# Patient Record
Sex: Female | Born: 1945 | Race: White | Hispanic: No | Smoking: Never smoker
Health system: Southern US, Community
[De-identification: ages and names within clinical notes are randomized; demographics above are authoritative.]

## PROBLEM LIST (undated history)

## (undated) DIAGNOSIS — J3089 Other allergic rhinitis: Secondary | ICD-10-CM

## (undated) DIAGNOSIS — Z923 Personal history of irradiation: Secondary | ICD-10-CM

## (undated) DIAGNOSIS — I10 Essential (primary) hypertension: Secondary | ICD-10-CM

## (undated) DIAGNOSIS — Z8041 Family history of malignant neoplasm of ovary: Secondary | ICD-10-CM

## (undated) DIAGNOSIS — E119 Type 2 diabetes mellitus without complications: Secondary | ICD-10-CM

## (undated) DIAGNOSIS — C541 Malignant neoplasm of endometrium: Secondary | ICD-10-CM

## (undated) DIAGNOSIS — Z973 Presence of spectacles and contact lenses: Secondary | ICD-10-CM

## (undated) DIAGNOSIS — E538 Deficiency of other specified B group vitamins: Secondary | ICD-10-CM

## (undated) DIAGNOSIS — H269 Unspecified cataract: Secondary | ICD-10-CM

## (undated) DIAGNOSIS — E039 Hypothyroidism, unspecified: Secondary | ICD-10-CM

## (undated) DIAGNOSIS — E785 Hyperlipidemia, unspecified: Secondary | ICD-10-CM

## (undated) DIAGNOSIS — Z1379 Encounter for other screening for genetic and chromosomal anomalies: Principal | ICD-10-CM

## (undated) DIAGNOSIS — Z86718 Personal history of other venous thrombosis and embolism: Secondary | ICD-10-CM

## (undated) DIAGNOSIS — Z800 Family history of malignant neoplasm of digestive organs: Secondary | ICD-10-CM

## (undated) DIAGNOSIS — E559 Vitamin D deficiency, unspecified: Secondary | ICD-10-CM

## (undated) DIAGNOSIS — K429 Umbilical hernia without obstruction or gangrene: Secondary | ICD-10-CM

## (undated) DIAGNOSIS — K219 Gastro-esophageal reflux disease without esophagitis: Secondary | ICD-10-CM

## (undated) HISTORY — DX: Family history of malignant neoplasm of digestive organs: Z80.0

## (undated) HISTORY — DX: Encounter for other screening for genetic and chromosomal anomalies: Z13.79

## (undated) HISTORY — DX: Unspecified cataract: H26.9

## (undated) HISTORY — DX: Personal history of irradiation: Z92.3

## (undated) HISTORY — DX: Essential (primary) hypertension: I10

## (undated) HISTORY — DX: Vitamin D deficiency, unspecified: E55.9

## (undated) HISTORY — DX: Family history of malignant neoplasm of ovary: Z80.41

## (undated) HISTORY — DX: Hyperlipidemia, unspecified: E78.5

---

## 1998-10-03 ENCOUNTER — Other Ambulatory Visit: Admission: RE | Admit: 1998-10-03 | Discharge: 1998-10-03 | Payer: Self-pay | Admitting: Family Medicine

## 2003-07-02 HISTORY — PX: LAPAROSCOPIC CHOLECYSTECTOMY: SUR755

## 2007-07-02 HISTORY — PX: BREAST BIOPSY: SHX20

## 2009-06-02 ENCOUNTER — Encounter (INDEPENDENT_AMBULATORY_CARE_PROVIDER_SITE_OTHER): Payer: Self-pay | Admitting: Orthopedic Surgery

## 2009-06-02 ENCOUNTER — Ambulatory Visit: Payer: Self-pay | Admitting: Surgery

## 2009-06-02 ENCOUNTER — Ambulatory Visit: Admission: RE | Admit: 2009-06-02 | Discharge: 2009-06-02 | Payer: Self-pay | Admitting: Orthopedic Surgery

## 2009-06-14 ENCOUNTER — Ambulatory Visit: Payer: Self-pay | Admitting: Vascular Surgery

## 2009-07-01 HISTORY — PX: VEIN SURGERY: SHX48

## 2009-09-15 ENCOUNTER — Ambulatory Visit: Payer: Self-pay | Admitting: Vascular Surgery

## 2009-09-27 ENCOUNTER — Ambulatory Visit: Payer: Self-pay | Admitting: Vascular Surgery

## 2009-10-06 ENCOUNTER — Ambulatory Visit: Payer: Self-pay | Admitting: Vascular Surgery

## 2010-11-13 NOTE — Assessment & Plan Note (Signed)
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  She has had no marked  progression in the hemosiderin deposit and changes of chronic venous  insufficiency in her right pretibial area.  She reports that she  continues to have discomfort despite the compression garment used and  has been very difficult for her to wear compression garments.  These  cause some discomfort and do not fit her well due to her obesity.  She  reports that housework with cooking and other chores is difficult due to  leg pain and swelling.  She also has difficulty caring to her husband,  who has had recent severe health issues due to leg pain and swelling  when being up and around and also makes automobile travel difficulty due  to pain and swelling with prolonged riding.   PHYSICAL EXAMINATION:  Unchanged.  Her health history is unchanged as  well.  Blood pressure today is 152/86, pulse 84, respirations 18.  She  is in no acute distress.  Musculoskeletal:  Without major deformities or  cyanosis.  Neurologic without focal deficits or paresthesias.  She does  have palpable dorsalis pedis pulses.   I re-imaged her vein with SonoSite Ultrasound, again showed a markedly  enlarged saphenous vein feeding into the varicosities in her calf.   I have recommend that we proceed with laser ablation of her right great  saphenous vein and stab phlebectomy of her multiple tributary  varicosities for her symptom relief, since she has clearly failed  conservative treatment.  She wishes to schedule this at her earliest  convenience.     Larina Earthly, M.D.  Electronically Signed   TFE/MEDQ  D:  09/15/2009  T:  09/18/2009  Job:  [id number]   cc:   Marlowe Kays, M.D.

## 2010-11-13 NOTE — Consult Note (Signed)
NEW PATIENT CONSULTATION   Alexandria Irwin, Alexandria Irwin  DOB:  11-07-1945                                       06/14/2009  CHART#:[id number]   The patient presents today for evaluation of pain in her right leg.  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  She does have a history of  superficial thrombophlebitis in her pretibial area approximately 10  years ago.  She has had progressive changes over the past several years  and reports that she does now have pain specifically over these areas.  She does not have any history of bleeding.  She does have swelling and  pain with prolonged standing.  She does have some arthritic difficulty  as well.   PAST MEDICAL HISTORY:  Significant for non-insulin-dependent diabetes,  hypothyroidism and hypertension.   PAST SURGICAL HISTORY:  Significant for cholecystectomy.   ALLERGIES:  Sulfa and Neosporin.   She does have discomfort with prolonged standing, specifically over  these varicosities and also an achy sensation from her knee distally.   FAMILY HISTORY:  Significant for extensive varicosities in her mother.   SOCIAL HISTORY:  She is married with 4 children.  She does not smoke or  drink alcohol.   REVIEW OF SYSTEMS:  Her weight is reported at 200 pounds.  She is 5 feet  3 inches tall.  She denies any weight loss or weight gain or change in  appetite.  CARDIAC:  Negative.  PULMONARY:  Negative.  GI:  Negative.  GU:  Negative.  VASCULAR:  Positive only for thrombophlebitis in the past.  NEUROLOGICAL:  Negative.  MUSCULOSKELETAL:  Negative.  PSYCHIATRIC:  Negative.  ENT:  Positive for change in her eyesight with dryness in her eyes.  No hematologic or skin issues.   PHYSICAL EXAMINATION:  A well-developed, moderately obese white female  appearing stated age of 65.  Blood pressure is 132/80, pulse 78,  respirations 14.  Her radial and dorsalis pedis pulses  are 2+  bilaterally.  She is in no acute distress.  HEENT:  Pupils equal, round  and reactive to light.  Extraocular movements are intact.  Neck:  Without JVD or cervical adenopathy.  Her musculoskeletal shows no major  deformities or cyanosis.  She does have marked saphenous vein tributary  varicosities throughout her calf with hemosiderin deposit in the  pretibial area.  Neurologically she is grossly intact.  Skin is without  ulcers or rashes other than the hemosiderin deposit.   She underwent noninvasive vascular laboratory study in our office and  this reveals gross reflux in her right great saphenous vein, also some  reflux in her right small saphenous vein.  She does not have any  evidence of deep venous thrombosis.  I discussed the significance of  this with the patient.  I reassured her this was not a limb threatening  issue.  I have recommended that we begin with graduated compression  garments for conservative treatment of her venous hypertension.  She was  fitted today with thigh high 20-30 mmHg compression and will begin the  use of these.  We will see her back in 3 months for continued  discussion.  I did explain the potential option for ablation of her  great saphenous vein should she have failure of  conservative treatment.  We will see her again in 3 months.     Larina Earthly, M.D.  Electronically Signed   TFE/MEDQ  D:  06/14/2009  T:  06/15/2009  Job:  [id number]   cc:   Marlowe Kays, M.D.  [facility]  Paulene Floor, FNP

## 2010-11-13 NOTE — Procedures (Signed)
LOWER EXTREMITY VENOUS REFLUX EXAM   INDICATION:  Right lower extremity varicose vein.   EXAM:  Using color-flow imaging and pulse Doppler spectral analysis, the  right common femoral, superficial femoral, popliteal, posterior tibial,  greater and lesser saphenous veins are evaluated.  There is evidence  suggesting deep venous insufficiency in the right lower extremity.   The right saphenofemoral junction is not competent with reflux of >500  milliseconds. The right GSV is not competent with reflux of >500  milliseconds with the caliber as described below.   The right proximal short saphenous vein demonstrates incompetency in the  mid distal calf and is tortuous.   GSV Diameter (used if found to be incompetent only)                                            Right    Left  Proximal Greater Saphenous Vein           0.9 cm   cm  Proximal-to-mid-thigh                     cm       cm  Mid thigh                                 0.89 cm  cm  Mid-distal thigh                          cm       cm  Distal thigh                              0.93 cm  cm  Knee                                      0.7 cm   cm   IMPRESSION:  1. Right greater saphenous vein reflux with >500 milliseconds is      identified with the caliber ranging from 0.7 cm to 0.99 cm knee to      groin.  2. In the distal thigh and proximal calf there appears to be frozen      valves in the right greater saphenous vein.  3. The right greater saphenous vein is not aneurysmal.  4. The right greater saphenous vein is not tortuous.  5. The deep venous system is not competent with reflux of >500      milliseconds.  6. The right lesser saphenous vein is not competent with reflux of      >500 milliseconds in the mid / distal calf.   ___________________________________________  Larina Earthly, M.D.   AS/MEDQ  D:  06/14/2009  T:  06/14/2009  Job:  [id number]

## 2010-11-13 NOTE — Assessment & Plan Note (Signed)
OFFICE VISIT   [name], Alexandria Irwin  DOB:  11-Jun-1946                                       09/27/2009  CHART#:[id number]   The patient underwent uneventful right laser ablation of great saphenous  vein from below the knee to the saphenofemoral junction and stab  phlebectomy of greater than 10 tributary varicosities in her medial and  posterior calf.  She had no immediate complications and was discharged  to home and will be seen again in 1 week for followup and duplex  evaluation.     Larina Earthly, M.D.  Electronically Signed   TFE/MEDQ  D:  09/27/2009  T:  09/28/2009  Job:  [id number]

## 2010-11-13 NOTE — Procedures (Signed)
DUPLEX DEEP VENOUS EXAM - LOWER EXTREMITY   INDICATION:  Right greater saphenous vein strip.   HISTORY:  Edema:  No.  Trauma/Surgery:  She did have the right greater saphenous vein stripped  a week ago.  Pain:  PE:  Previous DVT:  Yes.  Anticoagulants:  No.  Other:   DUPLEX EXAM:                CFV   SFV   PopV  PTV    GSV                R  L  R  L  R  L  R   L  R     L  Thrombosis    0  0  0     0     0      Closed  Spontaneous   +  +  +     +     +  Phasic        +  +  +     +     +  Augmentation  +  +  +     +     +  Compressible  +  +  +     +     +  Competent     +  +  +     +     +   Legend:  + - yes  o - no  p - partial  D - decreased   IMPRESSION:  1. No evidence of deep venous thrombosis in right leg.  2. The right greater saphenous vein was lasered from the groin to the      knee and appears to be closed.  3. The phlebectomy sites are stable.    _____________________________  Larina Earthly, M.D.   NT/MEDQ  D:  10/06/2009  T:  10/06/2009  Job:  [id number]

## 2010-11-13 NOTE — Assessment & Plan Note (Signed)
OFFICE VISIT   Alexandria, Irwin  DOB:  [birth year]                                       10/06/2009  CHART#:[id number]   Patient presents today in 1-week followup of her right great saphenous  vein laser ablation and stab phlebectomy on 03/30.  She has done quite  well with the usual amount of mild bruising and soreness in her medial  thigh.  Her phlebectomy sites are all healing quite nicely.   She has undergone repeat venous duplex in our office, and this reveals  no evidence of injury to her deep system and ablation of her right great  saphenous vein.  I am quite pleased with her result, as is patient.  I  plan to see her again on an as-needed basis.     Larina Earthly, M.D.  Electronically Signed   TFE/MEDQ  D:  10/06/2009  T:  10/10/2009  Job:  [id number]   cc:   Marlowe Kays, M.D.  [facility]

## 2011-07-02 HISTORY — PX: FOOT GANGLION EXCISION: SHX1660

## 2011-07-03 DIAGNOSIS — M999 Biomechanical lesion, unspecified: Secondary | ICD-10-CM | POA: Diagnosis not present

## 2011-07-03 DIAGNOSIS — M5137 Other intervertebral disc degeneration, lumbosacral region: Secondary | ICD-10-CM | POA: Diagnosis not present

## 2011-07-03 DIAGNOSIS — M9981 Other biomechanical lesions of cervical region: Secondary | ICD-10-CM | POA: Diagnosis not present

## 2011-07-04 DIAGNOSIS — M9981 Other biomechanical lesions of cervical region: Secondary | ICD-10-CM | POA: Diagnosis not present

## 2011-07-04 DIAGNOSIS — M5137 Other intervertebral disc degeneration, lumbosacral region: Secondary | ICD-10-CM | POA: Diagnosis not present

## 2011-07-04 DIAGNOSIS — M999 Biomechanical lesion, unspecified: Secondary | ICD-10-CM | POA: Diagnosis not present

## 2011-07-08 DIAGNOSIS — M5137 Other intervertebral disc degeneration, lumbosacral region: Secondary | ICD-10-CM | POA: Diagnosis not present

## 2011-07-08 DIAGNOSIS — M999 Biomechanical lesion, unspecified: Secondary | ICD-10-CM | POA: Diagnosis not present

## 2011-07-08 DIAGNOSIS — M9981 Other biomechanical lesions of cervical region: Secondary | ICD-10-CM | POA: Diagnosis not present

## 2011-07-10 DIAGNOSIS — M999 Biomechanical lesion, unspecified: Secondary | ICD-10-CM | POA: Diagnosis not present

## 2011-07-10 DIAGNOSIS — M9981 Other biomechanical lesions of cervical region: Secondary | ICD-10-CM | POA: Diagnosis not present

## 2011-07-10 DIAGNOSIS — M5137 Other intervertebral disc degeneration, lumbosacral region: Secondary | ICD-10-CM | POA: Diagnosis not present

## 2011-07-11 DIAGNOSIS — M5137 Other intervertebral disc degeneration, lumbosacral region: Secondary | ICD-10-CM | POA: Diagnosis not present

## 2011-07-11 DIAGNOSIS — M999 Biomechanical lesion, unspecified: Secondary | ICD-10-CM | POA: Diagnosis not present

## 2011-07-11 DIAGNOSIS — M9981 Other biomechanical lesions of cervical region: Secondary | ICD-10-CM | POA: Diagnosis not present

## 2011-07-15 DIAGNOSIS — M79609 Pain in unspecified limb: Secondary | ICD-10-CM | POA: Diagnosis not present

## 2011-07-15 DIAGNOSIS — N390 Urinary tract infection, site not specified: Secondary | ICD-10-CM | POA: Diagnosis not present

## 2011-07-16 DIAGNOSIS — M6740 Ganglion, unspecified site: Secondary | ICD-10-CM | POA: Diagnosis not present

## 2011-07-16 DIAGNOSIS — M79609 Pain in unspecified limb: Secondary | ICD-10-CM | POA: Diagnosis not present

## 2011-07-18 DIAGNOSIS — M5137 Other intervertebral disc degeneration, lumbosacral region: Secondary | ICD-10-CM | POA: Diagnosis not present

## 2011-07-18 DIAGNOSIS — M999 Biomechanical lesion, unspecified: Secondary | ICD-10-CM | POA: Diagnosis not present

## 2011-07-18 DIAGNOSIS — M9981 Other biomechanical lesions of cervical region: Secondary | ICD-10-CM | POA: Diagnosis not present

## 2011-08-06 DIAGNOSIS — M6740 Ganglion, unspecified site: Secondary | ICD-10-CM | POA: Diagnosis not present

## 2011-09-03 DIAGNOSIS — M79609 Pain in unspecified limb: Secondary | ICD-10-CM | POA: Diagnosis not present

## 2011-09-03 DIAGNOSIS — M6740 Ganglion, unspecified site: Secondary | ICD-10-CM | POA: Diagnosis not present

## 2011-09-24 DIAGNOSIS — N390 Urinary tract infection, site not specified: Secondary | ICD-10-CM | POA: Diagnosis not present

## 2011-09-24 DIAGNOSIS — E785 Hyperlipidemia, unspecified: Secondary | ICD-10-CM | POA: Diagnosis not present

## 2011-09-24 DIAGNOSIS — I10 Essential (primary) hypertension: Secondary | ICD-10-CM | POA: Diagnosis not present

## 2011-09-24 DIAGNOSIS — E559 Vitamin D deficiency, unspecified: Secondary | ICD-10-CM | POA: Diagnosis not present

## 2011-09-24 DIAGNOSIS — R7989 Other specified abnormal findings of blood chemistry: Secondary | ICD-10-CM | POA: Diagnosis not present

## 2011-09-24 DIAGNOSIS — E039 Hypothyroidism, unspecified: Secondary | ICD-10-CM | POA: Diagnosis not present

## 2011-09-24 DIAGNOSIS — E119 Type 2 diabetes mellitus without complications: Secondary | ICD-10-CM | POA: Diagnosis not present

## 2011-10-01 DIAGNOSIS — M6740 Ganglion, unspecified site: Secondary | ICD-10-CM | POA: Diagnosis not present

## 2011-10-01 DIAGNOSIS — M79609 Pain in unspecified limb: Secondary | ICD-10-CM | POA: Diagnosis not present

## 2011-10-10 DIAGNOSIS — Z6839 Body mass index (BMI) 39.0-39.9, adult: Secondary | ICD-10-CM | POA: Diagnosis not present

## 2011-10-10 DIAGNOSIS — E119 Type 2 diabetes mellitus without complications: Secondary | ICD-10-CM | POA: Diagnosis not present

## 2011-10-10 DIAGNOSIS — Z7982 Long term (current) use of aspirin: Secondary | ICD-10-CM | POA: Diagnosis not present

## 2011-10-10 DIAGNOSIS — I10 Essential (primary) hypertension: Secondary | ICD-10-CM | POA: Diagnosis not present

## 2011-10-10 DIAGNOSIS — E669 Obesity, unspecified: Secondary | ICD-10-CM | POA: Diagnosis not present

## 2011-10-10 DIAGNOSIS — M6740 Ganglion, unspecified site: Secondary | ICD-10-CM | POA: Diagnosis not present

## 2011-10-10 DIAGNOSIS — Z79899 Other long term (current) drug therapy: Secondary | ICD-10-CM | POA: Diagnosis not present

## 2011-10-10 DIAGNOSIS — E039 Hypothyroidism, unspecified: Secondary | ICD-10-CM | POA: Diagnosis not present

## 2011-11-08 DIAGNOSIS — N390 Urinary tract infection, site not specified: Secondary | ICD-10-CM | POA: Diagnosis not present

## 2011-12-26 DIAGNOSIS — E039 Hypothyroidism, unspecified: Secondary | ICD-10-CM | POA: Diagnosis not present

## 2011-12-26 DIAGNOSIS — E785 Hyperlipidemia, unspecified: Secondary | ICD-10-CM | POA: Diagnosis not present

## 2011-12-26 DIAGNOSIS — N390 Urinary tract infection, site not specified: Secondary | ICD-10-CM | POA: Diagnosis not present

## 2011-12-26 DIAGNOSIS — I10 Essential (primary) hypertension: Secondary | ICD-10-CM | POA: Diagnosis not present

## 2011-12-26 DIAGNOSIS — E119 Type 2 diabetes mellitus without complications: Secondary | ICD-10-CM | POA: Diagnosis not present

## 2012-02-28 DIAGNOSIS — N390 Urinary tract infection, site not specified: Secondary | ICD-10-CM | POA: Diagnosis not present

## 2012-03-30 DIAGNOSIS — E119 Type 2 diabetes mellitus without complications: Secondary | ICD-10-CM | POA: Diagnosis not present

## 2012-03-30 DIAGNOSIS — I10 Essential (primary) hypertension: Secondary | ICD-10-CM | POA: Diagnosis not present

## 2012-03-30 DIAGNOSIS — E785 Hyperlipidemia, unspecified: Secondary | ICD-10-CM | POA: Diagnosis not present

## 2012-03-30 DIAGNOSIS — N390 Urinary tract infection, site not specified: Secondary | ICD-10-CM | POA: Diagnosis not present

## 2012-03-30 DIAGNOSIS — E039 Hypothyroidism, unspecified: Secondary | ICD-10-CM | POA: Diagnosis not present

## 2012-04-20 DIAGNOSIS — Z23 Encounter for immunization: Secondary | ICD-10-CM | POA: Diagnosis not present

## 2012-06-04 DIAGNOSIS — H524 Presbyopia: Secondary | ICD-10-CM | POA: Diagnosis not present

## 2012-06-04 DIAGNOSIS — H2510 Age-related nuclear cataract, unspecified eye: Secondary | ICD-10-CM | POA: Diagnosis not present

## 2012-06-04 DIAGNOSIS — H00029 Hordeolum internum unspecified eye, unspecified eyelid: Secondary | ICD-10-CM | POA: Diagnosis not present

## 2012-06-04 DIAGNOSIS — E119 Type 2 diabetes mellitus without complications: Secondary | ICD-10-CM | POA: Diagnosis not present

## 2012-07-02 DIAGNOSIS — E559 Vitamin D deficiency, unspecified: Secondary | ICD-10-CM | POA: Diagnosis not present

## 2012-07-02 DIAGNOSIS — I10 Essential (primary) hypertension: Secondary | ICD-10-CM | POA: Diagnosis not present

## 2012-07-02 DIAGNOSIS — E039 Hypothyroidism, unspecified: Secondary | ICD-10-CM | POA: Diagnosis not present

## 2012-07-02 DIAGNOSIS — E785 Hyperlipidemia, unspecified: Secondary | ICD-10-CM | POA: Diagnosis not present

## 2012-07-02 DIAGNOSIS — E119 Type 2 diabetes mellitus without complications: Secondary | ICD-10-CM | POA: Diagnosis not present

## 2012-09-18 ENCOUNTER — Telehealth: Payer: Self-pay | Admitting: General Practice

## 2012-09-18 NOTE — Telephone Encounter (Signed)
appt made

## 2012-09-18 NOTE — Telephone Encounter (Signed)
uti wants to be seen today

## 2012-09-19 ENCOUNTER — Ambulatory Visit (INDEPENDENT_AMBULATORY_CARE_PROVIDER_SITE_OTHER): Payer: Medicare Other | Admitting: Family Medicine

## 2012-09-19 VITALS — BP 118/62 | HR 51 | Temp 97.0°F | Ht 63.0 in | Wt 210.0 lb

## 2012-09-19 DIAGNOSIS — R350 Frequency of micturition: Secondary | ICD-10-CM

## 2012-09-19 DIAGNOSIS — N390 Urinary tract infection, site not specified: Secondary | ICD-10-CM | POA: Diagnosis not present

## 2012-09-19 DIAGNOSIS — Z780 Asymptomatic menopausal state: Secondary | ICD-10-CM | POA: Diagnosis not present

## 2012-09-19 LAB — POCT URINALYSIS DIPSTICK
Bilirubin, UA: NEGATIVE
Glucose, UA: NEGATIVE
Nitrite, UA: NEGATIVE
Protein, UA: NEGATIVE
Spec Grav, UA: <=1.005
Urobilinogen, UA: NEGATIVE
pH, UA: 6.5

## 2012-09-19 MED ORDER — CIPROFLOXACIN HCL 500 MG PO TABS: 500.0000 mg | ORAL_TABLET | Freq: Two times a day (BID) | ORAL | Status: DC

## 2012-09-19 NOTE — Progress Notes (Signed)
Subjective:     Patient ID: Devonda Pequignot, female   DOB: [birth year], 67 y.o.   MRN: [id number]  HPI Patient comes in the Saturday morning urgent clinic here at [facility]. She's had the symptoms of UTI almost on a monthly basis as soon as she finishes antibiotics it starts again. On reviewing her chart they have been multiple visits for frequency or dysuria in the past year. No fever or chills. No vaginal discharge. She does give a history of early menopause at 40-35 years old. No flank pain. No history of kidney stones No history of GYN malignancy.  Past Medical History  Diagnosis Date  . Diabetes mellitus without complication   . Hypertension   . Thyroid disease   . Hyperlipidemia    Past Surgical History  Procedure Laterality Date  . Cholecystectomy    . Vein surgery     History   Social History  . Marital Status: Married    Spouse Name: N/A    Number of Children: N/A  . Years of Education: N/A   Occupational History  . Not on file.   Social History Main Topics  . Smoking status: Never Smoker   . Smokeless tobacco: Not on file  . Alcohol Use: No  . Drug Use: No  . Sexually Active: Not on file   Other Topics Concern  . Not on file   Social History Narrative  . No narrative on file   Family History  Problem Relation Age of Onset  . Cancer Mother     ovarian   No current outpatient prescriptions on file prior to visit.   No current facility-administered medications on file prior to visit.   Allergies  Allergen Reactions  . Ace Inhibitors   . Neosporin (Neomycin-Bacitracin Zn-Polymyx)   . Norvasc (Amlodipine)   . Sulfa Antibiotics    Immunization History  Administered Date(s) Administered  . Influenza Whole 08/02/2011   Prior to Admission medications   Medication Sig Start Date End Date Taking? Authorizing Provider  aspirin 325 MG tablet Take 325 mg by mouth daily.   Yes Historical Provider, MD  B Complex Vitamins (VITAMIN  B-COMPLEX PO) Take 1 tablet by mouth daily.   Yes Historical Provider, MD  Calcium Citrate-Vitamin D ([facility] CALCIUM CITRATE +D PO) Take 1 tablet by mouth daily.   Yes Historical Provider, MD  cholecalciferol (VITAMIN D) 1000 UNITS tablet Take 1,000 Units by mouth 2 (two) times daily.   Yes Historical Provider, MD  cloNIDine (CATAPRES) 0.1 MG tablet Take 0.1 mg by mouth 2 (two) times daily.   Yes Historical Provider, MD  furosemide (LASIX) 40 MG tablet Take 40 mg by mouth daily.   Yes Historical Provider, MD  levothyroxine (SYNTHROID, LEVOTHROID) 88 MCG tablet Take 88 mcg by mouth daily.   Yes Historical Provider, MD  losartan (COZAAR) 100 MG tablet Take 100 mg by mouth daily.   Yes Historical Provider, MD  Magnesium 250 MG TABS Take 1 tablet by mouth daily.   Yes Historical Provider, MD  metFORMIN (GLUCOPHAGE) 500 MG tablet Take 500 mg by mouth 2 (two) times daily with a meal.   Yes Historical Provider, MD  metoprolol (LOPRESSOR) 50 MG tablet Take 50 mg by mouth 2 (two) times daily.   Yes Historical Provider, MD  simvastatin (ZOCOR) 40 MG tablet Take 40 mg by mouth every evening.   Yes Historical Provider, MD  vitamin C (ASCORBIC ACID) 500 MG tablet Take 500 mg by mouth daily.  Yes Historical Provider, MD  ciprofloxacin (CIPRO) 500 MG tablet Take 1 tablet (500 mg total) by mouth 2 (two) times daily. 09/19/12   Ileana Ladd, MD    Review of Systems Review of all other systems was negative.     Objective:   Physical Exam On examination she appeared in good health and spirits. Well developed, well nourished. Obese Vital signs as documented. BP 118/62  Pulse 51  Temp(Src) 97 F (36.1 C) (Oral)  Wt 210 lb (95.255 kg)  Skin warm and dry and without overt rashes. Head & Neck without JVD. Lungs clear.  Heart exam notable for regular rhythm, normal sounds and absence of murmurs, rubs or gallops. Abdomen unremarkable and without evidence of organomegaly, masses, or abdominal aortic  enlargement.  Breast exam: not performed. Gyn Exam: Not performed. External Genitalia: Not performed Vagina:: Cervix: Uterus: Adnexae: R/V: Extremities nonedematous.    Assessment:     Frequency of urination - Plan: POCT urinalysis dipstick, Urine culture  UTI (urinary tract infection)     she does have a recurrent urinary tract infection. She may need suppressive therapy. Especially if the bacteria is growing in urine culture. However if the urine culture comes back negative as it has done on several occasions. Then 1 should consider that her problem may lie in atrophic vaginitis. And if indicated vaginal estrogen cream may improve her symptomatology. This may require a gynecologic exam in the near future. Discussed this with the patient. And she does see Claris Che here at [facility]. And was scheduled that if indicated. Plan:      Results for orders placed in visit on 09/19/12  POCT URINALYSIS DIPSTICK      Result Value Range   Color, UA yellow     Clarity, UA clear     Glucose, UA negative     Bilirubin, UA negative     Ketones, UA moderate     Spec Grav, UA <=1.005     Blood, UA moderate     pH, UA 6.5     Protein, UA negative     Urobilinogen, UA negative     Nitrite, UA negative     Leukocytes, UA moderate (2+)     Urine culture was sent out. Cipro 500 mg twice a day for 10 days was prescribed. Increase fluids await follow up of the urine culture.  Zarek Relph P. Modesto Charon, M.D.

## 2012-09-23 LAB — URINE CULTURE
Colony Count: NO GROWTH
Organism ID, Bacteria: NO GROWTH

## 2012-11-11 ENCOUNTER — Other Ambulatory Visit: Payer: Self-pay

## 2012-11-11 MED ORDER — CLONIDINE HCL 0.1 MG PO TABS: 0.1000 mg | ORAL_TABLET | Freq: Two times a day (BID) | ORAL | Status: DC

## 2012-11-11 MED ORDER — METFORMIN HCL 500 MG PO TABS: 500.0000 mg | ORAL_TABLET | Freq: Two times a day (BID) | ORAL | Status: DC

## 2012-11-11 MED ORDER — FUROSEMIDE 40 MG PO TABS: 40.0000 mg | ORAL_TABLET | Freq: Every day | ORAL | Status: DC

## 2012-11-11 MED ORDER — METOPROLOL TARTRATE 50 MG PO TABS: 50.0000 mg | ORAL_TABLET | Freq: Two times a day (BID) | ORAL | Status: DC

## 2012-11-20 ENCOUNTER — Telehealth: Payer: Self-pay | Admitting: Family Medicine

## 2012-11-20 ENCOUNTER — Encounter: Payer: Self-pay | Admitting: General Practice

## 2012-11-20 ENCOUNTER — Ambulatory Visit (INDEPENDENT_AMBULATORY_CARE_PROVIDER_SITE_OTHER): Payer: Medicare Other | Admitting: General Practice

## 2012-11-20 VITALS — BP 117/73 | HR 56 | Temp 97.6°F | Ht 61.0 in | Wt 208.0 lb

## 2012-11-20 DIAGNOSIS — N390 Urinary tract infection, site not specified: Secondary | ICD-10-CM

## 2012-11-20 DIAGNOSIS — R350 Frequency of micturition: Secondary | ICD-10-CM | POA: Diagnosis not present

## 2012-11-20 LAB — POCT URINALYSIS DIPSTICK
Bilirubin, UA: NEGATIVE
Ketones, UA: NEGATIVE
pH, UA: 7.5

## 2012-11-20 LAB — POCT UA - MICROSCOPIC ONLY
Bacteria, U Microscopic: NEGATIVE
Casts, Ur, LPF, POC: NEGATIVE

## 2012-11-20 MED ORDER — CIPROFLOXACIN HCL 500 MG PO TABS: 500.0000 mg | ORAL_TABLET | Freq: Two times a day (BID) | ORAL | Status: DC

## 2012-11-20 NOTE — Telephone Encounter (Signed)
Pt aware and appt made.

## 2012-11-20 NOTE — Progress Notes (Signed)
  Subjective:    Patient ID: Alexandria Irwin, female    DOB: December 22, 1945, 67 y.o.   MRN: [id number]  Urinary Tract Infection  This is a new problem. The current episode started 1 to 4 weeks ago. The problem occurs every urination. The problem has been gradually worsening. The quality of the pain is described as aching and burning. The pain is at a severity of 5/10. There has been no fever. She is not sexually active. There is no history of pyelonephritis. Associated symptoms include frequency and urgency. Pertinent negatives include no chills, discharge, flank pain or hematuria. Her past medical history is significant for recurrent UTIs. There is no history of kidney stones or urinary stasis.      Review of Systems  Constitutional: Negative for fever and chills.  Respiratory: Negative for chest tightness and shortness of breath.   Cardiovascular: Negative for chest pain and palpitations.  Gastrointestinal: Negative for abdominal pain.  Genitourinary: Positive for urgency and frequency. Negative for hematuria, flank pain and difficulty urinating.  Musculoskeletal: Negative for back pain.  Skin: Negative for rash.  Neurological: Negative for dizziness and headaches.  Psychiatric/Behavioral: Negative.        Objective:   Physical Exam  Constitutional: She is oriented to person, place, and time. She appears well-developed and well-nourished.  Cardiovascular: Normal rate, regular rhythm and normal heart sounds.   Pulmonary/Chest: Effort normal and breath sounds normal. No respiratory distress. She exhibits no tenderness.  Abdominal: Soft. Bowel sounds are normal. She exhibits no distension and no mass. There is no tenderness. There is no rebound and no guarding.  Neurological: She is alert and oriented to person, place, and time.  Skin: Skin is warm and dry.  Psychiatric: She has a normal mood and affect.          Assessment & Plan:  Urinary frequency - Plan: POCT urinalysis dipstick, POCT  UA - Microscopic Only  UTI (urinary tract infection) - Plan: ciprofloxacin (CIPRO) 500 MG tablet  Increase fluid intake AZO over the counter X2 days Frequent voiding Proper perineal hygiene RTO prn Culture pending Patient verbalized understanding Coralie Keens, FNP-C

## 2012-11-20 NOTE — Patient Instructions (Addendum)
Urinary Tract Infection  Urinary tract infections (UTIs) can develop anywhere along your urinary tract. Your urinary tract is your body's drainage system for removing wastes and extra water. Your urinary tract includes two kidneys, two ureters, a bladder, and a urethra. Your kidneys are a pair of bean-shaped organs. Each kidney is about the size of your fist. They are located below your ribs, one on each side of your spine.  CAUSES  Infections are caused by microbes, which are microscopic organisms, including fungi, viruses, and bacteria. These organisms are so small that they can only be seen through a microscope. Bacteria are the microbes that most commonly cause UTIs.  SYMPTOMS   Symptoms of UTIs may vary by age and gender of the patient and by the location of the infection. Symptoms in young women typically include a frequent and intense urge to urinate and a painful, burning feeling in the bladder or urethra during urination. Older women and men are more likely to be tired, shaky, and weak and have muscle aches and abdominal pain. A fever may mean the infection is in your kidneys. Other symptoms of a kidney infection include pain in your back or sides below the ribs, nausea, and vomiting.  DIAGNOSIS  To diagnose a UTI, your caregiver will ask you about your symptoms. Your caregiver also will ask to provide a urine sample. The urine sample will be tested for bacteria and white blood cells. White blood cells are made by your body to help fight infection.  TREATMENT   Typically, UTIs can be treated with medication. Because most UTIs are caused by a bacterial infection, they usually can be treated with the use of antibiotics. The choice of antibiotic and length of treatment depend on your symptoms and the type of bacteria causing your infection.  HOME CARE INSTRUCTIONS   If you were prescribed antibiotics, take them exactly as your caregiver instructs you. Finish the medication even if you feel better after you  have only taken some of the medication.   Drink enough water and fluids to keep your urine clear or pale yellow.   Avoid caffeine, tea, and carbonated beverages. They tend to irritate your bladder.   Empty your bladder often. Avoid holding urine for long periods of time.   Empty your bladder before and after sexual intercourse.   After a bowel movement, women should cleanse from front to back. Use each tissue only once.  SEEK MEDICAL CARE IF:    You have back pain.   You develop a fever.   Your symptoms do not begin to resolve within 3 days.  SEEK IMMEDIATE MEDICAL CARE IF:    You have severe back pain or lower abdominal pain.   You develop chills.   You have nausea or vomiting.   You have continued burning or discomfort with urination.  MAKE SURE YOU:    Understand these instructions.   Will watch your condition.   Will get help right away if you are not doing well or get worse.  Document Released: 03/27/2005 Document Revised: 12/17/2011 Document Reviewed: 07/26/2011  Exit[employer] Patient Information 2014 [employer].

## 2012-12-01 ENCOUNTER — Other Ambulatory Visit: Payer: Self-pay | Admitting: *Deleted

## 2012-12-01 MED ORDER — SIMVASTATIN 40 MG PO TABS: 40.0000 mg | ORAL_TABLET | Freq: Every evening | ORAL | Status: DC

## 2012-12-03 ENCOUNTER — Ambulatory Visit (INDEPENDENT_AMBULATORY_CARE_PROVIDER_SITE_OTHER): Payer: Medicare Other | Admitting: Physician Assistant

## 2012-12-03 ENCOUNTER — Encounter: Payer: Self-pay | Admitting: Physician Assistant

## 2012-12-03 ENCOUNTER — Encounter (INDEPENDENT_AMBULATORY_CARE_PROVIDER_SITE_OTHER): Payer: Medicare Other | Admitting: Vascular Surgery

## 2012-12-03 VITALS — BP 128/73 | HR 59 | Temp 98.2°F | Ht 61.0 in | Wt 210.0 lb

## 2012-12-03 DIAGNOSIS — R609 Edema, unspecified: Secondary | ICD-10-CM

## 2012-12-03 DIAGNOSIS — E785 Hyperlipidemia, unspecified: Secondary | ICD-10-CM

## 2012-12-03 DIAGNOSIS — E039 Hypothyroidism, unspecified: Secondary | ICD-10-CM

## 2012-12-03 DIAGNOSIS — M7989 Other specified soft tissue disorders: Secondary | ICD-10-CM | POA: Diagnosis not present

## 2012-12-03 DIAGNOSIS — E1159 Type 2 diabetes mellitus with other circulatory complications: Secondary | ICD-10-CM | POA: Insufficient documentation

## 2012-12-03 DIAGNOSIS — E119 Type 2 diabetes mellitus without complications: Secondary | ICD-10-CM | POA: Diagnosis not present

## 2012-12-03 DIAGNOSIS — E1169 Type 2 diabetes mellitus with other specified complication: Secondary | ICD-10-CM | POA: Insufficient documentation

## 2012-12-03 DIAGNOSIS — R600 Localized edema: Secondary | ICD-10-CM

## 2012-12-03 DIAGNOSIS — I10 Essential (primary) hypertension: Secondary | ICD-10-CM

## 2012-12-03 NOTE — Patient Instructions (Signed)

## 2012-12-03 NOTE — Progress Notes (Signed)
Subjective:     Patient ID: Alexandria Irwin, female   DOB: 1946-06-25, 67 y.o.   MRN: [id number]  HPI Pt with pain and swelling to the R lower leg Pt with prev hx of DVT and concerned about one again She has prev been given rx for TED hose but she never got it filled Denies numbness to the foot  Review of Systems  All other systems reviewed and are negative.       Objective:   Physical Exam  Nursing note and vitals reviewed.  Heart- RRR w/o M Lungs- CTA Lower ext- ++ pitting edema to the R lower leg to mid tib, early skin breakdown noted, + TTP post calf,good pulses distal    Assessment:     1. HTN (hypertension)   2. Diabetes   3. Unspecified hypothyroidism   4. Other and unspecified hyperlipidemia   5. Lower extremity edema        Plan:     [place] to r/o DVT Rx given for TED hose Keep up regular activities as tol F/U pending study

## 2012-12-10 ENCOUNTER — Other Ambulatory Visit: Payer: Self-pay | Admitting: Family Medicine

## 2012-12-10 ENCOUNTER — Telehealth: Payer: Self-pay | Admitting: Physician Assistant

## 2012-12-12 ENCOUNTER — Ambulatory Visit (INDEPENDENT_AMBULATORY_CARE_PROVIDER_SITE_OTHER): Payer: Medicare Other | Admitting: General Practice

## 2012-12-12 VITALS — BP 125/72 | HR 61 | Temp 98.0°F | Ht 61.0 in | Wt 210.0 lb

## 2012-12-12 DIAGNOSIS — L02419 Cutaneous abscess of limb, unspecified: Secondary | ICD-10-CM

## 2012-12-12 DIAGNOSIS — L03119 Cellulitis of unspecified part of limb: Secondary | ICD-10-CM | POA: Diagnosis not present

## 2012-12-12 DIAGNOSIS — L03115 Cellulitis of right lower limb: Secondary | ICD-10-CM

## 2012-12-12 MED ORDER — CEPHALEXIN 500 MG PO CAPS: 500.0000 mg | ORAL_CAPSULE | Freq: Two times a day (BID) | ORAL | Status: DC

## 2012-12-12 NOTE — Patient Instructions (Signed)
Cellulitis Cellulitis is an infection of the skin and the tissue beneath it. The infected area is usually red and tender. Cellulitis occurs most often in the arms and lower legs.  CAUSES  Cellulitis is caused by bacteria that enter the skin through cracks or cuts in the skin. The most common types of bacteria that cause cellulitis are Staphylococcus and Streptococcus. SYMPTOMS   Redness and warmth.  Swelling.  Tenderness or pain.  Fever. DIAGNOSIS  Your caregiver can usually determine what is wrong based on a physical exam. Blood tests may also be done. TREATMENT  Treatment usually involves taking an antibiotic medicine. HOME CARE INSTRUCTIONS   Take your antibiotics as directed. Finish them even if you start to feel better.  Keep the infected arm or leg elevated to reduce swelling.  Apply a warm cloth to the affected area up to 4 times per day to relieve pain.  Only take over-the-counter or prescription medicines for pain, discomfort, or fever as directed by your caregiver.  Keep all follow-up appointments as directed by your caregiver. SEEK MEDICAL CARE IF:   You notice red streaks coming from the infected area.  Your red area gets larger or turns dark in color.  Your bone or joint underneath the infected area becomes painful after the skin has healed.  Your infection returns in the same area or another area.  You notice a swollen bump in the infected area.  You develop new symptoms. SEEK IMMEDIATE MEDICAL CARE IF:   You have a fever.  You feel very sleepy.  You develop vomiting or diarrhea.  You have a general ill feeling (malaise) with muscle aches and pains. MAKE SURE YOU:   Understand these instructions.  Will watch your condition.  Will get help right away if you are not doing well or get worse. Document Released: 03/27/2005 Document Revised: 12/17/2011 Document Reviewed: 09/02/2011 Exit[employer] Patient Information 2014 [employer].  

## 2012-12-12 NOTE — Telephone Encounter (Signed)
Patient notified of results during appt on sat at after hours clinic

## 2012-12-12 NOTE — Progress Notes (Signed)
  Subjective:    Patient ID: Alexandria Irwin, female    DOB: August 10, 1945, 67 y.o.   MRN: [id number]  HPI Presents today with complaints of right lower leg swelling. Denies pain or drainage.     Review of Systems  Constitutional: Negative for fever and chills.  Respiratory: Negative for cough, chest tightness and shortness of breath.   Cardiovascular: Positive for leg swelling. Negative for chest pain and palpitations.       Right lower leg swelling  Genitourinary: Negative for dysuria, hematuria and difficulty urinating.  Neurological: Negative for dizziness, weakness and numbness.  Psychiatric/Behavioral: Negative.   All other systems reviewed and are negative.       Objective:   Physical Exam  Constitutional: She is oriented to person, place, and time. She appears well-developed and well-nourished.  Cardiovascular: Normal rate, regular rhythm and normal heart sounds.   Pulses:      Dorsalis pedis pulses are 1+ on the right side, and 1+ on the left side.  Pulmonary/Chest: Effort normal and breath sounds normal. No respiratory distress. She exhibits no tenderness.  Neurological: She is alert and oriented to person, place, and time.  Skin: Skin is warm and dry. There is erythema.  Erythema, edema and mild warmth noted to right shin area  Psychiatric: She has a normal mood and affect.          Assessment & Plan:  1. Cellulitis of leg, right - cephALEXin (KEFLEX) 500 MG capsule; Take 1 capsule (500 mg total) by mouth 2 (two) times daily.  Dispense: 14 capsule; Refill: 0 -elevate extremity  -keep area clean and dry -RTO if symptoms worsen and in one week for follow up -take medications even if feeling better -Patient verbalized understanding -Coralie Keens, FNP-C

## 2012-12-14 ENCOUNTER — Other Ambulatory Visit: Payer: Self-pay | Admitting: *Deleted

## 2012-12-14 MED ORDER — LOSARTAN POTASSIUM 100 MG PO TABS: 100.0000 mg | ORAL_TABLET | Freq: Every day | ORAL | Status: DC

## 2012-12-18 ENCOUNTER — Telehealth: Payer: Self-pay | Admitting: General Practice

## 2012-12-18 ENCOUNTER — Other Ambulatory Visit: Payer: Self-pay | Admitting: General Practice

## 2012-12-18 DIAGNOSIS — L0390 Cellulitis, unspecified: Secondary | ICD-10-CM

## 2012-12-18 MED ORDER — DOXYCYCLINE HYCLATE 100 MG PO TABS: 100.0000 mg | ORAL_TABLET | Freq: Two times a day (BID) | ORAL | Status: DC

## 2012-12-18 NOTE — Telephone Encounter (Signed)
Please inform patient that script called in and she will need to be seen if unresolved. thx

## 2012-12-18 NOTE — Telephone Encounter (Signed)
Mae to address 

## 2012-12-18 NOTE — Telephone Encounter (Signed)
Left details on patient's vm.

## 2013-01-05 ENCOUNTER — Other Ambulatory Visit: Payer: Self-pay | Admitting: *Deleted

## 2013-01-05 MED ORDER — LEVOTHYROXINE SODIUM 88 MCG PO TABS: 88.0000 ug | ORAL_TABLET | Freq: Every day | ORAL | Status: DC

## 2013-01-29 ENCOUNTER — Other Ambulatory Visit: Payer: Self-pay | Admitting: General Practice

## 2013-01-29 LAB — HM MAMMOGRAPHY

## 2013-02-03 ENCOUNTER — Other Ambulatory Visit: Payer: Self-pay

## 2013-02-12 ENCOUNTER — Other Ambulatory Visit: Payer: Self-pay | Admitting: Family Medicine

## 2013-02-18 ENCOUNTER — Encounter: Payer: Self-pay | Admitting: General Practice

## 2013-02-18 ENCOUNTER — Ambulatory Visit (INDEPENDENT_AMBULATORY_CARE_PROVIDER_SITE_OTHER): Payer: Medicare Other | Admitting: General Practice

## 2013-02-18 VITALS — BP 141/72 | HR 61 | Temp 97.7°F | Ht 61.0 in | Wt 209.0 lb

## 2013-02-18 DIAGNOSIS — E559 Vitamin D deficiency, unspecified: Secondary | ICD-10-CM

## 2013-02-18 DIAGNOSIS — I10 Essential (primary) hypertension: Secondary | ICD-10-CM

## 2013-02-18 DIAGNOSIS — E039 Hypothyroidism, unspecified: Secondary | ICD-10-CM | POA: Diagnosis not present

## 2013-02-18 DIAGNOSIS — E119 Type 2 diabetes mellitus without complications: Secondary | ICD-10-CM

## 2013-02-18 DIAGNOSIS — E785 Hyperlipidemia, unspecified: Secondary | ICD-10-CM

## 2013-02-18 DIAGNOSIS — Z09 Encounter for follow-up examination after completed treatment for conditions other than malignant neoplasm: Secondary | ICD-10-CM

## 2013-02-18 LAB — POCT CBC
HCT, POC: 40.8 % (ref 37.7–47.9)
Hemoglobin: 13.5 g/dL (ref 12.2–16.2)
MCH, POC: 30.9 pg (ref 27–31.2)
MCHC: 33.2 g/dL (ref 31.8–35.4)
MPV: 7.6 fL (ref 0–99.8)
POC Granulocyte: 3.3 (ref 2–6.9)
RBC: 4.4 M/uL (ref 4.04–5.48)

## 2013-02-18 LAB — POCT GLYCOSYLATED HEMOGLOBIN (HGB A1C): Hemoglobin A1C: 6.8

## 2013-02-18 MED ORDER — LEVOTHYROXINE SODIUM 88 MCG PO TABS: 88.0000 ug | ORAL_TABLET | Freq: Every day | ORAL | Status: DC

## 2013-02-18 MED ORDER — SIMVASTATIN 40 MG PO TABS: 40.0000 mg | ORAL_TABLET | Freq: Every day | ORAL | Status: DC

## 2013-02-18 MED ORDER — METFORMIN HCL 500 MG PO TABS: 500.0000 mg | ORAL_TABLET | Freq: Two times a day (BID) | ORAL | Status: DC

## 2013-02-18 NOTE — Progress Notes (Signed)
  Subjective:    Patient ID: Alexandria Irwin, female    DOB: Aug 31, 1945, 67 y.o.   MRN: [id number]  HPI Patient presents today for 3 month follow up of chronic conditions. She has a history of hypertension, hypothyroidism, diabetes, and hyperlipidemia. She reports taking medications as directed. Reports checking blood sugars 3-4 times weekly, ranging 130's-170's. Patient reports eating foods that aren't appropriate for diabetics, but this is something she is working hard to improve. Reports having a treadmill and is planning on starting a exercise routine.     Review of Systems  Constitutional: Negative for fever and chills.  HENT: Negative for ear pain, neck pain and neck stiffness.   Eyes: Negative for pain.  Respiratory: Negative for chest tightness and shortness of breath.   Cardiovascular: Negative for chest pain and palpitations.  Gastrointestinal: Negative for vomiting, abdominal pain, diarrhea and blood in stool.  Genitourinary: Negative for dysuria, hematuria and difficulty urinating.  Musculoskeletal: Negative for back pain.  Neurological: Negative for dizziness, weakness and headaches.       Objective:   Physical Exam  Constitutional: She is oriented to person, place, and time. She appears well-developed and well-nourished.  HENT:  Head: Normocephalic and atraumatic.  Right Ear: External ear normal.  Left Ear: External ear normal.  Nose: Nose normal.  Mouth/Throat: Oropharynx is clear and moist.  Eyes: EOM are normal. Pupils are equal, round, and reactive to light.  Neck: Normal range of motion. Neck supple. No thyromegaly present.  Cardiovascular: Normal rate, regular rhythm and normal heart sounds.   Pulmonary/Chest: Effort normal and breath sounds normal. No respiratory distress. She exhibits no tenderness.  Abdominal: Soft. Bowel sounds are normal. She exhibits no distension. There is no tenderness.  Musculoskeletal: She exhibits no edema and no tenderness.   Lymphadenopathy:    She has no cervical adenopathy.  Neurological: She is alert and oriented to person, place, and time.  Skin: Skin is warm and dry.  Psychiatric: She has a normal mood and affect.          Assessment & Plan:  1. Diabetes - POCT glycosylated hemoglobin (Hb A1C)  2. Hypertension - CMP14+EGFR  3. Other and unspecified hyperlipidemia - NMR, lipoprofile  4. Unspecified hypothyroidism - Thyroid Panel With TSH  5. Follow-up exam, 3-6 months since previous exam - POCT CBC  6. Unspecified vitamin D deficiency - Vit D  25 hydroxy (rtn osteoporosis monitoring) -Continue all current medications Labs pending F/u in 3 months and prn Discussed exercise and diet  Patient verbalized understanding Coralie Keens, FNP-C

## 2013-02-18 NOTE — Patient Instructions (Signed)

## 2013-02-19 LAB — CMP14+EGFR
ALT: 37 IU/L — ABNORMAL HIGH (ref 0–32)
Albumin: 4.3 g/dL (ref 3.6–4.8)
BUN: 13 mg/dL (ref 8–27)
CO2: 29 mmol/L (ref 18–29)
Calcium: 9.4 mg/dL (ref 8.6–10.2)
Chloride: 98 mmol/L (ref 97–108)
GFR calc Af Amer: 78 mL/min/{1.73_m2} (ref >59)
Glucose: 148 mg/dL — ABNORMAL HIGH (ref 65–99)
Potassium: 5 mmol/L (ref 3.5–5.2)
Total Bilirubin: 0.5 mg/dL (ref 0.0–1.2)
Total Protein: 6.7 g/dL (ref 6.0–8.5)

## 2013-02-19 LAB — VITAMIN D 25 HYDROXY (VIT D DEFICIENCY, FRACTURES): Vit D, 25-Hydroxy: 28.4 ng/mL — ABNORMAL LOW (ref 30.0–100.0)

## 2013-02-19 LAB — NMR, LIPOPROFILE
HDL Cholesterol by NMR: 41 mg/dL (ref >=40)
LDLC SERPL CALC-MCNC: 38 mg/dL (ref <100)
Small LDL Particle Number: 892 nmol/L — ABNORMAL HIGH (ref <=527)
Triglycerides by NMR: 182 mg/dL — ABNORMAL HIGH (ref <150)

## 2013-02-19 LAB — THYROID PANEL WITH TSH
Free Thyroxine Index: 2.1 (ref 1.2–4.9)
T4, Total: 7.2 ug/dL (ref 4.5–12.0)
TSH: 3.05 u[IU]/mL (ref 0.450–4.500)

## 2013-03-03 DIAGNOSIS — Z1231 Encounter for screening mammogram for malignant neoplasm of breast: Secondary | ICD-10-CM | POA: Diagnosis not present

## 2013-03-03 DIAGNOSIS — Z124 Encounter for screening for malignant neoplasm of cervix: Secondary | ICD-10-CM | POA: Diagnosis not present

## 2013-04-01 ENCOUNTER — Other Ambulatory Visit: Payer: Self-pay | Admitting: *Deleted

## 2013-04-01 DIAGNOSIS — E785 Hyperlipidemia, unspecified: Secondary | ICD-10-CM

## 2013-04-01 MED ORDER — SIMVASTATIN 40 MG PO TABS: 40.0000 mg | ORAL_TABLET | Freq: Every day | ORAL | Status: DC

## 2013-04-10 ENCOUNTER — Ambulatory Visit (INDEPENDENT_AMBULATORY_CARE_PROVIDER_SITE_OTHER): Payer: Medicare Other | Admitting: General Practice

## 2013-04-10 ENCOUNTER — Encounter: Payer: Self-pay | Admitting: General Practice

## 2013-04-10 VITALS — BP 109/73 | HR 56 | Temp 98.7°F | Ht 61.0 in | Wt 208.0 lb

## 2013-04-10 DIAGNOSIS — T148XXA Other injury of unspecified body region, initial encounter: Secondary | ICD-10-CM | POA: Diagnosis not present

## 2013-04-10 NOTE — Patient Instructions (Signed)
Muscle Strain  Muscle strain occurs when a muscle is stretched beyond its normal length. A small number of muscle fibers generally are torn. This is especially common in athletes. This happens when a sudden, violent force placed on a muscle stretches it too far. Usually, recovery from muscle strain takes 1 to 2 weeks. Complete healing will take 5 to 6 weeks.   HOME CARE INSTRUCTIONS    While awake, apply ice to the sore muscle for the first 2 days after the injury.   Put ice in a plastic bag.   Place a towel between your skin and the bag.   Leave the ice on for 15-20 minutes each hour.   Do not use the strained muscle for several days, until you no longer have pain.   You may wrap the injured area with an elastic bandage for comfort. Be careful not to wrap it too tightly. This may interfere with blood circulation or increase swelling.   Only take over-the-counter or prescription medicines for pain, discomfort, or fever as directed by your caregiver.  SEEK MEDICAL CARE IF:   You have increasing pain or swelling in the injured area.  MAKE SURE YOU:    Understand these instructions.   Will watch your condition.   Will get help right away if you are not doing well or get worse.  Document Released: 06/17/2005 Document Revised: 09/09/2011 Document Reviewed: 06/29/2011  [employer] Patient Information 2014 [employer].

## 2013-04-10 NOTE — Progress Notes (Signed)
  Subjective:    Patient ID: Alexandria Irwin, female    DOB: [birth year], 67 y.o.   MRN: [id number]  HPI Patient presents today with complaints of left upper leg pain. She reports feeling a pulling in her inner thigh muscle. She denies known injury or straining of muscle. Reports tylenol relieved her pain last night.     Review of Systems  Constitutional: Negative for fever and chills.  Respiratory: Negative for chest tightness and shortness of breath.   Cardiovascular: Negative for chest pain, palpitations and leg swelling.  Musculoskeletal: Positive for myalgias. Negative for back pain and joint swelling.       Objective:   Physical Exam  Constitutional: She is oriented to person, place, and time. She appears well-developed and well-nourished.  Cardiovascular: Regular rhythm and normal heart sounds.   Pulmonary/Chest: Effort normal and breath sounds normal.  Musculoskeletal: She exhibits tenderness. She exhibits no edema.  Tenderness noted to adductus magnus (left inner thigh) muscle, with palpation. Negative for swelling  Neurological: She is alert and oriented to person, place, and time.  Skin: Skin is warm and dry.  Psychiatric: She has a normal mood and affect.          Assessment & Plan:  1. Muscle strain -information sheet provided and discussed on muscle strain -ice as directed -rest -may continue tylenol as directed OTC or use motrin  -RTO if symptoms worsen or unresolved Patient verbalized understanding Coralie Keens, FNP-C

## 2013-05-04 ENCOUNTER — Ambulatory Visit (INDEPENDENT_AMBULATORY_CARE_PROVIDER_SITE_OTHER): Payer: Medicare Other

## 2013-05-04 DIAGNOSIS — Z23 Encounter for immunization: Secondary | ICD-10-CM

## 2013-06-09 ENCOUNTER — Other Ambulatory Visit: Payer: Self-pay | Admitting: Physician Assistant

## 2013-06-16 ENCOUNTER — Other Ambulatory Visit: Payer: Self-pay

## 2013-06-16 DIAGNOSIS — E119 Type 2 diabetes mellitus without complications: Secondary | ICD-10-CM

## 2013-06-16 MED ORDER — METFORMIN HCL 500 MG PO TABS: 500.0000 mg | ORAL_TABLET | Freq: Two times a day (BID) | ORAL | Status: DC

## 2013-06-16 MED ORDER — CLONIDINE HCL 0.1 MG PO TABS: 0.1000 mg | ORAL_TABLET | Freq: Two times a day (BID) | ORAL | Status: DC

## 2013-06-19 ENCOUNTER — Ambulatory Visit (INDEPENDENT_AMBULATORY_CARE_PROVIDER_SITE_OTHER): Payer: Medicare Other | Admitting: General Practice

## 2013-06-19 VITALS — BP 147/79 | HR 54 | Temp 97.1°F | Ht 61.0 in | Wt 202.0 lb

## 2013-06-19 DIAGNOSIS — J069 Acute upper respiratory infection, unspecified: Secondary | ICD-10-CM | POA: Diagnosis not present

## 2013-06-19 MED ORDER — ALBUTEROL SULFATE HFA 108 (90 BASE) MCG/ACT IN AERS: 2.0000 | INHALATION_SPRAY | Freq: Four times a day (QID) | RESPIRATORY_TRACT | Status: DC | PRN

## 2013-06-19 MED ORDER — AMOXICILLIN-POT CLAVULANATE 875-125 MG PO TABS: 1.0000 | ORAL_TABLET | Freq: Two times a day (BID) | ORAL | Status: DC

## 2013-06-19 NOTE — Patient Instructions (Signed)

## 2013-06-19 NOTE — Progress Notes (Signed)
   Subjective:    Patient ID: Alexandria Irwin, female    DOB: [birth year], 67 y.o.   MRN: [id number]  Cough This is a new problem. The current episode started in the past 7 days. The problem has been unchanged. The problem occurs every few minutes. The cough is productive of sputum. Associated symptoms include nasal congestion, postnasal drip and wheezing. Pertinent negatives include no chest pain, chills, fever, sore throat or shortness of breath. The symptoms are aggravated by lying down. She has tried nothing for the symptoms. There is no history of asthma, bronchitis, emphysema or pneumonia.      Review of Systems  Constitutional: Negative for fever and chills.  HENT: Positive for postnasal drip. Negative for sore throat.   Respiratory: Positive for cough and wheezing. Negative for chest tightness and shortness of breath.   Cardiovascular: Negative for chest pain and palpitations.  All other systems reviewed and are negative.       Objective:   Physical Exam  Constitutional: She is oriented to person, place, and time. She appears well-developed and well-nourished.  Cardiovascular: Normal rate, regular rhythm and normal heart sounds.   Pulmonary/Chest: Effort normal. She has wheezes in the right upper field and the left upper field. She exhibits no tenderness.  Neurological: She is alert and oriented to person, place, and time.  Skin: Skin is warm and dry.  Psychiatric: She has a normal mood and affect.          Assessment & Plan:  1. Upper respiratory infection - amoxicillin-clavulanate (AUGMENTIN) 875-125 MG per tablet; Take 1 tablet by mouth 2 (two) times daily.  Dispense: 20 tablet; Refill: 0 -continue to use albuterol inhaler as directed -increase fluids -avoid irritants -RTO if symptoms worsen or unresolved -Patient verbalized understanding Coralie Keens, FNP-C

## 2013-06-21 ENCOUNTER — Other Ambulatory Visit: Payer: Self-pay | Admitting: *Deleted

## 2013-06-21 MED ORDER — METOPROLOL TARTRATE 50 MG PO TABS: ORAL_TABLET | ORAL | Status: DC

## 2013-06-24 ENCOUNTER — Other Ambulatory Visit: Payer: Self-pay | Admitting: Nurse Practitioner

## 2013-07-01 ENCOUNTER — Other Ambulatory Visit: Payer: Self-pay | Admitting: Nurse Practitioner

## 2013-07-05 ENCOUNTER — Other Ambulatory Visit: Payer: Self-pay

## 2013-07-05 MED ORDER — FUROSEMIDE 40 MG PO TABS: 40.0000 mg | ORAL_TABLET | Freq: Every day | ORAL | Status: DC

## 2013-07-28 ENCOUNTER — Other Ambulatory Visit: Payer: Self-pay | Admitting: *Deleted

## 2013-07-28 DIAGNOSIS — E785 Hyperlipidemia, unspecified: Secondary | ICD-10-CM

## 2013-07-28 MED ORDER — SIMVASTATIN 40 MG PO TABS: 40.0000 mg | ORAL_TABLET | Freq: Every day | ORAL | Status: DC

## 2013-08-19 ENCOUNTER — Other Ambulatory Visit: Payer: Self-pay

## 2013-08-19 MED ORDER — CLONIDINE HCL 0.1 MG PO TABS: 0.1000 mg | ORAL_TABLET | Freq: Two times a day (BID) | ORAL | Status: DC

## 2013-09-17 ENCOUNTER — Other Ambulatory Visit: Payer: Self-pay | Admitting: *Deleted

## 2013-09-17 MED ORDER — METOPROLOL TARTRATE 50 MG PO TABS: ORAL_TABLET | ORAL | Status: DC

## 2013-09-18 ENCOUNTER — Emergency Department (HOSPITAL_COMMUNITY): Payer: Medicare Other

## 2013-09-18 ENCOUNTER — Ambulatory Visit (INDEPENDENT_AMBULATORY_CARE_PROVIDER_SITE_OTHER): Payer: Medicare Other | Admitting: Family Medicine

## 2013-09-18 ENCOUNTER — Encounter (HOSPITAL_COMMUNITY): Payer: Self-pay | Admitting: Emergency Medicine

## 2013-09-18 ENCOUNTER — Emergency Department (HOSPITAL_COMMUNITY)
Admission: EM | Admit: 2013-09-18 | Discharge: 2013-09-18 | Disposition: A | Discharge status: Home / Self Care | Payer: Medicare Other | Attending: Emergency Medicine | Admitting: Emergency Medicine

## 2013-09-18 VITALS — BP 131/71 | HR 72 | Temp 97.1°F | Ht 61.0 in | Wt 197.2 lb

## 2013-09-18 DIAGNOSIS — L02419 Cutaneous abscess of limb, unspecified: Secondary | ICD-10-CM | POA: Diagnosis not present

## 2013-09-18 DIAGNOSIS — R609 Edema, unspecified: Secondary | ICD-10-CM | POA: Diagnosis not present

## 2013-09-18 DIAGNOSIS — E119 Type 2 diabetes mellitus without complications: Secondary | ICD-10-CM | POA: Insufficient documentation

## 2013-09-18 DIAGNOSIS — I83893 Varicose veins of bilateral lower extremities with other complications: Secondary | ICD-10-CM | POA: Insufficient documentation

## 2013-09-18 DIAGNOSIS — E079 Disorder of thyroid, unspecified: Secondary | ICD-10-CM | POA: Insufficient documentation

## 2013-09-18 DIAGNOSIS — I878 Other specified disorders of veins: Secondary | ICD-10-CM

## 2013-09-18 DIAGNOSIS — I10 Essential (primary) hypertension: Secondary | ICD-10-CM | POA: Diagnosis not present

## 2013-09-18 DIAGNOSIS — R300 Dysuria: Secondary | ICD-10-CM | POA: Insufficient documentation

## 2013-09-18 DIAGNOSIS — I803 Phlebitis and thrombophlebitis of lower extremities, unspecified: Secondary | ICD-10-CM | POA: Diagnosis not present

## 2013-09-18 DIAGNOSIS — I872 Venous insufficiency (chronic) (peripheral): Secondary | ICD-10-CM | POA: Diagnosis not present

## 2013-09-18 DIAGNOSIS — Z7982 Long term (current) use of aspirin: Secondary | ICD-10-CM | POA: Insufficient documentation

## 2013-09-18 DIAGNOSIS — L03119 Cellulitis of unspecified part of limb: Secondary | ICD-10-CM

## 2013-09-18 DIAGNOSIS — M79609 Pain in unspecified limb: Secondary | ICD-10-CM | POA: Diagnosis not present

## 2013-09-18 DIAGNOSIS — Z9889 Other specified postprocedural states: Secondary | ICD-10-CM | POA: Diagnosis not present

## 2013-09-18 DIAGNOSIS — Z792 Long term (current) use of antibiotics: Secondary | ICD-10-CM | POA: Diagnosis not present

## 2013-09-18 DIAGNOSIS — Z79899 Other long term (current) drug therapy: Secondary | ICD-10-CM | POA: Diagnosis not present

## 2013-09-18 DIAGNOSIS — M7989 Other specified soft tissue disorders: Secondary | ICD-10-CM | POA: Diagnosis not present

## 2013-09-18 DIAGNOSIS — E785 Hyperlipidemia, unspecified: Secondary | ICD-10-CM | POA: Diagnosis not present

## 2013-09-18 LAB — URINALYSIS, ROUTINE W REFLEX MICROSCOPIC
Bilirubin Urine: NEGATIVE
Glucose, UA: NEGATIVE mg/dL
Hgb urine dipstick: NEGATIVE
KETONES UR: NEGATIVE mg/dL
LEUKOCYTES UA: NEGATIVE
NITRITE: NEGATIVE
Protein, ur: NEGATIVE mg/dL
SPECIFIC GRAVITY, URINE: 1.015 (ref 1.005–1.030)
Urobilinogen, UA: 0.2 mg/dL (ref 0.0–1.0)
pH: 6.5 (ref 5.0–8.0)

## 2013-09-18 LAB — CBC WITH DIFFERENTIAL/PLATELET
Basophils Absolute: 0 10*3/uL (ref 0.0–0.1)
Basophils Relative: 1 % (ref 0–1)
EOS PCT: 3 % (ref 0–5)
Eosinophils Absolute: 0.2 10*3/uL (ref 0.0–0.7)
HEMATOCRIT: 38.9 % (ref 36.0–46.0)
Hemoglobin: 13.4 g/dL (ref 12.0–15.0)
LYMPHS ABS: 2.4 10*3/uL (ref 0.7–4.0)
Lymphocytes Relative: 39 % (ref 12–46)
MCH: 32.1 pg (ref 26.0–34.0)
MCHC: 34.4 g/dL (ref 30.0–36.0)
MCV: 93.3 fL (ref 78.0–100.0)
Monocytes Absolute: 0.4 10*3/uL (ref 0.1–1.0)
Monocytes Relative: 6 % (ref 3–12)
NEUTROS ABS: 3.2 10*3/uL (ref 1.7–7.7)
Neutrophils Relative %: 52 % (ref 43–77)
Platelets: 208 10*3/uL (ref 150–400)
RBC: 4.17 MIL/uL (ref 3.87–5.11)
RDW: 13 % (ref 11.5–15.5)
WBC: 6.1 10*3/uL (ref 4.0–10.5)

## 2013-09-18 LAB — BASIC METABOLIC PANEL
BUN: 14 mg/dL (ref 6–23)
CALCIUM: 9.7 mg/dL (ref 8.4–10.5)
CHLORIDE: 98 meq/L (ref 96–112)
CO2: 33 meq/L — AB (ref 19–32)
Creatinine, Ser: 0.92 mg/dL (ref 0.50–1.10)
GFR calc Af Amer: 71 mL/min — ABNORMAL LOW (ref >90)
GFR calc non Af Amer: 62 mL/min — ABNORMAL LOW (ref >90)
GLUCOSE: 189 mg/dL — AB (ref 70–99)
Potassium: 4.3 mEq/L (ref 3.7–5.3)
Sodium: 142 mEq/L (ref 137–147)

## 2013-09-18 MED ORDER — DOXYCYCLINE HYCLATE 100 MG PO CAPS: 100.0000 mg | ORAL_CAPSULE | Freq: Two times a day (BID) | ORAL | Status: DC

## 2013-09-18 NOTE — Progress Notes (Signed)
Patient ID: Nioma Mccubbins, female   DOB: 1945/09/09, 68 y.o.   MRN: [id number] SUBJECTIVE: CC: Chief Complaint  Patient presents with  . Leg Swelling  . Urinary Tract Infection    HPI: 1) having dysuria. Thinks she has a UTI.  2) right leg swelled up a gain over the last couple of days and has gotten red and sore. Has had vein stripping and has had small superficial clots in the past. Thinks she needs antibiotics.  Past Medical History  Diagnosis Date  . Diabetes mellitus without complication   . Hypertension   . Thyroid disease   . Hyperlipidemia    Past Surgical History  Procedure Laterality Date  . Cholecystectomy    . Vein surgery     History   Social History  . Marital Status: Married    Spouse Name: N/A    Number of Children: N/A  . Years of Education: N/A   Occupational History  . Not on file.   Social History Main Topics  . Smoking status: Never Smoker   . Smokeless tobacco: Not on file  . Alcohol Use: No  . Drug Use: No  . Sexual Activity: Not on file   Other Topics Concern  . Not on file   Social History Narrative  . No narrative on file   Family History  Problem Relation Age of Onset  . Cancer Mother     ovarian   Current Outpatient Prescriptions on File Prior to Visit  Medication Sig Dispense Refill  . albuterol (PROVENTIL HFA;VENTOLIN HFA) 108 (90 BASE) MCG/ACT inhaler Inhale 2 puffs into the lungs every 6 (six) hours as needed for wheezing or shortness of breath.  1 Inhaler  1  . aspirin 325 MG tablet Take 325 mg by mouth daily.      . B Complex Vitamins (VITAMIN B-COMPLEX PO) Take 1 tablet by mouth daily.      . Calcium Citrate-Vitamin D (CVS CALCIUM CITRATE +D PO) Take 1 tablet by mouth daily.      . cholecalciferol (VITAMIN D) 1000 UNITS tablet Take 2,000 Units by mouth 2 (two) times daily.       . cloNIDine (CATAPRES) 0.1 MG tablet Take 1 tablet (0.1 mg total) by mouth 2 (two) times daily.  60 tablet  2  . cycloSPORINE (RESTASIS) 0.05 %  ophthalmic emulsion 1 drop 2 (two) times daily.      . furosemide (LASIX) 40 MG tablet Take 1 tablet (40 mg total) by mouth daily.  30 tablet  5  . levothyroxine (SYNTHROID, LEVOTHROID) 88 MCG tablet Take 1 tablet (88 mcg total) by mouth daily.  30 tablet  3  . levothyroxine (SYNTHROID, LEVOTHROID) 88 MCG tablet TAKE 1 TABLET (88 MCG TOTAL) BY MOUTH DAILY.  30 tablet  6  . losartan (COZAAR) 100 MG tablet TAKE 1 TABLET (100 MG TOTAL) BY MOUTH DAILY.  30 tablet  3  . Magnesium 250 MG TABS Take 1 tablet by mouth daily.      . metFORMIN (GLUCOPHAGE) 500 MG tablet Take 1 tablet (500 mg total) by mouth 2 (two) times daily with a meal.  60 tablet  3  . metoprolol (LOPRESSOR) 50 MG tablet TAKE 1 TABLET (50 MG TOTAL) BY MOUTH 2 (TWO) TIMES DAILY.  60 tablet  2  . simvastatin (ZOCOR) 40 MG tablet Take 1 tablet (40 mg total) by mouth at bedtime.  30 tablet  1  . vitamin C (ASCORBIC ACID) 500 MG tablet Take 500 mg  by mouth daily.       No current facility-administered medications on file prior to visit.   Allergies  Allergen Reactions  . Ace Inhibitors   . Neosporin [Neomycin-Bacitracin Zn-Polymyx]   . Norvasc [Amlodipine]   . Sulfa Antibiotics    Immunization History  Administered Date(s) Administered  . Influenza Whole 08/02/2011  . Influenza,inj,Quad PF,36+ Mos 05/04/2013   Prior to Admission medications   Medication Sig Start Date End Date Taking? Authorizing Provider  albuterol (PROVENTIL HFA;VENTOLIN HFA) 108 (90 BASE) MCG/ACT inhaler Inhale 2 puffs into the lungs every 6 (six) hours as needed for wheezing or shortness of breath. 06/19/13  Yes Mae Loree Fee, FNP  aspirin 325 MG tablet Take 325 mg by mouth daily.   Yes Historical Provider, MD  B Complex Vitamins (VITAMIN B-COMPLEX PO) Take 1 tablet by mouth daily.   Yes Historical Provider, MD  Calcium Citrate-Vitamin D (CVS CALCIUM CITRATE +D PO) Take 1 tablet by mouth daily.   Yes Historical Provider, MD  cholecalciferol (VITAMIN D) 1000  UNITS tablet Take 2,000 Units by mouth 2 (two) times daily.    Yes Historical Provider, MD  cloNIDine (CATAPRES) 0.1 MG tablet Take 1 tablet (0.1 mg total) by mouth 2 (two) times daily. 08/19/13  Yes Mae Loree Fee, FNP  cycloSPORINE (RESTASIS) 0.05 % ophthalmic emulsion 1 drop 2 (two) times daily.   Yes Historical Provider, MD  furosemide (LASIX) 40 MG tablet Take 1 tablet (40 mg total) by mouth daily. 07/05/13  Yes Mae Loree Fee, FNP  levothyroxine (SYNTHROID, LEVOTHROID) 88 MCG tablet Take 1 tablet (88 mcg total) by mouth daily. 02/18/13  Yes Mae Loree Fee, FNP  levothyroxine (SYNTHROID, LEVOTHROID) 88 MCG tablet TAKE 1 TABLET (88 MCG TOTAL) BY MOUTH DAILY. 07/01/13  Yes Mae Loree Fee, FNP  losartan (COZAAR) 100 MG tablet TAKE 1 TABLET (100 MG TOTAL) BY MOUTH DAILY. 06/09/13  Yes Mae Loree Fee, FNP  Magnesium 250 MG TABS Take 1 tablet by mouth daily.   Yes Historical Provider, MD  metFORMIN (GLUCOPHAGE) 500 MG tablet Take 1 tablet (500 mg total) by mouth 2 (two) times daily with a meal. 06/16/13  Yes Mae Loree Fee, FNP  metoprolol (LOPRESSOR) 50 MG tablet TAKE 1 TABLET (50 MG TOTAL) BY MOUTH 2 (TWO) TIMES DAILY. 09/17/13  Yes Mae Loree Fee, FNP  simvastatin (ZOCOR) 40 MG tablet Take 1 tablet (40 mg total) by mouth at bedtime. 07/28/13  Yes Mae Loree Fee, FNP  vitamin C (ASCORBIC ACID) 500 MG tablet Take 500 mg by mouth daily.   Yes Historical Provider, MD     ROS: As above in the HPI. All other systems are stable or negative.  OBJECTIVE: APPEARANCE:  Patient in no acute distress.The patient appeared well nourished and normally developed. Acyanotic. Waist: VITAL SIGNS:BP 131/71  Pulse 72  Temp(Src) 97.1 F (36.2 C) (Oral)  Ht 5\' 1"  (1.549 m)  Wt 197 lb 3.2 oz (89.449 kg)  BMI 37.28 kg/m2 Obese short statured WF  SKIN: warm and  Dry without overt rashes, tattoos and scars  HEAD and Neck: without JVD, Head and scalp: normal Eyes:No scleral icterus. Fundi normal, eye  movements normal. Ears: Auricle normal, canal normal, Tympanic membranes normal, insufflation normal. Nose: normal Throat: normal Neck & thyroid: normal  CHEST & LUNGS: Chest wall: normal Lungs: Clear  CVS: Reveals the PMI to be normally located. Regular rhythm, First and Second Heart sounds are normal,  absence of murmurs, rubs or gallops. Peripheral vasculature: Radial  pulses: normal Dorsal pedis pulses: normal Posterior pulses: normal  ABDOMEN:  Appearance: normal Benign, no organomegaly, no masses, no Abdominal Aortic enlargement. No Guarding , no rebound. No Bruits. Bowel sounds: normal  RECTAL: N/A GU: N/A  EXTREMETIES: 2+ edematous right leg with redness and venous stasis dermatitis. Several areas of superficial thrombophlebitis in the medial right calf going toward where the deep perforator should be. homa's positive.  NEUROLOGIC: oriented to time,place and person; nonfocal.  ASSESSMENT: Thrombophlebitis leg - need to r/o DVT  Dysuria  PLAN: Referred to [facility] ED. Charge nurse informed.   No orders of the defined types were placed in this encounter.   No orders of the defined types were placed in this encounter.   Medications Discontinued During This Encounter  Medication Reason  . amoxicillin-clavulanate (AUGMENTIN) 875-125 MG per tablet Completed Course   Return for referred to ED.  Kenslei Hearty P. Jacelyn Grip, M.D.

## 2013-09-18 NOTE — Discharge Instructions (Signed)
Cellulitis Cellulitis is an infection of the skin and the tissue under the skin. The infected area is usually red and tender. This happens most often in the arms and lower legs. HOME CARE   Take your antibiotic medicine as told. Finish the medicine even if you start to feel better.  Keep the infected arm or leg raised (elevated).  Put a warm cloth on the area up to 4 times per day.  Only take medicines as told by your doctor.  Keep all doctor visits as told. GET HELP RIGHT AWAY IF:   You have a fever.  You feel very sleepy.  You throw up (vomit) or have watery poop (diarrhea).  You feel sick and have muscle aches and pains.  You see red streaks on the skin coming from the infected area.  Your red area gets bigger or turns a dark color.  Your bone or joint under the infected area is painful after the skin heals.  Your infection comes back in the same area or different area.  You have a puffy (swollen) bump in the infected area.  You have new symptoms. MAKE SURE YOU:   Understand these instructions.  Will watch your condition.  Will get help right away if you are not doing well or get worse. Document Released: 12/04/2007 Document Revised: 12/17/2011 Document Reviewed: 09/02/2011 [facility] Patient Information 2014 [place], [place].  Edema Edema is a buildup of fluids. It is most common in the feet, ankles, and legs. This happens more as a person ages. It may affect one or both legs. HOME CARE   Raise (elevate) the legs or ankles above the level of the heart while lying down.  Avoid sitting or standing still for a long time.  Exercise the legs to help the puffiness (swelling) go down.  A low-salt diet may help lessen the puffiness.  Only take medicine as told by your doctor. GET HELP RIGHT AWAY IF:   You develop shortness of breath or chest pain.  You cannot breathe when you lie down.  You have more puffiness that does not go away with treatment.  You  develop pain or redness in the areas that are puffy.  You have a temperature by mouth above 102 F (38.9 C), not controlled by medicine.  You gain 03 lb/1.4 kg or more in 1 day or 05 lb/2.3 kg in a week. MAKE SURE YOU:   Understand these instructions.  Will watch your condition.  Will get help right away if you are not doing well or get worse. Document Released: 12/04/2007 Document Revised: 09/09/2011 Document Reviewed: 12/04/2007 [facility] Patient Information 2014 [place], [place].  Your ultrasound today is negative for a deep vein thrombosis (DVT).

## 2013-09-18 NOTE — ED Notes (Addendum)
Pt presents to er this am from [place] for further evaluation of swelling behind right knee area that started several weeks ago,  Distal pulse present, has hx of surgery on right leg. Pt is also concerned that she may have an uti, has been having burning with urination for "awhile"

## 2013-09-18 NOTE — ED Notes (Signed)
Pt presents with swelling, redness and pain to right lower leg. Pt reports "knots" in area and states pain is constant. Pain becomes worse with walking. Pt has history of DVT. Pt was sent by PCP to rule out clot. Pt also reports dysuria and states "I think I have a UTI".

## 2013-09-20 ENCOUNTER — Telehealth: Payer: Self-pay | Admitting: Family Medicine

## 2013-09-20 NOTE — Telephone Encounter (Signed)
Appt given for Friday per patients request 

## 2013-09-22 NOTE — ED Provider Notes (Signed)
CSN: [id number]     Arrival date & time 09/18/13  2951 History   First MD Initiated Contact with Patient 09/18/13 [phone]     Chief Complaint  Patient presents with  . Leg Pain     (Consider location/radiation/quality/duration/timing/severity/associated sxs/prior Treatment) HPI Comments: Alexandria Irwin is a 68 y.o. Female with a history of varicose veins with a prior vein stripping procedure and a history of prior superficial thrombophlebitis presents with pain and increased swelling with erythema of her right lower leg.  She was seen by her pcp this am and sent here to evaluate for possible dvt.  Her symptoms have been slowly progressive over the past several days.  She states these symptoms occur occasionally and has had studies in the past and has never had a dvt.   She denies fevers or chills, cough, shortness of breath or chest pain.  She does describe increased urinary frequency and is concerned about possible uti.  She has had no hematuria and denies flank pain, nausea or vomiting.  She has had no medicines prior to arrival for her symptoms.     The history is provided by the patient.    Past Medical History  Diagnosis Date  . Diabetes mellitus without complication   . Hypertension   . Thyroid disease   . Hyperlipidemia    Past Surgical History  Procedure Laterality Date  . Cholecystectomy    . Vein surgery     Family History  Problem Relation Age of Onset  . Cancer Mother     ovarian   History  Substance Use Topics  . Smoking status: Never Smoker   . Smokeless tobacco: Not on file  . Alcohol Use: No   OB History   Grav Para Term Preterm Abortions TAB SAB Ect Mult Living                 Review of Systems  Constitutional: Negative for fever.  HENT: Negative for congestion and sore throat.   Eyes: Negative.   Respiratory: Negative for chest tightness, shortness of breath and wheezing.   Cardiovascular: Negative for chest pain.  Gastrointestinal: Negative for nausea  and abdominal pain.  Genitourinary: Positive for dysuria.  Musculoskeletal: Negative for arthralgias, joint swelling and neck pain.  Skin: Positive for color change. Negative for rash and wound.  Neurological: Negative for dizziness, weakness, light-headedness, numbness and headaches.  Psychiatric/Behavioral: Negative.       Allergies  Ace inhibitors; Neosporin; Norvasc; and Sulfa antibiotics  Home Medications   Current Outpatient Rx  Name  Route  Sig  Dispense  Refill  . albuterol (PROVENTIL HFA;VENTOLIN HFA) 108 (90 BASE) MCG/ACT inhaler   Inhalation   Inhale 2 puffs into the lungs every 6 (six) hours as needed for wheezing or shortness of breath.   1 Inhaler   1   . aspirin 325 MG tablet   Oral   Take 325 mg by mouth daily.         . B Complex Vitamins (VITAMIN B-COMPLEX PO)   Oral   Take 1 tablet by mouth daily.         . Calcium Citrate-Vitamin D (CVS CALCIUM CITRATE +D PO)   Oral   Take 2 tablets by mouth every evening.          . cloNIDine (CATAPRES) 0.1 MG tablet   Oral   Take 1 tablet (0.1 mg total) by mouth 2 (two) times daily.   60 tablet   2   .  cycloSPORINE (RESTASIS) 0.05 % ophthalmic emulsion   Both Eyes   Place 1 drop into both eyes 2 (two) times daily.          . Ergocalciferol (VITAMIN D2) 2000 UNITS TABS   Oral   Take 2 tablets by mouth daily.         . furosemide (LASIX) 40 MG tablet   Oral   Take 1 tablet (40 mg total) by mouth daily.   30 tablet   5   . levothyroxine (SYNTHROID, LEVOTHROID) 88 MCG tablet   Oral   Take 1 tablet (88 mcg total) by mouth daily.   30 tablet   3   . losartan (COZAAR) 100 MG tablet      TAKE 1 TABLET (100 MG TOTAL) BY MOUTH DAILY.   30 tablet   3   . Magnesium 250 MG TABS   Oral   Take 1 tablet by mouth every evening.          . metFORMIN (GLUCOPHAGE) 500 MG tablet   Oral   Take 1 tablet (500 mg total) by mouth 2 (two) times daily with a meal.   60 tablet   3   . metoprolol  (LOPRESSOR) 50 MG tablet      TAKE 1 TABLET (50 MG TOTAL) BY MOUTH 2 (TWO) TIMES DAILY.   60 tablet   2   . simvastatin (ZOCOR) 40 MG tablet   Oral   Take 1 tablet (40 mg total) by mouth at bedtime.   30 tablet   1   . vitamin C (ASCORBIC ACID) 500 MG tablet   Oral   Take 500 mg by mouth daily.         Marland Kitchen doxycycline (VIBRAMYCIN) 100 MG capsule   Oral   Take 1 capsule (100 mg total) by mouth 2 (two) times daily.   20 capsule   0    BP 127/80  Pulse 56  Temp(Src) 97.9 F (36.6 C) (Oral)  Resp 18  SpO2 97% Physical Exam  Nursing note and vitals reviewed. Constitutional: She appears well-developed and well-nourished.  HENT:  Head: Normocephalic and atraumatic.  Eyes: Conjunctivae are normal.  Neck: Normal range of motion.  Cardiovascular: Normal rate, regular rhythm, normal heart sounds and intact distal pulses.   Pulmonary/Chest: Effort normal and breath sounds normal. She has no wheezes.  Abdominal: Soft. Bowel sounds are normal. There is no tenderness.  Musculoskeletal: Normal range of motion.  Neurological: She is alert.  Skin: Skin is warm and dry. There is erythema.  2+ edema right lower leg,  Erythema.  Dorsalis pedis pulse intact,  Less than 3 sec cap refill.  severeral indurated varicosities medial right calf.  Positive homans sign. Changes c/w venous stasis.  Psychiatric: She has a normal mood and affect.    ED Course  Procedures (including critical care time) Labs Review Labs Reviewed  BASIC METABOLIC PANEL - Abnormal; Notable for the following:    CO2 33 (*)    Glucose, Bld 189 (*)    GFR calc non Af Amer 62 (*)    GFR calc Af Amer 71 (*)    All other components within normal limits  URINALYSIS, ROUTINE W REFLEX MICROSCOPIC  CBC WITH DIFFERENTIAL   Imaging Review No results found.   EKG Interpretation None      MDM   Final diagnoses:  Peripheral edema  Venous stasis  Cellulitis and abscess of leg    Patients labs and/or radiological  studies  were viewed and considered during the medical decision making and disposition process. Doppler US of lower extremity negative for dvt.  She was encouraged elevation, warm compresses.  Prescribed doxycycline for suspected cellulitis.  Encouraged f/u with pcp this week for recheck,  Sooner for any worsened sx.    UA negative.   Evalee Jefferson, PA-C 09/22/13 1616

## 2013-09-23 ENCOUNTER — Other Ambulatory Visit: Payer: Self-pay | Admitting: *Deleted

## 2013-09-23 DIAGNOSIS — E785 Hyperlipidemia, unspecified: Secondary | ICD-10-CM

## 2013-09-23 NOTE — Telephone Encounter (Signed)
Patient NTBS for follow up and lab work befroe refilling zocor- need to check liver function

## 2013-09-24 ENCOUNTER — Encounter: Payer: Self-pay | Admitting: Family Medicine

## 2013-09-24 ENCOUNTER — Ambulatory Visit (INDEPENDENT_AMBULATORY_CARE_PROVIDER_SITE_OTHER): Payer: Medicare Other | Admitting: Family Medicine

## 2013-09-24 VITALS — BP 136/74 | HR 59 | Temp 97.7°F | Ht 72.0 in | Wt 199.0 lb

## 2013-09-24 DIAGNOSIS — I803 Phlebitis and thrombophlebitis of lower extremities, unspecified: Secondary | ICD-10-CM

## 2013-09-24 DIAGNOSIS — E119 Type 2 diabetes mellitus without complications: Secondary | ICD-10-CM

## 2013-09-24 DIAGNOSIS — I10 Essential (primary) hypertension: Secondary | ICD-10-CM | POA: Diagnosis not present

## 2013-09-24 DIAGNOSIS — E785 Hyperlipidemia, unspecified: Secondary | ICD-10-CM

## 2013-09-24 DIAGNOSIS — E039 Hypothyroidism, unspecified: Secondary | ICD-10-CM

## 2013-09-24 MED ORDER — DOXYCYCLINE HYCLATE 100 MG PO CAPS: 100.0000 mg | ORAL_CAPSULE | Freq: Two times a day (BID) | ORAL | Status: DC

## 2013-09-24 NOTE — Patient Instructions (Signed)
Phlebitis  Phlebitis is soreness and puffiness (swelling) in a vein.   HOME CARE  · Only take medicine as told by your doctor.  · Raise (elevate) the affected limb on a pillow as told by your doctor.  · Keep a warm packs on the affected vein as told by your doctor. Do not sleep with a heating pad.  · Use special stockings or bandages around the area of the affected vein as told by your doctor. These will speed healing and keep the condition from coming back.  · Talk to your doctor about all the medicines you take.  · Get follow-up blood tests as told by your doctor.  · If the phlebitis is in your legs:  · Avoid standing or resting for long periods.  · Keep your legs moving. Raise your legs when you sit or lie.  · Do not smoke.  · Follow-up with your doctor as told.  GET HELP IF:  · You have strange bruises or bleeding.  · Your puffiness or pain in the affected area is not getting better.  · You are taking special medicine to lessen puffiness (anti-inflammatory medicine) , and you get belly pain.  GET HELP RIGHT AWAY IF:   · The phlebitis gets worse and you have more pain, puffiness (swelling), or redness.  · You have trouble breathing or have chest pain.  · You have a fever.  MAKE SURE YOU:   · Understand these instructions.  · Will watch your condition.  · Will get help right away if you are not doing well or get worse.  Document Released: 06/05/2009 Document Revised: 04/07/2013 Document Reviewed: 02/22/2013  [employer]® Patient Information ©2014 [employer].

## 2013-09-24 NOTE — Progress Notes (Signed)
Patient ID: Alexandria Irwin, female   DOB: 1946/03/25, 68 y.o.   MRN: [id number] SUBJECTIVE: CC: Chief Complaint  Patient presents with  . Follow-up    1 week  rt leg per pt negative for DVT. leg remains red and swollen pt states is better    HPI:  Follow up on phlebitis. Leg still sore and red. The swelling is a little better. Was evaluated in th eED and treated with doxycycline. Came for a recheck. No chest pain. No shortness of breath. DM: stable.  Past Medical History  Diagnosis Date  . Diabetes mellitus without complication   . Hypertension   . Thyroid disease   . Hyperlipidemia    Past Surgical History  Procedure Laterality Date  . Cholecystectomy    . Vein surgery     History   Social History  . Marital Status: Married    Spouse Name: N/A    Number of Children: N/A  . Years of Education: N/A   Occupational History  . Not on file.   Social History Main Topics  . Smoking status: Never Smoker   . Smokeless tobacco: Not on file  . Alcohol Use: No  . Drug Use: No  . Sexual Activity: Not on file   Other Topics Concern  . Not on file   Social History Narrative  . No narrative on file   Family History  Problem Relation Age of Onset  . Cancer Mother     ovarian   Current Outpatient Prescriptions on File Prior to Visit  Medication Sig Dispense Refill  . albuterol (PROVENTIL HFA;VENTOLIN HFA) 108 (90 BASE) MCG/ACT inhaler Inhale 2 puffs into the lungs every 6 (six) hours as needed for wheezing or shortness of breath.  1 Inhaler  1  . aspirin 325 MG tablet Take 325 mg by mouth daily.      . B Complex Vitamins (VITAMIN B-COMPLEX PO) Take 1 tablet by mouth daily.      . Calcium Citrate-Vitamin D (CVS CALCIUM CITRATE +D PO) Take 2 tablets by mouth every evening.       . cloNIDine (CATAPRES) 0.1 MG tablet Take 1 tablet (0.1 mg total) by mouth 2 (two) times daily.  60 tablet  2  . cycloSPORINE (RESTASIS) 0.05 % ophthalmic emulsion Place 1 drop into both eyes 2 (two) times  daily.       . Ergocalciferol (VITAMIN D2) 2000 UNITS TABS Take 2 tablets by mouth daily.      . furosemide (LASIX) 40 MG tablet Take 1 tablet (40 mg total) by mouth daily.  30 tablet  5  . levothyroxine (SYNTHROID, LEVOTHROID) 88 MCG tablet Take 1 tablet (88 mcg total) by mouth daily.  30 tablet  3  . losartan (COZAAR) 100 MG tablet TAKE 1 TABLET (100 MG TOTAL) BY MOUTH DAILY.  30 tablet  3  . Magnesium 250 MG TABS Take 1 tablet by mouth every evening.       . metFORMIN (GLUCOPHAGE) 500 MG tablet Take 1 tablet (500 mg total) by mouth 2 (two) times daily with a meal.  60 tablet  3  . metoprolol (LOPRESSOR) 50 MG tablet TAKE 1 TABLET (50 MG TOTAL) BY MOUTH 2 (TWO) TIMES DAILY.  60 tablet  2  . simvastatin (ZOCOR) 40 MG tablet Take 1 tablet (40 mg total) by mouth at bedtime.  30 tablet  1  . vitamin C (ASCORBIC ACID) 500 MG tablet Take 500 mg by mouth daily.  No current facility-administered medications on file prior to visit.   Allergies  Allergen Reactions  . Ace Inhibitors   . Neosporin [Neomycin-Bacitracin Zn-Polymyx]   . Norvasc [Amlodipine]   . Sulfa Antibiotics    Immunization History  Administered Date(s) Administered  . Influenza Whole 08/02/2011  . Influenza,inj,Quad PF,36+ Mos 05/04/2013   Prior to Admission medications   Medication Sig Start Date End Date Taking? Authorizing Provider  albuterol (PROVENTIL HFA;VENTOLIN HFA) 108 (90 BASE) MCG/ACT inhaler Inhale 2 puffs into the lungs every 6 (six) hours as needed for wheezing or shortness of breath. 06/19/13   Erby Pian, FNP  aspirin 325 MG tablet Take 325 mg by mouth daily.    Historical Provider, MD  B Complex Vitamins (VITAMIN B-COMPLEX PO) Take 1 tablet by mouth daily.    Historical Provider, MD  Calcium Citrate-Vitamin D (CVS CALCIUM CITRATE +D PO) Take 2 tablets by mouth every evening.     Historical Provider, MD  cloNIDine (CATAPRES) 0.1 MG tablet Take 1 tablet (0.1 mg total) by mouth 2 (two) times daily.  08/19/13   Erby Pian, FNP  cycloSPORINE (RESTASIS) 0.05 % ophthalmic emulsion Place 1 drop into both eyes 2 (two) times daily.     Historical Provider, MD  doxycycline (VIBRAMYCIN) 100 MG capsule Take 1 capsule (100 mg total) by mouth 2 (two) times daily. 09/18/13   Evalee Jefferson, PA-C  Ergocalciferol (VITAMIN D2) 2000 UNITS TABS Take 2 tablets by mouth daily.    Historical Provider, MD  furosemide (LASIX) 40 MG tablet Take 1 tablet (40 mg total) by mouth daily. 07/05/13   Erby Pian, FNP  levothyroxine (SYNTHROID, LEVOTHROID) 88 MCG tablet Take 1 tablet (88 mcg total) by mouth daily. 02/18/13   Erby Pian, FNP  losartan (COZAAR) 100 MG tablet TAKE 1 TABLET (100 MG TOTAL) BY MOUTH DAILY. 06/09/13   Erby Pian, FNP  Magnesium 250 MG TABS Take 1 tablet by mouth every evening.     Historical Provider, MD  metFORMIN (GLUCOPHAGE) 500 MG tablet Take 1 tablet (500 mg total) by mouth 2 (two) times daily with a meal. 06/16/13   Erby Pian, FNP  metoprolol (LOPRESSOR) 50 MG tablet TAKE 1 TABLET (50 MG TOTAL) BY MOUTH 2 (TWO) TIMES DAILY. 09/17/13   Erby Pian, FNP  simvastatin (ZOCOR) 40 MG tablet Take 1 tablet (40 mg total) by mouth at bedtime. 07/28/13   Erby Pian, FNP  vitamin C (ASCORBIC ACID) 500 MG tablet Take 500 mg by mouth daily.    Historical Provider, MD     ROS: As above in the HPI. All other systems are stable or negative.  OBJECTIVE: APPEARANCE:  Patient in no acute distress.The patient appeared well nourished and normally developed. Acyanotic. Waist: VITAL SIGNS:BP 136/74  Pulse 59  Temp(Src) 97.7 F (36.5 C) (Oral)  Ht 6' (1.829 m)  Wt 199 lb (90.266 kg)  BMI 26.98 kg/m2 WF  SKIN: warm and  Dry without overt rashes, tattoos and scars  HEAD and Neck: without JVD, Head and scalp: normal Eyes:No scleral icterus. Fundi normal, eye movements normal. Ears: Auricle normal, canal normal, Tympanic membranes normal, insufflation normal. Nose:  normal Throat: normal Neck & thyroid: normal  CHEST & LUNGS: Chest wall: normal Lungs: Clear  CVS: Reveals the PMI to be normally located. Regular rhythm, First and Second Heart sounds are normal,  absence of murmurs, rubs or gallops. Peripheral vasculature: Radial pulses: normal Dorsal pedis pulses: normal Posterior pulses: normal  ABDOMEN:  Appearance: normal Benign, no organomegaly, no masses, no Abdominal Aortic enlargement. No Guarding , no rebound. No Bruits. Bowel sounds: normal  RECTAL: N/A GU: N/A  EXTREMETIES: right calf swollen. Induration of the medial calf and there are thrombosed tender superficial veins.  Reviewed records from the ED in [facility]. Doppler scan r/o DVT  ASSESSMENT:  Thrombophlebitis leg - Plan: doxycycline (VIBRAMYCIN) 100 MG capsule  Other and unspecified hyperlipidemia  HTN (hypertension)  Diabetes  Unspecified hypothyroidism  PLAN:  Compression JOnes wrap with a ace bandage used. With releif to patient.  No orders of the defined types were placed in this encounter.   Meds ordered this encounter  Medications  . doxycycline (VIBRAMYCIN) 100 MG capsule    Sig: Take 1 capsule (100 mg total) by mouth 2 (two) times daily.    Dispense:  20 capsule    Refill:  0    Order Specific Question:  Supervising Provider    Answer:  Nat Christen [[id number]]   Medications Discontinued During This Encounter  Medication Reason  . doxycycline (VIBRAMYCIN) 100 MG capsule Reorder   Return in about 1 week (around 10/01/2013) for recheck right leg.  Amori Colomb P. Jacelyn Grip, M.D.

## 2013-09-27 ENCOUNTER — Other Ambulatory Visit: Payer: Self-pay | Admitting: *Deleted

## 2013-09-27 DIAGNOSIS — E785 Hyperlipidemia, unspecified: Secondary | ICD-10-CM

## 2013-09-27 MED ORDER — SIMVASTATIN 40 MG PO TABS: 40.0000 mg | ORAL_TABLET | Freq: Every day | ORAL | Status: DC

## 2013-09-28 NOTE — ED Provider Notes (Signed)
Medical screening examination/treatment/procedure(s) were performed by non-physician practitioner and as supervising physician I was immediately available for consultation/collaboration.   EKG Interpretation None       Amberlyn Martinezgarcia, MD 09/28/13 1931 

## 2013-09-30 ENCOUNTER — Encounter: Payer: Self-pay | Admitting: Family Medicine

## 2013-09-30 ENCOUNTER — Ambulatory Visit (INDEPENDENT_AMBULATORY_CARE_PROVIDER_SITE_OTHER): Payer: Medicare Other | Admitting: Family Medicine

## 2013-09-30 ENCOUNTER — Other Ambulatory Visit: Payer: Self-pay | Admitting: *Deleted

## 2013-09-30 VITALS — BP 119/76 | HR 52 | Temp 96.2°F | Ht 61.0 in | Wt 199.4 lb

## 2013-09-30 DIAGNOSIS — I803 Phlebitis and thrombophlebitis of lower extremities, unspecified: Secondary | ICD-10-CM | POA: Diagnosis not present

## 2013-09-30 DIAGNOSIS — I10 Essential (primary) hypertension: Secondary | ICD-10-CM

## 2013-09-30 DIAGNOSIS — E785 Hyperlipidemia, unspecified: Secondary | ICD-10-CM | POA: Diagnosis not present

## 2013-09-30 DIAGNOSIS — E559 Vitamin D deficiency, unspecified: Secondary | ICD-10-CM

## 2013-09-30 DIAGNOSIS — E119 Type 2 diabetes mellitus without complications: Secondary | ICD-10-CM | POA: Diagnosis not present

## 2013-09-30 DIAGNOSIS — E039 Hypothyroidism, unspecified: Secondary | ICD-10-CM

## 2013-09-30 LAB — POCT GLYCOSYLATED HEMOGLOBIN (HGB A1C): Hemoglobin A1C: 7.3

## 2013-09-30 LAB — POCT UA - MICROALBUMIN: Microalbumin Ur, POC: NEGATIVE mg/L

## 2013-09-30 MED ORDER — LOSARTAN POTASSIUM 100 MG PO TABS: ORAL_TABLET | ORAL | Status: DC

## 2013-09-30 NOTE — Progress Notes (Signed)
Patient ID: Alexandria Irwin, female   DOB: 04-30-1946, 68 y.o.   MRN: [id number] SUBJECTIVE: CC: Chief Complaint  Patient presents with  . recheck right leg    HPI:  Here to recheck her leg which has had thrombophlebitis and cellulitis. Still a little sore but definitely better.no fever. Swelling controlled and leg less sore with ACE wrap. She has ordered stockings through [employer], however they are not Rx type.  Has not followed up on her DM and other medical problems in a while. Need test strips and lancets.  Patient is here for follow up of Diabetes Mellitus: Symptoms evaluated: Denies Nocturia ,Denies Urinary Frequency , denies Blurred vision ,deniesDizziness,denies.Dysuria,denies paresthesias, denies extremity pain or ulcers.Marland Kitchendenies chest pain. has had an annual eye exam. do check the feet. Does check CBGs. Average CBG:not checked because she needed a new glucometer and test strips. Denies episodes of hypoglycemia. Does have an emergency hypoglycemic plan. admits toCompliance with medications. Denies Problems with medications.  Past Medical History  Diagnosis Date  . Diabetes mellitus without complication   . Hypertension   . Thyroid disease   . Hyperlipidemia   . Vitamin D deficiency    Past Surgical History  Procedure Laterality Date  . Cholecystectomy    . Vein surgery     History   Social History  . Marital Status: Married    Spouse Name: N/A    Number of Children: N/A  . Years of Education: N/A   Occupational History  . Not on file.   Social History Main Topics  . Smoking status: Never Smoker   . Smokeless tobacco: Not on file  . Alcohol Use: No  . Drug Use: No  . Sexual Activity: Not on file   Other Topics Concern  . Not on file   Social History Narrative  . No narrative on file   Family History  Problem Relation Age of Onset  . Cancer Mother     ovarian   Current Outpatient Prescriptions on File Prior to Visit  Medication Sig Dispense Refill   . albuterol (PROVENTIL HFA;VENTOLIN HFA) 108 (90 BASE) MCG/ACT inhaler Inhale 2 puffs into the lungs every 6 (six) hours as needed for wheezing or shortness of breath.  1 Inhaler  1  . aspirin 325 MG tablet Take 325 mg by mouth daily.      . B Complex Vitamins (VITAMIN B-COMPLEX PO) Take 1 tablet by mouth daily.      . Calcium Citrate-Vitamin D (CVS CALCIUM CITRATE +D PO) Take 2 tablets by mouth every evening.       . cloNIDine (CATAPRES) 0.1 MG tablet Take 1 tablet (0.1 mg total) by mouth 2 (two) times daily.  60 tablet  2  . cycloSPORINE (RESTASIS) 0.05 % ophthalmic emulsion Place 1 drop into both eyes 2 (two) times daily.       Marland Kitchen doxycycline (VIBRAMYCIN) 100 MG capsule Take 1 capsule (100 mg total) by mouth 2 (two) times daily.  20 capsule  0  . Ergocalciferol (VITAMIN D2) 2000 UNITS TABS Take 2 tablets by mouth daily.      . furosemide (LASIX) 40 MG tablet Take 1 tablet (40 mg total) by mouth daily.  30 tablet  5  . levothyroxine (SYNTHROID, LEVOTHROID) 88 MCG tablet Take 1 tablet (88 mcg total) by mouth daily.  30 tablet  3  . losartan (COZAAR) 100 MG tablet TAKE 1 TABLET (100 MG TOTAL) BY MOUTH DAILY.  30 tablet  3  . Magnesium 250 MG  TABS Take 1 tablet by mouth every evening.       . metFORMIN (GLUCOPHAGE) 500 MG tablet Take 1 tablet (500 mg total) by mouth 2 (two) times daily with a meal.  60 tablet  3  . metoprolol (LOPRESSOR) 50 MG tablet TAKE 1 TABLET (50 MG TOTAL) BY MOUTH 2 (TWO) TIMES DAILY.  60 tablet  2  . simvastatin (ZOCOR) 40 MG tablet Take 1 tablet (40 mg total) by mouth at bedtime.  30 tablet  1  . vitamin C (ASCORBIC ACID) 500 MG tablet Take 500 mg by mouth daily.       No current facility-administered medications on file prior to visit.   Allergies  Allergen Reactions  . Ace Inhibitors   . Neosporin [Neomycin-Bacitracin Zn-Polymyx]   . Norvasc [Amlodipine]   . Sulfa Antibiotics    Immunization History  Administered Date(s) Administered  . Influenza Whole  08/02/2011  . Influenza,inj,Quad PF,36+ Mos 05/04/2013   Prior to Admission medications   Medication Sig Start Date End Date Taking? Authorizing Provider  albuterol (PROVENTIL HFA;VENTOLIN HFA) 108 (90 BASE) MCG/ACT inhaler Inhale 2 puffs into the lungs every 6 (six) hours as needed for wheezing or shortness of breath. 06/19/13  Yes Mae Loree Fee, FNP  aspirin 325 MG tablet Take 325 mg by mouth daily.   Yes Historical Provider, MD  B Complex Vitamins (VITAMIN B-COMPLEX PO) Take 1 tablet by mouth daily.   Yes Historical Provider, MD  Calcium Citrate-Vitamin D (CVS CALCIUM CITRATE +D PO) Take 2 tablets by mouth every evening.    Yes Historical Provider, MD  cloNIDine (CATAPRES) 0.1 MG tablet Take 1 tablet (0.1 mg total) by mouth 2 (two) times daily. 08/19/13  Yes Mae Loree Fee, FNP  cycloSPORINE (RESTASIS) 0.05 % ophthalmic emulsion Place 1 drop into both eyes 2 (two) times daily.    Yes Historical Provider, MD  doxycycline (VIBRAMYCIN) 100 MG capsule Take 1 capsule (100 mg total) by mouth 2 (two) times daily. 09/24/13  Yes Vernie Shanks, MD  Ergocalciferol (VITAMIN D2) 2000 UNITS TABS Take 2 tablets by mouth daily.   Yes Historical Provider, MD  furosemide (LASIX) 40 MG tablet Take 1 tablet (40 mg total) by mouth daily. 07/05/13  Yes Mae Loree Fee, FNP  levothyroxine (SYNTHROID, LEVOTHROID) 88 MCG tablet Take 1 tablet (88 mcg total) by mouth daily. 02/18/13  Yes Mae Loree Fee, FNP  losartan (COZAAR) 100 MG tablet TAKE 1 TABLET (100 MG TOTAL) BY MOUTH DAILY. 06/09/13  Yes Mae Loree Fee, FNP  Magnesium 250 MG TABS Take 1 tablet by mouth every evening.    Yes Historical Provider, MD  metFORMIN (GLUCOPHAGE) 500 MG tablet Take 1 tablet (500 mg total) by mouth 2 (two) times daily with a meal. 06/16/13  Yes Mae Loree Fee, FNP  metoprolol (LOPRESSOR) 50 MG tablet TAKE 1 TABLET (50 MG TOTAL) BY MOUTH 2 (TWO) TIMES DAILY. 09/17/13  Yes Mae Loree Fee, FNP  simvastatin (ZOCOR) 40 MG tablet Take  1 tablet (40 mg total) by mouth at bedtime. 09/27/13  Yes Vernie Shanks, MD  vitamin C (ASCORBIC ACID) 500 MG tablet Take 500 mg by mouth daily.   Yes Historical Provider, MD     ROS: As above in the HPI. All other systems are stable or negative.  OBJECTIVE: APPEARANCE:  Patient in no acute distress.The patient appeared well nourished and normally developed. Acyanotic. Waist: VITAL SIGNS:BP 119/76  Pulse 52  Temp(Src) 96.2 F (35.7 C) (Oral)  Ht  '5\' 1"'  (1.549 m)  Wt 199 lb 6.4 oz (90.447 kg)  BMI 37.70 kg/m2  Short stature Obese WF  SKIN: warm and  Dry without overt rashes, tattoos and scars. The medial right leg has significant reduction in the redness. She has a pink color to the areas where she had the phlebitis and cellulitis.  HEAD and Neck: without JVD, Head and scalp: normal Eyes:No scleral icterus. Fundi normal, eye movements normal. Ears: Auricle normal, canal normal, Tympanic membranes normal, insufflation normal. Nose: normal Throat: normal Neck & thyroid: normal  CHEST & LUNGS: Chest wall: normal Lungs: Clear  CVS: Reveals the PMI to be normally located. Regular rhythm, First and Second Heart sounds are normal,  absence of murmurs, rubs or gallops. Peripheral vasculature: Radial pulses: normal Dorsal pedis pulses: normal Posterior pulses: normal  ABDOMEN:  Appearance: Obese Benign, no organomegaly, no masses, no Abdominal Aortic enlargement. No Guarding , no rebound. No Bruits. Bowel sounds: normal  RECTAL: N/A GU: N/A  EXTREMETIES: 1 + edematous bilaterally, right greater than left The phlebitis veins are softer.  NEUROLOGIC: oriented to time,place and person; nonfocal. Cranial Nerves are normal.  Results for orders placed during the hospital encounter of 09/18/13  URINALYSIS, ROUTINE W REFLEX MICROSCOPIC      Result Value Ref Range   Color, Urine YELLOW  YELLOW   APPearance CLEAR  CLEAR   Specific Gravity, Urine 1.015  1.005 - 1.030   pH  6.5  5.0 - 8.0   Glucose, UA NEGATIVE  NEGATIVE mg/dL   Hgb urine dipstick NEGATIVE  NEGATIVE   Bilirubin Urine NEGATIVE  NEGATIVE   Ketones, ur NEGATIVE  NEGATIVE mg/dL   Protein, ur NEGATIVE  NEGATIVE mg/dL   Urobilinogen, UA 0.2  0.0 - 1.0 mg/dL   Nitrite NEGATIVE  NEGATIVE   Leukocytes, UA NEGATIVE  NEGATIVE  CBC WITH DIFFERENTIAL      Result Value Ref Range   WBC 6.1  4.0 - 10.5 K/uL   RBC 4.17  3.87 - 5.11 MIL/uL   Hemoglobin 13.4  12.0 - 15.0 g/dL   HCT 38.9  36.0 - 46.0 %   MCV 93.3  78.0 - 100.0 fL   MCH 32.1  26.0 - 34.0 pg   MCHC 34.4  30.0 - 36.0 g/dL   RDW 13.0  11.5 - 15.5 %   Platelets 208  150 - 400 K/uL   Neutrophils Relative % 52  43 - 77 %   Neutro Abs 3.2  1.7 - 7.7 K/uL   Lymphocytes Relative 39  12 - 46 %   Lymphs Abs 2.4  0.7 - 4.0 K/uL   Monocytes Relative 6  3 - 12 %   Monocytes Absolute 0.4  0.1 - 1.0 K/uL   Eosinophils Relative 3  0 - 5 %   Eosinophils Absolute 0.2  0.0 - 0.7 K/uL   Basophils Relative 1  0 - 1 %   Basophils Absolute 0.0  0.0 - 0.1 K/uL  BASIC METABOLIC PANEL      Result Value Ref Range   Sodium 142  137 - 147 mEq/L   Potassium 4.3  3.7 - 5.3 mEq/L   Chloride 98  96 - 112 mEq/L   CO2 33 (*) 19 - 32 mEq/L   Glucose, Bld 189 (*) 70 - 99 mg/dL   BUN 14  6 - 23 mg/dL   Creatinine, Ser 0.92  0.50 - 1.10 mg/dL   Calcium 9.7  8.4 - 10.5 mg/dL  GFR calc non Af Amer 62 (*) >90 mL/min   GFR calc Af Amer 71 (*) >90 mL/min    ASSESSMENT:  Thrombophlebitis leg  Other and unspecified hyperlipidemia - Plan: Lipid panel  HTN (hypertension) - Plan: CMP14+EGFR  Diabetes - Plan: POCT glycosylated hemoglobin (Hb A1C), POCT UA - Microalbumin  Unspecified hypothyroidism - Plan: TSH  Vitamin D deficiency - Plan: Vit D  25 hydroxy (rtn osteoporosis monitoring)  Leg is getting better   PLAN: Rx for medium pressure compression thigh high stockings #1 pair refill x 3 prn Rx for [employer] test strips # 100 refill x3 prn. Rx for new  glucometer  DM footcare in the AVS and a reminder to check feet daily.       Dr Paula Libra Recommendations  For nutrition information, I recommend books:  1).Eat to Live by Dr Excell Seltzer. 2).Prevent and Reverse Heart Disease by Dr Karl Luke. 3) Dr Janene Harvey Book:  Program to Reverse Diabetes  Exercise recommendations are:  If unable to walk, then the patient can exercise in a chair 3 times a day. By flapping arms like a bird gently and raising legs outwards to the front.  If ambulatory, the patient can go for walks for 30 minutes 3 times a week. Then increase the intensity and duration as tolerated.  Goal is to try to attain exercise frequency to 5 times a week.  If applicable: Best to perform resistance exercises (machines or weights) 2 days a week and cardio type exercises 3 days per week.   Orders Placed This Encounter  Procedures  . CMP14+EGFR  . Lipid panel  . TSH  . Vit D  25 hydroxy (rtn osteoporosis monitoring)  . POCT glycosylated hemoglobin (Hb A1C)  . POCT UA - Microalbumin   Same medications. No orders of the defined types were placed in this encounter.   There are no discontinued medications. Return in about 4 weeks (around 10/28/2013) for Recheck medical problems. With Mae if possible.  Theodor Mustin P. Jacelyn Grip, M.D.

## 2013-09-30 NOTE — Patient Instructions (Signed)
Diabetes and Foot Care Diabetes may cause you to have problems because of poor blood supply (circulation) to your feet and legs. This may cause the skin on your feet to become thinner, break easier, and heal more slowly. Your skin may become dry, and the skin may peel and crack. You may also have nerve damage in your legs and feet causing decreased feeling in them. You may not notice minor injuries to your feet that could lead to infections or more serious problems. Taking care of your feet is one of the most important things you can do for yourself.  HOME CARE INSTRUCTIONS  Wear shoes at all times, even in the house. Do not go barefoot. Bare feet are easily injured.  Check your feet daily for blisters, cuts, and redness. If you cannot see the bottom of your feet, use a mirror or ask someone for help.  Wash your feet with warm water (do not use hot water) and mild soap. Then pat your feet and the areas between your toes until they are completely dry. Do not soak your feet as this can dry your skin.  Apply a moisturizing lotion or petroleum jelly (that does not contain alcohol and is unscented) to the skin on your feet and to dry, brittle toenails. Do not apply lotion between your toes.  Trim your toenails straight across. Do not dig under them or around the cuticle. File the edges of your nails with an emery board or nail file.  Do not cut corns or calluses or try to remove them with medicine.  Wear clean socks or stockings every day. Make sure they are not too tight. Do not wear knee-high stockings since they may decrease blood flow to your legs.  Wear shoes that fit properly and have enough cushioning. To break in new shoes, wear them for just a few hours a day. This prevents you from injuring your feet. Always look in your shoes before you put them on to be sure there are no objects inside.  Do not cross your legs. This may decrease the blood flow to your feet.  If you find a minor scrape,  cut, or break in the skin on your feet, keep it and the skin around it clean and dry. These areas may be cleansed with mild soap and water. Do not cleanse the area with peroxide, alcohol, or iodine.  When you remove an adhesive bandage, be sure not to damage the skin around it.  If you have a wound, look at it several times a day to make sure it is healing.  Do not use heating pads or hot water bottles. They may burn your skin. If you have lost feeling in your feet or legs, you may not know it is happening until it is too late.  Make sure your health care provider performs a complete foot exam at least annually or more often if you have foot problems. Report any cuts, sores, or bruises to your health care provider immediately. SEEK MEDICAL CARE IF:   You have an injury that is not healing.  You have cuts or breaks in the skin.  You have an ingrown nail.  You notice redness on your legs or feet.  You feel burning or tingling in your legs or feet.  You have pain or cramps in your legs and feet.  Your legs or feet are numb.  Your feet always feel cold. SEEK IMMEDIATE MEDICAL CARE IF:   There is increasing redness,   swelling, or pain in or around a wound.  There is a red line that goes up your leg.  Pus is coming from a wound.  You develop a fever or as directed by your health care provider.  You notice a bad smell coming from an ulcer or wound. Document Released: 06/14/2000 Document Revised: 02/17/2013 Document Reviewed: 11/24/2012 [facility] Patient Information 2014 [place].        Dr Paula Libra Recommendations  For nutrition information, I recommend books:  1).Eat to Live by Dr Excell Seltzer. 2).Prevent and Reverse Heart Disease by Dr Karl Luke. 3) Dr Janene Harvey Book:  Program to Reverse Diabetes  Exercise recommendations are:  If unable to walk, then the patient can exercise in a chair 3 times a day. By flapping arms like a bird gently and  raising legs outwards to the front.  If ambulatory, the patient can go for walks for 30 minutes 3 times a week. Then increase the intensity and duration as tolerated.  Goal is to try to attain exercise frequency to 5 times a week.  If applicable: Best to perform resistance exercises (machines or weights) 2 days a week and cardio type exercises 3 days per week.

## 2013-10-01 ENCOUNTER — Other Ambulatory Visit: Payer: Self-pay | Admitting: Family Medicine

## 2013-10-01 DIAGNOSIS — E039 Hypothyroidism, unspecified: Secondary | ICD-10-CM

## 2013-10-01 LAB — TSH: TSH: 5.3 u[IU]/mL — ABNORMAL HIGH (ref 0.450–4.500)

## 2013-10-01 LAB — CMP14+EGFR
ALT: 31 IU/L (ref 0–32)
AST: 32 IU/L (ref 0–40)
Albumin/Globulin Ratio: 1.6 (ref 1.1–2.5)
Albumin: 4.2 g/dL (ref 3.5–4.8)
Alkaline Phosphatase: 39 IU/L (ref 39–117)
BUN/Creatinine Ratio: 18 (ref 11–26)
BUN: 16 mg/dL (ref 8–27)
CO2: 26 mmol/L (ref 18–29)
Calcium: 10.2 mg/dL (ref 8.7–10.3)
Chloride: 96 mmol/L — ABNORMAL LOW (ref 97–108)
Creatinine, Ser: 0.89 mg/dL (ref 0.57–1.00)
GFR calc Af Amer: 76 mL/min/{1.73_m2} (ref >59)
GFR calc non Af Amer: 66 mL/min/{1.73_m2} (ref >59)
Globulin, Total: 2.6 g/dL (ref 1.5–4.5)
Glucose: 144 mg/dL — ABNORMAL HIGH (ref 65–99)
Potassium: 4.3 mmol/L (ref 3.5–5.2)
Sodium: 140 mmol/L (ref 134–144)
Total Bilirubin: 0.7 mg/dL (ref 0.0–1.2)
Total Protein: 6.8 g/dL (ref 6.0–8.5)

## 2013-10-01 LAB — LIPID PANEL
Chol/HDL Ratio: 3.1 ratio units (ref 0.0–4.4)
Cholesterol, Total: 113 mg/dL (ref 100–199)
HDL: 37 mg/dL — ABNORMAL LOW (ref >39)
LDL Calculated: 28 mg/dL (ref 0–99)
Triglycerides: 240 mg/dL — ABNORMAL HIGH (ref 0–149)
VLDL Cholesterol Cal: 48 mg/dL — ABNORMAL HIGH (ref 5–40)

## 2013-10-01 LAB — VITAMIN D 25 HYDROXY (VIT D DEFICIENCY, FRACTURES): Vit D, 25-Hydroxy: 39.6 ng/mL (ref 30.0–100.0)

## 2013-10-01 MED ORDER — LEVOTHYROXINE SODIUM 100 MCG PO TABS: 100.0000 ug | ORAL_TABLET | Freq: Every day | ORAL | Status: DC

## 2013-10-01 NOTE — Progress Notes (Signed)
Quick Note:  Call Patient Labs that are abnormal: The thyroid level reflects low thyroid hormone and we need to increase the dose. This would affect the cholesterol. This is abnormal but we need to correct the thyroid first. The HGBA1C is also not at goal. It is a little high  The rest are at goal  Recommendations: Increase the thyroid dose. Ordered in EPIC. Diet weight loss and exercise 30 minutes a day for 5 days a week will help improve her medical problems. Keep follow up in 4 weeks.  ______

## 2013-10-05 ENCOUNTER — Telehealth: Payer: Self-pay | Admitting: Family Medicine

## 2013-10-05 DIAGNOSIS — E119 Type 2 diabetes mellitus without complications: Secondary | ICD-10-CM

## 2013-10-05 MED ORDER — GLUCOSE BLOOD VI STRP: 1.0000 | ORAL_STRIP | Freq: Every day | Status: DC

## 2013-10-05 MED ORDER — ONETOUCH ULTRASOFT LANCETS MISC: 1.0000 | Freq: Every day | Status: DC

## 2013-10-05 NOTE — Telephone Encounter (Signed)
Patient tests once a day. She was given a One Touch Verio meter at our office. She needs script for test strips and lancets with directions and diagnosis code. Sent over electronically.

## 2013-10-28 ENCOUNTER — Ambulatory Visit (INDEPENDENT_AMBULATORY_CARE_PROVIDER_SITE_OTHER): Payer: Medicare Other | Admitting: Family Medicine

## 2013-10-28 ENCOUNTER — Encounter: Payer: Self-pay | Admitting: Family Medicine

## 2013-10-28 VITALS — BP 109/70 | HR 65 | Temp 98.9°F | Ht 61.0 in | Wt 194.0 lb

## 2013-10-28 DIAGNOSIS — E119 Type 2 diabetes mellitus without complications: Secondary | ICD-10-CM

## 2013-10-28 DIAGNOSIS — J309 Allergic rhinitis, unspecified: Secondary | ICD-10-CM

## 2013-10-28 DIAGNOSIS — E785 Hyperlipidemia, unspecified: Secondary | ICD-10-CM

## 2013-10-28 DIAGNOSIS — I10 Essential (primary) hypertension: Secondary | ICD-10-CM | POA: Diagnosis not present

## 2013-10-28 DIAGNOSIS — J302 Other seasonal allergic rhinitis: Secondary | ICD-10-CM

## 2013-10-28 DIAGNOSIS — I803 Phlebitis and thrombophlebitis of lower extremities, unspecified: Secondary | ICD-10-CM | POA: Diagnosis not present

## 2013-10-28 DIAGNOSIS — E559 Vitamin D deficiency, unspecified: Secondary | ICD-10-CM

## 2013-10-28 MED ORDER — LORATADINE 10 MG PO TABS: 10.0000 mg | ORAL_TABLET | Freq: Every day | ORAL | Status: DC

## 2013-10-28 NOTE — Patient Instructions (Signed)
Allergic Rhinitis Allergic rhinitis is when the mucous membranes in the nose respond to allergens. Allergens are particles in the air that cause your body to have an allergic reaction. This causes you to release allergic antibodies. Through a chain of events, these eventually cause you to release histamine into the blood stream. Although meant to protect the body, it is this release of histamine that causes your discomfort, such as frequent sneezing, congestion, and an itchy, runny nose.  CAUSES  Seasonal allergic rhinitis (hay fever) is caused by pollen allergens that may come from grasses, trees, and weeds. Year-round allergic rhinitis (perennial allergic rhinitis) is caused by allergens such as house dust mites, pet dander, and mold spores.  SYMPTOMS   Nasal stuffiness (congestion).  Itchy, runny nose with sneezing and tearing of the eyes. DIAGNOSIS  Your health care provider can help you determine the allergen or allergens that trigger your symptoms. If you and your health care provider are unable to determine the allergen, skin or blood testing may be used. TREATMENT  Allergic Rhinitis does not have a cure, but it can be controlled by:  Medicines and allergy shots (immunotherapy).  Avoiding the allergen. Hay fever may often be treated with antihistamines in pill or nasal spray forms. Antihistamines block the effects of histamine. There are over-the-counter medicines that may help with nasal congestion and swelling around the eyes. Check with your health care provider before taking or giving this medicine.  If avoiding the allergen or the medicine prescribed do not work, there are many new medicines your health care provider can prescribe. Stronger medicine may be used if initial measures are ineffective. Desensitizing injections can be used if medicine and avoidance does not work. Desensitization is when a patient is given ongoing shots until the body becomes less sensitive to the allergen.  Make sure you follow up with your health care provider if problems continue. HOME CARE INSTRUCTIONS It is not possible to completely avoid allergens, but you can reduce your symptoms by taking steps to limit your exposure to them. It helps to know exactly what you are allergic to so that you can avoid your specific triggers. SEEK MEDICAL CARE IF:   You have a fever.  You develop a cough that does not stop easily (persistent).  You have shortness of breath.  You start wheezing.  Symptoms interfere with normal daily activities. Document Released: 03/12/2001 Document Revised: 04/07/2013 Document Reviewed: 02/22/2013 [facility] Patient Information 2014 [place].   Phlebitis Phlebitis is soreness and swelling (inflammation) of a vein. This can occur in your arms, legs, or torso (trunk), as well as deeper inside your body. Phlebitis is usually not serious when it occurs close to the surface of the body. However, it can cause serious problems when it occurs in a vein deeper inside the body. CAUSES  Phlebitis can be triggered by various things, including:   Reduced blood flow through your veins. This can happen with:  Bed rest over a long period.  Long-distance travel.  Injury.  Surgery.  Being overweight (obese) or pregnant.  Having an IV tube put in the vein and getting certain medicines through the vein.  Cancer and cancer treatment.  Use of illegal drugs taken through the vein.  Inflammatory diseases.  Inherited (genetic) diseases that increase the risk of blood clots.  Hormone therapy, such as birth control pills. SIGNS AND SYMPTOMS   Red, tender, swollen, and painful area on your skin. Usually, the area will be long and narrow.  Firmness  along the center of the affected area. This can indicate that a blood clot has formed.  Low-grade fever. DIAGNOSIS  A health care provider can usually diagnose phlebitis by examining the affected area and asking about your  symptoms. To check for infection or blood clots, your health care provider may order blood tests or an ultrasound exam of the area. Blood tests and your family history may also indicate if you have an underlying genetic disease that causes blood clots. Occasionally, a piece of tissue is taken from the body (biopsy sample) if an unusual cause of phlebitis is suspected. TREATMENT  Treatment will vary depending on the severity of the condition and the area of the body affected. Treatment may include:  Use of a warm compress or heating pad.  Use of compression stockings or bandages.  Anti-inflammatory medicines.  Removal of any IV tube that may be causing the problem.  Medicines that kill germs (antibiotics) if an infection is present.  Blood-thinning medicines if a blood clot is suspected or present.  In rare cases, surgery may be needed to remove damaged sections of vein. HOME CARE INSTRUCTIONS   Only take over-the-counter or prescription medicines as directed by your health care provider. Take all medicines exactly as prescribed.  Raise (elevate) the affected area above the level of your heart as directed by your health care provider.  Apply a warm compress or heating pad to the affected area as directed by your health care provider. Do not sleep with the heating pad.  Use compression stockings or bandages as directed. These will speed healing and prevent the condition from coming back.  If you are on blood thinners:  Get follow-up blood tests as directed by your health care provider.  Check with your health care provider before using any new medicines.  Carry a medical alert card or wear your medical alert jewelry to show that you are on blood thinners.  For phlebitis in the legs:  Avoid prolonged standing or bed rest.  Keep your legs moving. Raise your legs when sitting or lying.  Do not smoke.  Women, particularly those over the age of 81, should consider the risks and  benefits of taking the contraceptive pill. This kind of hormone treatment can increase your risk for blood clots.  Follow up with your health care provider as directed. SEEK MEDICAL CARE IF:   You have unusual bruising or any bleeding problems.  Your swelling or pain in the affected area is not improving.  You are on anti-inflammatory medicine, and you develop belly (abdominal) pain. SEEK IMMEDIATE MEDICAL CARE IF:   You have a sudden onset of chest pain or difficulty breathing.  You have a fever or persistent symptoms for more than 2 3 days.  You have a fever and your symptoms suddenly get worse. MAKE SURE YOU:  Understand these instructions.  Will watch your condition.  Will get help right away if you are not doing well or get worse. Document Released: 06/11/2001 Document Revised: 04/07/2013 Document Reviewed: 02/22/2013 [facility] Patient Information 2014 [place].

## 2013-10-28 NOTE — Progress Notes (Signed)
Patient ID: Alexandria Irwin, female   DOB: 1946/02/14, 68 y.o.   MRN: [id number] SUBJECTIVE: CC: Chief Complaint  Patient presents with  . Follow-up    4 week rck on thrombophlebitis    HPI: Here for follow up of phlebitis of the right legs. Wears it daily but took it off to let the provider see the legs. The legs are doing so much better.  Also, having a flare up of seasonal allergies but cannot stand nose sprays. Wanted to know what to take.  Other problems  Stable.   Past Medical History  Diagnosis Date  . Diabetes mellitus without complication   . Hypertension   . Thyroid disease   . Hyperlipidemia   . Vitamin D deficiency    Past Surgical History  Procedure Laterality Date  . Cholecystectomy    . Vein surgery     History   Social History  . Marital Status: Married    Spouse Name: N/A    Number of Children: N/A  . Years of Education: N/A   Occupational History  . Not on file.   Social History Main Topics  . Smoking status: Never Smoker   . Smokeless tobacco: Not on file  . Alcohol Use: No  . Drug Use: No  . Sexual Activity: Not on file   Other Topics Concern  . Not on file   Social History Narrative  . No narrative on file   Family History  Problem Relation Age of Onset  . Cancer Mother     ovarian   Current Outpatient Prescriptions on File Prior to Visit  Medication Sig Dispense Refill  . albuterol (PROVENTIL HFA;VENTOLIN HFA) 108 (90 BASE) MCG/ACT inhaler Inhale 2 puffs into the lungs every 6 (six) hours as needed for wheezing or shortness of breath.  1 Inhaler  1  . aspirin 325 MG tablet Take 325 mg by mouth daily.      . B Complex Vitamins (VITAMIN B-COMPLEX PO) Take 1 tablet by mouth daily.      . Calcium Citrate-Vitamin D (CVS CALCIUM CITRATE +D PO) Take 2 tablets by mouth every evening.       . cloNIDine (CATAPRES) 0.1 MG tablet Take 1 tablet (0.1 mg total) by mouth 2 (two) times daily.  60 tablet  2  . cycloSPORINE (RESTASIS) 0.05 % ophthalmic  emulsion Place 1 drop into both eyes 2 (two) times daily.       . Ergocalciferol (VITAMIN D2) 2000 UNITS TABS Take 2 tablets by mouth daily.      . furosemide (LASIX) 40 MG tablet Take 1 tablet (40 mg total) by mouth daily.  30 tablet  5  . Lancets (ONETOUCH ULTRASOFT) lancets 1 each by Other route daily.  100 each  6  . levothyroxine (SYNTHROID, LEVOTHROID) 100 MCG tablet Take 1 tablet (100 mcg total) by mouth daily.  30 tablet  3  . losartan (COZAAR) 100 MG tablet TAKE 1 TABLET (100 MG TOTAL) BY MOUTH DAILY.  30 tablet  5  . Magnesium 250 MG TABS Take 1 tablet by mouth every evening.       . metFORMIN (GLUCOPHAGE) 500 MG tablet Take 1 tablet (500 mg total) by mouth 2 (two) times daily with a meal.  60 tablet  3  . metoprolol (LOPRESSOR) 50 MG tablet TAKE 1 TABLET (50 MG TOTAL) BY MOUTH 2 (TWO) TIMES DAILY.  60 tablet  2  . simvastatin (ZOCOR) 40 MG tablet Take 1 tablet (40 mg total) by  mouth at bedtime.  30 tablet  1  . vitamin C (ASCORBIC ACID) 500 MG tablet Take 500 mg by mouth daily.      Marland Kitchen glucose blood test strip 1 each by Other route daily.  50 each  12   No current facility-administered medications on file prior to visit.   Allergies  Allergen Reactions  . Ace Inhibitors   . Neosporin [Neomycin-Bacitracin Zn-Polymyx]   . Norvasc [Amlodipine]   . Sulfa Antibiotics    Immunization History  Administered Date(s) Administered  . Influenza Whole 08/02/2011  . Influenza,inj,Quad PF,36+ Mos 05/04/2013   Prior to Admission medications   Medication Sig Start Date End Date Taking? Authorizing Provider  albuterol (PROVENTIL HFA;VENTOLIN HFA) 108 (90 BASE) MCG/ACT inhaler Inhale 2 puffs into the lungs every 6 (six) hours as needed for wheezing or shortness of breath. 06/19/13  Yes Mae Loree Fee, FNP  aspirin 325 MG tablet Take 325 mg by mouth daily.   Yes Historical Provider, MD  B Complex Vitamins (VITAMIN B-COMPLEX PO) Take 1 tablet by mouth daily.   Yes Historical Provider, MD   Calcium Citrate-Vitamin D (CVS CALCIUM CITRATE +D PO) Take 2 tablets by mouth every evening.    Yes Historical Provider, MD  cloNIDine (CATAPRES) 0.1 MG tablet Take 1 tablet (0.1 mg total) by mouth 2 (two) times daily. 08/19/13  Yes Mae Loree Fee, FNP  cycloSPORINE (RESTASIS) 0.05 % ophthalmic emulsion Place 1 drop into both eyes 2 (two) times daily.    Yes Historical Provider, MD  Ergocalciferol (VITAMIN D2) 2000 UNITS TABS Take 2 tablets by mouth daily.   Yes Historical Provider, MD  furosemide (LASIX) 40 MG tablet Take 1 tablet (40 mg total) by mouth daily. 07/05/13  Yes Mae Loree Fee, FNP  Lancets [facility] ULTRASOFT) lancets 1 each by Other route daily. 10/05/13  Yes Vernie Shanks, MD  levothyroxine (SYNTHROID, LEVOTHROID) 100 MCG tablet Take 1 tablet (100 mcg total) by mouth daily. 10/01/13  Yes Vernie Shanks, MD  losartan (COZAAR) 100 MG tablet TAKE 1 TABLET (100 MG TOTAL) BY MOUTH DAILY. 09/30/13  Yes Vernie Shanks, MD  Magnesium 250 MG TABS Take 1 tablet by mouth every evening.    Yes Historical Provider, MD  metFORMIN (GLUCOPHAGE) 500 MG tablet Take 1 tablet (500 mg total) by mouth 2 (two) times daily with a meal. 06/16/13  Yes Mae Loree Fee, FNP  metoprolol (LOPRESSOR) 50 MG tablet TAKE 1 TABLET (50 MG TOTAL) BY MOUTH 2 (TWO) TIMES DAILY. 09/17/13  Yes Mae Loree Fee, FNP  simvastatin (ZOCOR) 40 MG tablet Take 1 tablet (40 mg total) by mouth at bedtime. 09/27/13  Yes Vernie Shanks, MD  vitamin C (ASCORBIC ACID) 500 MG tablet Take 500 mg by mouth daily.   Yes Historical Provider, MD  glucose blood test strip 1 each by Other route daily. 10/05/13   Vernie Shanks, MD     ROS: As above in the HPI. All other systems are stable or negative.  OBJECTIVE: APPEARANCE:  Patient in no acute distress.The patient appeared well nourished and normally developed. Acyanotic. Waist: VITAL SIGNS:BP 109/70  Pulse 65  Temp(Src) 98.9 F (37.2 C) (Oral)  Ht 5\' 1"  (1.549 m)  Wt 194 lb (87.998  kg)  BMI 36.67 kg/m2 WF obese Short stature. SKIN: warm and  Dry without overt rashes, tattoos and scars. Venous stasis discoloration of the leg.  HEAD and Neck: without JVD, Head and scalp: normal Eyes:No scleral icterus. Fundi normal,  eye movements normal. Ears: Auricle normal, canal normal, Tympanic membranes normal, insufflation normal. Nose: normal Throat: normal Neck & thyroid: normal  CHEST & LUNGS: Chest wall: normal Lungs: Clear  CVS: Reveals the PMI to be normally located. Regular rhythm, First and Second Heart sounds are normal,  absence of murmurs, rubs or gallops. Peripheral vasculature: Radial pulses: normal Dorsal pedis pulses: normal Posterior pulses: normal  ABDOMEN:  Appearance: Obese Benign, no organomegaly, no masses, no Abdominal Aortic enlargement. No Guarding , no rebound. No Bruits. Bowel sounds: normal  RECTAL: N/A GU: N/A  EXTREMETIES: trace edema of the legs.  MUSCULOSKELETAL:  Spine: normal Joints: intact  NEUROLOGIC: oriented to time,place and person; nonfocal. Strength is normal Sensory is normal Reflexes are normal Cranial Nerves are normal.  ASSESSMENT: Thrombophlebitis leg  HTN (hypertension)  Other and unspecified hyperlipidemia  Diabetes  Vitamin D deficiency Improved. She has adjusted the thyroid medication dosages.  PLAN:  Handout on allergic rhinitis and phlebitis in the AVS discussed Wear her compression stockings every day and change in 6 months. Continue with her medications. She will need labs recheck in  6 to 8 weeks.  Plant based  Diet discussed.  No orders of the defined types were placed in this encounter.   No orders of the defined types were placed in this encounter.   Medications Discontinued During This Encounter  Medication Reason  . doxycycline (VIBRAMYCIN) 100 MG capsule Completed Course   Return in about 6 weeks (around 12/09/2013) for Recheck medical problems.  Oral Remache P. Jacelyn Grip,  M.D.

## 2013-11-17 ENCOUNTER — Other Ambulatory Visit: Payer: Self-pay | Admitting: *Deleted

## 2013-11-17 DIAGNOSIS — E119 Type 2 diabetes mellitus without complications: Secondary | ICD-10-CM

## 2013-11-17 MED ORDER — METFORMIN HCL 500 MG PO TABS: 500.0000 mg | ORAL_TABLET | Freq: Two times a day (BID) | ORAL | Status: DC

## 2013-11-17 MED ORDER — CLONIDINE HCL 0.1 MG PO TABS: 0.1000 mg | ORAL_TABLET | Freq: Two times a day (BID) | ORAL | Status: DC

## 2013-11-26 ENCOUNTER — Other Ambulatory Visit: Payer: Self-pay | Admitting: *Deleted

## 2013-11-26 DIAGNOSIS — E785 Hyperlipidemia, unspecified: Secondary | ICD-10-CM

## 2013-11-26 MED ORDER — SIMVASTATIN 40 MG PO TABS: 40.0000 mg | ORAL_TABLET | Freq: Every day | ORAL | Status: DC

## 2013-12-01 ENCOUNTER — Other Ambulatory Visit (INDEPENDENT_AMBULATORY_CARE_PROVIDER_SITE_OTHER): Payer: Medicare Other

## 2013-12-01 DIAGNOSIS — E039 Hypothyroidism, unspecified: Secondary | ICD-10-CM

## 2013-12-02 LAB — THYROID PANEL WITH TSH
Free Thyroxine Index: 1.7 (ref 1.2–4.9)
T3 Uptake Ratio: 28 % (ref 24–39)
T4, Total: 6.1 ug/dL (ref 4.5–12.0)
TSH: 3.64 u[IU]/mL (ref 0.450–4.500)

## 2013-12-13 ENCOUNTER — Other Ambulatory Visit: Payer: Self-pay | Admitting: General Practice

## 2013-12-25 ENCOUNTER — Other Ambulatory Visit: Payer: Self-pay | Admitting: Nurse Practitioner

## 2014-01-17 DIAGNOSIS — H2589 Other age-related cataract: Secondary | ICD-10-CM | POA: Diagnosis not present

## 2014-01-17 DIAGNOSIS — H52229 Regular astigmatism, unspecified eye: Secondary | ICD-10-CM | POA: Diagnosis not present

## 2014-01-17 DIAGNOSIS — H5200 Hypermetropia, unspecified eye: Secondary | ICD-10-CM | POA: Diagnosis not present

## 2014-01-17 DIAGNOSIS — E119 Type 2 diabetes mellitus without complications: Secondary | ICD-10-CM | POA: Diagnosis not present

## 2014-01-19 ENCOUNTER — Other Ambulatory Visit: Payer: Self-pay | Admitting: Nurse Practitioner

## 2014-01-20 ENCOUNTER — Other Ambulatory Visit: Payer: Self-pay | Admitting: *Deleted

## 2014-01-20 DIAGNOSIS — E039 Hypothyroidism, unspecified: Secondary | ICD-10-CM

## 2014-01-20 MED ORDER — LEVOTHYROXINE SODIUM 100 MCG PO TABS: 100.0000 ug | ORAL_TABLET | Freq: Every day | ORAL | Status: DC

## 2014-02-19 ENCOUNTER — Other Ambulatory Visit: Payer: Self-pay | Admitting: Nurse Practitioner

## 2014-02-28 ENCOUNTER — Encounter (HOSPITAL_COMMUNITY): Payer: Self-pay | Admitting: Pharmacy Technician

## 2014-02-28 DIAGNOSIS — E119 Type 2 diabetes mellitus without complications: Secondary | ICD-10-CM | POA: Diagnosis not present

## 2014-02-28 DIAGNOSIS — H04129 Dry eye syndrome of unspecified lacrimal gland: Secondary | ICD-10-CM | POA: Diagnosis not present

## 2014-02-28 DIAGNOSIS — H2589 Other age-related cataract: Secondary | ICD-10-CM | POA: Diagnosis not present

## 2014-03-04 NOTE — Patient Instructions (Addendum)
Alexandria Irwin  03/04/2014   Your procedure is scheduled on:  03/14/14  Report to [facility] at 10:00 AM.  Call this number if you have problems the morning of surgery: [phone]   Remember:   Do not eat food or drink liquids after midnight.   Take these medicines the morning of surgery with A SIP OF WATER: Catapress, Restasis, Synthroid, Claritin  Cozaar, Metoprolol  DO NOT TAKE METFORMIN   Do not wear jewelry, make-up or nail polish.  Do not wear lotions, powders, or perfumes. You may wear deodorant.  Do not shave 48 hours prior to surgery. Men may shave face and neck.  Do not bring valuables to the hospital.  [facility] is not responsible for any belongings or valuables.               Contacts, dentures or bridgework may not be worn into surgery.  Leave suitcase in the car. After surgery it may be brought to your room.  For patients admitted to the hospital, discharge time is determined by your treatment team.               Patients discharged the day of surgery will not be allowed to drive home.  Name and phone number of your driver:   Special Instructions: N/A   Please read over the following fact sheets that you were given: Anesthesia Post-op Instructions   PATIENT INSTRUCTIONS POST-ANESTHESIA  IMMEDIATELY FOLLOWING SURGERY:  Do not drive or operate machinery for the first twenty four hours after surgery.  Do not make any important decisions for twenty four hours after surgery or while taking narcotic pain medications or sedatives.  If you develop intractable nausea and vomiting or a severe headache please notify your doctor immediately.  FOLLOW-UP:  Please make an appointment with your surgeon as instructed. You do not need to follow up with anesthesia unless specifically instructed to do so.  WOUND CARE INSTRUCTIONS (if applicable):  Keep a dry clean dressing on the anesthesia/puncture wound site if there is drainage.  Once the wound has quit draining you may leave it open  to air.  Generally you should leave the bandage intact for twenty four hours unless there is drainage.  If the epidural site drains for more than 36-48 hours please call the anesthesia department.  QUESTIONS?:  Please feel free to call your physician or the hospital operator if you have any questions, and they will be happy to assist you.      Cataract A cataract is a clouding of the lens of the eye. When a lens becomes cloudy, vision is reduced based on the degree and nature of the clouding. Many cataracts reduce vision to some degree. Some cataracts make people more near-sighted as they develop. Other cataracts increase glare. Cataracts that are ignored and become worse can sometimes look white. The white color can be seen through the pupil. CAUSES   Aging. However, cataracts may occur at any age, even in newborns.  Certain drugs.  Trauma to the eye.  Certain diseases such as diabetes.  Specific eye diseases such as chronic inflammation inside the eye or a sudden attack of a rare form of glaucoma.  Inherited or acquired medical problems. SYMPTOMS   Gradual, progressive drop in vision in the affected eye.  Severe, rapid visual loss. This most often happens when trauma is the cause. DIAGNOSIS  To detect a cataract, an eye doctor examines the lens. Cataracts are best diagnosed with an exam of the  eyes with the pupils enlarged (dilated) by drops.  TREATMENT  For an early cataract, vision may improve by using different eyeglasses or stronger lighting. If that does not help your vision, surgery is the only effective treatment. A cataract needs to be surgically removed when vision loss interferes with your everyday activities, such as driving, reading, or watching TV. A cataract may also have to be removed if it prevents examination or treatment of another eye problem. Surgery removes the cloudy lens and usually replaces it with a substitute lens (intraocular lens, IOL).  At a time when both  you and your doctor agree, the cataract will be surgically removed. If you have cataracts in both eyes, only one is usually removed at a time. This allows the operated eye to heal and be out of danger from any possible problems after surgery (such as infection or poor wound healing). In rare cases, a cataract may be doing damage to your eye. In these cases, your caregiver may advise surgical removal right away. The vast majority of people who have cataract surgery have better vision afterward. HOME CARE INSTRUCTIONS  If you are not planning surgery, you may be asked to do the following:  Use different eyeglasses.  Use stronger or brighter lighting.  Ask your eye doctor about reducing your medicine dose or changing medicines if it is thought that a medicine caused your cataract. Changing medicines does not make the cataract go away on its own.  Become familiar with your surroundings. Poor vision can lead to injury. Avoid bumping into things on the affected side. You are at a higher risk for tripping or falling.  Exercise extreme care when driving or operating machinery.  Wear sunglasses if you are sensitive to bright light or experiencing problems with glare. SEEK IMMEDIATE MEDICAL CARE IF:   You have a worsening or sudden vision loss.  You notice redness, swelling, or increasing pain in the eye.  You have a fever. Document Released: 06/17/2005 Document Revised: 09/09/2011 Document Reviewed: 02/08/2011 [facility] Patient Information 2015 [place], [place]. This information is not intended to replace advice given to you by your health care provider. Make sure you discuss any questions you have with your health care provider.

## 2014-03-08 ENCOUNTER — Encounter (HOSPITAL_COMMUNITY)
Admission: RE | Admit: 2014-03-08 | Discharge: 2014-03-08 | Disposition: A | Discharge status: Home / Self Care | Payer: Medicare Other | Source: Ambulatory Visit | Attending: Ophthalmology | Admitting: Ophthalmology

## 2014-03-08 ENCOUNTER — Encounter (HOSPITAL_COMMUNITY): Payer: Self-pay

## 2014-03-08 DIAGNOSIS — H2589 Other age-related cataract: Secondary | ICD-10-CM | POA: Insufficient documentation

## 2014-03-08 DIAGNOSIS — Z01818 Encounter for other preprocedural examination: Secondary | ICD-10-CM | POA: Diagnosis not present

## 2014-03-08 HISTORY — DX: Hypothyroidism, unspecified: E03.9

## 2014-03-08 LAB — BASIC METABOLIC PANEL
ANION GAP: 10 (ref 5–15)
BUN: 14 mg/dL (ref 6–23)
CALCIUM: 10.1 mg/dL (ref 8.4–10.5)
CHLORIDE: 101 meq/L (ref 96–112)
CO2: 33 meq/L — AB (ref 19–32)
CREATININE: 0.74 mg/dL (ref 0.50–1.10)
GFR calc non Af Amer: 84 mL/min — ABNORMAL LOW (ref >90)
Glucose, Bld: 258 mg/dL — ABNORMAL HIGH (ref 70–99)
Potassium: 4.6 mEq/L (ref 3.7–5.3)
SODIUM: 144 meq/L (ref 137–147)

## 2014-03-08 LAB — HEMOGLOBIN AND HEMATOCRIT, BLOOD
HCT: 39.5 % (ref 36.0–46.0)
HEMOGLOBIN: 14 g/dL (ref 12.0–15.0)

## 2014-03-11 MED ORDER — CYCLOPENTOLATE-PHENYLEPHRINE OP SOLN OPTIME - NO CHARGE
OPHTHALMIC | Status: AC
  Filled 2014-03-11: qty 2

## 2014-03-11 MED ORDER — LIDOCAINE HCL 3.5 % OP GEL
OPHTHALMIC | Status: AC
  Filled 2014-03-11: qty 1

## 2014-03-11 MED ORDER — NEOMYCIN-POLYMYXIN-DEXAMETH 3.5-10000-0.1 OP SUSP
OPHTHALMIC | Status: AC
  Filled 2014-03-11: qty 5

## 2014-03-11 MED ORDER — TETRACAINE HCL 0.5 % OP SOLN
OPHTHALMIC | Status: AC
  Filled 2014-03-11: qty 2

## 2014-03-11 MED ORDER — PHENYLEPHRINE HCL 2.5 % OP SOLN
OPHTHALMIC | Status: AC
  Filled 2014-03-11: qty 15

## 2014-03-11 MED ORDER — LIDOCAINE HCL (PF) 1 % IJ SOLN
INTRAMUSCULAR | Status: AC
  Filled 2014-03-11: qty 2

## 2014-03-14 ENCOUNTER — Encounter (HOSPITAL_COMMUNITY): Payer: Self-pay | Admitting: *Deleted

## 2014-03-14 ENCOUNTER — Encounter (HOSPITAL_COMMUNITY)
Admission: RE | Disposition: A | Discharge status: Home / Self Care | Payer: Self-pay | Source: Ambulatory Visit | Attending: Ophthalmology

## 2014-03-14 ENCOUNTER — Ambulatory Visit (HOSPITAL_COMMUNITY): Payer: Medicare Other | Admitting: Anesthesiology

## 2014-03-14 ENCOUNTER — Ambulatory Visit (HOSPITAL_COMMUNITY)
Admission: RE | Admit: 2014-03-14 | Discharge: 2014-03-14 | Disposition: A | Discharge status: Home / Self Care | Payer: Medicare Other | Source: Ambulatory Visit | Attending: Ophthalmology | Admitting: Ophthalmology

## 2014-03-14 ENCOUNTER — Encounter (HOSPITAL_COMMUNITY): Payer: Medicare Other | Admitting: Anesthesiology

## 2014-03-14 DIAGNOSIS — Z7982 Long term (current) use of aspirin: Secondary | ICD-10-CM | POA: Insufficient documentation

## 2014-03-14 DIAGNOSIS — I10 Essential (primary) hypertension: Secondary | ICD-10-CM | POA: Diagnosis not present

## 2014-03-14 DIAGNOSIS — Z79899 Other long term (current) drug therapy: Secondary | ICD-10-CM | POA: Insufficient documentation

## 2014-03-14 DIAGNOSIS — E119 Type 2 diabetes mellitus without complications: Secondary | ICD-10-CM | POA: Diagnosis not present

## 2014-03-14 DIAGNOSIS — H2589 Other age-related cataract: Secondary | ICD-10-CM | POA: Diagnosis not present

## 2014-03-14 DIAGNOSIS — E079 Disorder of thyroid, unspecified: Secondary | ICD-10-CM | POA: Diagnosis not present

## 2014-03-14 DIAGNOSIS — H259 Unspecified age-related cataract: Secondary | ICD-10-CM | POA: Diagnosis not present

## 2014-03-14 HISTORY — PX: CATARACT EXTRACTION W/PHACO: SHX586

## 2014-03-14 LAB — GLUCOSE, CAPILLARY: GLUCOSE-CAPILLARY: 172 mg/dL — AB (ref 70–99)

## 2014-03-14 SURGERY — PHACOEMULSIFICATION, CATARACT, WITH IOL INSERTION
Anesthesia: Monitor Anesthesia Care | Site: Eye | Laterality: Right

## 2014-03-14 MED ORDER — CYCLOPENTOLATE-PHENYLEPHRINE 0.2-1 % OP SOLN
1.0000 [drp] | OPHTHALMIC | Status: AC
  Administered 2014-03-14 (×3): 1 [drp] via OPHTHALMIC

## 2014-03-14 MED ORDER — BSS IO SOLN
INTRAOCULAR | Status: DC | PRN
  Administered 2014-03-14: 15 mL via INTRAOCULAR

## 2014-03-14 MED ORDER — MIDAZOLAM HCL 2 MG/2ML IJ SOLN
1.0000 mg | INTRAMUSCULAR | Status: DC | PRN
  Administered 2014-03-14: 2 mg via INTRAVENOUS
  Filled 2014-03-14: qty 2

## 2014-03-14 MED ORDER — LIDOCAINE 3.5 % OP GEL OPTIME - NO CHARGE
OPHTHALMIC | Status: DC | PRN
  Administered 2014-03-14: 2 [drp] via OPHTHALMIC

## 2014-03-14 MED ORDER — TETRACAINE HCL 0.5 % OP SOLN
1.0000 [drp] | OPHTHALMIC | Status: AC
  Administered 2014-03-14 (×3): 1 [drp] via OPHTHALMIC

## 2014-03-14 MED ORDER — LIDOCAINE HCL (PF) 1 % IJ SOLN
INTRAMUSCULAR | Status: DC | PRN
  Administered 2014-03-14: .4 mL

## 2014-03-14 MED ORDER — POVIDONE-IODINE 5 % OP SOLN
OPHTHALMIC | Status: DC | PRN
  Administered 2014-03-14: 1 via OPHTHALMIC

## 2014-03-14 MED ORDER — PROVISC 10 MG/ML IO SOLN
INTRAOCULAR | Status: DC | PRN
  Administered 2014-03-14: 0.85 mL via INTRAOCULAR

## 2014-03-14 MED ORDER — FENTANYL CITRATE 0.05 MG/ML IJ SOLN
25.0000 ug | INTRAMUSCULAR | Status: AC
  Administered 2014-03-14 (×2): 25 ug via INTRAVENOUS
  Filled 2014-03-14: qty 2

## 2014-03-14 MED ORDER — PHENYLEPHRINE HCL 2.5 % OP SOLN
1.0000 [drp] | OPHTHALMIC | Status: AC
  Administered 2014-03-14 (×3): 1 [drp] via OPHTHALMIC

## 2014-03-14 MED ORDER — LIDOCAINE HCL 3.5 % OP GEL
1.0000 "application " | Freq: Once | OPHTHALMIC | Status: AC
  Administered 2014-03-14: 1 via OPHTHALMIC

## 2014-03-14 MED ORDER — LACTATED RINGERS IV SOLN
INTRAVENOUS | Status: DC
  Administered 2014-03-14: 11:00:00 via INTRAVENOUS

## 2014-03-14 MED ORDER — EPINEPHRINE HCL 1 MG/ML IJ SOLN
INTRAOCULAR | Status: DC | PRN
  Administered 2014-03-14: 11:00:00

## 2014-03-14 MED ORDER — EPINEPHRINE HCL 1 MG/ML IJ SOLN
INTRAMUSCULAR | Status: AC
  Filled 2014-03-14: qty 1

## 2014-03-14 SURGICAL SUPPLY — 35 items
CAPSULAR TENSION RING-AMO (OPHTHALMIC RELATED) IMPLANT
CLOTH BEACON ORANGE TIMEOUT ST (SAFETY) ×3 IMPLANT
EYE SHIELD UNIVERSAL CLEAR (GAUZE/BANDAGES/DRESSINGS) ×3 IMPLANT
GLOVE BIO SURGEON STRL SZ 6.5 (GLOVE) IMPLANT
GLOVE BIO SURGEONS STRL SZ 6.5 (GLOVE)
GLOVE BIOGEL PI IND STRL 6.5 (GLOVE) ×1 IMPLANT
GLOVE BIOGEL PI IND STRL 7.0 (GLOVE) IMPLANT
GLOVE BIOGEL PI IND STRL 7.5 (GLOVE) IMPLANT
GLOVE BIOGEL PI INDICATOR 6.5 (GLOVE) ×2
GLOVE BIOGEL PI INDICATOR 7.0 (GLOVE)
GLOVE BIOGEL PI INDICATOR 7.5 (GLOVE)
GLOVE ECLIPSE 6.5 STRL STRAW (GLOVE) IMPLANT
GLOVE ECLIPSE 7.0 STRL STRAW (GLOVE) IMPLANT
GLOVE ECLIPSE 7.5 STRL STRAW (GLOVE) IMPLANT
GLOVE EXAM NITRILE LRG STRL (GLOVE) IMPLANT
GLOVE EXAM NITRILE MD LF STRL (GLOVE) ×3 IMPLANT
GLOVE SKINSENSE NS SZ6.5 (GLOVE)
GLOVE SKINSENSE NS SZ7.0 (GLOVE)
GLOVE SKINSENSE STRL SZ6.5 (GLOVE) IMPLANT
GLOVE SKINSENSE STRL SZ7.0 (GLOVE) IMPLANT
GLOVE SURG SS PI 7.5 STRL IVOR (GLOVE) ×3 IMPLANT
KIT VITRECTOMY (OPHTHALMIC RELATED) IMPLANT
PAD ARMBOARD 7.5X6 YLW CONV (MISCELLANEOUS) ×3 IMPLANT
PROC W NO LENS (INTRAOCULAR LENS)
PROC W SPEC LENS (INTRAOCULAR LENS)
PROCESS W NO LENS (INTRAOCULAR LENS) IMPLANT
PROCESS W SPEC LENS (INTRAOCULAR LENS) IMPLANT
RETRACTOR IRIS SIGHTPATH (OPHTHALMIC RELATED) IMPLANT
RING MALYGIN (MISCELLANEOUS) IMPLANT
SIGHTPATH CAT PROC W REG LENS (Ophthalmic Related) ×3 IMPLANT
SYRINGE LUER LOK 1CC (MISCELLANEOUS) ×3 IMPLANT
TAPE SURG TRANSPORE 1 IN (GAUZE/BANDAGES/DRESSINGS) ×1 IMPLANT
TAPE SURGICAL TRANSPORE 1 IN (GAUZE/BANDAGES/DRESSINGS) ×2
VISCOELASTIC ADDITIONAL (OPHTHALMIC RELATED) IMPLANT
WATER STERILE IRR 250ML POUR (IV SOLUTION) ×3 IMPLANT

## 2014-03-14 NOTE — H&P (Signed)
I have reviewed the H&P, the patient was re-examined, and I have identified no interval changes in medical condition and plan of care since the history and physical of record  

## 2014-03-14 NOTE — Transfer of Care (Signed)
Immediate Anesthesia Transfer of Care Note  Patient: Alexandria Irwin  Procedure(s) Performed: Procedure(s) with comments: CATARACT EXTRACTION PHACO AND INTRAOCULAR LENS PLACEMENT (IOC) (Right) - CDE: 8.34  Patient Location: Short Stay  Anesthesia Type:MAC  Level of Consciousness: awake, alert , oriented and patient cooperative  Airway & Oxygen Therapy: Patient Spontanous Breathing  Post-op Assessment: Report given to PACU RN, Post -op Vital signs reviewed and stable and Patient moving all extremities  Post vital signs: Reviewed and stable  Complications: No apparent anesthesia complications

## 2014-03-14 NOTE — Anesthesia Preprocedure Evaluation (Signed)
Anesthesia Evaluation  Patient identified by MRN, date of birth, ID band Patient awake    Reviewed: Allergy & Precautions, H&P , NPO status , Patient's Chart, lab work & pertinent test results, reviewed documented beta blocker date and time   Airway Mallampati: II TM Distance: >3 FB     Dental  (+) Teeth Intact   Pulmonary neg pulmonary ROS,  breath sounds clear to auscultation        Cardiovascular hypertension, Pt. on home beta blockers and Pt. on medications + Peripheral Vascular Disease Rhythm:Regular Rate:Normal     Neuro/Psych    GI/Hepatic negative GI ROS,   Endo/Other  diabetes, Well Controlled, Type 2, Oral Hypoglycemic AgentsHypothyroidism   Renal/GU      Musculoskeletal   Abdominal   Peds  Hematology   Anesthesia Other Findings   Reproductive/Obstetrics                           Anesthesia Physical Anesthesia Plan  ASA: III  Anesthesia Plan: MAC   Post-op Pain Management:    Induction: Intravenous  Airway Management Planned: Nasal Cannula  Additional Equipment:   Intra-op Plan:   Post-operative Plan:   Informed Consent: I have reviewed the patients History and Physical, chart, labs and discussed the procedure including the risks, benefits and alternatives for the proposed anesthesia with the patient or authorized representative who has indicated his/her understanding and acceptance.     Plan Discussed with:   Anesthesia Plan Comments:         Anesthesia Quick Evaluation

## 2014-03-14 NOTE — Discharge Instructions (Signed)

## 2014-03-14 NOTE — Op Note (Signed)
Date of Admission: 03/14/2014  Date of Surgery: 03/14/2014   Pre-Op Dx: Cataract Right Eye  Post-Op Dx: Senile Combined  Cataract Right  Eye,  Dx Code 366.19  Surgeon: Tonny Branch, M.D.  Assistants: None  Anesthesia: Topical with MAC  Indications: Painless, progressive loss of vision with compromise of daily activities.  Surgery: Cataract Extraction with Intraocular lens Implant Right Eye  Discription: The patient had dilating drops and viscous lidocaine placed into the Right eye in the pre-op holding area. After transfer to the operating room, a time out was performed. The patient was then prepped and draped. Beginning with a 71 degree blade a paracentesis port was made at the surgeon's 2 o'clock position. The anterior chamber was then filled with 1% non-preserved lidocaine. This was followed by filling the anterior chamber with Provisc.  A 2.67mm keratome blade was used to make a clear corneal incision at the temporal limbus.  A bent cystatome needle was used to create a continuous tear capsulotomy. Hydrodissection was performed with balanced salt solution on a Fine canula. The lens nucleus was then removed using the phacoemulsification handpiece. Residual cortex was removed with the I&A handpiece. The anterior chamber and capsular bag were refilled with Provisc. A posterior chamber intraocular lens was placed into the capsular bag with it's injector. The implant was positioned with the Kuglan hook. The Provisc was then removed from the anterior chamber and capsular bag with the I&A handpiece. Stromal hydration of the main incision and paracentesis port was performed with BSS on a Fine canula. The wounds were tested for leak which was negative. The patient tolerated the procedure well. There were no operative complications. The patient was then transferred to the recovery room in stable condition.  Complications: None  Specimen: None  EBL: None  Prosthetic device: Hoya iSert 250, power 19.0  D, SN [id number].

## 2014-03-14 NOTE — Anesthesia Postprocedure Evaluation (Signed)
  Anesthesia Post-op Note  Patient: Alexandria Irwin  Procedure(s) Performed: Procedure(s) with comments: CATARACT EXTRACTION PHACO AND INTRAOCULAR LENS PLACEMENT (IOC) (Right) - CDE: 8.34  Patient Location: Short Stay  Anesthesia Type:MAC  Level of Consciousness: awake, alert , oriented and patient cooperative  Airway and Oxygen Therapy: Patient Spontanous Breathing  Post-op Pain: none  Post-op Assessment: Post-op Vital signs reviewed, Patient's Cardiovascular Status Stable, Respiratory Function Stable, Patent Airway and Pain level controlled  Post-op Vital Signs: Reviewed and stable  Last Vitals:  Filed Vitals:   03/14/14 1100  BP: 123/79  Pulse:   Temp:   Resp: 20    Complications: No apparent anesthesia complications

## 2014-03-15 ENCOUNTER — Encounter (HOSPITAL_COMMUNITY): Payer: Self-pay | Admitting: Ophthalmology

## 2014-04-01 ENCOUNTER — Other Ambulatory Visit: Payer: Self-pay | Admitting: *Deleted

## 2014-04-01 MED ORDER — LOSARTAN POTASSIUM 100 MG PO TABS: 100.0000 mg | ORAL_TABLET | Freq: Every day | ORAL | Status: DC

## 2014-04-18 ENCOUNTER — Other Ambulatory Visit: Payer: Self-pay | Admitting: Family Medicine

## 2014-04-18 ENCOUNTER — Other Ambulatory Visit: Payer: Self-pay | Admitting: Nurse Practitioner

## 2014-04-19 NOTE — Telephone Encounter (Signed)
Last seen 10/28/13 FPW

## 2014-04-19 NOTE — Telephone Encounter (Signed)
Last seen 10/28/13   FPW Last glucose 09/30/13

## 2014-04-19 NOTE — Telephone Encounter (Signed)
This is okay to refill x1 for both of these prescriptions. She should be given an appointment to be seen by a provider said that lab work and physical condition can be reestablished

## 2014-04-19 NOTE — Telephone Encounter (Signed)
This patient needs to be seen by a provider, please give her an appointment. The medication can be refilled until she is seen by a provider here

## 2014-04-23 ENCOUNTER — Ambulatory Visit (INDEPENDENT_AMBULATORY_CARE_PROVIDER_SITE_OTHER): Payer: Medicare Other

## 2014-04-23 DIAGNOSIS — Z23 Encounter for immunization: Secondary | ICD-10-CM

## 2014-04-29 ENCOUNTER — Other Ambulatory Visit: Payer: Self-pay | Admitting: Nurse Practitioner

## 2014-05-09 ENCOUNTER — Other Ambulatory Visit: Payer: Self-pay | Admitting: Nurse Practitioner

## 2014-05-10 ENCOUNTER — Telehealth: Payer: Self-pay | Admitting: Nurse Practitioner

## 2014-05-10 ENCOUNTER — Telehealth: Payer: Self-pay | Admitting: *Deleted

## 2014-05-10 NOTE — Telephone Encounter (Signed)
Pt notified Rx was sent into pharmacy Appt scheduled

## 2014-05-10 NOTE — Telephone Encounter (Signed)
Aware,ntbs for chronic health recheck and labwork.

## 2014-05-17 ENCOUNTER — Other Ambulatory Visit: Payer: Self-pay | Admitting: Family Medicine

## 2014-05-29 ENCOUNTER — Other Ambulatory Visit: Payer: Self-pay | Admitting: Nurse Practitioner

## 2014-06-06 ENCOUNTER — Other Ambulatory Visit: Payer: Self-pay | Admitting: Family Medicine

## 2014-06-20 ENCOUNTER — Telehealth: Payer: Self-pay | Admitting: Family Medicine

## 2014-06-20 MED ORDER — METOPROLOL SUCCINATE ER 50 MG PO TB24: 50.0000 mg | ORAL_TABLET | Freq: Two times a day (BID) | ORAL | Status: DC

## 2014-06-20 NOTE — Telephone Encounter (Signed)
Aware, script for bp refilled.  Appt in January.

## 2014-06-23 ENCOUNTER — Other Ambulatory Visit: Payer: Self-pay | Admitting: Nurse Practitioner

## 2014-07-14 ENCOUNTER — Other Ambulatory Visit: Payer: Self-pay | Admitting: Family Medicine

## 2014-07-20 ENCOUNTER — Other Ambulatory Visit: Payer: Self-pay | Admitting: Nurse Practitioner

## 2014-07-21 ENCOUNTER — Ambulatory Visit (INDEPENDENT_AMBULATORY_CARE_PROVIDER_SITE_OTHER): Payer: Medicare Other | Admitting: Family Medicine

## 2014-07-21 ENCOUNTER — Encounter: Payer: Self-pay | Admitting: Family Medicine

## 2014-07-21 VITALS — BP 126/81 | HR 64 | Temp 96.7°F | Ht 62.0 in | Wt 191.0 lb

## 2014-07-21 DIAGNOSIS — E119 Type 2 diabetes mellitus without complications: Secondary | ICD-10-CM

## 2014-07-21 DIAGNOSIS — I10 Essential (primary) hypertension: Secondary | ICD-10-CM | POA: Diagnosis not present

## 2014-07-21 DIAGNOSIS — E039 Hypothyroidism, unspecified: Secondary | ICD-10-CM

## 2014-07-21 LAB — POCT CBC
GRANULOCYTE PERCENT: 59.7 % (ref 37–80)
HCT, POC: 42.9 % (ref 37.7–47.9)
HEMOGLOBIN: 13.7 g/dL (ref 12.2–16.2)
Lymph, poc: 2.3 (ref 0.6–3.4)
MCH: 29.8 pg (ref 27–31.2)
MCHC: 32 g/dL (ref 31.8–35.4)
MCV: 92.9 fL (ref 80–97)
MPV: 7.7 fL (ref 0–99.8)
POC Granulocyte: 3.8 (ref 2–6.9)
POC LYMPH %: 36.4 % (ref 10–50)
Platelet Count, POC: 256 10*3/uL (ref 142–424)
RBC: 4.6 M/uL (ref 4.04–5.48)
RDW, POC: 12.6 %
WBC: 6.3 10*3/uL (ref 4.6–10.2)

## 2014-07-21 LAB — POCT GLYCOSYLATED HEMOGLOBIN (HGB A1C): Hemoglobin A1C: 7.8

## 2014-07-21 MED ORDER — CLONIDINE HCL 0.1 MG PO TABS: 0.1000 mg | ORAL_TABLET | Freq: Two times a day (BID) | ORAL | Status: DC

## 2014-07-21 MED ORDER — METFORMIN HCL 500 MG PO TABS: 500.0000 mg | ORAL_TABLET | Freq: Two times a day (BID) | ORAL | Status: DC

## 2014-07-21 MED ORDER — LEVOTHYROXINE SODIUM 100 MCG PO TABS: 100.0000 ug | ORAL_TABLET | Freq: Every day | ORAL | Status: DC

## 2014-07-21 MED ORDER — FUROSEMIDE 40 MG PO TABS: 40.0000 mg | ORAL_TABLET | Freq: Every day | ORAL | Status: DC

## 2014-07-21 MED ORDER — LOSARTAN POTASSIUM 100 MG PO TABS: 100.0000 mg | ORAL_TABLET | Freq: Every day | ORAL | Status: DC

## 2014-07-21 MED ORDER — SIMVASTATIN 40 MG PO TABS: 40.0000 mg | ORAL_TABLET | Freq: Every day | ORAL | Status: DC

## 2014-07-21 NOTE — Progress Notes (Signed)
Subjective:    Patient ID: Alexandria Irwin, female    DOB: Jun 23, 1946, 69 y.o.   MRN: [id number]  HPI 69 year old female here to follow-up diabetes hypertension hyperlipidemia. She admits to some dietary indiscretion over the holidays. She has gained some weight. When she checks her sugars at home generally been between 150 and 200 and she has been on metformin 500 mg twice a day for quite some time by history. She denies symptoms except for tiredness. She does not sleep well lots of nights. She has responsibility for great-granddaughter most every day and that keeps her busy.  Patient Active Problem List   Diagnosis Date Noted  . Vitamin D deficiency   . Thrombophlebitis leg 09/18/2013  . Dysuria 09/18/2013  . HTN (hypertension) 12/03/2012  . Diabetes 12/03/2012  . Unspecified hypothyroidism 12/03/2012  . Other and unspecified hyperlipidemia 12/03/2012   Outpatient Encounter Prescriptions as of 07/21/2014  Medication Sig  . aspirin 325 MG EC tablet Take 325 mg by mouth daily.  . B Complex Vitamins (VITAMIN B-COMPLEX PO) Take 1 tablet by mouth daily.  . Calcium Citrate-Vitamin D (CVS CALCIUM CITRATE +D PO) Take 2 tablets by mouth every evening.   . cloNIDine (CATAPRES) 0.1 MG tablet TAKE 1 TABLET (0.1 MG TOTAL) BY MOUTH 2 (TWO) TIMES DAILY.  . cycloSPORINE (RESTASIS) 0.05 % ophthalmic emulsion Place 1 drop into both eyes 2 (two) times daily.   . Ergocalciferol (VITAMIN D2) 2000 UNITS TABS Take 5,000 tablets by mouth daily.   . furosemide (LASIX) 40 MG tablet TAKE 1 TABLET (40 MG TOTAL) BY MOUTH DAILY.  Marland Kitchen levothyroxine (SYNTHROID, LEVOTHROID) 100 MCG tablet Take 1 tablet (100 mcg total) by mouth daily.  Marland Kitchen losartan (COZAAR) 100 MG tablet TAKE 1 TABLET (100 MG TOTAL) BY MOUTH DAILY.  . Magnesium 250 MG TABS Take 1 tablet by mouth every evening.   . metFORMIN (GLUCOPHAGE) 500 MG tablet TAKE 1 TABLET (500 MG TOTAL) BY MOUTH 2 (TWO) TIMES DAILY WITH A MEAL.  . metoprolol succinate (TOPROL-XL) 50  MG 24 hr tablet Take 1 tablet (50 mg total) by mouth 2 (two) times daily. Take with or immediately following a meal.  . Omega-3 Fatty Acids (FISH OIL) 1200 MG CAPS Take 1,200 mg by mouth daily.  . simvastatin (ZOCOR) 40 MG tablet TAKE 1 TABLET (40 MG TOTAL) BY MOUTH AT BEDTIME.  . vitamin C (ASCORBIC ACID) 500 MG tablet Take 500 mg by mouth daily.  Marland Kitchen albuterol (PROVENTIL HFA;VENTOLIN HFA) 108 (90 BASE) MCG/ACT inhaler Inhale 2 puffs into the lungs every 6 (six) hours as needed for wheezing or shortness of breath. (Patient not taking: Reported on 07/21/2014)  . loratadine (CLARITIN) 10 MG tablet Take 1 tablet (10 mg total) by mouth daily. (Patient not taking: Reported on 07/21/2014)  . [DISCONTINUED] aspirin 325 MG tablet Take 325 mg by mouth daily.  . [DISCONTINUED] furosemide (LASIX) 40 MG tablet Take 40 mg by mouth daily.     Review of Systems  Eyes: Negative.   Respiratory: Negative.   Cardiovascular: Negative.   Endocrine: Negative.   Genitourinary: Negative.   Musculoskeletal: Negative.        Objective:   Physical Exam  Constitutional: She is oriented to person, place, and time.  Eyes: Pupils are equal, round, and reactive to light.  Neck: Normal range of motion.  Cardiovascular: Normal rate and regular rhythm.   Pulmonary/Chest: Effort normal and breath sounds normal.  Neurological: She is alert and oriented to person, place, and time.  BP 126/81 mmHg  Pulse 64  Temp(Src) 96.7 F (35.9 C) (Oral)  Ht '5\' 2"'  (1.575 m)  Wt 191 lb (86.637 kg)  BMI 34.93 kg/m2        Assessment & Plan:  1. Essential hypertension  - CMP14+EGFR  2. Type 2 diabetes mellitus without complication  - Lipid panel - POCT glycosylated hemoglobin (Hb A1C) - POCT CBC  3. Hypothyroidism, unspecified hypothyroidism type  - levothyroxine (SYNTHROID, LEVOTHROID) 100 MCG tablet; Take 1 tablet (100 mcg total) by mouth daily.  Dispense: 30 tablet; Refill: 3  Wardell Honour MD

## 2014-07-22 LAB — CMP14+EGFR
A/G RATIO: 1.7 (ref 1.1–2.5)
ALT: 31 IU/L (ref 0–32)
AST: 26 IU/L (ref 0–40)
Albumin: 4.3 g/dL (ref 3.5–4.8)
Alkaline Phosphatase: 39 IU/L (ref 39–117)
BUN/Creatinine Ratio: 22 (ref 11–26)
BUN: 19 mg/dL (ref 8–27)
CO2: 27 mmol/L (ref 18–29)
CREATININE: 0.86 mg/dL (ref 0.57–1.00)
Calcium: 9.9 mg/dL (ref 8.7–10.3)
Chloride: 99 mmol/L (ref 97–108)
GFR calc Af Amer: 79 mL/min/{1.73_m2} (ref >59)
GFR calc non Af Amer: 68 mL/min/{1.73_m2} (ref >59)
GLUCOSE: 180 mg/dL — AB (ref 65–99)
Globulin, Total: 2.6 g/dL (ref 1.5–4.5)
Potassium: 5.1 mmol/L (ref 3.5–5.2)
Sodium: 140 mmol/L (ref 134–144)
Total Bilirubin: 0.9 mg/dL (ref 0.0–1.2)
Total Protein: 6.9 g/dL (ref 6.0–8.5)

## 2014-07-22 LAB — LIPID PANEL
CHOL/HDL RATIO: 2.9 ratio (ref 0.0–4.4)
Cholesterol, Total: 115 mg/dL (ref 100–199)
HDL: 39 mg/dL — ABNORMAL LOW (ref >39)
LDL CALC: 31 mg/dL (ref 0–99)
Triglycerides: 223 mg/dL — ABNORMAL HIGH (ref 0–149)
VLDL Cholesterol Cal: 45 mg/dL — ABNORMAL HIGH (ref 5–40)

## 2014-07-24 ENCOUNTER — Other Ambulatory Visit: Payer: Self-pay | Admitting: Family Medicine

## 2014-08-26 ENCOUNTER — Other Ambulatory Visit: Payer: Self-pay | Admitting: Family Medicine

## 2014-08-27 ENCOUNTER — Other Ambulatory Visit: Payer: Self-pay | Admitting: Family Medicine

## 2014-08-29 ENCOUNTER — Ambulatory Visit (INDEPENDENT_AMBULATORY_CARE_PROVIDER_SITE_OTHER): Payer: Medicare Other

## 2014-08-29 ENCOUNTER — Encounter: Payer: Self-pay | Admitting: Family Medicine

## 2014-08-29 ENCOUNTER — Ambulatory Visit (INDEPENDENT_AMBULATORY_CARE_PROVIDER_SITE_OTHER): Payer: Medicare Other | Admitting: Family Medicine

## 2014-08-29 VITALS — BP 130/78 | HR 77 | Temp 98.6°F | Ht 62.0 in | Wt 189.4 lb

## 2014-08-29 DIAGNOSIS — J209 Acute bronchitis, unspecified: Secondary | ICD-10-CM

## 2014-08-29 DIAGNOSIS — J40 Bronchitis, not specified as acute or chronic: Secondary | ICD-10-CM | POA: Diagnosis not present

## 2014-08-29 DIAGNOSIS — J069 Acute upper respiratory infection, unspecified: Secondary | ICD-10-CM

## 2014-08-29 LAB — POCT CBC
Granulocyte percent: 57 %G (ref 37–80)
HCT, POC: 42.9 % (ref 37.7–47.9)
Hemoglobin: 13.6 g/dL (ref 12.2–16.2)
LYMPH, POC: 2.1 (ref 0.6–3.4)
MCH, POC: 29.1 pg (ref 27–31.2)
MCHC: 31.7 g/dL — AB (ref 31.8–35.4)
MCV: 91.8 fL (ref 80–97)
MPV: 7.7 fL (ref 0–99.8)
POC Granulocyte: 3.4 (ref 2–6.9)
POC LYMPH PERCENT: 35.8 %L (ref 10–50)
Platelet Count, POC: 206 10*3/uL (ref 142–424)
RBC: 4.67 M/uL (ref 4.04–5.48)
RDW, POC: 13.2 %
WBC: 5.9 10*3/uL (ref 4.6–10.2)

## 2014-08-29 MED ORDER — BUDESONIDE-FORMOTEROL FUMARATE 80-4.5 MCG/ACT IN AERO: 2.0000 | INHALATION_SPRAY | Freq: Two times a day (BID) | RESPIRATORY_TRACT | Status: DC

## 2014-08-29 MED ORDER — AZITHROMYCIN 250 MG PO TABS: ORAL_TABLET | ORAL | Status: DC

## 2014-08-29 MED ORDER — LEVOFLOXACIN 500 MG PO TABS: 500.0000 mg | ORAL_TABLET | Freq: Every day | ORAL | Status: DC

## 2014-08-29 MED ORDER — PREDNISONE 10 MG PO TABS: ORAL_TABLET | ORAL | Status: DC

## 2014-08-29 NOTE — Patient Instructions (Addendum)
Drink plenty of fluids Take Tylenol for aches pains and fever Use Mucinex maximum strength, blue and white in color, 1 twice daily with a large glass of water Use a cool mist humidifier in your bedroom at night and keep the house as cool as possible Take antibiotic as directed Take prednisone as directed Continue Proventil inhaler as a rescue inhaler for wheezing and shortness of breath. 1-2 puffs every 4-6 hours as needed Use Symbicort 160/4.5   2 puffs every 12 hours and rinse mouth after using++++++ Use saline nose spray frequently during the day

## 2014-08-29 NOTE — Progress Notes (Signed)
Subjective:    Patient ID: Alexandria Irwin, female    DOB: [birth year], 69 y.o.   MRN: [id number]  HPI Patient is here today complaining with cough, wheezing and fever. Patient has not had a fever today.    Review of Systems  Constitutional: Positive for fever.  HENT: Positive for congestion.   Eyes: Negative.   Respiratory: Positive for cough and wheezing.   Cardiovascular: Negative.   Gastrointestinal: Negative.   Endocrine: Negative.   Genitourinary: Negative.   Musculoskeletal: Negative.   Skin: Negative.   Allergic/Immunologic: Negative.   Neurological: Negative.   Hematological: Negative.   Psychiatric/Behavioral: Negative.      Patient Active Problem List   Diagnosis Date Noted  . Vitamin D deficiency   . Thrombophlebitis leg 09/18/2013  . Dysuria 09/18/2013  . HTN (hypertension) 12/03/2012  . Diabetes 12/03/2012  . Unspecified hypothyroidism 12/03/2012  . Other and unspecified hyperlipidemia 12/03/2012   Outpatient Encounter Prescriptions as of 08/29/2014  Medication Sig  . aspirin 325 MG EC tablet Take 325 mg by mouth daily.  . B Complex Vitamins (VITAMIN B-COMPLEX PO) Take 1 tablet by mouth daily.  . Calcium Citrate-Vitamin D ([employer]S CALCIUM CITRATE +D PO) Take 2 tablets by mouth every evening.   . cloNIDine (CATAPRES) 0.1 MG tablet Take 1 tablet (0.1 mg total) by mouth 2 (two) times daily.  . cycloSPORINE (RESTASIS) 0.05 % ophthalmic emulsion Place 1 drop into both eyes 2 (two) times daily.   . Ergocalciferol (VITAMIN D2) 2000 UNITS TABS Take 5,000 tablets by mouth daily.   . furosemide (LASIX) 40 MG tablet Take 1 tablet (40 mg total) by mouth daily.  Marland Kitchen levothyroxine (SYNTHROID, LEVOTHROID) 100 MCG tablet Take 1 tablet (100 mcg total) by mouth daily.  Marland Kitchen losartan (COZAAR) 100 MG tablet Take 1 tablet (100 mg total) by mouth daily.  . Magnesium 250 MG TABS Take 1 tablet by mouth every evening.   . metFORMIN (GLUCOPHAGE) 500 MG tablet Take 1 tablet (500 mg total) by  mouth 2 (two) times daily with a meal.  . metoprolol succinate (TOPROL-XL) 50 MG 24 hr tablet TAKE 1 TABLET (50 MG TOTAL) BY MOUTH 2 (TWO) TIMES DAILY. TAKE WITH OR IMMEDIATELY FOLLOWING A MEAL.  Marland Kitchen Omega-3 Fatty Acids (FISH OIL) 1200 MG CAPS Take 1,200 mg by mouth daily.  . simvastatin (ZOCOR) 40 MG tablet Take 1 tablet (40 mg total) by mouth daily at 6 PM.  . vitamin C (ASCORBIC ACID) 500 MG tablet Take 500 mg by mouth daily.  Marland Kitchen albuterol (PROVENTIL HFA;VENTOLIN HFA) 108 (90 BASE) MCG/ACT inhaler Inhale 2 puffs into the lungs every 6 (six) hours as needed for wheezing or shortness of breath. (Patient not taking: Reported on 07/21/2014)  . [DISCONTINUED] loratadine (CLARITIN) 10 MG tablet Take 1 tablet (10 mg total) by mouth daily. (Patient not taking: Reported on 07/21/2014)  . [DISCONTINUED] losartan (COZAAR) 100 MG tablet TAKE 1 TABLET (100 MG TOTAL) BY MOUTH DAILY.  . [DISCONTINUED] metFORMIN (GLUCOPHAGE) 500 MG tablet TAKE 1 TABLET (500 MG TOTAL) BY MOUTH 2 (TWO) TIMES DAILY WITH A MEAL.      Objective:   Physical Exam  Constitutional: She is oriented to person, place, and time. She appears well-developed and well-nourished. No distress.  HENT:  Head: Normocephalic and atraumatic.  Right Ear: External ear normal.  Left Ear: External ear normal.  Mouth/Throat: Oropharynx is clear and moist.  Nasal congestion left greater than right. Maxillary and ethmoid sinus tenderness  Eyes: Conjunctivae and EOM  are normal. Pupils are equal, round, and reactive to light. Right eye exhibits no discharge. Left eye exhibits no discharge. No scleral icterus.  Neck: Normal range of motion. Neck supple. No thyromegaly present.  Left anterior cervical tenderness without adenopathy  Cardiovascular: Normal rate, regular rhythm and normal heart sounds.   No murmur heard. Pulmonary/Chest: Effort normal and breath sounds normal. No respiratory distress. She has no wheezes. She has no rales. She exhibits no  tenderness.  The patient has a dry irritated cough and cannot take a deep breath without coughing. There are no rales or wheezes today.  Abdominal: Soft. Bowel sounds are normal. She exhibits no mass. There is no tenderness. There is no rebound and no guarding.  Musculoskeletal: Normal range of motion. She exhibits no edema.  Lymphadenopathy:    She has no cervical adenopathy.  Neurological: She is alert and oriented to person, place, and time.  Skin: Skin is warm and dry. No rash noted.  Psychiatric: She has a normal mood and affect. Her behavior is normal. Judgment and thought content normal.  Nursing note and vitals reviewed.   BP 130/78 mmHg  Pulse 77  Temp(Src) 98.6 F (37 C) (Oral)  Ht 5\' 2"  (1.575 m)  Wt 189 lb 6.4 oz (85.911 kg)  BMI 34.63 kg/m2  [facility] reading (PRIMARY) by  Dr. Brunilda Payor x-ray-- left middle lobe pneumonia  Results for orders placed or performed in visit on 08/29/14  POCT CBC  Result Value Ref Range   WBC 5.9 4.6 - 10.2 K/uL   Lymph, poc 2.1 0.6 - 3.4   POC LYMPH PERCENT 35.8 10 - 50 %L   POC Granulocyte 3.4 2 - 6.9   Granulocyte percent 57.0 37 - 80 %G   RBC 4.67 4.04 - 5.48 M/uL   Hemoglobin 13.6 12.2 - 16.2 g/dL   HCT, POC 42.9 37.7 - 47.9 %   MCV 91.8 80 - 97 fL   MCH, POC 29.1 27 - 31.2 pg   MCHC 31.7 (A) 31.8 - 35.4 g/dL   RDW, POC 13.2 %   Platelet Count, POC 206.0 142 - 424 K/uL   MPV 7.7 0 - 99.8 fL                                           Assessment & Plan:   1. Bronchitis with bronchospasm -Drink plenty of fluids, take Tylenol for aches pains and fever, use cool mist humidification and keep house as cool as possible -Take Mucinex maximum strength 1 twice daily as discussed - POCT CBC - DG Chest 2 View; Future - azithromycin (ZITHROMAX) 250 MG tablet; 2 pills the first day then one daily for infection until completed  Dispense: 6 tablet; Refill: 0 - predniSONE (DELTASONE) 10 MG tablet; 1 tablet 4 times a day for 2 days,  1  tablet 3 times a day for 2 days,  1 tablet 2 times a day for 2 days, 1 tablet daily for 2 days  Dispense: 20 tablet; Refill: 0  2. URI (upper respiratory infection) -Use saline nose spray - POCT CBC - azithromycin (ZITHROMAX) 250 MG tablet; 2 pills the first day then one daily for infection until completed  Dispense: 6 tablet; Refill: 0  3. Community-acquired pneumonia -Follow directions with cough medicine take antibiotic prednisone and drink plenty of fluids and get rest  Meds ordered this encounter  Medications  .  azithromycin (ZITHROMAX) 250 MG tablet    Sig: 2 pills the first day then one daily for infection until completed    Dispense:  6 tablet    Refill:  0  . predniSONE (DELTASONE) 10 MG tablet    Sig: 1 tablet 4 times a day for 2 days,  1 tablet 3 times a day for 2 days,  1 tablet 2 times a day for 2 days, 1 tablet daily for 2 days    Dispense:  20 tablet    Refill:  0   Patient Instructions  Drink plenty of fluids Take Tylenol for aches pains and fever Use Mucinex maximum strength, blue and white in color, 1 twice daily with a large glass of water Use a cool mist humidifier in your bedroom at night and keep the house as cool as possible Take antibiotic as directed Take prednisone as directed Continue Proventil inhaler as a rescue inhaler for wheezing and shortness of breath. 1-2 puffs every 4-6 hours as needed Use Symbicort 160/4.5   2 puffs every 12 hours and rinse mouth after using++++++ Use saline nose spray frequently during the day   Arrie Senate MD

## 2014-09-05 ENCOUNTER — Encounter: Payer: Self-pay | Admitting: Family Medicine

## 2014-09-05 ENCOUNTER — Ambulatory Visit (INDEPENDENT_AMBULATORY_CARE_PROVIDER_SITE_OTHER): Payer: Medicare Other | Admitting: Family Medicine

## 2014-09-05 VITALS — BP 126/70 | HR 50 | Temp 97.1°F | Ht 62.0 in | Wt 185.0 lb

## 2014-09-05 DIAGNOSIS — E119 Type 2 diabetes mellitus without complications: Secondary | ICD-10-CM

## 2014-09-05 DIAGNOSIS — R5381 Other malaise: Secondary | ICD-10-CM | POA: Diagnosis not present

## 2014-09-05 DIAGNOSIS — R5383 Other fatigue: Secondary | ICD-10-CM

## 2014-09-05 DIAGNOSIS — J189 Pneumonia, unspecified organism: Secondary | ICD-10-CM

## 2014-09-05 NOTE — Progress Notes (Signed)
Subjective:    Patient ID: Azaylea Maves, female    DOB: [birth year], 69 y.o.   MRN: [id number]  HPI Patient here today for 1 week follow up from pneumonia. She states she does feel better, but still has some congestion. She finished her antibiotic yesterday. She still has 1 more prednisone to complete and she is using Symbicort regularly and has not had to use the rescue inhaler. The chest x-ray results were reviewed with the patient.         Patient Active Problem List   Diagnosis Date Noted  . Vitamin D deficiency   . Thrombophlebitis leg 09/18/2013  . Dysuria 09/18/2013  . HTN (hypertension) 12/03/2012  . Diabetes 12/03/2012  . Unspecified hypothyroidism 12/03/2012  . Other and unspecified hyperlipidemia 12/03/2012   Outpatient Encounter Prescriptions as of 09/05/2014  Medication Sig  . albuterol (PROVENTIL HFA;VENTOLIN HFA) 108 (90 BASE) MCG/ACT inhaler Inhale 2 puffs into the lungs every 6 (six) hours as needed for wheezing or shortness of breath.  Marland Kitchen aspirin 325 MG EC tablet Take 325 mg by mouth daily.  . B Complex Vitamins (VITAMIN B-COMPLEX PO) Take 1 tablet by mouth daily.  . budesonide-formoterol (SYMBICORT) 80-4.5 MCG/ACT inhaler Inhale 2 puffs into the lungs 2 (two) times daily.  . Calcium Citrate-Vitamin D ([employer]S CALCIUM CITRATE +D PO) Take 2 tablets by mouth every evening.   . cloNIDine (CATAPRES) 0.1 MG tablet Take 1 tablet (0.1 mg total) by mouth 2 (two) times daily.  . cycloSPORINE (RESTASIS) 0.05 % ophthalmic emulsion Place 1 drop into both eyes 2 (two) times daily.   . Ergocalciferol (VITAMIN D2) 2000 UNITS TABS Take 5,000 tablets by mouth daily.   . furosemide (LASIX) 40 MG tablet Take 1 tablet (40 mg total) by mouth daily.  Marland Kitchen levothyroxine (SYNTHROID, LEVOTHROID) 100 MCG tablet Take 1 tablet (100 mcg total) by mouth daily.  Marland Kitchen losartan (COZAAR) 100 MG tablet Take 1 tablet (100 mg total) by mouth daily.  . Magnesium 250 MG TABS Take 1 tablet by mouth every evening.     . metFORMIN (GLUCOPHAGE) 500 MG tablet Take 1 tablet (500 mg total) by mouth 2 (two) times daily with a meal.  . metoprolol succinate (TOPROL-XL) 50 MG 24 hr tablet TAKE 1 TABLET (50 MG TOTAL) BY MOUTH 2 (TWO) TIMES DAILY. TAKE WITH OR IMMEDIATELY FOLLOWING A MEAL.  Marland Kitchen Omega-3 Fatty Acids (FISH OIL) 1200 MG CAPS Take 1,200 mg by mouth daily.  . predniSONE (DELTASONE) 10 MG tablet 1 tablet 4 times a day for 2 days,  1 tablet 3 times a day for 2 days,  1 tablet 2 times a day for 2 days, 1 tablet daily for 2 days  . simvastatin (ZOCOR) 40 MG tablet Take 1 tablet (40 mg total) by mouth daily at 6 PM.  . vitamin C (ASCORBIC ACID) 500 MG tablet Take 500 mg by mouth daily.  . [DISCONTINUED] levofloxacin (LEVAQUIN) 500 MG tablet Take 1 tablet (500 mg total) by mouth daily.     Review of Systems  Constitutional: Negative.  Negative for fever.  HENT: Positive for congestion.   Eyes: Negative.   Respiratory: Positive for cough.   Cardiovascular: Negative.   Gastrointestinal: Negative.   Endocrine: Negative.   Genitourinary: Negative.   Musculoskeletal: Negative.   Skin: Negative.   Allergic/Immunologic: Negative.   Neurological: Negative.   Hematological: Negative.   Psychiatric/Behavioral: Negative.        Objective:   Physical Exam  Constitutional: She is oriented  to person, place, and time. She appears well-developed and well-nourished. No distress.  The patient is alert and appears to be in good spirits.  HENT:  Head: Normocephalic and atraumatic.  Right Ear: External ear normal.  Left Ear: External ear normal.  Mouth/Throat: Oropharynx is clear and moist.  Minimal nasal congestion  Eyes: Conjunctivae and EOM are normal. Pupils are equal, round, and reactive to light. Right eye exhibits no discharge. Left eye exhibits no discharge. No scleral icterus.  Neck: Normal range of motion. Neck supple. No thyromegaly present.  Cardiovascular: Normal rate, regular rhythm and normal heart  sounds.   No murmur heard. Pulmonary/Chest: Effort normal and breath sounds normal. No respiratory distress. She has no wheezes. She has no rales. She exhibits no tenderness.   normal breath sounds bilaterally and with coughing there was minimal congestion  Abdominal: Bowel sounds are normal. She exhibits no mass.  Musculoskeletal: Normal range of motion. She exhibits no edema.  Lymphadenopathy:    She has no cervical adenopathy.  Neurological: She is alert and oriented to person, place, and time.  Skin: Skin is warm and dry. No rash noted.  Psychiatric: She has a normal mood and affect. Her behavior is normal. Judgment and thought content normal.  Nursing note and vitals reviewed.   BP 126/70 mmHg  Pulse 50  Temp(Src) 97.1 F (36.2 C) (Oral)  Ht 5\' 2"  (1.575 m)  Wt 185 lb (83.915 kg)  BMI 33.83 kg/m2       Assessment & Plan:  1. Lingular pneumonia -The patient is improved as a result of the treatment. -She should continue to take her Mucinex and use her long acting bronchodilator until she comes back for another visit in about 3 weeks at which time we will get another chest x-ray.  2. Malaise and fatigue -Monitor blood sugars closely and keep these under good control  3. Type 2 diabetes mellitus without complication -Continue to check and monitor closely  Patient Instructions  Continue taking Mucinex Continue taking Tylenol as needed for aches pains and fever Finish prednisone Continue current Symbicort and then reduce to lower strength long-acting bronchodilator until next visit in 3 weeks   Arrie Senate MD

## 2014-09-05 NOTE — Patient Instructions (Addendum)
Continue taking Mucinex Continue taking Tylenol as needed for aches pains and fever Finish prednisone Continue current Symbicort and then reduce to lower strength long-acting bronchodilator until next visit in 3 weeks

## 2014-09-21 ENCOUNTER — Encounter: Payer: Self-pay | Admitting: *Deleted

## 2014-09-27 ENCOUNTER — Encounter: Payer: Self-pay | Admitting: Family Medicine

## 2014-09-27 ENCOUNTER — Ambulatory Visit (INDEPENDENT_AMBULATORY_CARE_PROVIDER_SITE_OTHER): Payer: Medicare Other

## 2014-09-27 ENCOUNTER — Ambulatory Visit (INDEPENDENT_AMBULATORY_CARE_PROVIDER_SITE_OTHER): Payer: Medicare Other | Admitting: Family Medicine

## 2014-09-27 VITALS — BP 128/71 | HR 62 | Temp 97.4°F | Ht 62.0 in | Wt 190.0 lb

## 2014-09-27 DIAGNOSIS — J189 Pneumonia, unspecified organism: Secondary | ICD-10-CM

## 2014-09-27 DIAGNOSIS — R5381 Other malaise: Secondary | ICD-10-CM

## 2014-09-27 DIAGNOSIS — R5383 Other fatigue: Secondary | ICD-10-CM

## 2014-09-27 NOTE — Patient Instructions (Signed)
Continue to drink plenty of fluids Continue using your inhaler once daily and rinsing her mouth after use---Breo Ellipta After 2 weeks, keep the Symbicort on hand and use it if you have a flare, 2 puffs twice daily and rinse her mouth after using Avoid irritating environments Keep using the Mucinex at least for one more month

## 2014-09-27 NOTE — Progress Notes (Signed)
Subjective:    Patient ID: Gizell Danser, female    DOB: 09/21/1945, 69 y.o.   MRN: [id number]  HPI  Patient here today for 4 week follow up on pneumonia. She states she is feeling much better, but still tires quickly. The patient is doing better and she is still using her inhaler and has just run out. She will get a chest x-ray today.      Patient Active Problem List   Diagnosis Date Noted  . Vitamin D deficiency   . Thrombophlebitis leg 09/18/2013  . Dysuria 09/18/2013  . HTN (hypertension) 12/03/2012  . Diabetes 12/03/2012  . Unspecified hypothyroidism 12/03/2012  . Other and unspecified hyperlipidemia 12/03/2012   Outpatient Encounter Prescriptions as of 09/27/2014  Medication Sig  . albuterol (PROVENTIL HFA;VENTOLIN HFA) 108 (90 BASE) MCG/ACT inhaler Inhale 2 puffs into the lungs every 6 (six) hours as needed for wheezing or shortness of breath.  Marland Kitchen aspirin 325 MG EC tablet Take 325 mg by mouth daily.  . B Complex Vitamins (VITAMIN B-COMPLEX PO) Take 1 tablet by mouth daily.  . Calcium Citrate-Vitamin D (CVS CALCIUM CITRATE +D PO) Take 2 tablets by mouth every evening.   . cloNIDine (CATAPRES) 0.1 MG tablet Take 1 tablet (0.1 mg total) by mouth 2 (two) times daily.  . cycloSPORINE (RESTASIS) 0.05 % ophthalmic emulsion Place 1 drop into both eyes 2 (two) times daily.   . Ergocalciferol (VITAMIN D2) 2000 UNITS TABS Take 5,000 tablets by mouth daily.   . furosemide (LASIX) 40 MG tablet Take 1 tablet (40 mg total) by mouth daily.  Marland Kitchen levothyroxine (SYNTHROID, LEVOTHROID) 100 MCG tablet Take 1 tablet (100 mcg total) by mouth daily.  Marland Kitchen losartan (COZAAR) 100 MG tablet Take 1 tablet (100 mg total) by mouth daily.  . Magnesium 250 MG TABS Take 1 tablet by mouth every evening.   . metFORMIN (GLUCOPHAGE) 500 MG tablet Take 1 tablet (500 mg total) by mouth 2 (two) times daily with a meal.  . metoprolol succinate (TOPROL-XL) 50 MG 24 hr tablet TAKE 1 TABLET (50 MG TOTAL) BY MOUTH 2 (TWO) TIMES  DAILY. TAKE WITH OR IMMEDIATELY FOLLOWING A MEAL.  Marland Kitchen Omega-3 Fatty Acids (FISH OIL) 1200 MG CAPS Take 1,200 mg by mouth daily.  . simvastatin (ZOCOR) 40 MG tablet Take 1 tablet (40 mg total) by mouth daily at 6 PM.  . vitamin C (ASCORBIC ACID) 500 MG tablet Take 500 mg by mouth daily.  . budesonide-formoterol (SYMBICORT) 80-4.5 MCG/ACT inhaler Inhale 2 puffs into the lungs 2 (two) times daily. (Patient not taking: Reported on 09/27/2014)  . [DISCONTINUED] predniSONE (DELTASONE) 10 MG tablet 1 tablet 4 times a day for 2 days,  1 tablet 3 times a day for 2 days,  1 tablet 2 times a day for 2 days, 1 tablet daily for 2 days    Review of Systems  Constitutional: Positive for fatigue.  HENT: Negative.   Eyes: Negative.   Respiratory: Negative.   Cardiovascular: Negative.   Gastrointestinal: Negative.   Endocrine: Negative.   Genitourinary: Negative.   Musculoskeletal: Negative.   Skin: Negative.   Allergic/Immunologic: Negative.   Neurological: Negative.   Hematological: Negative.   Psychiatric/Behavioral: Negative.        Objective:   Physical Exam  Constitutional: She is oriented to person, place, and time. She appears well-developed and well-nourished. No distress.  HENT:  Head: Normocephalic and atraumatic.  Right Ear: External ear normal.  Nose: Nose normal.  Mouth/Throat: Oropharynx is  clear and moist. No oropharyngeal exudate.  Eyes: Conjunctivae and EOM are normal. Pupils are equal, round, and reactive to light. Right eye exhibits no discharge. Left eye exhibits no discharge. No scleral icterus.  Neck: Normal range of motion. Neck supple. No thyromegaly present.  Cardiovascular: Normal rate, regular rhythm and normal heart sounds.   No murmur heard. Pulmonary/Chest: Effort normal and breath sounds normal. No respiratory distress. She has no wheezes. She has no rales.  Musculoskeletal: Normal range of motion.  Lymphadenopathy:    She has no cervical adenopathy.    Neurological: She is alert and oriented to person, place, and time.  Skin: Skin is warm and dry. No rash noted.  Psychiatric: She has a normal mood and affect. Her behavior is normal. Judgment and thought content normal.  Nursing note and vitals reviewed.  BP 128/71 mmHg  Pulse 62  Temp(Src) 97.4 F (36.3 C) (Oral)  Ht 5\' 2"  (1.575 m)  Wt 190 lb (86.183 kg)  BMI 34.74 kg/m2  [facility] reading (PRIMARY) by  Dr. Brunilda Payor x-ray-   lingula pneumonia resolved                                    Assessment & Plan:  1. Pneumonia, organism unspecified -The patient is feeling better and has completed all of her antibiotics and she is just on an inhaler at this time. - DG Chest 2 View; Future  2. Malaise and fatigue -She is encouraged to increase her activity and to try to get her strength back following this illness  Patient Instructions  Continue to drink plenty of fluids Continue using your inhaler once daily and rinsing her mouth after use---Breo Ellipta After 2 weeks, keep the Symbicort on hand and use it if you have a flare, 2 puffs twice daily and rinse her mouth after using Avoid irritating environments Keep using the Mucinex at least for one more month   Arrie Senate MD

## 2014-10-17 ENCOUNTER — Other Ambulatory Visit: Payer: Self-pay | Admitting: *Deleted

## 2014-10-17 MED ORDER — GLUCOSE BLOOD VI STRP: ORAL_STRIP | Status: DC

## 2014-10-25 ENCOUNTER — Encounter: Payer: Self-pay | Admitting: Family Medicine

## 2014-10-25 ENCOUNTER — Ambulatory Visit (INDEPENDENT_AMBULATORY_CARE_PROVIDER_SITE_OTHER): Payer: Medicare Other | Admitting: Family Medicine

## 2014-10-25 VITALS — BP 115/64 | HR 62 | Temp 97.6°F | Ht 62.0 in | Wt 191.0 lb

## 2014-10-25 DIAGNOSIS — J9801 Acute bronchospasm: Secondary | ICD-10-CM | POA: Diagnosis not present

## 2014-10-25 DIAGNOSIS — J301 Allergic rhinitis due to pollen: Secondary | ICD-10-CM | POA: Diagnosis not present

## 2014-10-25 NOTE — Progress Notes (Signed)
Subjective:    Patient ID: Alexandria Irwin, female    DOB: [birth year], 69 y.o.   MRN: [id number]  HPI Patient here today for 4 week follow up on pneumonia. This is 8 weeks out from the diagnosis. The last chest x-ray about 4 weeks ago indicated that the pneumonia had completely cleared. The patient still  Brio. She has very dry mucous membranes in her eyes and nasal passages. She is not able to tolerate Claritin for long periods of time because it affects her eyes. She cannot use Flonase because it aggravates sores in her nose. She is currently using saline nose spray frequently and saline gel. She is feeling much better overall and has not used any inhalers for a couple weeks. She indicates she's not been wheezing any either.       Patient Active Problem List   Diagnosis Date Noted  . Vitamin D deficiency   . Thrombophlebitis leg 09/18/2013  . Dysuria 09/18/2013  . HTN (hypertension) 12/03/2012  . Diabetes 12/03/2012  . Unspecified hypothyroidism 12/03/2012  . Other and unspecified hyperlipidemia 12/03/2012   Outpatient Encounter Prescriptions as of 10/25/2014  Medication Sig  . albuterol (PROVENTIL HFA;VENTOLIN HFA) 108 (90 BASE) MCG/ACT inhaler Inhale 2 puffs into the lungs every 6 (six) hours as needed for wheezing or shortness of breath.  Marland Kitchen aspirin 325 MG EC tablet Take 325 mg by mouth daily.  . B Complex Vitamins (VITAMIN B-COMPLEX PO) Take 1 tablet by mouth daily.  . Calcium Citrate-Vitamin D (CVS CALCIUM CITRATE +D PO) Take 2 tablets by mouth every evening.   . cloNIDine (CATAPRES) 0.1 MG tablet Take 1 tablet (0.1 mg total) by mouth 2 (two) times daily.  . cycloSPORINE (RESTASIS) 0.05 % ophthalmic emulsion Place 1 drop into both eyes 2 (two) times daily.   . Ergocalciferol (VITAMIN D2) 2000 UNITS TABS Take 1 tablet by mouth daily. 1, 5,000 tab daily.  . furosemide (LASIX) 40 MG tablet Take 1 tablet (40 mg total) by mouth daily.  Marland Kitchen glucose blood test strip One Touch Verio test  strips. Test qd. DX E11.9  . levothyroxine (SYNTHROID, LEVOTHROID) 100 MCG tablet Take 1 tablet (100 mcg total) by mouth daily.  Marland Kitchen losartan (COZAAR) 100 MG tablet Take 1 tablet (100 mg total) by mouth daily.  . Magnesium 250 MG TABS Take 1 tablet by mouth every evening.   . metFORMIN (GLUCOPHAGE) 500 MG tablet Take 1 tablet (500 mg total) by mouth 2 (two) times daily with a meal.  . metoprolol succinate (TOPROL-XL) 50 MG 24 hr tablet TAKE 1 TABLET (50 MG TOTAL) BY MOUTH 2 (TWO) TIMES DAILY. TAKE WITH OR IMMEDIATELY FOLLOWING A MEAL.  Marland Kitchen Omega-3 Fatty Acids (FISH OIL) 1200 MG CAPS Take 1,200 mg by mouth daily.  . simvastatin (ZOCOR) 40 MG tablet Take 1 tablet (40 mg total) by mouth daily at 6 PM.  . vitamin C (ASCORBIC ACID) 500 MG tablet Take 500 mg by mouth daily.  . [DISCONTINUED] budesonide-formoterol (SYMBICORT) 80-4.5 MCG/ACT inhaler Inhale 2 puffs into the lungs 2 (two) times daily. (Patient not taking: Reported on 09/27/2014)    Review of Systems  Constitutional: Negative.   HENT: Positive for congestion and postnasal drip.   Eyes: Negative.   Respiratory: Negative.   Cardiovascular: Negative.   Gastrointestinal: Negative.   Endocrine: Negative.   Genitourinary: Negative.   Musculoskeletal: Negative.   Skin: Negative.   Allergic/Immunologic: Negative.   Neurological: Negative.   Hematological: Negative.   Psychiatric/Behavioral: Negative.  Objective:   Physical Exam  Constitutional: She is oriented to person, place, and time. She appears well-developed and well-nourished. No distress.  HENT:  Head: Normocephalic and atraumatic.  Right Ear: External ear normal.  Left Ear: External ear normal.  Mouth/Throat: Oropharynx is clear and moist. No oropharyngeal exudate.  Nasal septal irritation right side and minimal nasal congestion bilaterally with erythema.  Eyes: Conjunctivae and EOM are normal. Pupils are equal, round, and reactive to light. Right eye exhibits no  discharge. Left eye exhibits no discharge. No scleral icterus.  Neck: Normal range of motion. Neck supple.  Cardiovascular: Normal rate, regular rhythm and normal heart sounds.   No murmur heard. Pulmonary/Chest: Effort normal and breath sounds normal. She has no wheezes. She has no rales.  Slightly tight cough  Musculoskeletal: Normal range of motion. She exhibits no edema.  Lymphadenopathy:    She has no cervical adenopathy.  Neurological: She is oriented to person, place, and time.  Skin: Skin is warm and dry. No rash noted.  Psychiatric: She has a normal mood and affect. Her behavior is normal. Thought content normal.  Nursing note and vitals reviewed.  BP 115/64 mmHg  Pulse 62  Temp(Src) 97.6 F (36.4 C) (Oral)  Ht 5\' 2"  (1.575 m)  Wt 191 lb (86.637 kg)  BMI 34.93 kg/m2        Assessment & Plan:  1. Allergic rhinitis due to pollen -Use nasal saline and nasal gel -Take a half of a Claritin if needed for more allergy coverage   2. Bronchospasm -Including pneumonia and the bronchospasm have resolved and the patient is doing well off the long-acting bronchodilator at this time.  Patient Instructions  Continue to drink plenty of fluid Continue to use nasal saline solution and gel Use the Symbicort inhaler if you have more wheezing Restart the Mucinex plain, maximum strength, blue and white in color and take one twice daily with a large glass of water Return to the office if any more wheezing develops   Arrie Senate MD

## 2014-10-25 NOTE — Patient Instructions (Signed)
Continue to drink plenty of fluid Continue to use nasal saline solution and gel Use the Symbicort inhaler if you have more wheezing Restart the Mucinex plain, maximum strength, blue and white in color and take one twice daily with a large glass of water Return to the office if any more wheezing develops

## 2014-11-08 ENCOUNTER — Ambulatory Visit (INDEPENDENT_AMBULATORY_CARE_PROVIDER_SITE_OTHER): Payer: Medicare Other | Admitting: Family Medicine

## 2014-11-08 ENCOUNTER — Encounter: Payer: Self-pay | Admitting: Family Medicine

## 2014-11-08 VITALS — BP 147/82 | HR 54 | Temp 97.2°F | Ht 62.0 in | Wt 194.4 lb

## 2014-11-08 DIAGNOSIS — E039 Hypothyroidism, unspecified: Secondary | ICD-10-CM

## 2014-11-08 DIAGNOSIS — I10 Essential (primary) hypertension: Secondary | ICD-10-CM | POA: Diagnosis not present

## 2014-11-08 DIAGNOSIS — E119 Type 2 diabetes mellitus without complications: Secondary | ICD-10-CM | POA: Diagnosis not present

## 2014-11-08 DIAGNOSIS — E785 Hyperlipidemia, unspecified: Secondary | ICD-10-CM | POA: Diagnosis not present

## 2014-11-08 LAB — POCT GLYCOSYLATED HEMOGLOBIN (HGB A1C): HEMOGLOBIN A1C: 8.2

## 2014-11-08 MED ORDER — METFORMIN HCL 500 MG PO TABS: 500.0000 mg | ORAL_TABLET | Freq: Two times a day (BID) | ORAL | Status: DC

## 2014-11-08 MED ORDER — MECLIZINE HCL 50 MG PO TABS: 50.0000 mg | ORAL_TABLET | Freq: Two times a day (BID) | ORAL | Status: DC | PRN

## 2014-11-08 NOTE — Addendum Note (Signed)
Addended by: Jamelle Haring on: 11/08/2014 11:07 AM   Modules accepted: Orders

## 2014-11-08 NOTE — Progress Notes (Signed)
Subjective:    Patient ID: Alexandria Irwin, female    DOB: 01-Sep-1945, 69 y.o.   MRN: [id number]  HPI 69 year old female here to follow-up diabetes, hypothyroidism, and hypertension. Her last A1c was elevated. She has our time sticking to her diabetic diet. Today she is also complaining of some dizziness. In the past she's had a history of dizziness and what has been called in a rear infection. She denies chest pain numbness tingling or burning in her feet.  Patient Active Problem List   Diagnosis Date Noted  . Vitamin D deficiency   . Thrombophlebitis leg 09/18/2013  . Dysuria 09/18/2013  . HTN (hypertension) 12/03/2012  . Diabetes 12/03/2012  . Hypothyroidism 12/03/2012  . Hyperlipidemia 12/03/2012   Outpatient Encounter Prescriptions as of 11/08/2014  Medication Sig  . albuterol (PROVENTIL HFA;VENTOLIN HFA) 108 (90 BASE) MCG/ACT inhaler Inhale 2 puffs into the lungs every 6 (six) hours as needed for wheezing or shortness of breath.  Marland Kitchen aspirin 325 MG EC tablet Take 325 mg by mouth daily.  . B Complex Vitamins (VITAMIN B-COMPLEX PO) Take 1 tablet by mouth daily.  . Calcium Citrate-Vitamin D (CVS CALCIUM CITRATE +D PO) Take 2 tablets by mouth every evening.   . cloNIDine (CATAPRES) 0.1 MG tablet Take 1 tablet (0.1 mg total) by mouth 2 (two) times daily.  . cycloSPORINE (RESTASIS) 0.05 % ophthalmic emulsion Place 1 drop into both eyes 2 (two) times daily.   . Ergocalciferol (VITAMIN D2) 2000 UNITS TABS Take 1 tablet by mouth daily. 1, 5,000 tab daily.  . furosemide (LASIX) 40 MG tablet Take 1 tablet (40 mg total) by mouth daily.  Marland Kitchen glucose blood test strip One Touch Verio test strips. Test qd. DX E11.9  . levothyroxine (SYNTHROID, LEVOTHROID) 100 MCG tablet Take 1 tablet (100 mcg total) by mouth daily.  Marland Kitchen losartan (COZAAR) 100 MG tablet Take 1 tablet (100 mg total) by mouth daily.  . Magnesium 250 MG TABS Take 1 tablet by mouth every evening.   . metFORMIN (GLUCOPHAGE) 500 MG tablet Take 1  tablet (500 mg total) by mouth 2 (two) times daily with a meal.  . metoprolol succinate (TOPROL-XL) 50 MG 24 hr tablet TAKE 1 TABLET (50 MG TOTAL) BY MOUTH 2 (TWO) TIMES DAILY. TAKE WITH OR IMMEDIATELY FOLLOWING A MEAL.  Marland Kitchen Omega-3 Fatty Acids (FISH OIL) 1200 MG CAPS Take 1,200 mg by mouth daily.  . simvastatin (ZOCOR) 40 MG tablet Take 1 tablet (40 mg total) by mouth daily at 6 PM.  . vitamin C (ASCORBIC ACID) 500 MG tablet Take 500 mg by mouth daily.   No facility-administered encounter medications on file as of 11/08/2014.      Review of Systems  Constitutional: Negative.   HENT: Negative.   Eyes: Negative.   Respiratory: Negative.   Cardiovascular: Negative.   Gastrointestinal: Negative.   Endocrine: Negative.   Genitourinary: Negative.   Neurological: Positive for dizziness.  Hematological: Negative.   Psychiatric/Behavioral: Negative.        Objective:   Physical Exam  Constitutional: She is oriented to person, place, and time. She appears well-developed and well-nourished.  Eyes: Conjunctivae and EOM are normal.  Neck: Normal range of motion. Neck supple.  Cardiovascular: Normal rate, regular rhythm and normal heart sounds.   Pulmonary/Chest: Effort normal and breath sounds normal.  Abdominal: Soft. Bowel sounds are normal.  Musculoskeletal: Normal range of motion.  Neurological: She is alert and oriented to person, place, and time. She has normal reflexes.  Skin: Skin is warm and dry.  Psychiatric: She has a normal mood and affect. Her behavior is normal. Thought content normal.          Assessment & Plan:  1. Essential hypertension Blood pressure is up a little bit today compared to last visit but generally is well controlled on losartan and clonidine  2. Type 2 diabetes mellitus without complication Currently takes metformin 500 mg twice a day. As noted above last A1c was not at target. Check A1c today - POCT glycosylated hemoglobin (Hb A1C)  3.  Hypothyroidism, unspecified hypothyroidism type Thyroid supplement was increased last year and last TSH was in the therapeutic range but it is been 1 year - TSH  4. Hyperlipidemia Lipids are at goal continue same medicine

## 2014-11-09 LAB — TSH: TSH: 1.67 u[IU]/mL (ref 0.450–4.500)

## 2014-11-10 ENCOUNTER — Ambulatory Visit: Payer: Medicare Other | Admitting: Family Medicine

## 2014-12-01 ENCOUNTER — Other Ambulatory Visit: Payer: Self-pay

## 2014-12-01 MED ORDER — ONETOUCH DELICA LANCETS 33G MISC: 1.0000 | Freq: Every day | Status: DC

## 2014-12-08 ENCOUNTER — Other Ambulatory Visit: Payer: Self-pay | Admitting: *Deleted

## 2014-12-08 MED ORDER — FUROSEMIDE 40 MG PO TABS: 40.0000 mg | ORAL_TABLET | Freq: Every day | ORAL | Status: DC

## 2014-12-08 MED ORDER — SIMVASTATIN 40 MG PO TABS: 40.0000 mg | ORAL_TABLET | Freq: Every day | ORAL | Status: DC

## 2014-12-08 MED ORDER — CLONIDINE HCL 0.1 MG PO TABS: 0.1000 mg | ORAL_TABLET | Freq: Two times a day (BID) | ORAL | Status: DC

## 2015-01-16 ENCOUNTER — Other Ambulatory Visit: Payer: Self-pay | Admitting: *Deleted

## 2015-01-16 MED ORDER — METOPROLOL SUCCINATE ER 50 MG PO TB24: ORAL_TABLET | ORAL | Status: DC

## 2015-01-26 ENCOUNTER — Encounter: Payer: Self-pay | Admitting: *Deleted

## 2015-01-28 ENCOUNTER — Other Ambulatory Visit: Payer: Self-pay | Admitting: Nurse Practitioner

## 2015-02-02 ENCOUNTER — Other Ambulatory Visit: Payer: Self-pay | Admitting: Nurse Practitioner

## 2015-03-02 ENCOUNTER — Other Ambulatory Visit: Payer: Self-pay | Admitting: Family Medicine

## 2015-03-02 ENCOUNTER — Ambulatory Visit (INDEPENDENT_AMBULATORY_CARE_PROVIDER_SITE_OTHER): Payer: Medicare Other | Admitting: Family Medicine

## 2015-03-02 ENCOUNTER — Encounter: Payer: Self-pay | Admitting: Family Medicine

## 2015-03-02 ENCOUNTER — Ambulatory Visit (INDEPENDENT_AMBULATORY_CARE_PROVIDER_SITE_OTHER): Payer: Medicare Other

## 2015-03-02 VITALS — BP 129/75 | HR 51 | Temp 97.8°F | Ht 62.0 in | Wt 184.0 lb

## 2015-03-02 DIAGNOSIS — E559 Vitamin D deficiency, unspecified: Secondary | ICD-10-CM

## 2015-03-02 DIAGNOSIS — Z780 Asymptomatic menopausal state: Secondary | ICD-10-CM | POA: Diagnosis not present

## 2015-03-02 DIAGNOSIS — E119 Type 2 diabetes mellitus without complications: Secondary | ICD-10-CM | POA: Diagnosis not present

## 2015-03-02 LAB — POCT UA - MICROALBUMIN: Microalbumin Ur, POC: 20 mg/L

## 2015-03-02 LAB — POCT GLYCOSYLATED HEMOGLOBIN (HGB A1C): Hemoglobin A1C: 7.8

## 2015-03-02 NOTE — Progress Notes (Signed)
Subjective:    Patient ID: Alexandria Irwin, female    DOB: [birth year], 69 y.o.   MRN: [id number]  HPI  69 year old female here to follow-up diabetes, hypertension, hyperlipidemia, and vitamin D deficiency. She has no symptoms or problems today. She thinks her sugars of been in better control. Her last A1c was 8.2. Currently she is taking maintenance doses of vitamin D but has been supplemented with high-dose vitamin D previously. Thyroid function was normal and May 2016. She takes losartan and metoprolol for blood pressure   Review of Systems  Constitutional: Negative.   Respiratory: Negative.   Cardiovascular: Negative.   Gastrointestinal: Negative.   Genitourinary: Negative.   Neurological: Negative.   Psychiatric/Behavioral: Negative.        Objective:   Physical Exam  Constitutional: She is oriented to person, place, and time. She appears well-developed and well-nourished.  Cardiovascular: Normal rate, regular rhythm and intact distal pulses.   Pulmonary/Chest: Effort normal and breath sounds normal.  Neurological: She is alert and oriented to person, place, and time.  Psychiatric: She has a normal mood and affect.          Assessment & Plan:  1. Type 2 diabetes mellitus without complication If B5Z is still elevated would increase metformin - POCT glycosylated hemoglobin (Hb A1C) - POCT UA - Microalbumin  2. Post-menopausal Patient at risk for osteopenia  Wardell Honour MD - DG Bone Density; Future  Patient Active Problem List   Diagnosis Date Noted  . Vitamin D deficiency   . Thrombophlebitis leg 09/18/2013  . Dysuria 09/18/2013  . HTN (hypertension) 12/03/2012  . Diabetes 12/03/2012  . Hypothyroidism 12/03/2012  . Hyperlipidemia 12/03/2012   Outpatient Encounter Prescriptions as of 03/02/2015  Medication Sig  . albuterol (PROVENTIL HFA;VENTOLIN HFA) 108 (90 BASE) MCG/ACT inhaler Inhale 2 puffs into the lungs every 6 (six) hours as needed for wheezing or  shortness of breath.  Marland Kitchen aspirin 325 MG EC tablet Take 325 mg by mouth daily.  . B Complex Vitamins (VITAMIN B-COMPLEX PO) Take 1 tablet by mouth daily.  . Calcium Citrate-Vitamin D (CVS CALCIUM CITRATE +D PO) Take 2 tablets by mouth every evening.   . cloNIDine (CATAPRES) 0.1 MG tablet Take 1 tablet (0.1 mg total) by mouth 2 (two) times daily.  . cycloSPORINE (RESTASIS) 0.05 % ophthalmic emulsion Place 1 drop into both eyes 2 (two) times daily.   . Ergocalciferol (VITAMIN D2) 2000 UNITS TABS Take 1 tablet by mouth daily. 1, 5,000 tab daily.  . furosemide (LASIX) 40 MG tablet Take 1 tablet (40 mg total) by mouth daily.  Marland Kitchen glucose blood test strip One Touch Verio test strips. Test qd. DX E11.9  . levothyroxine (SYNTHROID, LEVOTHROID) 100 MCG tablet Take 1 tablet (100 mcg total) by mouth daily.  Marland Kitchen losartan (COZAAR) 100 MG tablet Take 1 tablet (100 mg total) by mouth daily.  Marland Kitchen losartan (COZAAR) 100 MG tablet TAKE 1 TABLET (100 MG TOTAL) BY MOUTH DAILY.  . Magnesium 250 MG TABS Take 1 tablet by mouth every evening.   . meclizine (ANTIVERT) 50 MG tablet Take 1 tablet (50 mg total) by mouth 2 (two) times daily as needed.  . metFORMIN (GLUCOPHAGE) 500 MG tablet TAKE 1 TABLET (500 MG TOTAL) BY MOUTH 2 (TWO) TIMES DAILY WITH A MEAL.  . metoprolol succinate (TOPROL-XL) 50 MG 24 hr tablet TAKE 1 TABLET (50 MG TOTAL) BY MOUTH 2 (TWO) TIMES DAILY. TAKE WITH OR IMMEDIATELY FOLLOWING A MEAL.  Marland Kitchen Omega-3 Fatty  Acids (FISH OIL) 1200 MG CAPS Take 1,200 mg by mouth daily.  Glory Rosebush DELICA LANCETS 49F MISC 1 Stick by Does not apply route daily.  . simvastatin (ZOCOR) 40 MG tablet Take 1 tablet (40 mg total) by mouth daily at 6 PM.  . vitamin C (ASCORBIC ACID) 500 MG tablet Take 500 mg by mouth daily.  . [DISCONTINUED] losartan (COZAAR) 100 MG tablet TAKE 1 TABLET (100 MG TOTAL) BY MOUTH DAILY.  . [DISCONTINUED] metFORMIN (GLUCOPHAGE) 500 MG tablet Take 1 tablet (500 mg total) by mouth 2 (two) times daily with a  meal.   No facility-administered encounter medications on file as of 03/02/2015.

## 2015-03-02 NOTE — Progress Notes (Signed)
   Subjective:    Patient ID: Alexandria Irwin, female    DOB: 1946-06-17, 69 y.o.   MRN: [id number]  HPI  Patient Active Problem List   Diagnosis Date Noted  . Vitamin D deficiency   . Thrombophlebitis leg 09/18/2013  . Dysuria 09/18/2013  . HTN (hypertension) 12/03/2012  . Diabetes 12/03/2012  . Hypothyroidism 12/03/2012  . Hyperlipidemia 12/03/2012   Outpatient Encounter Prescriptions as of 03/02/2015  Medication Sig  . albuterol (PROVENTIL HFA;VENTOLIN HFA) 108 (90 BASE) MCG/ACT inhaler Inhale 2 puffs into the lungs every 6 (six) hours as needed for wheezing or shortness of breath.  Marland Kitchen aspirin 325 MG EC tablet Take 325 mg by mouth daily.  . B Complex Vitamins (VITAMIN B-COMPLEX PO) Take 1 tablet by mouth daily.  . Calcium Citrate-Vitamin D (CVS CALCIUM CITRATE +D PO) Take 2 tablets by mouth every evening.   . cloNIDine (CATAPRES) 0.1 MG tablet Take 1 tablet (0.1 mg total) by mouth 2 (two) times daily.  . cycloSPORINE (RESTASIS) 0.05 % ophthalmic emulsion Place 1 drop into both eyes 2 (two) times daily.   . Ergocalciferol (VITAMIN D2) 2000 UNITS TABS Take 1 tablet by mouth daily. 1, 5,000 tab daily.  . furosemide (LASIX) 40 MG tablet Take 1 tablet (40 mg total) by mouth daily.  Marland Kitchen glucose blood test strip One Touch Verio test strips. Test qd. DX E11.9  . levothyroxine (SYNTHROID, LEVOTHROID) 100 MCG tablet Take 1 tablet (100 mcg total) by mouth daily.  Marland Kitchen losartan (COZAAR) 100 MG tablet Take 1 tablet (100 mg total) by mouth daily.  Marland Kitchen losartan (COZAAR) 100 MG tablet TAKE 1 TABLET (100 MG TOTAL) BY MOUTH DAILY.  . Magnesium 250 MG TABS Take 1 tablet by mouth every evening.   . meclizine (ANTIVERT) 50 MG tablet Take 1 tablet (50 mg total) by mouth 2 (two) times daily as needed.  . metFORMIN (GLUCOPHAGE) 500 MG tablet TAKE 1 TABLET (500 MG TOTAL) BY MOUTH 2 (TWO) TIMES DAILY WITH A MEAL.  . metoprolol succinate (TOPROL-XL) 50 MG 24 hr tablet TAKE 1 TABLET (50 MG TOTAL) BY MOUTH 2 (TWO) TIMES  DAILY. TAKE WITH OR IMMEDIATELY FOLLOWING A MEAL.  Marland Kitchen Omega-3 Fatty Acids (FISH OIL) 1200 MG CAPS Take 1,200 mg by mouth daily.  Glory Rosebush DELICA LANCETS 29U MISC 1 Stick by Does not apply route daily.  . simvastatin (ZOCOR) 40 MG tablet Take 1 tablet (40 mg total) by mouth daily at 6 PM.  . vitamin C (ASCORBIC ACID) 500 MG tablet Take 500 mg by mouth daily.  . [DISCONTINUED] losartan (COZAAR) 100 MG tablet TAKE 1 TABLET (100 MG TOTAL) BY MOUTH DAILY.  . [DISCONTINUED] metFORMIN (GLUCOPHAGE) 500 MG tablet Take 1 tablet (500 mg total) by mouth 2 (two) times daily with a meal.   No facility-administered encounter medications on file as of 03/02/2015.      Review of Systems     Objective:   Physical Exam        Assessment & Plan:

## 2015-03-02 NOTE — Addendum Note (Signed)
Addended by: Earlene Plater on: 03/02/2015 10:19 AM   Modules accepted: Orders

## 2015-03-02 NOTE — Progress Notes (Signed)
Patient aware.

## 2015-03-03 ENCOUNTER — Telehealth: Payer: Self-pay | Admitting: Family Medicine

## 2015-03-03 ENCOUNTER — Other Ambulatory Visit: Payer: Self-pay | Admitting: *Deleted

## 2015-03-03 LAB — MICROALBUMIN, URINE: Microalbumin, Urine: 14.6 ug/mL

## 2015-03-03 MED ORDER — METFORMIN HCL 500 MG PO TABS: ORAL_TABLET | ORAL | Status: DC

## 2015-03-03 NOTE — Telephone Encounter (Signed)
Patient aware that rx has been corrected.

## 2015-04-11 ENCOUNTER — Other Ambulatory Visit: Payer: Self-pay | Admitting: Family Medicine

## 2015-04-17 ENCOUNTER — Ambulatory Visit (INDEPENDENT_AMBULATORY_CARE_PROVIDER_SITE_OTHER): Payer: Medicare Other

## 2015-04-17 DIAGNOSIS — Z23 Encounter for immunization: Secondary | ICD-10-CM

## 2015-05-09 ENCOUNTER — Other Ambulatory Visit: Payer: Self-pay | Admitting: Family Medicine

## 2015-05-10 ENCOUNTER — Other Ambulatory Visit: Payer: Self-pay | Admitting: *Deleted

## 2015-05-10 DIAGNOSIS — E039 Hypothyroidism, unspecified: Secondary | ICD-10-CM

## 2015-05-10 MED ORDER — LEVOTHYROXINE SODIUM 100 MCG PO TABS: 100.0000 ug | ORAL_TABLET | Freq: Every day | ORAL | Status: DC

## 2015-05-10 MED ORDER — METOPROLOL SUCCINATE ER 50 MG PO TB24: ORAL_TABLET | ORAL | Status: DC

## 2015-05-20 ENCOUNTER — Other Ambulatory Visit: Payer: Self-pay | Admitting: Family Medicine

## 2015-05-21 ENCOUNTER — Other Ambulatory Visit: Payer: Self-pay | Admitting: Family Medicine

## 2015-05-23 ENCOUNTER — Other Ambulatory Visit: Payer: Self-pay

## 2015-05-23 MED ORDER — LOSARTAN POTASSIUM 100 MG PO TABS: 100.0000 mg | ORAL_TABLET | Freq: Every day | ORAL | Status: DC

## 2015-06-19 ENCOUNTER — Other Ambulatory Visit: Payer: Self-pay | Admitting: *Deleted

## 2015-06-19 ENCOUNTER — Encounter (INDEPENDENT_AMBULATORY_CARE_PROVIDER_SITE_OTHER): Payer: Medicare Other

## 2015-06-19 DIAGNOSIS — I868 Varicose veins of other specified sites: Secondary | ICD-10-CM

## 2015-06-19 MED ORDER — METOPROLOL SUCCINATE ER 50 MG PO TB24: ORAL_TABLET | ORAL | Status: DC

## 2015-07-12 ENCOUNTER — Encounter: Payer: Self-pay | Admitting: Family Medicine

## 2015-07-12 ENCOUNTER — Other Ambulatory Visit: Payer: Self-pay | Admitting: *Deleted

## 2015-07-12 ENCOUNTER — Ambulatory Visit (INDEPENDENT_AMBULATORY_CARE_PROVIDER_SITE_OTHER): Payer: Medicare Other | Admitting: Family Medicine

## 2015-07-12 DIAGNOSIS — E559 Vitamin D deficiency, unspecified: Secondary | ICD-10-CM

## 2015-07-12 DIAGNOSIS — E785 Hyperlipidemia, unspecified: Secondary | ICD-10-CM

## 2015-07-12 DIAGNOSIS — I10 Essential (primary) hypertension: Secondary | ICD-10-CM

## 2015-07-12 DIAGNOSIS — E119 Type 2 diabetes mellitus without complications: Secondary | ICD-10-CM | POA: Diagnosis not present

## 2015-07-12 LAB — POCT GLYCOSYLATED HEMOGLOBIN (HGB A1C): HEMOGLOBIN A1C: 7.3

## 2015-07-12 NOTE — Progress Notes (Signed)
Subjective:    Patient ID: Alexandria Irwin, female    DOB: 03/25/1946, 70 y.o.   MRN: [id number]  HPI 70 year old female who is here to follow-up diabetes, lipids, and blood pressure. She has no new complaints or symptoms today. A1c 4 months ago was 7.8 and metformin was increased from 500 twice a day to 1000 mg in a.m. And 500 night. He is compliant in taking her medicines. Blood pressures have been at target and her LDL cholesterol was at a low 31, one year ago. She has had low vitamin D levels in the past and currently takes 5000 units of vitamin D daily and we will check a level today.  Patient Active Problem List   Diagnosis Date Noted  . Vitamin D deficiency   . Thrombophlebitis leg 09/18/2013  . Dysuria 09/18/2013  . HTN (hypertension) 12/03/2012  . Diabetes ([place]) 12/03/2012  . Hypothyroidism 12/03/2012  . Hyperlipidemia 12/03/2012   Outpatient Encounter Prescriptions as of 07/12/2015  Medication Sig  . albuterol (PROVENTIL HFA;VENTOLIN HFA) 108 (90 BASE) MCG/ACT inhaler Inhale 2 puffs into the lungs every 6 (six) hours as needed for wheezing or shortness of breath.  Marland Kitchen aspirin 325 MG EC tablet Take 325 mg by mouth daily.  . B Complex Vitamins (VITAMIN B-COMPLEX PO) Take 1 tablet by mouth daily.  . Calcium Citrate-Vitamin D (CVS CALCIUM CITRATE +D PO) Take 2 tablets by mouth every evening.   . cloNIDine (CATAPRES) 0.1 MG tablet TAKE 1 TABLET (0.1 MG TOTAL) BY MOUTH 2 (TWO) TIMES DAILY.  . cycloSPORINE (RESTASIS) 0.05 % ophthalmic emulsion Place 1 drop into both eyes 2 (two) times daily.   . Ergocalciferol (VITAMIN D2) 2000 UNITS TABS Take 1 tablet by mouth daily. 1, 5,000 tab daily.  . furosemide (LASIX) 40 MG tablet TAKE 1 TABLET (40 MG TOTAL) BY MOUTH DAILY.  Marland Kitchen glucose blood test strip One Touch Verio test strips. Test qd. DX E11.9  . levothyroxine (SYNTHROID, LEVOTHROID) 100 MCG tablet Take 1 tablet (100 mcg total) by mouth daily.  Marland Kitchen losartan (COZAAR) 100 MG tablet Take 1 tablet (100  mg total) by mouth daily.  . Magnesium 250 MG TABS Take 1 tablet by mouth every evening.   . meclizine (ANTIVERT) 50 MG tablet Take 1 tablet (50 mg total) by mouth 2 (two) times daily as needed.  . metFORMIN (GLUCOPHAGE) 500 MG tablet TAKE 2 TABLETS (1000 MG) IN THE AM AND 1 TABLET (500MG) IN THE PM.  . metoprolol succinate (TOPROL-XL) 50 MG 24 hr tablet TAKE 1 TABLET (50 MG TOTAL) BY MOUTH 2 (TWO) TIMES DAILY. TAKE WITH OR IMMEDIATELY FOLLOWING A MEAL.  Marland Kitchen Omega-3 Fatty Acids (FISH OIL) 1200 MG CAPS Take 1,200 mg by mouth daily.  Glory Rosebush DELICA LANCETS 09X MISC 1 Stick by Does not apply route daily.  . simvastatin (ZOCOR) 40 MG tablet TAKE 1 TABLET (40 MG TOTAL) BY MOUTH DAILY AT 6 PM.  . vitamin C (ASCORBIC ACID) 500 MG tablet Take 500 mg by mouth daily.  . [DISCONTINUED] losartan (COZAAR) 100 MG tablet TAKE 1 TABLET (100 MG TOTAL) BY MOUTH DAILY.   No facility-administered encounter medications on file as of 07/12/2015.      Review of Systems  Constitutional: Negative.   Eyes: Negative.   Respiratory: Negative.   Cardiovascular: Negative.   Gastrointestinal: Negative.   Neurological: Negative.        Objective:   Physical Exam  Constitutional: She is oriented to person, place, and time. She appears  well-developed and well-nourished.  Cardiovascular: Normal rate, regular rhythm, normal heart sounds and intact distal pulses.   Pulmonary/Chest: Effort normal and breath sounds normal.  Musculoskeletal: Normal range of motion.  Neurological: She is alert and oriented to person, place, and time.  Psychiatric: She has a normal mood and affect. Her behavior is normal.          Assessment & Plan:  1. Type 2 diabetes mellitus without complication, without long-term current use of insulin (HCC) *Diabetes needs better control hopefully that was achieved with increase metformin dose. A1c is pending** - POCT glycosylated hemoglobin (Hb A1C) - CMP14+EGFR - Lipid panel - VITAMIN D 25  Hydroxy (Vit-D Deficiency, Fractures)  2. Essential hypertension A pressures controlled with combination losartan, clonidine, and metoprolol. - POCT glycosylated hemoglobin (Hb A1C) - CMP14+EGFR - Lipid panel - VITAMIN D 25 Hydroxy (Vit-D Deficiency, Fractures)  3. Hyperlipidemia LDL cholesterol was 31 may be able to reduce dose of statin today if still that level - POCT glycosylated hemoglobin (Hb A1C) - CMP14+EGFR - Lipid panel - VITAMIN D 25 Hydroxy (Vit-D Deficiency, Fractures)  4. Vitamin D deficiency Need vitamin D level to recommend appropriate supplementation - POCT glycosylated hemoglobin (Hb A1C) - CMP14+EGFR  Wardell Honour MD - Lipid panel - VITAMIN D 25 Hydroxy (Vit-D Deficiency, Fractures)

## 2015-07-13 LAB — CMP14+EGFR
ALK PHOS: 41 IU/L (ref 39–117)
ALT: 28 IU/L (ref 0–32)
AST: 23 IU/L (ref 0–40)
Albumin/Globulin Ratio: 1.6 (ref 1.1–2.5)
Albumin: 4.3 g/dL (ref 3.5–4.8)
BILIRUBIN TOTAL: 0.8 mg/dL (ref 0.0–1.2)
BUN/Creatinine Ratio: 16 (ref 11–26)
BUN: 14 mg/dL (ref 8–27)
CHLORIDE: 97 mmol/L (ref 96–106)
CO2: 27 mmol/L (ref 18–29)
Calcium: 10.2 mg/dL (ref 8.7–10.3)
Creatinine, Ser: 0.86 mg/dL (ref 0.57–1.00)
GFR calc Af Amer: 78 mL/min/{1.73_m2} (ref >59)
GFR calc non Af Amer: 68 mL/min/{1.73_m2} (ref >59)
GLUCOSE: 177 mg/dL — AB (ref 65–99)
Globulin, Total: 2.7 g/dL (ref 1.5–4.5)
Potassium: 4.4 mmol/L (ref 3.5–5.2)
Sodium: 141 mmol/L (ref 134–144)
Total Protein: 7 g/dL (ref 6.0–8.5)

## 2015-07-13 LAB — LIPID PANEL
CHOLESTEROL TOTAL: 121 mg/dL (ref 100–199)
Chol/HDL Ratio: 2.8 ratio units (ref 0.0–4.4)
HDL: 43 mg/dL (ref >39)
LDL Calculated: 36 mg/dL (ref 0–99)
Triglycerides: 212 mg/dL — ABNORMAL HIGH (ref 0–149)
VLDL CHOLESTEROL CAL: 42 mg/dL — AB (ref 5–40)

## 2015-07-13 LAB — VITAMIN D 25 HYDROXY (VIT D DEFICIENCY, FRACTURES): Vit D, 25-Hydroxy: 51.5 ng/mL (ref 30.0–100.0)

## 2015-07-25 ENCOUNTER — Other Ambulatory Visit: Payer: Self-pay

## 2015-07-25 MED ORDER — METOPROLOL SUCCINATE ER 50 MG PO TB24: ORAL_TABLET | ORAL | Status: DC

## 2015-08-10 DIAGNOSIS — Z961 Presence of intraocular lens: Secondary | ICD-10-CM | POA: Diagnosis not present

## 2015-08-10 DIAGNOSIS — E119 Type 2 diabetes mellitus without complications: Secondary | ICD-10-CM | POA: Diagnosis not present

## 2015-08-10 DIAGNOSIS — Z7984 Long term (current) use of oral hypoglycemic drugs: Secondary | ICD-10-CM | POA: Diagnosis not present

## 2015-08-10 DIAGNOSIS — H2512 Age-related nuclear cataract, left eye: Secondary | ICD-10-CM | POA: Diagnosis not present

## 2015-08-10 LAB — HM DIABETES EYE EXAM

## 2015-08-11 ENCOUNTER — Telehealth: Payer: Self-pay | Admitting: Family Medicine

## 2015-08-11 MED ORDER — GLUCOSE BLOOD VI STRP: ORAL_STRIP | Status: DC

## 2015-08-11 NOTE — Telephone Encounter (Signed)
done

## 2015-08-17 ENCOUNTER — Encounter: Payer: Self-pay | Admitting: *Deleted

## 2015-08-28 ENCOUNTER — Other Ambulatory Visit: Payer: Self-pay | Admitting: Family Medicine

## 2015-09-13 ENCOUNTER — Other Ambulatory Visit: Payer: Self-pay | Admitting: Family Medicine

## 2015-09-21 ENCOUNTER — Other Ambulatory Visit: Payer: Self-pay | Admitting: Family Medicine

## 2015-09-28 ENCOUNTER — Other Ambulatory Visit: Payer: Self-pay | Admitting: Family Medicine

## 2015-09-28 MED ORDER — ONETOUCH DELICA LANCETS 33G MISC: Status: DC

## 2015-09-28 NOTE — Telephone Encounter (Signed)
done

## 2015-10-11 ENCOUNTER — Other Ambulatory Visit: Payer: Self-pay | Admitting: Family Medicine

## 2015-10-23 ENCOUNTER — Other Ambulatory Visit: Payer: Self-pay | Admitting: Family Medicine

## 2015-11-11 ENCOUNTER — Other Ambulatory Visit: Payer: Self-pay | Admitting: Family Medicine

## 2015-11-20 ENCOUNTER — Other Ambulatory Visit: Payer: Self-pay | Admitting: Family Medicine

## 2015-11-20 DIAGNOSIS — E039 Hypothyroidism, unspecified: Secondary | ICD-10-CM

## 2015-11-20 MED ORDER — LEVOTHYROXINE SODIUM 100 MCG PO TABS: 100.0000 ug | ORAL_TABLET | Freq: Every day | ORAL | Status: DC

## 2015-11-20 NOTE — Telephone Encounter (Signed)
done

## 2015-11-27 ENCOUNTER — Telehealth: Payer: Medicare Other | Admitting: Family

## 2015-11-27 DIAGNOSIS — J209 Acute bronchitis, unspecified: Secondary | ICD-10-CM

## 2015-11-27 MED ORDER — BENZONATATE 100 MG PO CAPS: 100.0000 mg | ORAL_CAPSULE | Freq: Three times a day (TID) | ORAL | Status: DC | PRN

## 2015-11-27 MED ORDER — AZITHROMYCIN 250 MG PO TABS: ORAL_TABLET | ORAL | Status: DC

## 2015-11-27 NOTE — Progress Notes (Signed)

## 2015-12-11 ENCOUNTER — Other Ambulatory Visit: Payer: Self-pay | Admitting: Family Medicine

## 2015-12-15 ENCOUNTER — Other Ambulatory Visit: Payer: Self-pay

## 2015-12-15 MED ORDER — LOSARTAN POTASSIUM 100 MG PO TABS: 100.0000 mg | ORAL_TABLET | Freq: Every day | ORAL | Status: DC

## 2015-12-18 ENCOUNTER — Other Ambulatory Visit: Payer: Self-pay | Admitting: Family Medicine

## 2015-12-18 NOTE — Telephone Encounter (Signed)
LAST SEEN 07/12/15 Dr Sabra Heck  Last thyroid 11/08/14

## 2015-12-25 ENCOUNTER — Telehealth: Payer: Self-pay | Admitting: Family Medicine

## 2015-12-25 NOTE — Telephone Encounter (Signed)
Spoke with pt and advised it's ok to come in for labs that morning.

## 2016-01-09 ENCOUNTER — Encounter: Payer: Self-pay | Admitting: Family Medicine

## 2016-01-09 ENCOUNTER — Ambulatory Visit (INDEPENDENT_AMBULATORY_CARE_PROVIDER_SITE_OTHER): Payer: Medicare Other | Admitting: Family Medicine

## 2016-01-09 VITALS — BP 122/76 | HR 54 | Temp 97.0°F | Ht 62.0 in | Wt 185.6 lb

## 2016-01-09 DIAGNOSIS — I10 Essential (primary) hypertension: Secondary | ICD-10-CM | POA: Diagnosis not present

## 2016-01-09 DIAGNOSIS — E559 Vitamin D deficiency, unspecified: Secondary | ICD-10-CM

## 2016-01-09 DIAGNOSIS — E785 Hyperlipidemia, unspecified: Secondary | ICD-10-CM | POA: Diagnosis not present

## 2016-01-09 DIAGNOSIS — E039 Hypothyroidism, unspecified: Secondary | ICD-10-CM | POA: Diagnosis not present

## 2016-01-09 DIAGNOSIS — E119 Type 2 diabetes mellitus without complications: Secondary | ICD-10-CM

## 2016-01-09 LAB — BAYER DCA HB A1C WAIVED: HB A1C (BAYER DCA - WAIVED): 7.7 % — ABNORMAL HIGH (ref <7.0)

## 2016-01-09 MED ORDER — LORAZEPAM 0.5 MG PO TABS: ORAL_TABLET | ORAL | Status: DC

## 2016-01-09 NOTE — Patient Instructions (Signed)
Continue current medications. Continue good therapeutic lifestyle changes which include good diet and exercise. Fall precautions discussed with patient. If an FOBT was given today- please return it to our front desk. If you are over 72 years old - you may need Prevnar 51 or the adult Pneumonia vaccine.  Flu Shots will be available at our office starting mid- September. Please call and schedule a FLU CLINIC APPOINTMENT.

## 2016-01-09 NOTE — Progress Notes (Signed)
Subjective:    Patient ID: Alexandria Irwin, female    DOB: 1945-12-04, 70 y.o.   MRN: [id number]  HPI Patient is here today for a follow up on hypertension, hyperlipidemia, diabetes, and hypothyroidism. Patient is also complaining with fatigue and dizziness.  Patient admits to poor sleep hygiene. When she gets up in the morning she feels tired mostly because she hasn't slept the night before. She is reluctant to take any sleeping pill worrying that we'll make her drowsy the next day. Sometimes the dizziness seems to be positional but she has had no history of falls.   Review of Systems  Constitutional: Positive for fatigue.  HENT: Negative.   Eyes: Negative.   Respiratory: Negative.   Cardiovascular: Negative.   Gastrointestinal: Negative.   Endocrine: Negative.   Genitourinary: Negative.   Musculoskeletal: Negative.   Skin: Negative.   Allergic/Immunologic: Negative.   Neurological: Positive for dizziness.  Hematological: Negative.   Psychiatric/Behavioral: Negative.           Patient Active Problem List   Diagnosis Date Noted  . Vitamin D deficiency   . Thrombophlebitis leg 09/18/2013  . Dysuria 09/18/2013  . HTN (hypertension) 12/03/2012  . Diabetes ([place]) 12/03/2012  . Hypothyroidism 12/03/2012  . Hyperlipidemia 12/03/2012   Outpatient Encounter Prescriptions as of 01/09/2016  Medication Sig  . albuterol (PROVENTIL HFA;VENTOLIN HFA) 108 (90 BASE) MCG/ACT inhaler Inhale 2 puffs into the lungs every 6 (six) hours as needed for wheezing or shortness of breath.  Marland Kitchen aspirin 325 MG EC tablet Take 325 mg by mouth daily.  . B Complex Vitamins (VITAMIN B-COMPLEX PO) Take 1 tablet by mouth daily.  . Calcium Citrate-Vitamin D (CVS CALCIUM CITRATE +D PO) Take 2 tablets by mouth every evening.   . cloNIDine (CATAPRES) 0.1 MG tablet TAKE ONE TABLET BY MOUTH TWICE DAILY  . cycloSPORINE (RESTASIS) 0.05 % ophthalmic emulsion Place 1 drop into both eyes 2 (two) times daily.   .  Ergocalciferol (VITAMIN D2) 2000 UNITS TABS Take 1 tablet by mouth daily. 1, 5,000 tab daily.  . furosemide (LASIX) 40 MG tablet TAKE ONE TABLET BY MOUTH ONCE DAILY  . glucose blood test strip One Touch Verio test strips. Test qd. DX E11.9  . levothyroxine (SYNTHROID, LEVOTHROID) 100 MCG tablet TAKE ONE TABLET BY MOUTH ONCE DAILY  . losartan (COZAAR) 100 MG tablet Take 1 tablet (100 mg total) by mouth daily.  . Magnesium 250 MG TABS Take 1 tablet by mouth every evening.   . meclizine (ANTIVERT) 50 MG tablet Take 1 tablet (50 mg total) by mouth 2 (two) times daily as needed.  . metFORMIN (GLUCOPHAGE) 500 MG tablet TAKE TWO TABLETS BY MOUTH ONCE DAILY IN THE MORNING AND ONE ONCE DAILY IN THE EVENING  . metoprolol succinate (TOPROL-XL) 50 MG 24 hr tablet TAKE 1 TABLET (50 MG TOTAL) BY MOUTH 2 (TWO) TIMES DAILY. TAKE WITH OR IMMEDIATELY FOLLOWING A MEAL.  Marland Kitchen Omega-3 Fatty Acids (FISH OIL) 1200 MG CAPS Take 1,200 mg by mouth daily.  Glory Rosebush DELICA LANCETS 25O MISC Test once daily. E11.9  . simvastatin (ZOCOR) 40 MG tablet TAKE ONE TABLET BY MOUTH ONCE DAILY AT 6 PM  . vitamin C (ASCORBIC ACID) 500 MG tablet Take 500 mg by mouth daily.  . [DISCONTINUED] azithromycin (ZITHROMAX Z-PAK) 250 MG tablet As directed  . [DISCONTINUED] benzonatate (TESSALON) 100 MG capsule Take 1 capsule (100 mg total) by mouth 3 (three) times daily as needed for cough.   No facility-administered encounter  medications on file as of 01/09/2016.      Objective:   Physical Exam  Constitutional: She is oriented to person, place, and time. She appears well-developed and well-nourished.  Cardiovascular: Normal rate, regular rhythm and normal heart sounds.   Pulmonary/Chest: Effort normal.  Neurological: She is alert and oriented to person, place, and time. She exhibits normal muscle tone. Coordination normal.  Psychiatric: She has a normal mood and affect. Her behavior is normal.          Assessment & Plan:  1.  Essential hypertension Blood pressure is good when checked in the right arm. Pulses are not really palpable in the left arm - CMP14+EGFR  2. Hyperlipidemia She does take a statin. Lipids were at goal when last checked - Lipid panel  3. Hypothyroidism, unspecified hypothyroidism type Have not checked thyroid function and 1 year. Due to - Thyroid Panel With TSH  4. Type 2 diabetes mellitus without complication, without long-term current use of insulin (HCC) Last A1c was 7.3. Only medicine is metformin - [facility] DCA Hb A1c Waived  5. Vitamin D deficiency Vitamin D was last assessed about a year a r - VITAMIN D 25 Hydroxy (Vit-D Deficiency, Fractures) Wardell Honour MD

## 2016-01-10 ENCOUNTER — Other Ambulatory Visit: Payer: Self-pay | Admitting: Family Medicine

## 2016-01-10 LAB — CMP14+EGFR
A/G RATIO: 1.7 (ref 1.2–2.2)
ALT: 24 IU/L (ref 0–32)
AST: 25 IU/L (ref 0–40)
Albumin: 4.3 g/dL (ref 3.5–4.8)
Alkaline Phosphatase: 37 IU/L — ABNORMAL LOW (ref 39–117)
BUN/Creatinine Ratio: 16 (ref 12–28)
BUN: 14 mg/dL (ref 8–27)
Bilirubin Total: 0.6 mg/dL (ref 0.0–1.2)
CO2: 25 mmol/L (ref 18–29)
CREATININE: 0.86 mg/dL (ref 0.57–1.00)
Calcium: 9.8 mg/dL (ref 8.7–10.3)
Chloride: 98 mmol/L (ref 96–106)
GFR, EST AFRICAN AMERICAN: 78 mL/min/{1.73_m2} (ref >59)
GFR, EST NON AFRICAN AMERICAN: 68 mL/min/{1.73_m2} (ref >59)
GLOBULIN, TOTAL: 2.5 g/dL (ref 1.5–4.5)
Glucose: 145 mg/dL — ABNORMAL HIGH (ref 65–99)
Potassium: 5.6 mmol/L — ABNORMAL HIGH (ref 3.5–5.2)
SODIUM: 139 mmol/L (ref 134–144)
Total Protein: 6.8 g/dL (ref 6.0–8.5)

## 2016-01-10 LAB — LIPID PANEL
CHOL/HDL RATIO: 3 ratio (ref 0.0–4.4)
Cholesterol, Total: 118 mg/dL (ref 100–199)
HDL: 39 mg/dL — ABNORMAL LOW (ref >39)
LDL CALC: 42 mg/dL (ref 0–99)
TRIGLYCERIDES: 183 mg/dL — AB (ref 0–149)
VLDL Cholesterol Cal: 37 mg/dL (ref 5–40)

## 2016-01-10 LAB — THYROID PANEL WITH TSH
Free Thyroxine Index: 1.8 (ref 1.2–4.9)
T3 Uptake Ratio: 25 % (ref 24–39)
T4, Total: 7.1 ug/dL (ref 4.5–12.0)
TSH: 1.63 u[IU]/mL (ref 0.450–4.500)

## 2016-01-10 LAB — VITAMIN D 25 HYDROXY (VIT D DEFICIENCY, FRACTURES): VIT D 25 HYDROXY: 51 ng/mL (ref 30.0–100.0)

## 2016-01-15 ENCOUNTER — Other Ambulatory Visit: Payer: Self-pay | Admitting: Family Medicine

## 2016-01-29 ENCOUNTER — Telehealth: Payer: Self-pay | Admitting: Family Medicine

## 2016-01-29 MED ORDER — METOPROLOL SUCCINATE ER 50 MG PO TB24: ORAL_TABLET | ORAL | 5 refills | Status: DC

## 2016-01-29 NOTE — Telephone Encounter (Signed)
done

## 2016-03-15 ENCOUNTER — Other Ambulatory Visit: Payer: Self-pay | Admitting: Family Medicine

## 2016-03-15 ENCOUNTER — Other Ambulatory Visit: Payer: Self-pay | Admitting: *Deleted

## 2016-03-15 MED ORDER — LOSARTAN POTASSIUM 100 MG PO TABS: 100.0000 mg | ORAL_TABLET | Freq: Every day | ORAL | 0 refills | Status: DC

## 2016-04-04 DIAGNOSIS — Z23 Encounter for immunization: Secondary | ICD-10-CM | POA: Diagnosis not present

## 2016-05-17 ENCOUNTER — Encounter: Payer: Self-pay | Admitting: Family

## 2016-05-17 ENCOUNTER — Ambulatory Visit (INDEPENDENT_AMBULATORY_CARE_PROVIDER_SITE_OTHER): Payer: Medicare Other | Admitting: Family

## 2016-05-17 VITALS — BP 120/71 | HR 68 | Temp 97.2°F | Ht 62.0 in | Wt 183.0 lb

## 2016-05-17 DIAGNOSIS — N3001 Acute cystitis with hematuria: Secondary | ICD-10-CM

## 2016-05-17 DIAGNOSIS — R319 Hematuria, unspecified: Secondary | ICD-10-CM | POA: Diagnosis not present

## 2016-05-17 LAB — MICROSCOPIC EXAMINATION
RBC, UA: >30 /hpf — AB (ref 0–?)
WBC, UA: >30 /hpf — AB (ref 0–?)

## 2016-05-17 LAB — URINALYSIS, COMPLETE
BILIRUBIN UA: NEGATIVE
GLUCOSE, UA: NEGATIVE
KETONES UA: NEGATIVE
Nitrite, UA: NEGATIVE
SPEC GRAV UA: 1.025 (ref 1.005–1.030)
Urobilinogen, Ur: 0.2 mg/dL (ref 0.2–1.0)
pH, UA: 5 (ref 5.0–7.5)

## 2016-05-17 MED ORDER — NITROFURANTOIN MONOHYD MACRO 100 MG PO CAPS: 100.0000 mg | ORAL_CAPSULE | Freq: Two times a day (BID) | ORAL | 0 refills | Status: DC

## 2016-05-17 NOTE — Patient Instructions (Signed)
Asymptomatic Bacteriuria, Female  Asymptomatic bacteriuria is the presence of a large number of bacteria in your urine without the usual symptoms of burning or frequent urination. The following conditions increase the risk of asymptomatic bacteriuria:   Diabetes mellitus.   Advanced age.   Pregnancy in the first trimester.   Kidney stones.   Kidney transplants.   Leaky kidney tube valve in young children (reflux).  Treatment for this condition is not needed in most people and can lead to other problems such as too much yeast and growth of resistant bacteria. However, some people, such as pregnant women, do need treatment to prevent kidney infection. Asymptomatic bacteriuria in pregnancy is also associated with fetal growth restriction, premature labor, and newborn death.  HOME CARE INSTRUCTIONS  Monitor your condition for any changes. The following actions may help to relieve any discomfort you are feeling:   Drink enough water and fluids to keep your urine clear or pale yellow. Go to the bathroom more often to keep your bladder empty.   Keep the area around your vagina and rectum clean. Wipe yourself from front to back after urinating.  SEEK IMMEDIATE MEDICAL CARE IF:   You develop signs of an infection such as:    Burning with urination.    Frequency of voiding.    Back pain.    Fever.   You have blood in the urine.   You develop a fever.  MAKE SURE YOU:   Understand these instructions.   Will watch your condition.   Will get help right away if you are not doing well or get worse.     This information is not intended to replace advice given to you by your health care provider. Make sure you discuss any questions you have with your health care provider.     Document Released: 06/17/2005 Document Revised: 07/08/2014 Document Reviewed: 12/07/2012  [employer] Interactive Patient Education 2017 [employer]

## 2016-05-17 NOTE — Progress Notes (Signed)
   Subjective:    Patient ID: Alexandria Irwin, female    DOB: 1946/01/27, 70 y.o.   MRN: [id number]  Hematuria  This is a new problem. The current episode started today. The problem has been waxing and waning since onset. She describes the hematuria as gross hematuria. The pain is mild. Irritative symptoms include frequency and nocturia. Associated symptoms include abdominal pain, dysuria and genital pain. Pertinent negatives include no flank pain, nausea or vomiting.  Dizziness  Associated symptoms include abdominal pain. Pertinent negatives include no nausea or vomiting.      Review of Systems  Gastrointestinal: Positive for abdominal pain. Negative for nausea and vomiting.  Genitourinary: Positive for dysuria, frequency, hematuria and nocturia. Negative for flank pain.  Neurological: Positive for dizziness.  All other systems reviewed and are negative.      Objective:   Physical Exam  Constitutional: She is oriented to person, place, and time. She appears well-developed and well-nourished.  Cardiovascular: Normal rate, regular rhythm, normal heart sounds and intact distal pulses.   Pulmonary/Chest: Effort normal and breath sounds normal. No respiratory distress.  Abdominal: Soft. Bowel sounds are normal. She exhibits no distension. There is no tenderness. There is no rebound and no guarding.  Musculoskeletal: Normal range of motion. She exhibits no edema.  Neurological: She is alert and oriented to person, place, and time.      BP 120/71   Pulse 68   Temp 97.2 F (36.2 C) (Oral)   Ht 5\' 2"  (1.575 m)   Wt 183 lb (83 kg)   BMI 33.47 kg/m      Assessment & Plan:  1. Hematuria, unspecified type - Urinalysis, Complete  2. Acute cystitis with hematuria -Force fluids AZO over the counter X2 days RTO prn Culture pending - nitrofurantoin, macrocrystal-monohydrate, (MACROBID) 100 MG capsule; Take 1 capsule (100 mg total) by mouth 2 (two) times daily.  Dispense: 10 capsule;  Refill: 0 - Urine culture  Evelina Dun, FNP

## 2016-05-18 LAB — URINE CULTURE

## 2016-06-15 ENCOUNTER — Telehealth: Payer: Self-pay | Admitting: Family

## 2016-06-15 DIAGNOSIS — N3001 Acute cystitis with hematuria: Secondary | ICD-10-CM

## 2016-06-15 MED ORDER — NITROFURANTOIN MONOHYD MACRO 100 MG PO CAPS: 100.0000 mg | ORAL_CAPSULE | Freq: Two times a day (BID) | ORAL | 0 refills | Status: AC

## 2016-06-15 NOTE — Telephone Encounter (Signed)
Given a 5 day course of macrobid for 4 weeks ago. Symptoms somewhat resolved but not completely. She has been busy and hasn't followed up.  She now complains of gross hematuria after urination and she has some on her panty liner as well. She doesn't think it's vaginal bleeding but she is uncertain. She denies incontinence or leaking urine. She did have gross hematuria last month as well. She also complains of lower back pain and some dysuria. She denies any fever or chills. She is pot menopausal and has not had a hysterectomy. No history of kidney stones.   Urine culture at last visit was negative. But U/A showed a large amount of WBC and RBCs with moderate bacteria.   Patient requesting another course of antibiotics. Advised that we should schedule her an appointment for Monday and that I would forward this to the on call provider to see if another antibiotic is appropriate.

## 2016-06-15 NOTE — Telephone Encounter (Signed)
Patient notified that med sent and f/u appt scheduled for Monday.

## 2016-06-15 NOTE — Telephone Encounter (Signed)
Med sent.

## 2016-06-17 ENCOUNTER — Ambulatory Visit (INDEPENDENT_AMBULATORY_CARE_PROVIDER_SITE_OTHER): Payer: Medicare Other | Admitting: Family

## 2016-06-17 VITALS — BP 138/75 | HR 58 | Temp 98.2°F | Ht 62.0 in | Wt 187.8 lb

## 2016-06-17 DIAGNOSIS — N3001 Acute cystitis with hematuria: Secondary | ICD-10-CM | POA: Diagnosis not present

## 2016-06-17 DIAGNOSIS — R319 Hematuria, unspecified: Secondary | ICD-10-CM | POA: Diagnosis not present

## 2016-06-17 LAB — URINALYSIS, COMPLETE
Bilirubin, UA: NEGATIVE
NITRITE UA: NEGATIVE
Protein, UA: NEGATIVE
Specific Gravity, UA: 1.025 (ref 1.005–1.030)
UUROB: 0.2 mg/dL (ref 0.2–1.0)
pH, UA: 5 (ref 5.0–7.5)

## 2016-06-17 LAB — MICROSCOPIC EXAMINATION

## 2016-06-17 NOTE — Progress Notes (Signed)
   Subjective:    Patient ID: Alexandria Irwin, female    DOB: 08-31-1945, 70 y.o.   MRN: [id number]  Pt presents to the office for hematuria. Pt was given an antibiotic on Saturday and states the blood and dysuria is greatly improved.  Hematuria  This is a recurrent problem. The current episode started more than 1 month ago. The problem has been waxing and waning since onset. She describes the hematuria as gross hematuria. She is experiencing no pain. Irritative symptoms include nocturia. Associated symptoms include dysuria, flank pain and nausea. Pertinent negatives include no fever or vomiting.      Review of Systems  Constitutional: Negative for fever.  Gastrointestinal: Positive for nausea. Negative for vomiting.  Genitourinary: Positive for dysuria, flank pain, hematuria and nocturia.  All other systems reviewed and are negative.      Objective:   Physical Exam  Constitutional: She is oriented to person, place, and time. She appears well-developed and well-nourished.  Cardiovascular: Normal rate, regular rhythm, normal heart sounds and intact distal pulses.   Pulmonary/Chest: Effort normal and breath sounds normal.  Musculoskeletal: Normal range of motion.  Neurological: She is alert and oriented to person, place, and time.      BP 138/75   Pulse (!) 58   Temp 98.2 F (36.8 C) (Oral)   Ht 5\' 2"  (1.575 m)   Wt 187 lb 12.8 oz (85.2 kg)   BMI 34.35 kg/m      Assessment & Plan:  1. Hematuria, unspecified type - Urinalysis, Complete  2. Acute cystitis with hematuria -Continue nitrofurantoin  Force fluids AZO over the counter X2 days RTO prn Culture pending   Evelina Dun, FNP

## 2016-06-17 NOTE — Patient Instructions (Signed)
Acute Urinary Retention, Female Urinary retention means you are unable to pee completely or at all (empty your bladder). Follow these instructions at home:  Drink enough fluids to keep your pee (urine) clear or pale yellow.  If you are sent home with a tube that drains the bladder (catheter), there will be a drainage bag attached to it. There are two types of bags. One is big that you can wear at night without having to empty it. One is smaller and needs to be emptied more often.  Keep the drainage bag emptied.  Keep the drainage bag lower than the tube.  Only take medicine as told by your doctor. Contact a doctor if:  You have a low-grade fever.  You have spasms or you are leaking pee when you have spasms. Get help right away if:  You have chills or a fever.  Your catheter stops draining pee.  Your catheter falls out.  You have increased bleeding that does not stop after you have rested and increased the amount of fluids you had been drinking. This information is not intended to replace advice given to you by your health care provider. Make sure you discuss any questions you have with your health care provider. Document Released: 12/04/2007 Document Revised: 11/23/2015 Document Reviewed: 11/26/2012 Elsevier Interactive Patient Education  2017 [employer]vier Inc.  

## 2016-07-01 HISTORY — PX: CATARACT EXTRACTION W/ INTRAOCULAR LENS IMPLANT: SHX1309

## 2016-07-03 ENCOUNTER — Other Ambulatory Visit: Payer: Self-pay | Admitting: Family Medicine

## 2016-07-09 ENCOUNTER — Other Ambulatory Visit: Payer: Self-pay | Admitting: Family Medicine

## 2016-07-31 ENCOUNTER — Other Ambulatory Visit: Payer: Self-pay | Admitting: Family Medicine

## 2016-08-12 ENCOUNTER — Other Ambulatory Visit: Payer: Self-pay | Admitting: Family Medicine

## 2016-08-14 ENCOUNTER — Other Ambulatory Visit: Payer: Self-pay | Admitting: *Deleted

## 2016-08-14 ENCOUNTER — Other Ambulatory Visit: Payer: Self-pay | Admitting: Family Medicine

## 2016-08-14 MED ORDER — GLUCOSE BLOOD VI STRP: ORAL_STRIP | 11 refills | Status: DC

## 2016-08-20 ENCOUNTER — Telehealth: Payer: Self-pay | Admitting: Family Medicine

## 2016-08-20 MED ORDER — GLUCOSE BLOOD VI STRP: ORAL_STRIP | 11 refills | Status: DC

## 2016-08-20 NOTE — Telephone Encounter (Signed)
NA at [facility] x 3 - resent rx with Dx code

## 2016-09-30 ENCOUNTER — Encounter: Payer: Self-pay | Admitting: Family

## 2016-09-30 ENCOUNTER — Ambulatory Visit (INDEPENDENT_AMBULATORY_CARE_PROVIDER_SITE_OTHER): Payer: Medicare Other | Admitting: Family

## 2016-09-30 VITALS — BP 111/68 | HR 69 | Temp 97.0°F | Ht 62.0 in | Wt 184.2 lb

## 2016-09-30 DIAGNOSIS — R319 Hematuria, unspecified: Secondary | ICD-10-CM | POA: Diagnosis not present

## 2016-09-30 DIAGNOSIS — N3001 Acute cystitis with hematuria: Secondary | ICD-10-CM | POA: Diagnosis not present

## 2016-09-30 LAB — MICROSCOPIC EXAMINATION
RBC, UA: >30 /HPF — AB
Renal Epithel, UA: NONE SEEN /HPF

## 2016-09-30 LAB — URINALYSIS, COMPLETE
BILIRUBIN UA: NEGATIVE
Glucose, UA: NEGATIVE
Nitrite, UA: NEGATIVE
PH UA: 5 (ref 5.0–7.5)
Protein, UA: NEGATIVE
Specific Gravity, UA: 1.025 (ref 1.005–1.030)
Urobilinogen, Ur: 0.2 mg/dL (ref 0.2–1.0)

## 2016-09-30 MED ORDER — NITROFURANTOIN MONOHYD MACRO 100 MG PO CAPS
100.0000 mg | ORAL_CAPSULE | Freq: Two times a day (BID) | ORAL | 0 refills | Status: DC
Start: 2016-09-30 — End: 2016-11-15

## 2016-09-30 NOTE — Patient Instructions (Signed)

## 2016-09-30 NOTE — Progress Notes (Signed)
   Subjective:    Patient ID: Alexandria Irwin, female    DOB: Jul 18, 1945, 71 y.o.   MRN: [id number]  Hematuria  This is a recurrent problem. The current episode started 1 to 4 weeks ago. The problem has been rapidly improving since onset. Her pain is at a severity of 5/10. The pain is moderate. Irritative symptoms include frequency and urgency. Associated symptoms include abdominal pain, dysuria, flank pain and nausea. Pertinent negatives include no vomiting.      Review of Systems  Gastrointestinal: Positive for abdominal pain and nausea. Negative for vomiting.  Genitourinary: Positive for dysuria, flank pain, frequency, hematuria and urgency.  All other systems reviewed and are negative.      Objective:   Physical Exam  Constitutional: She is oriented to person, place, and time. She appears well-developed and well-nourished. No distress.  HENT:  Head: Normocephalic.  Eyes: Pupils are equal, round, and reactive to light.  Neck: Normal range of motion. Neck supple. No thyromegaly present.  Cardiovascular: Normal rate, regular rhythm, normal heart sounds and intact distal pulses.   No murmur heard. Pulmonary/Chest: Effort normal and breath sounds normal. No respiratory distress. She has no wheezes.  Abdominal: Soft. Bowel sounds are normal. She exhibits no distension. There is tenderness (lower abd tenderness).  Musculoskeletal: Normal range of motion. She exhibits no edema or tenderness.  Neurological: She is alert and oriented to person, place, and time.  Skin: Skin is warm and dry.  Psychiatric: She has a normal mood and affect. Her behavior is normal. Judgment and thought content normal.  Vitals reviewed.     BP 111/68   Pulse 69   Temp 97 F (36.1 C) (Oral)   Ht 5\' 2"  (1.575 m)   Wt 184 lb 3.2 oz (83.6 kg)   BMI 33.69 kg/m      Assessment & Plan:  1. Hematuria, unspecified type - Urinalysis, Complete - nitrofurantoin, macrocrystal-monohydrate, (MACROBID) 100 MG  capsule; Take 1 capsule (100 mg total) by mouth 2 (two) times daily.  Dispense: 10 capsule; Refill: 0 - Urine culture  2. Acute cystitis with hematuria Force fluids AZO over the counter X2 days RTO prn Culture pending - nitrofurantoin, macrocrystal-monohydrate, (MACROBID) 100 MG capsule; Take 1 capsule (100 mg total) by mouth 2 (two) times daily.  Dispense: 10 capsule; Refill: 0 - Urine culture   Evelina Dun, FNP

## 2016-10-04 ENCOUNTER — Telehealth: Payer: Self-pay | Admitting: Family

## 2016-10-04 LAB — URINE CULTURE

## 2016-10-04 MED ORDER — CIPROFLOXACIN HCL 500 MG PO TABS: 500.0000 mg | ORAL_TABLET | Freq: Two times a day (BID) | ORAL | 0 refills | Status: DC

## 2016-10-04 NOTE — Telephone Encounter (Signed)
Pt notified of RX Verbalizes understanding 

## 2016-10-04 NOTE — Telephone Encounter (Signed)
Pt can stop nitrofurantoin and start cipro.

## 2016-10-17 DIAGNOSIS — N950 Postmenopausal bleeding: Secondary | ICD-10-CM | POA: Diagnosis not present

## 2016-10-17 DIAGNOSIS — R300 Dysuria: Secondary | ICD-10-CM | POA: Diagnosis not present

## 2016-10-17 DIAGNOSIS — Z124 Encounter for screening for malignant neoplasm of cervix: Secondary | ICD-10-CM | POA: Diagnosis not present

## 2016-10-24 ENCOUNTER — Ambulatory Visit: Payer: Medicare Other

## 2016-11-14 ENCOUNTER — Telehealth: Payer: Self-pay | Admitting: Family

## 2016-11-14 NOTE — Telephone Encounter (Signed)
What symptoms do you have? Spotting  How long have you been sick? One week  Have you been seen for this problem? Yes  If your provider decides to give you a prescription, which pharmacy would you like for it to be sent to? [facility] in [place]   Patient informed that this information will be sent to the clinical staff for review and that they should receive a follow up call.

## 2016-11-14 NOTE — Telephone Encounter (Signed)
Patient had a pap smear and pelvic check per Dr. Deatra Ina at [facility] and he said things were normal.  She wonders if she should be sent to Urology? Not much pain with voiding. Spotting is usually on pad when she wakes in mornings this week.

## 2016-11-14 NOTE — Telephone Encounter (Signed)
Pt needs to be seen. Pt may still have UTI. If urine is negative will do referral.

## 2016-11-14 NOTE — Telephone Encounter (Signed)
Appt scheduled. Pt notified.

## 2016-11-15 ENCOUNTER — Encounter: Payer: Self-pay | Admitting: Family

## 2016-11-15 ENCOUNTER — Ambulatory Visit (INDEPENDENT_AMBULATORY_CARE_PROVIDER_SITE_OTHER): Payer: Medicare Other | Admitting: Family

## 2016-11-15 ENCOUNTER — Encounter (INDEPENDENT_AMBULATORY_CARE_PROVIDER_SITE_OTHER): Payer: Self-pay

## 2016-11-15 VITALS — BP 111/64 | HR 63 | Temp 97.3°F | Ht 62.0 in | Wt 187.0 lb

## 2016-11-15 DIAGNOSIS — N3001 Acute cystitis with hematuria: Secondary | ICD-10-CM

## 2016-11-15 DIAGNOSIS — R319 Hematuria, unspecified: Secondary | ICD-10-CM | POA: Diagnosis not present

## 2016-11-15 LAB — URINALYSIS, COMPLETE
BILIRUBIN UA: NEGATIVE
KETONES UA: NEGATIVE
LEUKOCYTES UA: NEGATIVE
Nitrite, UA: NEGATIVE
PROTEIN UA: NEGATIVE
SPEC GRAV UA: 1.01 (ref 1.005–1.030)
Urobilinogen, Ur: 0.2 mg/dL (ref 0.2–1.0)
pH, UA: 6.5 (ref 5.0–7.5)

## 2016-11-15 LAB — MICROSCOPIC EXAMINATION: Renal Epithel, UA: NONE SEEN /hpf

## 2016-11-15 MED ORDER — CIPROFLOXACIN HCL 500 MG PO TABS: 500.0000 mg | ORAL_TABLET | Freq: Two times a day (BID) | ORAL | 0 refills | Status: DC

## 2016-11-15 NOTE — Patient Instructions (Signed)
Asymptomatic Bacteriuria Asymptomatic bacteriuria is the presence of a large number of bacteria in the urine without the usual symptoms of burning or frequent urination. What are the causes? This condition is caused by an increase in bacteria in the urine. This increase can be caused by:  Bacteria entering the urinary tract, such as during sex.  A blockage in the urinary tract, such as from kidney stones or a tumor.  Bladder problems that prevent the bladder from emptying.  What increases the risk? You are more likely to develop this condition if:  You have diabetes mellitus.  You are an elderly adult, especially if you are also in a long-term care facility.  You are pregnant and in the first trimester.  You have kidney stones.  You are female.  You have had a kidney transplant.  You have a leaky kidney tube valve (reflux).  You had a urinary catheter for a long period of time.  What are the signs or symptoms? There are no symptoms of this condition. How is this diagnosed? This condition is diagnosed with a urine test. Because this condition does not cause symptoms, it is usually diagnosed when a urine sample is taken to treat or diagnose another condition, such as pregnancy or kidney problems. Most women who are in their first trimester of pregnancy are screened for asymptomatic bacteriuria. How is this treated? Usually, treatment is not needed for this condition. Treating the condition can lead to other problems, such as a yeast infection or the growth of bacteria that do not respond to treatment (antibiotic-resistant bacteria). Some people, such as pregnant women and people with kidney transplants, do need treatment with antibiotic medicines to prevent kidney infection (pyelonephritis). In pregnant women, kidney infection can lead to premature labor, fetal growth restriction, or newborn death. Follow these instructions at home: Medicines  Take over-the-counter and  prescription medicines only as told by your health care provider.  If you were prescribed an antibiotic medicine, take it as told by your health care provider. Do not stop taking the antibiotic even if you start to feel better. General instructions  Monitor your condition for any changes.  Drink enough fluid to keep your urine clear or pale yellow.  Go to the bathroom more often to keep your bladder empty.  If you are female, keep the area around your vagina and rectum clean. Wipe yourself from front to back after urinating.  Keep all follow-up visits as told by your health care provider. This is important. Contact a health care provider if:  You notice any new symptoms, such as back pain or burning while urinating. Get help right away if:  You develop signs of an infection such as: ? A burning sensation when you urinate. ? Have pain when you urinate. ? Develop an intense need to urinate. ? Urinating more frequently. ? Back pain or pelvic pain. ? Fever or chills.  You have blood in your urine.  Your urine becomes discolored or cloudy.  Your urine smells bad.  You have severe pain that cannot be controlled with medicine. Summary  Asymptomatic bacteriuria is the presence of a large number of bacteria in the urine without the usual symptoms of burning or frequent urination.  Usually, treatment is not needed for this condition. Treating the condition can lead to other problems, such as too much yeast and the growth of antibiotic-resistant bacteria.  Some people, such as pregnant women and people with kidney transplants, do need treatment with antibiotic medicines to prevent   kidney infection (pyelonephritis).  If you were prescribed an antibiotic medicine, take it as told by your health care provider. Do not stop taking the antibiotic even if you start to feel better. This information is not intended to replace advice given to you by your health care provider. Make sure you  discuss any questions you have with your health care provider. Document Released: 06/17/2005 Document Revised: 06/11/2016 Document Reviewed: 06/11/2016 Elsevier Interactive Patient Education  2017 Elsevier Inc.  

## 2016-11-15 NOTE — Progress Notes (Signed)
   Subjective:    Patient ID: Alexandria Irwin, female    DOB: Apr 29, 1946, 71 y.o.   MRN: [id number]  PT presents to the office today for hematuria. Pt had a negative pap and vaginal ultrasound to rule out cause.  Hematuria  This is a recurrent problem. The current episode started 1 to 4 weeks ago. The problem has been waxing and waning since onset. She describes the hematuria as microscopic hematuria. Her pain is at a severity of 2/10. The pain is mild. She describes her urine color as clear. Irritative symptoms include frequency and urgency. Irritative symptoms do not include nocturia. Associated symptoms include abdominal pain ("light'). Pertinent negatives include no flank pain, nausea or vomiting.      Review of Systems  Gastrointestinal: Positive for abdominal pain ("light'). Negative for nausea and vomiting.  Genitourinary: Positive for frequency, hematuria and urgency. Negative for flank pain and nocturia.  All other systems reviewed and are negative.      Objective:   Physical Exam  Constitutional: She is oriented to person, place, and time. She appears well-developed and well-nourished. No distress.  HENT:  Head: Normocephalic.  Cardiovascular: Normal rate, regular rhythm, normal heart sounds and intact distal pulses.   No murmur heard. Pulmonary/Chest: Effort normal and breath sounds normal. No respiratory distress. She has no wheezes.  Abdominal: Soft. Bowel sounds are normal. She exhibits no distension. There is no tenderness.  Musculoskeletal: Normal range of motion. She exhibits no edema or tenderness.  Neurological: She is alert and oriented to person, place, and time.  Skin: Skin is warm and dry.  Psychiatric: She has a normal mood and affect. Her behavior is normal. Judgment and thought content normal.  Vitals reviewed.     BP 111/64   Pulse 63   Temp 97.3 F (36.3 C) (Oral)   Ht 5\' 2"  (1.575 m)   Wt 187 lb (84.8 kg)   BMI 34.20 kg/m      Assessment & Plan:    1. Hematuria, unspecified type - Urinalysis, Complete - Urine culture  2. Acute cystitis with hematuria Force fluids AZO over the counter X2 days RTO prn Culture pending - Urine culture - ciprofloxacin (CIPRO) 500 MG tablet; Take 1 tablet (500 mg total) by mouth 2 (two) times daily.  Dispense: 14 tablet; Refill: 0   Evelina Dun, FNP

## 2016-11-17 LAB — URINE CULTURE

## 2016-11-26 ENCOUNTER — Ambulatory Visit (INDEPENDENT_AMBULATORY_CARE_PROVIDER_SITE_OTHER): Payer: Medicare Other | Admitting: Family

## 2016-11-26 ENCOUNTER — Encounter: Payer: Self-pay | Admitting: Family

## 2016-11-26 VITALS — BP 109/61 | HR 59 | Temp 98.8°F | Ht 62.0 in | Wt 188.0 lb

## 2016-11-26 DIAGNOSIS — Z87440 Personal history of urinary (tract) infections: Secondary | ICD-10-CM | POA: Diagnosis not present

## 2016-11-26 DIAGNOSIS — R399 Unspecified symptoms and signs involving the genitourinary system: Secondary | ICD-10-CM

## 2016-11-26 LAB — URINALYSIS, COMPLETE
Bilirubin, UA: NEGATIVE
Ketones, UA: NEGATIVE
NITRITE UA: NEGATIVE
Protein, UA: NEGATIVE
RBC, UA: NEGATIVE
Specific Gravity, UA: 1.015 (ref 1.005–1.030)
UUROB: 0.2 mg/dL (ref 0.2–1.0)
pH, UA: 6 (ref 5.0–7.5)

## 2016-11-26 LAB — MICROSCOPIC EXAMINATION: BACTERIA UA: NONE SEEN

## 2016-11-26 NOTE — Progress Notes (Signed)
   Subjective:    Patient ID: Alexandria Irwin, female    DOB: 1945/10/14, 71 y.o.   MRN: [id number]  Pt presents to the office today for recurrent UTI. Pt was seen in the office on 09/30/16 and given macrobid and that was changed to cipro on 10/04/16 for worsening symptoms. Pt returned to the office on 11/15/16 for recurrent symptoms and was given cipro again. Her culture grew two different organisms, none predominant. PT reports the hematuria is improved and has mild dysuria.  Urinary Tract Infection   This is a recurrent problem. The current episode started 1 to 4 weeks ago. The problem occurs intermittently. The problem has been gradually improving. The quality of the pain is described as burning. The pain is at a severity of 2/10. The pain is mild. Associated symptoms include frequency and urgency. Pertinent negatives include no discharge, flank pain, hematuria, hesitancy, nausea or vomiting. She has tried antibiotics for the symptoms. The treatment provided mild relief.      Review of Systems  Gastrointestinal: Negative for nausea and vomiting.  Genitourinary: Positive for frequency and urgency. Negative for flank pain, hematuria and hesitancy.  All other systems reviewed and are negative.      Objective:   Physical Exam  Constitutional: She is oriented to person, place, and time. She appears well-developed and well-nourished. No distress.  HENT:  Head: Normocephalic.  Cardiovascular: Normal rate, regular rhythm, normal heart sounds and intact distal pulses.   No murmur heard. Pulmonary/Chest: Effort normal and breath sounds normal. No respiratory distress. She has no wheezes.  Abdominal: Soft. Bowel sounds are normal. She exhibits no distension. There is no tenderness.  Musculoskeletal: Normal range of motion. She exhibits no edema or tenderness.  Neurological: She is alert and oriented to person, place, and time.  Skin: Skin is warm and dry.  Psychiatric: She has a normal mood and  affect. Her behavior is normal. Judgment and thought content normal.  Vitals reviewed.   BP 109/61   Pulse (!) 59   Temp 98.8 F (37.1 C) (Oral)   Ht 5\' 2"  (1.575 m)   Wt 188 lb (85.3 kg)   BMI 34.39 kg/m        Assessment & Plan:  1. Hx: UTI (urinary tract infection) - Urinalysis, Complete - Urine culture  2. UTI symptoms - Urine culture   Urine negative, will do urine culture  Force fluids AZO  As needed RTO prn  Evelina Dun, FNP

## 2016-11-26 NOTE — Patient Instructions (Signed)
Asymptomatic Bacteriuria Asymptomatic bacteriuria is the presence of a large number of bacteria in the urine without the usual symptoms of burning or frequent urination. What are the causes? This condition is caused by an increase in bacteria in the urine. This increase can be caused by:  Bacteria entering the urinary tract, such as during sex.  A blockage in the urinary tract, such as from kidney stones or a tumor.  Bladder problems that prevent the bladder from emptying.  What increases the risk? You are more likely to develop this condition if:  You have diabetes mellitus.  You are an elderly adult, especially if you are also in a long-term care facility.  You are pregnant and in the first trimester.  You have kidney stones.  You are female.  You have had a kidney transplant.  You have a leaky kidney tube valve (reflux).  You had a urinary catheter for a long period of time.  What are the signs or symptoms? There are no symptoms of this condition. How is this diagnosed? This condition is diagnosed with a urine test. Because this condition does not cause symptoms, it is usually diagnosed when a urine sample is taken to treat or diagnose another condition, such as pregnancy or kidney problems. Most women who are in their first trimester of pregnancy are screened for asymptomatic bacteriuria. How is this treated? Usually, treatment is not needed for this condition. Treating the condition can lead to other problems, such as a yeast infection or the growth of bacteria that do not respond to treatment (antibiotic-resistant bacteria). Some people, such as pregnant women and people with kidney transplants, do need treatment with antibiotic medicines to prevent kidney infection (pyelonephritis). In pregnant women, kidney infection can lead to premature labor, fetal growth restriction, or newborn death. Follow these instructions at home: Medicines  Take over-the-counter and  prescription medicines only as told by your health care provider.  If you were prescribed an antibiotic medicine, take it as told by your health care provider. Do not stop taking the antibiotic even if you start to feel better. General instructions  Monitor your condition for any changes.  Drink enough fluid to keep your urine clear or pale yellow.  Go to the bathroom more often to keep your bladder empty.  If you are female, keep the area around your vagina and rectum clean. Wipe yourself from front to back after urinating.  Keep all follow-up visits as told by your health care provider. This is important. Contact a health care provider if:  You notice any new symptoms, such as back pain or burning while urinating. Get help right away if:  You develop signs of an infection such as: ? A burning sensation when you urinate. ? Have pain when you urinate. ? Develop an intense need to urinate. ? Urinating more frequently. ? Back pain or pelvic pain. ? Fever or chills.  You have blood in your urine.  Your urine becomes discolored or cloudy.  Your urine smells bad.  You have severe pain that cannot be controlled with medicine. Summary  Asymptomatic bacteriuria is the presence of a large number of bacteria in the urine without the usual symptoms of burning or frequent urination.  Usually, treatment is not needed for this condition. Treating the condition can lead to other problems, such as too much yeast and the growth of antibiotic-resistant bacteria.  Some people, such as pregnant women and people with kidney transplants, do need treatment with antibiotic medicines to prevent   kidney infection (pyelonephritis).  If you were prescribed an antibiotic medicine, take it as told by your health care provider. Do not stop taking the antibiotic even if you start to feel better. This information is not intended to replace advice given to you by your health care provider. Make sure you  discuss any questions you have with your health care provider. Document Released: 06/17/2005 Document Revised: 06/11/2016 Document Reviewed: 06/11/2016 Elsevier Interactive Patient Education  2017 Elsevier Inc.  

## 2016-11-28 ENCOUNTER — Telehealth: Payer: Self-pay | Admitting: Family

## 2016-11-29 NOTE — Telephone Encounter (Signed)
Denied.

## 2016-11-30 LAB — URINE CULTURE

## 2016-12-02 ENCOUNTER — Other Ambulatory Visit: Payer: Self-pay | Admitting: Family

## 2016-12-02 MED ORDER — NITROFURANTOIN MONOHYD MACRO 100 MG PO CAPS: 100.0000 mg | ORAL_CAPSULE | Freq: Two times a day (BID) | ORAL | 0 refills | Status: DC

## 2016-12-19 ENCOUNTER — Telehealth: Payer: Self-pay | Admitting: Family

## 2016-12-19 ENCOUNTER — Other Ambulatory Visit: Payer: Self-pay

## 2016-12-19 DIAGNOSIS — N390 Urinary tract infection, site not specified: Secondary | ICD-10-CM

## 2016-12-19 MED ORDER — NITROFURANTOIN MONOHYD MACRO 100 MG PO CAPS: 100.0000 mg | ORAL_CAPSULE | Freq: Two times a day (BID) | ORAL | 0 refills | Status: DC

## 2016-12-19 NOTE — Telephone Encounter (Signed)
Macrobid Prescription sent to pharmacy, Referral to Urologists for recurrent UTI's

## 2016-12-19 NOTE — Telephone Encounter (Signed)
Spoke with patient and she states that she was being treated for a UTI and finished her ABT about 10 days ago. Yesterday when she wiped she has a smear on blood on her toilet paper and when she woke up this morning she was wearing a panty liner and it was full of blood. Patient has had some abdominal pain over the last few days. She states that she has been having UTIs since this past November and has been treated for them every few weeks. She states that she recently went to her gynecologist for the vaginal bleeding and he did a pap which was normal and also a vaginal ultrasound which was normal. He said he was unsure of where the blood was coming from and there were other tests that he could do but he didn't think they were necessary. Patient wants to know what you suggest? Should she see a urologist or would you like her to come in and be seen to discuss? Please advise and route to pool A

## 2016-12-19 NOTE — Telephone Encounter (Signed)
Patient notified that rx sent to the pharmacy. Patient states that she would like rx sent to [facility]. Cancelled rx at [facility] and resent to [facility]. Advised patient we would be in touch with her about the referral

## 2016-12-23 ENCOUNTER — Other Ambulatory Visit: Payer: Self-pay

## 2016-12-23 NOTE — Telephone Encounter (Signed)
Last lipid 01/09/16

## 2016-12-24 MED ORDER — SIMVASTATIN 40 MG PO TABS: ORAL_TABLET | ORAL | 0 refills | Status: DC

## 2016-12-30 DIAGNOSIS — H04123 Dry eye syndrome of bilateral lacrimal glands: Secondary | ICD-10-CM | POA: Diagnosis not present

## 2016-12-30 DIAGNOSIS — H40033 Anatomical narrow angle, bilateral: Secondary | ICD-10-CM | POA: Diagnosis not present

## 2017-01-06 ENCOUNTER — Other Ambulatory Visit: Payer: Self-pay | Admitting: Family

## 2017-01-06 ENCOUNTER — Other Ambulatory Visit: Payer: Self-pay | Admitting: Family Medicine

## 2017-01-06 NOTE — Telephone Encounter (Signed)
Last thyroid 01/09/16

## 2017-01-12 ENCOUNTER — Other Ambulatory Visit: Payer: Self-pay | Admitting: Family

## 2017-01-14 DIAGNOSIS — Z961 Presence of intraocular lens: Secondary | ICD-10-CM | POA: Diagnosis not present

## 2017-01-14 DIAGNOSIS — H26491 Other secondary cataract, right eye: Secondary | ICD-10-CM | POA: Diagnosis not present

## 2017-01-14 DIAGNOSIS — H25812 Combined forms of age-related cataract, left eye: Secondary | ICD-10-CM | POA: Diagnosis not present

## 2017-01-14 DIAGNOSIS — E119 Type 2 diabetes mellitus without complications: Secondary | ICD-10-CM | POA: Diagnosis not present

## 2017-01-20 ENCOUNTER — Telehealth: Payer: Self-pay | Admitting: Family

## 2017-01-20 ENCOUNTER — Encounter: Payer: Self-pay | Admitting: Family

## 2017-01-20 ENCOUNTER — Ambulatory Visit (INDEPENDENT_AMBULATORY_CARE_PROVIDER_SITE_OTHER): Payer: Medicare Other | Admitting: Family

## 2017-01-20 VITALS — BP 125/67 | HR 54 | Temp 97.6°F | Ht 62.0 in | Wt 183.6 lb

## 2017-01-20 DIAGNOSIS — Z1211 Encounter for screening for malignant neoplasm of colon: Secondary | ICD-10-CM | POA: Diagnosis not present

## 2017-01-20 DIAGNOSIS — E039 Hypothyroidism, unspecified: Secondary | ICD-10-CM | POA: Diagnosis not present

## 2017-01-20 DIAGNOSIS — Z1159 Encounter for screening for other viral diseases: Secondary | ICD-10-CM

## 2017-01-20 DIAGNOSIS — E119 Type 2 diabetes mellitus without complications: Secondary | ICD-10-CM | POA: Diagnosis not present

## 2017-01-20 DIAGNOSIS — E785 Hyperlipidemia, unspecified: Secondary | ICD-10-CM

## 2017-01-20 DIAGNOSIS — E559 Vitamin D deficiency, unspecified: Secondary | ICD-10-CM

## 2017-01-20 DIAGNOSIS — I10 Essential (primary) hypertension: Secondary | ICD-10-CM

## 2017-01-20 DIAGNOSIS — N3001 Acute cystitis with hematuria: Secondary | ICD-10-CM | POA: Diagnosis not present

## 2017-01-20 DIAGNOSIS — R319 Hematuria, unspecified: Secondary | ICD-10-CM | POA: Diagnosis not present

## 2017-01-20 LAB — MICROSCOPIC EXAMINATION: Renal Epithel, UA: >10 /hpf — AB

## 2017-01-20 LAB — URINALYSIS, COMPLETE
BILIRUBIN UA: NEGATIVE
Glucose, UA: NEGATIVE
Ketones, UA: NEGATIVE
Nitrite, UA: NEGATIVE
PH UA: 5.5 (ref 5.0–7.5)
Specific Gravity, UA: 1.01 (ref 1.005–1.030)
UUROB: 0.2 mg/dL (ref 0.2–1.0)

## 2017-01-20 LAB — BAYER DCA HB A1C WAIVED: HB A1C: 9.2 % — AB (ref <7.0)

## 2017-01-20 MED ORDER — CIPROFLOXACIN HCL 500 MG PO TABS: 500.0000 mg | ORAL_TABLET | Freq: Two times a day (BID) | ORAL | 0 refills | Status: DC

## 2017-01-20 NOTE — Progress Notes (Signed)
Subjective:    Patient ID: Ardith Test, female    DOB: 1946/01/03, 71 y.o.   MRN: [id number]  Pt presents to the office today for chronic follow up. Pt also complaining of hematuria and has a new appt with Urologists 08/29. She is scheduled for cataract surgery 08/016.  Diabetes  She presents for her follow-up diabetic visit. She has type 2 diabetes mellitus. Her disease course has been stable. There are no hypoglycemic associated symptoms. Associated symptoms include blurred vision. Pertinent negatives for diabetes include no foot paresthesias and no visual change. There are no hypoglycemic complications. Symptoms are stable. Pertinent negatives for diabetic complications include no CVA, heart disease, nephropathy or peripheral neuropathy. She is following a diabetic diet. Her breakfast blood glucose range is generally 180-200 mg/dl. Eye exam is current.  Hematuria  This is a recurrent problem. The current episode started more than 1 month ago. The problem has been waxing and waning since onset. Irritative symptoms include frequency and urgency. Associated symptoms include abdominal pain, dysuria and flank pain. Her past medical history is significant for hypertension.  Thyroid Problem  Presents for follow-up visit. Symptoms include dry skin. Patient reports no constipation, diarrhea or visual change. The symptoms have been stable. Her past medical history is significant for hyperlipidemia. There is no history of heart failure.  Hypertension  This is a chronic problem. The current episode started more than 1 year ago. The problem has been resolved since onset. The problem is controlled. Associated symptoms include blurred vision, malaise/fatigue and peripheral edema ("at times"). Pertinent negatives include no shortness of breath. There is no history of kidney disease, CAD/MI, CVA or heart failure. Identifiable causes of hypertension include a thyroid problem.  Hyperlipidemia  This is a chronic  problem. The current episode started more than 1 year ago. Recent lipid tests were reviewed and are normal. Exacerbating diseases include obesity. Pertinent negatives include no shortness of breath. Current antihyperlipidemic treatment includes statins. The current treatment provides moderate improvement of lipids.      Review of Systems  Constitutional: Positive for malaise/fatigue.  Eyes: Positive for blurred vision.  Respiratory: Negative for shortness of breath.   Gastrointestinal: Positive for abdominal pain. Negative for constipation and diarrhea.  Genitourinary: Positive for dysuria, flank pain, frequency, hematuria and urgency.  All other systems reviewed and are negative.      Objective:   Physical Exam  Constitutional: She is oriented to person, place, and time. She appears well-developed and well-nourished. No distress.  HENT:  Head: Normocephalic and atraumatic.  Right Ear: External ear normal.  Left Ear: External ear normal.  Nose: Nose normal.  Mouth/Throat: Oropharynx is clear and moist.  Eyes: Pupils are equal, round, and reactive to light.  Neck: Normal range of motion. Neck supple. No thyromegaly present.  Cardiovascular: Normal rate, regular rhythm, normal heart sounds and intact distal pulses.   No murmur heard. Pulmonary/Chest: Effort normal and breath sounds normal. No respiratory distress. She has no wheezes.  Abdominal: Soft. Bowel sounds are normal. She exhibits no distension. There is tenderness (mild lower abd ).  Musculoskeletal: Normal range of motion. She exhibits no edema or tenderness.  Neurological: She is alert and oriented to person, place, and time.  Skin: Skin is warm and dry.  Psychiatric: She has a normal mood and affect. Her behavior is normal. Judgment and thought content normal.  Vitals reviewed.   Diabetic Foot Exam - Simple   Simple Foot Form Diabetic Foot exam was performed with the following  findings:  Yes 01/20/2017  9:02 AM    Visual Inspection No deformities, no ulcerations, no other skin breakdown bilaterally:  Yes Sensation Testing Intact to touch and monofilament testing bilaterally:  Yes Pulse Check Posterior Tibialis and Dorsalis pulse intact bilaterally:  Yes Comments      Blood pressure 125/67, pulse (!) 54, temperature 97.6 F (36.4 C), temperature source Oral, height '5\' 2"'  (1.575 m), weight 183 lb 9.6 oz (83.3 kg).     Assessment & Plan:  1. Hematuria, unspecified type - Urine Culture - Urinalysis, Complete - CMP14+EGFR - CBC with Differential/Platelet  2. Essential hypertension - CMP14+EGFR - CBC with Differential/Platelet  3. Hypothyroidism, unspecified type - CMP14+EGFR - CBC with Differential/Platelet - TSH  4. Type 2 diabetes mellitus without complication, without long-term current use of insulin (HCC) - Bayer DCA Hb A1c Waived - CMP14+EGFR - CBC with Differential/Platelet - Microalbumin / creatinine urine ratio  5. Hyperlipidemia, unspecified hyperlipidemia type - CMP14+EGFR - Lipid panel - CBC with Differential/Platelet  6. Vitamin D deficiency - CMP14+EGFR - CBC with Differential/Platelet  7. Need for hepatitis C screening test - Hepatitis C antibody  8. Colon cancer screening - Fecal occult blood, imunochemical; Future  9. Acute cystitis with hematuria Force fluids AZO over the counter X2 days RTO prn Culture pending - ciprofloxacin (CIPRO) 500 MG tablet; Take 1 tablet (500 mg total) by mouth 2 (two) times daily.  Dispense: 14 tablet; Refill: 0   Continue all meds Labs pending Health Maintenance reviewed- Pt to schedule mammogram  Diet and exercise encouraged RTO 3 months and keep Urologists appt  Evelina Dun, FNP

## 2017-01-20 NOTE — Telephone Encounter (Signed)
Pharm changed and pt aware

## 2017-01-20 NOTE — Patient Instructions (Signed)

## 2017-01-21 ENCOUNTER — Other Ambulatory Visit: Payer: Self-pay | Admitting: Family

## 2017-01-21 LAB — CBC WITH DIFFERENTIAL/PLATELET
BASOS ABS: 0 10*3/uL (ref 0.0–0.2)
BASOS: 0 %
EOS (ABSOLUTE): 0.3 10*3/uL (ref 0.0–0.4)
EOS: 5 %
HEMATOCRIT: 39.2 % (ref 34.0–46.6)
HEMOGLOBIN: 12.7 g/dL (ref 11.1–15.9)
IMMATURE GRANS (ABS): 0 10*3/uL (ref 0.0–0.1)
Immature Granulocytes: 0 %
LYMPHS: 34 %
Lymphocytes Absolute: 2 10*3/uL (ref 0.7–3.1)
MCH: 29.8 pg (ref 26.6–33.0)
MCHC: 32.4 g/dL (ref 31.5–35.7)
MCV: 92 fL (ref 79–97)
MONOCYTES: 7 %
Monocytes Absolute: 0.4 10*3/uL (ref 0.1–0.9)
NEUTROS ABS: 3.3 10*3/uL (ref 1.4–7.0)
Neutrophils: 54 %
Platelets: 211 10*3/uL (ref 150–379)
RBC: 4.26 x10E6/uL (ref 3.77–5.28)
RDW: 13.9 % (ref 12.3–15.4)
WBC: 6 10*3/uL (ref 3.4–10.8)

## 2017-01-21 LAB — CMP14+EGFR
A/G RATIO: 1.7 (ref 1.2–2.2)
ALK PHOS: 38 IU/L — AB (ref 39–117)
ALT: 26 IU/L (ref 0–32)
AST: 30 IU/L (ref 0–40)
Albumin: 4.1 g/dL (ref 3.5–4.8)
BUN/Creatinine Ratio: 15 (ref 12–28)
BUN: 13 mg/dL (ref 8–27)
Bilirubin Total: 0.6 mg/dL (ref 0.0–1.2)
CALCIUM: 9.6 mg/dL (ref 8.7–10.3)
CHLORIDE: 98 mmol/L (ref 96–106)
CO2: 25 mmol/L (ref 20–29)
Creatinine, Ser: 0.86 mg/dL (ref 0.57–1.00)
GFR calc Af Amer: 78 mL/min/{1.73_m2} (ref >59)
GFR calc non Af Amer: 67 mL/min/{1.73_m2} (ref >59)
GLOBULIN, TOTAL: 2.4 g/dL (ref 1.5–4.5)
Glucose: 196 mg/dL — ABNORMAL HIGH (ref 65–99)
POTASSIUM: 5 mmol/L (ref 3.5–5.2)
SODIUM: 140 mmol/L (ref 134–144)
Total Protein: 6.5 g/dL (ref 6.0–8.5)

## 2017-01-21 LAB — LIPID PANEL
CHOLESTEROL TOTAL: 117 mg/dL (ref 100–199)
Chol/HDL Ratio: 3.1 ratio (ref 0.0–4.4)
HDL: 38 mg/dL — AB (ref >39)
LDL Calculated: 49 mg/dL (ref 0–99)
TRIGLYCERIDES: 150 mg/dL — AB (ref 0–149)
VLDL Cholesterol Cal: 30 mg/dL (ref 5–40)

## 2017-01-21 LAB — TSH: TSH: 2.5 u[IU]/mL (ref 0.450–4.500)

## 2017-01-21 LAB — HEPATITIS C ANTIBODY: Hep C Virus Ab: <0.1 s/co ratio (ref 0.0–0.9)

## 2017-01-22 ENCOUNTER — Other Ambulatory Visit: Payer: Medicare Other

## 2017-01-22 DIAGNOSIS — Z1211 Encounter for screening for malignant neoplasm of colon: Secondary | ICD-10-CM

## 2017-01-22 LAB — MICROALBUMIN / CREATININE URINE RATIO
Creatinine, Urine: 128.2 mg/dL
MICROALBUM., U, RANDOM: 228.9 ug/mL
Microalb/Creat Ratio: 178.5 mg/g creat — ABNORMAL HIGH (ref 0.0–30.0)

## 2017-01-22 LAB — SPECIMEN STATUS REPORT

## 2017-01-23 ENCOUNTER — Telehealth: Payer: Self-pay | Admitting: Family

## 2017-01-23 ENCOUNTER — Other Ambulatory Visit: Payer: Self-pay | Admitting: Family

## 2017-01-23 MED ORDER — SITAGLIPTIN PHOSPHATE 50 MG PO TABS: 50.0000 mg | ORAL_TABLET | Freq: Every day | ORAL | 1 refills | Status: DC

## 2017-01-23 NOTE — Telephone Encounter (Signed)
Sent [facility]ia to [facility] of [facility] from list of pharmacy's

## 2017-01-24 LAB — URINE CULTURE

## 2017-01-25 LAB — FECAL OCCULT BLOOD, IMMUNOCHEMICAL: FECAL OCCULT BLD: NEGATIVE

## 2017-01-26 ENCOUNTER — Other Ambulatory Visit: Payer: Self-pay | Admitting: Family Medicine

## 2017-02-10 ENCOUNTER — Telehealth: Payer: Self-pay | Admitting: Family

## 2017-02-10 DIAGNOSIS — N3001 Acute cystitis with hematuria: Secondary | ICD-10-CM

## 2017-02-10 MED ORDER — CIPROFLOXACIN HCL 500 MG PO TABS: 500.0000 mg | ORAL_TABLET | Freq: Two times a day (BID) | ORAL | 0 refills | Status: DC

## 2017-02-10 NOTE — Telephone Encounter (Signed)
Pt aware of [facility] note

## 2017-02-10 NOTE — Telephone Encounter (Signed)
Pt called and aware

## 2017-02-10 NOTE — Telephone Encounter (Signed)
Cipro Prescription sent to pharmacy. Pt needs to keep Urologists appt!!

## 2017-02-10 NOTE — Telephone Encounter (Signed)
Left message for patient to call.

## 2017-02-13 DIAGNOSIS — H25812 Combined forms of age-related cataract, left eye: Secondary | ICD-10-CM | POA: Diagnosis not present

## 2017-02-13 DIAGNOSIS — Z961 Presence of intraocular lens: Secondary | ICD-10-CM | POA: Diagnosis not present

## 2017-02-13 DIAGNOSIS — H2512 Age-related nuclear cataract, left eye: Secondary | ICD-10-CM | POA: Diagnosis not present

## 2017-02-26 ENCOUNTER — Ambulatory Visit (INDEPENDENT_AMBULATORY_CARE_PROVIDER_SITE_OTHER): Payer: Medicare Other | Admitting: Urology

## 2017-02-26 DIAGNOSIS — N3021 Other chronic cystitis with hematuria: Secondary | ICD-10-CM

## 2017-02-28 ENCOUNTER — Other Ambulatory Visit: Payer: Self-pay | Admitting: Urology

## 2017-02-28 DIAGNOSIS — N3021 Other chronic cystitis with hematuria: Secondary | ICD-10-CM

## 2017-03-05 ENCOUNTER — Other Ambulatory Visit: Payer: Self-pay | Admitting: Family

## 2017-03-06 ENCOUNTER — Other Ambulatory Visit: Payer: Self-pay | Admitting: *Deleted

## 2017-03-06 LAB — HM DIABETES EYE EXAM

## 2017-03-06 MED ORDER — SIMVASTATIN 40 MG PO TABS: ORAL_TABLET | ORAL | 1 refills | Status: DC

## 2017-03-06 MED ORDER — METFORMIN HCL 500 MG PO TABS: ORAL_TABLET | ORAL | 1 refills | Status: DC

## 2017-03-13 ENCOUNTER — Ambulatory Visit (HOSPITAL_COMMUNITY)
Admission: RE | Admit: 2017-03-13 | Discharge: 2017-03-13 | Disposition: A | Discharge status: Home / Self Care | Payer: Medicare Other | Source: Ambulatory Visit | Attending: Urology | Admitting: Urology

## 2017-03-13 ENCOUNTER — Encounter (HOSPITAL_COMMUNITY): Payer: Self-pay

## 2017-03-13 DIAGNOSIS — Z9049 Acquired absence of other specified parts of digestive tract: Secondary | ICD-10-CM | POA: Diagnosis not present

## 2017-03-13 DIAGNOSIS — N3021 Other chronic cystitis with hematuria: Secondary | ICD-10-CM | POA: Insufficient documentation

## 2017-03-13 DIAGNOSIS — K439 Ventral hernia without obstruction or gangrene: Secondary | ICD-10-CM | POA: Diagnosis not present

## 2017-03-13 DIAGNOSIS — K429 Umbilical hernia without obstruction or gangrene: Secondary | ICD-10-CM | POA: Diagnosis not present

## 2017-03-13 DIAGNOSIS — I700 Atherosclerosis of aorta: Secondary | ICD-10-CM | POA: Diagnosis not present

## 2017-03-13 DIAGNOSIS — K5730 Diverticulosis of large intestine without perforation or abscess without bleeding: Secondary | ICD-10-CM | POA: Diagnosis not present

## 2017-03-13 MED ORDER — IOPAMIDOL (ISOVUE-300) INJECTION 61%
125.0000 mL | Freq: Once | INTRAVENOUS | Status: AC | PRN
  Administered 2017-03-13: 125 mL via INTRAVENOUS

## 2017-04-01 ENCOUNTER — Other Ambulatory Visit: Payer: Self-pay | Admitting: *Deleted

## 2017-04-01 MED ORDER — SIMVASTATIN 40 MG PO TABS: ORAL_TABLET | ORAL | 1 refills | Status: DC

## 2017-04-07 ENCOUNTER — Other Ambulatory Visit: Payer: Self-pay | Admitting: Family

## 2017-04-16 ENCOUNTER — Ambulatory Visit (INDEPENDENT_AMBULATORY_CARE_PROVIDER_SITE_OTHER): Payer: Medicare Other | Admitting: Urology

## 2017-04-16 DIAGNOSIS — N3021 Other chronic cystitis with hematuria: Secondary | ICD-10-CM

## 2017-04-22 DIAGNOSIS — Z23 Encounter for immunization: Secondary | ICD-10-CM | POA: Diagnosis not present

## 2017-04-30 DIAGNOSIS — N950 Postmenopausal bleeding: Secondary | ICD-10-CM | POA: Diagnosis not present

## 2017-05-26 ENCOUNTER — Ambulatory Visit (INDEPENDENT_AMBULATORY_CARE_PROVIDER_SITE_OTHER): Payer: Medicare Other | Admitting: Family

## 2017-05-26 ENCOUNTER — Encounter: Payer: Self-pay | Admitting: Family

## 2017-05-26 VITALS — BP 131/77 | HR 69 | Temp 97.8°F | Ht 62.0 in | Wt 178.4 lb

## 2017-05-26 DIAGNOSIS — N939 Abnormal uterine and vaginal bleeding, unspecified: Secondary | ICD-10-CM

## 2017-05-26 DIAGNOSIS — E785 Hyperlipidemia, unspecified: Secondary | ICD-10-CM

## 2017-05-26 DIAGNOSIS — E119 Type 2 diabetes mellitus without complications: Secondary | ICD-10-CM

## 2017-05-26 DIAGNOSIS — E039 Hypothyroidism, unspecified: Secondary | ICD-10-CM | POA: Diagnosis not present

## 2017-05-26 DIAGNOSIS — E559 Vitamin D deficiency, unspecified: Secondary | ICD-10-CM

## 2017-05-26 DIAGNOSIS — E1159 Type 2 diabetes mellitus with other circulatory complications: Secondary | ICD-10-CM | POA: Diagnosis not present

## 2017-05-26 DIAGNOSIS — I152 Hypertension secondary to endocrine disorders: Secondary | ICD-10-CM

## 2017-05-26 DIAGNOSIS — E538 Deficiency of other specified B group vitamins: Secondary | ICD-10-CM | POA: Diagnosis not present

## 2017-05-26 DIAGNOSIS — I10 Essential (primary) hypertension: Secondary | ICD-10-CM | POA: Diagnosis not present

## 2017-05-26 DIAGNOSIS — E1169 Type 2 diabetes mellitus with other specified complication: Secondary | ICD-10-CM | POA: Diagnosis not present

## 2017-05-26 LAB — BAYER DCA HB A1C WAIVED: HB A1C: 7.9 % — AB (ref <7.0)

## 2017-05-26 NOTE — Patient Instructions (Signed)
Diabetes Mellitus and Food It is important for you to manage your blood sugar (glucose) level. Your blood glucose level can be greatly affected by what you eat. Eating healthier foods in the appropriate amounts throughout the day at about the same time each day will help you control your blood glucose level. It can also help slow or prevent worsening of your diabetes mellitus. Healthy eating may even help you improve the level of your blood pressure and reach or maintain a healthy weight. General recommendations for healthful eating and cooking habits include:  Eating meals and snacks regularly. Avoid going long periods of time without eating to lose weight.  Eating a diet that consists mainly of plant-based foods, such as fruits, vegetables, nuts, legumes, and whole grains.  Using low-heat cooking methods, such as baking, instead of high-heat cooking methods, such as deep frying.  Work with your dietitian to make sure you understand how to use the Nutrition Facts information on food labels. How can food affect me? Carbohydrates Carbohydrates affect your blood glucose level more than any other type of food. Your dietitian will help you determine how many carbohydrates to eat at each meal and teach you how to count carbohydrates. Counting carbohydrates is important to keep your blood glucose at a healthy level, especially if you are using insulin or taking certain medicines for diabetes mellitus. Alcohol Alcohol can cause sudden decreases in blood glucose (hypoglycemia), especially if you use insulin or take certain medicines for diabetes mellitus. Hypoglycemia can be a life-threatening condition. Symptoms of hypoglycemia (sleepiness, dizziness, and disorientation) are similar to symptoms of having too much alcohol. If your health care provider has given you approval to drink alcohol, do so in moderation and use the following guidelines:  Women should not have more than one drink per day, and men  should not have more than two drinks per day. One drink is equal to: ? 12 oz of beer. ? 5 oz of wine. ? 1 oz of hard liquor.  Do not drink on an empty stomach.  Keep yourself hydrated. Have water, diet soda, or unsweetened iced tea.  Regular soda, juice, and other mixers might contain a lot of carbohydrates and should be counted.  What foods are not recommended? As you make food choices, it is important to remember that all foods are not the same. Some foods have fewer nutrients per serving than other foods, even though they might have the same number of calories or carbohydrates. It is difficult to get your body what it needs when you eat foods with fewer nutrients. Examples of foods that you should avoid that are high in calories and carbohydrates but low in nutrients include:  Trans fats (most processed foods list trans fats on the Nutrition Facts label).  Regular soda.  Juice.  Candy.  Sweets, such as cake, pie, doughnuts, and cookies.  Fried foods.  What foods can I eat? Eat nutrient-rich foods, which will nourish your body and keep you healthy. The food you should eat also will depend on several factors, including:  The calories you need.  The medicines you take.  Your weight.  Your blood glucose level.  Your blood pressure level.  Your cholesterol level.  You should eat a variety of foods, including:  Protein. ? Lean cuts of meat. ? Proteins low in saturated fats, such as fish, egg whites, and beans. Avoid processed meats.  Fruits and vegetables. ? Fruits and vegetables that may help control blood glucose levels, such as apples,   mangoes, and yams.  Dairy products. ? Choose fat-free or low-fat dairy products, such as milk, yogurt, and cheese.  Grains, bread, pasta, and rice. ? Choose whole grain products, such as multigrain bread, whole oats, and brown rice. These foods may help control blood pressure.  Fats. ? Foods containing healthful fats, such as  nuts, avocado, olive oil, canola oil, and fish.  Does everyone with diabetes mellitus have the same meal plan? Because every person with diabetes mellitus is different, there is not one meal plan that works for everyone. It is very important that you meet with a dietitian who will help you create a meal plan that is just right for you. This information is not intended to replace advice given to you by your health care provider. Make sure you discuss any questions you have with your health care provider. Document Released: 03/14/2005 Document Revised: 11/23/2015 Document Reviewed: 05/14/2013 Elsevier Interactive Patient Education  2017 Elsevier Inc.  

## 2017-05-26 NOTE — Progress Notes (Signed)
Subjective:    Patient ID: Alexandria Irwin, female    DOB: July 04, 1945, 71 y.o.   MRN: [id number]  Pt presents to the office today for chronic follow up. Pt is scheduled for hysteroscopy on 06/13/17 for vaginal bleeding.  Diabetes  She presents for her follow-up diabetic visit. She has type 2 diabetes mellitus. Her disease course has been stable. There are no hypoglycemic associated symptoms. Associated symptoms include fatigue. Pertinent negatives for diabetes include no blurred vision, no foot paresthesias and no visual change. There are no hypoglycemic complications. Symptoms are stable. There are no diabetic complications. Pertinent negatives for diabetic complications include no CVA, heart disease or nephropathy. Risk factors for coronary artery disease include diabetes mellitus, dyslipidemia, hypertension and sedentary lifestyle. She is following a generally healthy diet. An ACE inhibitor/angiotensin II receptor blocker is being taken.  Hypertension  This is a chronic problem. The current episode started more than 1 year ago. The problem has been resolved since onset. The problem is controlled. Associated symptoms include malaise/fatigue and peripheral edema ("some"). Pertinent negatives include no blurred vision or shortness of breath. Risk factors for coronary artery disease include diabetes mellitus, dyslipidemia, obesity, post-menopausal state and sedentary lifestyle. Past treatments include angiotensin blockers. The current treatment provides moderate improvement. There is no history of CAD/MI or CVA. Identifiable causes of hypertension include a thyroid problem.  Hyperlipidemia  This is a chronic problem. The current episode started more than 1 year ago. The problem is controlled. Recent lipid tests were reviewed and are normal. Pertinent negatives include no shortness of breath. Current antihyperlipidemic treatment includes statins. The current treatment provides moderate improvement of lipids.    Thyroid Problem  Presents for follow-up visit. Symptoms include fatigue, leg swelling ("some") and menstrual problem. Patient reports no constipation, diarrhea or visual change. The symptoms have been stable. Her past medical history is significant for hyperlipidemia.      Review of Systems  Constitutional: Positive for fatigue and malaise/fatigue.  Eyes: Negative for blurred vision.  Respiratory: Negative for shortness of breath.   Gastrointestinal: Negative for constipation and diarrhea.  Genitourinary: Positive for menstrual problem.  All other systems reviewed and are negative.      Objective:   Physical Exam  Constitutional: She is oriented to person, place, and time. She appears well-developed and well-nourished. No distress.  HENT:  Head: Normocephalic and atraumatic.  Right Ear: External ear normal.  Left Ear: External ear normal.  Nose: Nose normal.  Mouth/Throat: Oropharynx is clear and moist.  Eyes: Pupils are equal, round, and reactive to light.  Neck: Normal range of motion. Neck supple. No thyromegaly present.  Cardiovascular: Normal rate, regular rhythm, normal heart sounds and intact distal pulses.  No murmur heard. Pulmonary/Chest: Effort normal and breath sounds normal. No respiratory distress. She has no wheezes.  Abdominal: Soft. Bowel sounds are normal. She exhibits no distension. There is no tenderness.  Musculoskeletal: Normal range of motion. She exhibits no edema or tenderness.  Neurological: She is alert and oriented to person, place, and time.  Skin: Skin is warm and dry.  Psychiatric: She has a normal mood and affect. Her behavior is normal. Judgment and thought content normal.  Vitals reviewed.     BP 131/77   Pulse 69   Temp 97.8 F (36.6 C) (Oral)   Ht _0  (1.575 m)   Wt 178 lb 6.4 oz (80.9 kg)   BMI 32.63 kg/m      Assessment & Plan:  1. Hypertension associated  with diabetes ([facility]) - CMP14+EGFR  2. Type 2 diabetes mellitus  without complication, without long-term current use of insulin (HCC) - CMP14+EGFR - Bayer DCA Hb A1c Waived  3. Hypothyroidism, unspecified type - CMP14+EGFR - TSH  4. Hyperlipidemia associated with type 2 diabetes mellitus (HCC) - CMP14+EGFR - Lipid panel  5. Vitamin D deficiency - CMP14+EGFR  6. Vagina bleeding - Anemia Profile B - CMP14+EGFR   Continue all meds Labs pending Health Maintenance reviewed Diet and exercise encouraged RTO 3 months   Evelina Dun, FNP

## 2017-05-27 ENCOUNTER — Other Ambulatory Visit: Payer: Self-pay | Admitting: Family

## 2017-05-27 LAB — ANEMIA PROFILE B
Basophils Absolute: 0 x10E3/uL (ref 0.0–0.2)
Basos: 0 %
EOS (ABSOLUTE): 0.2 x10E3/uL (ref 0.0–0.4)
Eos: 2 %
Ferritin: 10 ng/mL — ABNORMAL LOW (ref 15–150)
Folate: >20 ng/mL (ref >3.0)
Hematocrit: 36.8 % (ref 34.0–46.6)
Hemoglobin: 12.4 g/dL (ref 11.1–15.9)
Immature Grans (Abs): 0 x10E3/uL (ref 0.0–0.1)
Immature Granulocytes: 0 %
Iron Saturation: 17 % (ref 15–55)
Iron: 59 ug/dL (ref 27–139)
Lymphocytes Absolute: 1.9 x10E3/uL (ref 0.7–3.1)
Lymphs: 26 %
MCH: 31 pg (ref 26.6–33.0)
MCHC: 33.7 g/dL (ref 31.5–35.7)
MCV: 92 fL (ref 79–97)
Monocytes Absolute: 0.4 x10E3/uL (ref 0.1–0.9)
Monocytes: 6 %
Neutrophils Absolute: 4.9 x10E3/uL (ref 1.4–7.0)
Neutrophils: 66 %
Platelets: 209 x10E3/uL (ref 150–379)
RBC: 4 x10E6/uL (ref 3.77–5.28)
RDW: 13.9 % (ref 12.3–15.4)
Retic Ct Pct: 1.5 % (ref 0.6–2.6)
Total Iron Binding Capacity: 346 ug/dL (ref 250–450)
UIBC: 287 ug/dL (ref 118–369)
Vitamin B-12: 187 pg/mL — ABNORMAL LOW (ref 232–1245)
WBC: 7.4 x10E3/uL (ref 3.4–10.8)

## 2017-05-27 LAB — CMP14+EGFR
ALT: 15 IU/L (ref 0–32)
AST: 17 IU/L (ref 0–40)
Albumin/Globulin Ratio: 1.8 (ref 1.2–2.2)
Albumin: 4.2 g/dL (ref 3.5–4.8)
Alkaline Phosphatase: 36 IU/L — ABNORMAL LOW (ref 39–117)
BUN/Creatinine Ratio: 18 (ref 12–28)
BUN: 13 mg/dL (ref 8–27)
Bilirubin Total: 0.5 mg/dL (ref 0.0–1.2)
CO2: 27 mmol/L (ref 20–29)
Calcium: 9.1 mg/dL (ref 8.7–10.3)
Chloride: 102 mmol/L (ref 96–106)
Creatinine, Ser: 0.72 mg/dL (ref 0.57–1.00)
GFR calc Af Amer: 96 mL/min/1.73 (ref >59)
GFR calc non Af Amer: 83 mL/min/1.73 (ref >59)
Globulin, Total: 2.3 g/dL (ref 1.5–4.5)
Glucose: 161 mg/dL — ABNORMAL HIGH (ref 65–99)
Potassium: 4.6 mmol/L (ref 3.5–5.2)
Sodium: 142 mmol/L (ref 134–144)
Total Protein: 6.5 g/dL (ref 6.0–8.5)

## 2017-05-27 LAB — LIPID PANEL
CHOL/HDL RATIO: 2.8 ratio (ref 0.0–4.4)
CHOLESTEROL TOTAL: 107 mg/dL (ref 100–199)
HDL: 38 mg/dL — ABNORMAL LOW (ref >39)
LDL CALC: 42 mg/dL (ref 0–99)
Triglycerides: 137 mg/dL (ref 0–149)
VLDL CHOLESTEROL CAL: 27 mg/dL (ref 5–40)

## 2017-05-27 LAB — TSH: TSH: 1.82 u[IU]/mL (ref 0.450–4.500)

## 2017-06-03 ENCOUNTER — Other Ambulatory Visit: Payer: Self-pay | Admitting: Family

## 2017-06-03 NOTE — Patient Instructions (Signed)
Your procedure is scheduled on:  Friday, Dec. 14, 2018  Enter through the [employer] [facility] at:  8:15 AM  Pick up the phone at the desk and dial [phone].  Call this number if you have problems the morning of surgery: [phone].  Remember: Do NOT eat food or drink after:  Midnight Thursday  Take these medicines the morning of surgery with a SIP OF WATER:  Clonidine, Levothyroxine, Metoprolol  Use inhalers per normal routine and bring inhaler day of surgery  Do NOT take the evening dose of Metformin the night before surgery  DO NOT TAKE ANY DIABETIC MEDICATIONS THE MORNING OF SURGERY  Stop ALL herbal medications at this time  Do NOT smoke the day of surgery.  Do NOT wear jewelry (body piercing), metal hair clips/bobby pins, make-up, artifical eyelashes or nail polish. Do NOT wear lotions, powders, or perfumes.  You may wear deodorant. Do NOT shave for 48 hours prior to surgery. Do NOT bring valuables to the hospital. Contacts, dentures, or bridgework may not be worn into surgery.  Leave suitcase in car.  After surgery it may be brought to your room.  For patients admitted to the hospital, checkout time is 11:00 AM the day of discharge.  Bring a copy of your healthcare power of attorney and living will documents.  [facility] - Preparing for Surgery Before surgery, you can play an important role.  Because skin is not sterile, your skin needs to be as free of germs as possible.  You can reduce the number of germs on your skin by washing with CHG (chlorahexidine gluconate) soap before surgery.  CHG is an antiseptic cleaner which kills germs and bonds with the skin to continue killing germs even after washing. Please DO NOT use if you have an allergy to CHG or antibacterial soaps.  If your skin becomes reddened/irritated stop using the CHG and inform your nurse when you arrive at Short Stay. Do not shave (including legs and underarms) for at least 48 hours prior to the  first CHG shower.  You may shave your face/neck.  Please follow these instructions carefully:  1.  Shower with CHG Soap the night before surgery and the  morning of surgery.  2.  If you choose to wash your hair, wash your hair first as usual with your normal  shampoo.  3.  After you shampoo, rinse your hair and body thoroughly to remove the shampoo.                             4.  Use CHG as you would any other liquid soap.  You can apply chg directly to the skin and wash.  Gently with a scrungie or clean washcloth.  5.  Apply the CHG Soap to your body ONLY FROM THE NECK DOWN.   Do   not use on face/ open                           Wound or open sores. Avoid contact with eyes, ears mouth and   genitals (private parts).                       Wash face,  Genitals (private parts) with your normal soap.             6.  Wash thoroughly, paying special attention to the area where  your    surgery  will be performed.  7.  Thoroughly rinse your body with warm water from the neck down.  8.  DO NOT shower/wash with your normal soap after using and rinsing off the CHG Soap.                9.  Pat yourself dry with a clean towel.            10.  Wear clean pajamas.            11.  Place clean sheets on your bed the night of your first shower and do not  sleep with pets. Day of Surgery : Do not apply any lotions/deodorants the morning of surgery.  Please wear clean clothes to the hospital/surgery center.  FAILURE TO FOLLOW THESE INSTRUCTIONS MAY RESULT IN THE CANCELLATION OF YOUR SURGERY  PATIENT SIGNATURE_________________________________  NURSE SIGNATURE__________________________________  ________________________________________________________________________

## 2017-06-05 ENCOUNTER — Other Ambulatory Visit: Payer: Self-pay

## 2017-06-05 ENCOUNTER — Encounter (HOSPITAL_COMMUNITY): Payer: Self-pay

## 2017-06-05 ENCOUNTER — Encounter (HOSPITAL_COMMUNITY)
Admission: RE | Admit: 2017-06-05 | Discharge: 2017-06-05 | Disposition: A | Discharge status: Home / Self Care | Payer: Medicare Other | Source: Ambulatory Visit | Attending: Obstetrics & Gynecology | Admitting: Obstetrics & Gynecology

## 2017-06-05 DIAGNOSIS — Z01812 Encounter for preprocedural laboratory examination: Secondary | ICD-10-CM | POA: Insufficient documentation

## 2017-06-05 DIAGNOSIS — N950 Postmenopausal bleeding: Secondary | ICD-10-CM | POA: Diagnosis not present

## 2017-06-05 HISTORY — DX: Deficiency of other specified B group vitamins: E53.8

## 2017-06-05 HISTORY — DX: Gastro-esophageal reflux disease without esophagitis: K21.9

## 2017-06-05 LAB — BASIC METABOLIC PANEL
ANION GAP: 10 (ref 5–15)
BUN: 11 mg/dL (ref 6–20)
CO2: 28 mmol/L (ref 22–32)
Calcium: 9.9 mg/dL (ref 8.9–10.3)
Chloride: 100 mmol/L — ABNORMAL LOW (ref 101–111)
Creatinine, Ser: 0.71 mg/dL (ref 0.44–1.00)
GFR calc Af Amer: >60 mL/min (ref >60)
Glucose, Bld: 127 mg/dL — ABNORMAL HIGH (ref 65–99)
Potassium: 4.7 mmol/L (ref 3.5–5.1)
SODIUM: 138 mmol/L (ref 135–145)

## 2017-06-05 LAB — CBC
HCT: 38.6 % (ref 36.0–46.0)
Hemoglobin: 12.8 g/dL (ref 12.0–15.0)
MCH: 30.8 pg (ref 26.0–34.0)
MCHC: 33.2 g/dL (ref 30.0–36.0)
MCV: 93 fL (ref 78.0–100.0)
PLATELETS: 218 10*3/uL (ref 150–400)
RBC: 4.15 MIL/uL (ref 3.87–5.11)
RDW: 13.4 % (ref 11.5–15.5)
WBC: 7.5 10*3/uL (ref 4.0–10.5)

## 2017-06-05 NOTE — H&P (Signed)
Alexandria Irwin is an 71 y.o. female. She is admitted with post menopausal bleeding and a thickened endometrium on office ultrasound  Pertinent Gynecological History: OB History: G4, P4   Menstrual History: No LMP recorded. Patient is postmenopausal.    Past Medical History:  Diagnosis Date  . Cataract   . Diabetes mellitus without complication ([facility])   . DVT (deep venous thrombosis) (HCC)    right leg  . GERD (gastroesophageal reflux disease)   . Graded compression stocking in place   . Hyperlipidemia   . Hypertension   . Hypothyroidism   . Pneumonia    history of  . Thyroid disease   . Vitamin B 12 deficiency   . Vitamin D deficiency     Past Surgical History:  Procedure Laterality Date  . CATARACT EXTRACTION W/PHACO Right 03/14/2014   Procedure: CATARACT EXTRACTION PHACO AND INTRAOCULAR LENS PLACEMENT ([facility]);  Surgeon: Tonny Branch, MD;  Location: [facility];  Service: Ophthalmology;  Laterality: Right;  CDE: 8.34  . CHOLECYSTECTOMY    . VEIN SURGERY Right    vein stripping-leg    Family History  Problem Relation Age of Onset  . Cancer Mother        ovarian    Social History:  reports that  has never smoked. she has never used smokeless tobacco. She reports that she does not drink alcohol or use drugs.  Allergies:  Allergies  Allergen Reactions  . Ace Inhibitors Cough  . Norvasc [Amlodipine] Cough  . Latex Rash  . Neosporin [Neomycin-Bacitracin Zn-Polymyx] Rash  . Sulfa Antibiotics Rash    No medications prior to admission.    Review of Systems  Constitutional: Negative.   Respiratory: Negative.   Cardiovascular: Negative.   Genitourinary: Negative.   Skin: Negative.   Neurological: Negative.     There were no vitals taken for this visit. Physical Exam  Constitutional: She is oriented to person, place, and time. She appears well-developed and well-nourished.  HENT:  Head: Normocephalic.  Eyes: Pupils are equal, round, and reactive to light.  Neck:  Normal range of motion.  Cardiovascular: Normal rate.  Respiratory: Effort normal.  GI: Soft.  Genitourinary: Vagina normal and uterus normal.  Neurological: She is alert and oriented to person, place, and time.  Skin: Skin is warm and dry.  Psychiatric: She has a normal mood and affect.    Results for orders placed or performed during the hospital encounter of 06/05/17 (from the past 24 hour(s))  CBC     Status: None   Collection Time: 06/05/17  1:55 PM  Result Value Ref Range   WBC 7.5 4.0 - 10.5 K/uL   RBC 4.15 3.87 - 5.11 MIL/uL   Hemoglobin 12.8 12.0 - 15.0 g/dL   HCT 38.6 36.0 - 46.0 %   MCV 93.0 78.0 - 100.0 fL   MCH 30.8 26.0 - 34.0 pg   MCHC 33.2 30.0 - 36.0 g/dL   RDW 13.4 11.5 - 15.5 %   Platelets 218 150 - 400 K/uL  Basic metabolic panel     Status: Abnormal   Collection Time: 06/05/17  1:55 PM  Result Value Ref Range   Sodium 138 135 - 145 mmol/L   Potassium 4.7 3.5 - 5.1 mmol/L   Chloride 100 (L) 101 - 111 mmol/L   CO2 28 22 - 32 mmol/L   Glucose, Bld 127 (H) 65 - 99 mg/dL   BUN 11 6 - 20 mg/dL   Creatinine, Ser 0.71 0.44 - 1.00  mg/dL   Calcium 9.9 8.9 - 10.3 mg/dL   GFR calc non Af Amer >60 >60 mL/min   GFR calc Af Amer >60 >60 mL/min   Anion gap 10 5 - 15    Office Ultrasound 04/30/17:  4 cm uterine mass which may be submucous.  Endometrium 7.8 mm.  Office ultrasound 10/17/16: 2.5 cm uterine mass, Endometrium 3.4 mm.  Assessment/Plan: Recurrent PMB. Change in ultrasound in 6 months.  Will proceed with hysteroscopy, endometrial biopsy, biopsy or removal of uterine mass if within the endometrial cavity.  Sharene Butters 06/05/2017, 9:51 PM

## 2017-06-13 ENCOUNTER — Ambulatory Visit (HOSPITAL_COMMUNITY): Payer: Medicare Other | Admitting: Anesthesiology

## 2017-06-13 ENCOUNTER — Ambulatory Visit (HOSPITAL_COMMUNITY)
Admission: AD | Admit: 2017-06-13 | Discharge: 2017-06-13 | Disposition: A | Discharge status: Home / Self Care | Payer: Medicare Other | Source: Ambulatory Visit | Attending: Obstetrics & Gynecology | Admitting: Obstetrics & Gynecology

## 2017-06-13 ENCOUNTER — Encounter (HOSPITAL_COMMUNITY): Payer: Self-pay

## 2017-06-13 ENCOUNTER — Encounter (HOSPITAL_COMMUNITY)
Admission: AD | Disposition: A | Discharge status: Home / Self Care | Payer: Self-pay | Source: Ambulatory Visit | Attending: Obstetrics & Gynecology

## 2017-06-13 DIAGNOSIS — Z86718 Personal history of other venous thrombosis and embolism: Secondary | ICD-10-CM | POA: Diagnosis not present

## 2017-06-13 DIAGNOSIS — Z9889 Other specified postprocedural states: Secondary | ICD-10-CM | POA: Diagnosis not present

## 2017-06-13 DIAGNOSIS — E119 Type 2 diabetes mellitus without complications: Secondary | ICD-10-CM | POA: Diagnosis not present

## 2017-06-13 DIAGNOSIS — Z882 Allergy status to sulfonamides status: Secondary | ICD-10-CM | POA: Insufficient documentation

## 2017-06-13 DIAGNOSIS — Z9841 Cataract extraction status, right eye: Secondary | ICD-10-CM | POA: Diagnosis not present

## 2017-06-13 DIAGNOSIS — E559 Vitamin D deficiency, unspecified: Secondary | ICD-10-CM | POA: Diagnosis not present

## 2017-06-13 DIAGNOSIS — Z79899 Other long term (current) drug therapy: Secondary | ICD-10-CM | POA: Insufficient documentation

## 2017-06-13 DIAGNOSIS — Z961 Presence of intraocular lens: Secondary | ICD-10-CM | POA: Insufficient documentation

## 2017-06-13 DIAGNOSIS — Z881 Allergy status to other antibiotic agents status: Secondary | ICD-10-CM | POA: Insufficient documentation

## 2017-06-13 DIAGNOSIS — Z91040 Latex allergy status: Secondary | ICD-10-CM | POA: Insufficient documentation

## 2017-06-13 DIAGNOSIS — Z7984 Long term (current) use of oral hypoglycemic drugs: Secondary | ICD-10-CM | POA: Insufficient documentation

## 2017-06-13 DIAGNOSIS — N950 Postmenopausal bleeding: Secondary | ICD-10-CM | POA: Insufficient documentation

## 2017-06-13 DIAGNOSIS — E039 Hypothyroidism, unspecified: Secondary | ICD-10-CM | POA: Diagnosis not present

## 2017-06-13 DIAGNOSIS — C541 Malignant neoplasm of endometrium: Secondary | ICD-10-CM | POA: Insufficient documentation

## 2017-06-13 DIAGNOSIS — E1151 Type 2 diabetes mellitus with diabetic peripheral angiopathy without gangrene: Secondary | ICD-10-CM | POA: Insufficient documentation

## 2017-06-13 DIAGNOSIS — I10 Essential (primary) hypertension: Secondary | ICD-10-CM | POA: Diagnosis not present

## 2017-06-13 DIAGNOSIS — Z888 Allergy status to other drugs, medicaments and biological substances status: Secondary | ICD-10-CM | POA: Diagnosis not present

## 2017-06-13 DIAGNOSIS — K219 Gastro-esophageal reflux disease without esophagitis: Secondary | ICD-10-CM | POA: Diagnosis not present

## 2017-06-13 DIAGNOSIS — Z79890 Hormone replacement therapy (postmenopausal): Secondary | ICD-10-CM | POA: Insufficient documentation

## 2017-06-13 DIAGNOSIS — E538 Deficiency of other specified B group vitamins: Secondary | ICD-10-CM | POA: Insufficient documentation

## 2017-06-13 DIAGNOSIS — Z8041 Family history of malignant neoplasm of ovary: Secondary | ICD-10-CM | POA: Diagnosis not present

## 2017-06-13 DIAGNOSIS — Z7982 Long term (current) use of aspirin: Secondary | ICD-10-CM | POA: Insufficient documentation

## 2017-06-13 DIAGNOSIS — E785 Hyperlipidemia, unspecified: Secondary | ICD-10-CM | POA: Diagnosis not present

## 2017-06-13 HISTORY — PX: HYSTEROSCOPY W/D&C: SHX1775

## 2017-06-13 LAB — GLUCOSE, CAPILLARY
GLUCOSE-CAPILLARY: 171 mg/dL — AB (ref 65–99)
Glucose-Capillary: 195 mg/dL — ABNORMAL HIGH (ref 65–99)

## 2017-06-13 SURGERY — DILATATION AND CURETTAGE /HYSTEROSCOPY
Anesthesia: General | Site: Vagina

## 2017-06-13 MED ORDER — LACTATED RINGERS IV SOLN
INTRAVENOUS | Status: DC
  Administered 2017-06-13 (×2): via INTRAVENOUS

## 2017-06-13 MED ORDER — MIDAZOLAM HCL 2 MG/2ML IJ SOLN
INTRAMUSCULAR | Status: AC
  Filled 2017-06-13: qty 2

## 2017-06-13 MED ORDER — FENTANYL CITRATE (PF) 100 MCG/2ML IJ SOLN
INTRAMUSCULAR | Status: DC | PRN
  Administered 2017-06-13 (×3): 50 ug via INTRAVENOUS

## 2017-06-13 MED ORDER — ONDANSETRON HCL 4 MG/2ML IJ SOLN
INTRAMUSCULAR | Status: AC
  Filled 2017-06-13: qty 2

## 2017-06-13 MED ORDER — LIDOCAINE HCL (CARDIAC) 20 MG/ML IV SOLN
INTRAVENOUS | Status: AC
  Filled 2017-06-13: qty 5

## 2017-06-13 MED ORDER — DEXAMETHASONE SODIUM PHOSPHATE 10 MG/ML IJ SOLN
INTRAMUSCULAR | Status: DC | PRN
  Administered 2017-06-13: 4 mg via INTRAVENOUS

## 2017-06-13 MED ORDER — DEXAMETHASONE SODIUM PHOSPHATE 10 MG/ML IJ SOLN
INTRAMUSCULAR | Status: AC
  Filled 2017-06-13: qty 1

## 2017-06-13 MED ORDER — LIDOCAINE HCL (CARDIAC) 20 MG/ML IV SOLN
INTRAVENOUS | Status: DC | PRN
  Administered 2017-06-13: 40 mg via INTRAVENOUS

## 2017-06-13 MED ORDER — KETOROLAC TROMETHAMINE 30 MG/ML IJ SOLN
INTRAMUSCULAR | Status: AC
  Filled 2017-06-13: qty 1

## 2017-06-13 MED ORDER — FENTANYL CITRATE (PF) 250 MCG/5ML IJ SOLN
INTRAMUSCULAR | Status: AC
  Filled 2017-06-13: qty 5

## 2017-06-13 MED ORDER — ONDANSETRON HCL 4 MG/2ML IJ SOLN
INTRAMUSCULAR | Status: DC | PRN
  Administered 2017-06-13: 4 mg via INTRAVENOUS

## 2017-06-13 MED ORDER — LIDOCAINE HCL 2 % IJ SOLN
INTRAMUSCULAR | Status: AC
  Filled 2017-06-13: qty 20

## 2017-06-13 MED ORDER — MIDAZOLAM HCL 5 MG/5ML IJ SOLN
INTRAMUSCULAR | Status: DC | PRN
  Administered 2017-06-13: 1 mg via INTRAVENOUS

## 2017-06-13 MED ORDER — KETOROLAC TROMETHAMINE 30 MG/ML IJ SOLN
INTRAMUSCULAR | Status: DC | PRN
  Administered 2017-06-13: 15 mg via INTRAVENOUS

## 2017-06-13 MED ORDER — SODIUM CHLORIDE 0.9 % IR SOLN
Status: DC | PRN
  Administered 2017-06-13: 3000 mL

## 2017-06-13 MED ORDER — PROPOFOL 10 MG/ML IV BOLUS
INTRAVENOUS | Status: AC
  Filled 2017-06-13: qty 20

## 2017-06-13 MED ORDER — PROPOFOL 10 MG/ML IV BOLUS
INTRAVENOUS | Status: DC | PRN
  Administered 2017-06-13: 130 mg via INTRAVENOUS

## 2017-06-13 SURGICAL SUPPLY — 24 items
BIPOLAR CUTTING LOOP 21FR (ELECTRODE)
CANISTER SUCT 3000ML PPV (MISCELLANEOUS) ×4 IMPLANT
CATH FOLEY LATEX FREE 14FR (CATHETERS) ×2
CATH FOLEY LF 14FR (CATHETERS) ×2 IMPLANT
CATH ROBINSON RED A/P 16FR (CATHETERS) IMPLANT
CONTAINER PREFILL 10% NBF 60ML (FORM) ×4 IMPLANT
ELECT REM PT RETURN 9FT ADLT (ELECTROSURGICAL)
ELECTRODE REM PT RTRN 9FT ADLT (ELECTROSURGICAL) IMPLANT
GLOVE BIOGEL PI IND STRL 6 (GLOVE) ×4 IMPLANT
GLOVE BIOGEL PI IND STRL 7.0 (GLOVE) ×2 IMPLANT
GLOVE BIOGEL PI INDICATOR 6 (GLOVE) ×4
GLOVE BIOGEL PI INDICATOR 7.0 (GLOVE) ×2
GLOVE ECLIPSE 6.0 STRL STRAW (GLOVE) IMPLANT
GOWN STRL REUS W/TWL LRG LVL3 (GOWN DISPOSABLE) ×8 IMPLANT
KIT BERKELEY 1ST TRIMESTER 3/8 (MISCELLANEOUS) ×4 IMPLANT
LOOP CUTTING BIPOLAR 21FR (ELECTRODE) IMPLANT
PACK VAGINAL MINOR WOMEN LF (CUSTOM PROCEDURE TRAY) ×4 IMPLANT
PAD OB MATERNITY 4.3X12.25 (PERSONAL CARE ITEMS) ×4 IMPLANT
PAD PREP 24X48 CUFFED NSTRL (MISCELLANEOUS) ×4 IMPLANT
SET BERKELEY SUCTION TUBING (SUCTIONS) ×4 IMPLANT
TOWEL OR 17X24 6PK STRL BLUE (TOWEL DISPOSABLE) ×8 IMPLANT
TUBING AQUILEX INFLOW (TUBING) ×4 IMPLANT
TUBING AQUILEX OUTFLOW (TUBING) ×4 IMPLANT
VACURETTE 6 ASPIR F TIP BERK (CANNULA) ×4 IMPLANT

## 2017-06-13 NOTE — Anesthesia Preprocedure Evaluation (Signed)
Anesthesia Evaluation  Patient identified by MRN, date of birth, ID band Patient awake    Reviewed: Allergy & Precautions, H&P , NPO status , Patient's Chart, lab work & pertinent test results, reviewed documented beta blocker date and time   Airway Mallampati: II  TM Distance: >3 FB     Dental  (+) Teeth Intact   Pulmonary neg pulmonary ROS,    breath sounds clear to auscultation       Cardiovascular hypertension, Pt. on home beta blockers and Pt. on medications + Peripheral Vascular Disease   Rhythm:Regular Rate:Normal     Neuro/Psych    GI/Hepatic negative GI ROS, GERD  ,  Endo/Other  diabetes, Well Controlled, Type 2, Oral Hypoglycemic AgentsHypothyroidism   Renal/GU      Musculoskeletal   Abdominal   Peds  Hematology   Anesthesia Other Findings   Reproductive/Obstetrics                             Anesthesia Physical  Anesthesia Plan  ASA: III  Anesthesia Plan: General   Post-op Pain Management:    Induction: Intravenous  PONV Risk Score and Plan: 3 and Ondansetron, Dexamethasone and Midazolam  Airway Management Planned: LMA  Additional Equipment:   Intra-op Plan:   Post-operative Plan: Extubation in OR  Informed Consent: I have reviewed the patients History and Physical, chart, labs and discussed the procedure including the risks, benefits and alternatives for the proposed anesthesia with the patient or authorized representative who has indicated his/her understanding and acceptance.   Dental advisory given  Plan Discussed with:   Anesthesia Plan Comments:         Anesthesia Quick Evaluation

## 2017-06-13 NOTE — Transfer of Care (Signed)
Immediate Anesthesia Transfer of Care Note  Patient: Alexandria Irwin  Procedure(s) Performed: DILATATION AND CURETTAGE /HYSTEROSCOPY (N/A Vagina )  Patient Location: PACU  Anesthesia Type:General  Level of Consciousness: sedated  Airway & Oxygen Therapy: Patient Spontanous Breathing and Patient connected to nasal cannula oxygen  Post-op Assessment: Report given to RN and Post -op Vital signs reviewed and stable  Post vital signs: stable  Last Vitals:  Vitals:   06/13/17 0835  BP: 131/78  Pulse: 63  Resp: 16  Temp: 36.7 C  SpO2: 99%    Last Pain:  Vitals:   06/13/17 0835  TempSrc: Oral      Patients Stated Pain Goal: 3 (15/18/34 3735)  Complications: No apparent anesthesia complications

## 2017-06-13 NOTE — Discharge Instructions (Signed)

## 2017-06-13 NOTE — Anesthesia Procedure Notes (Signed)
Procedure Name: LMA Insertion Date/Time: 06/13/2017 9:55 AM Performed by: Ignacia Bayley, CRNA Pre-anesthesia Checklist: Patient identified, Patient being monitored, Emergency Drugs available, Timeout performed and Suction available Patient Re-evaluated:Patient Re-evaluated prior to induction Oxygen Delivery Method: Circle System Utilized Preoxygenation: Pre-oxygenation with 100% oxygen Induction Type: IV induction Ventilation: Mask ventilation without difficulty LMA: LMA inserted LMA Size: 4.0 Number of attempts: 1 Placement Confirmation: positive ETCO2 and breath sounds checked- equal and bilateral Dental Injury: Teeth and Oropharynx as per pre-operative assessment

## 2017-06-13 NOTE — Progress Notes (Signed)
I have interviewed and performed the pertinent exams on my patient to confirm that there have been no significant changes in her condition since the dictation of her history and physical exam.  

## 2017-06-13 NOTE — Op Note (Signed)
Patient Name: Alexandria Irwin MRN: [id number]  Date of Surgery: 06/13/2017    PREOPERATIVE DIAGNOSIS: POSTMENOPAUSAL BLEEDING  POSTOPERATIVE DIAGNOSIS: POSTMENOPAUSAL BLEEDING   PROCEDURE: Hysteroscopy, D&C  SURGEON: Sota Hetz D. Deatra Ina M.D.  ANESTHESIA: General  ESTIMATED BLOOD LOSS: 5 mL  FINDINGS: Hypertrophic endometrial tissue, no polyps, no myoma   INDICATIONS: Post menopausal bleeding.  PROCEDURE IN DETAIL: The patient was taken to the OR and placed in the dors-lithotomy position. The perineum and vagina were prepped and draped in a sterile fashion. Bimanual exam revealed an anteverted,normal sized uterus. The uterus sounded to 8 cm. Pratt dilators were used to open the cervix to 60 [place]. The diagnostic hysteroscope was introduced and the cavity showed diffuse thickened endometrium.  No polyps or myoma were seen.  Polyp forceps, the Tiemann curette, and a #6 suction currette were used to sample the entire endometrium.  The hysteroscope was reintroduced to confirm adequate endometrial sampling. The procedure was then terminated and the patient left the operating room in good condition.

## 2017-06-13 NOTE — Anesthesia Postprocedure Evaluation (Signed)
Anesthesia Post Note  Patient: Alexandria Irwin  Procedure(s) Performed: DILATATION AND CURETTAGE /HYSTEROSCOPY (N/A Vagina )     Patient location during evaluation: PACU Anesthesia Type: General Level of consciousness: awake and alert Pain management: pain level controlled Vital Signs Assessment: post-procedure vital signs reviewed and stable Respiratory status: spontaneous breathing, nonlabored ventilation and respiratory function stable Cardiovascular status: blood pressure returned to baseline and stable Postop Assessment: no apparent nausea or vomiting Anesthetic complications: no    Last Vitals:  Vitals:   06/13/17 1130 06/13/17 1205  BP: 137/75 124/70  Pulse: 67 63  Resp: 19 16  Temp:    SpO2: 97% 98%    Last Pain:  Vitals:   06/13/17 0835  TempSrc: Oral   Pain Goal: Patients Stated Pain Goal: 3 (06/13/17 0835)               Lynda Rainwater

## 2017-06-14 ENCOUNTER — Encounter (HOSPITAL_COMMUNITY): Payer: Self-pay | Admitting: Obstetrics & Gynecology

## 2017-06-25 ENCOUNTER — Encounter: Payer: Self-pay | Admitting: Gynecologic Oncology

## 2017-06-25 ENCOUNTER — Other Ambulatory Visit: Payer: Self-pay | Admitting: Gynecologic Oncology

## 2017-06-25 ENCOUNTER — Ambulatory Visit: Payer: Medicare Other | Attending: Gynecologic Oncology | Admitting: Gynecologic Oncology

## 2017-06-25 VITALS — BP 149/77 | HR 68 | Temp 98.2°F | Resp 18 | Wt 174.5 lb

## 2017-06-25 DIAGNOSIS — Z9889 Other specified postprocedural states: Secondary | ICD-10-CM | POA: Diagnosis not present

## 2017-06-25 DIAGNOSIS — Z86718 Personal history of other venous thrombosis and embolism: Secondary | ICD-10-CM | POA: Diagnosis not present

## 2017-06-25 DIAGNOSIS — N950 Postmenopausal bleeding: Secondary | ICD-10-CM | POA: Diagnosis not present

## 2017-06-25 DIAGNOSIS — I10 Essential (primary) hypertension: Secondary | ICD-10-CM | POA: Insufficient documentation

## 2017-06-25 DIAGNOSIS — Z888 Allergy status to other drugs, medicaments and biological substances status: Secondary | ICD-10-CM | POA: Insufficient documentation

## 2017-06-25 DIAGNOSIS — Z9841 Cataract extraction status, right eye: Secondary | ICD-10-CM | POA: Insufficient documentation

## 2017-06-25 DIAGNOSIS — Z87440 Personal history of urinary (tract) infections: Secondary | ICD-10-CM | POA: Insufficient documentation

## 2017-06-25 DIAGNOSIS — K429 Umbilical hernia without obstruction or gangrene: Secondary | ICD-10-CM | POA: Insufficient documentation

## 2017-06-25 DIAGNOSIS — C541 Malignant neoplasm of endometrium: Secondary | ICD-10-CM | POA: Diagnosis not present

## 2017-06-25 DIAGNOSIS — Z91040 Latex allergy status: Secondary | ICD-10-CM | POA: Insufficient documentation

## 2017-06-25 DIAGNOSIS — Z882 Allergy status to sulfonamides status: Secondary | ICD-10-CM | POA: Insufficient documentation

## 2017-06-25 DIAGNOSIS — Z79899 Other long term (current) drug therapy: Secondary | ICD-10-CM | POA: Diagnosis not present

## 2017-06-25 DIAGNOSIS — E785 Hyperlipidemia, unspecified: Secondary | ICD-10-CM | POA: Diagnosis not present

## 2017-06-25 DIAGNOSIS — E538 Deficiency of other specified B group vitamins: Secondary | ICD-10-CM | POA: Diagnosis not present

## 2017-06-25 DIAGNOSIS — E039 Hypothyroidism, unspecified: Secondary | ICD-10-CM | POA: Diagnosis not present

## 2017-06-25 DIAGNOSIS — Z8041 Family history of malignant neoplasm of ovary: Secondary | ICD-10-CM | POA: Insufficient documentation

## 2017-06-25 DIAGNOSIS — E669 Obesity, unspecified: Secondary | ICD-10-CM

## 2017-06-25 DIAGNOSIS — Z9049 Acquired absence of other specified parts of digestive tract: Secondary | ICD-10-CM | POA: Diagnosis not present

## 2017-06-25 DIAGNOSIS — E1136 Type 2 diabetes mellitus with diabetic cataract: Secondary | ICD-10-CM | POA: Insufficient documentation

## 2017-06-25 DIAGNOSIS — E119 Type 2 diabetes mellitus without complications: Secondary | ICD-10-CM

## 2017-06-25 DIAGNOSIS — Z7982 Long term (current) use of aspirin: Secondary | ICD-10-CM | POA: Insufficient documentation

## 2017-06-25 DIAGNOSIS — E559 Vitamin D deficiency, unspecified: Secondary | ICD-10-CM | POA: Diagnosis not present

## 2017-06-25 DIAGNOSIS — Z7984 Long term (current) use of oral hypoglycemic drugs: Secondary | ICD-10-CM | POA: Insufficient documentation

## 2017-06-25 HISTORY — DX: Malignant neoplasm of endometrium: C54.1

## 2017-06-25 NOTE — Progress Notes (Signed)
Consult Note: Gyn-Onc  Consult was requested by Dr. Deatra Ina for the evaluation of MERNA BALDI 71 y.o. female  CC:  Chief Complaint  Patient presents with  . Endometrial adenocarcinoma [facility])    Assessment/Plan:  Ms. ERICHA WHITTINGHAM  is a 71 y.o.  year old with grade 1 endometrial cancer and an umbilical hernia in the setting of type II DM and obesity.  A detailed discussion was held with the patient and her family with regard to to her endometrial cancer diagnosis. We discussed the standard management options for uterine cancer which includes surgery followed possibly by adjuvant therapy depending on the results of surgery. The options for surgical management include a hysterectomy and removal of the tubes and ovaries possibly with removal of pelvic and para-aortic lymph nodes.If feasible, a minimally invasive approach including a robotic hysterectomy or laparoscopic hysterectomy have benefits including shorter hospital stay, recovery time and better wound healing than with open surgery. The patient has been counseled about these surgical options and the risks of surgery in general including infection, bleeding, damage to surrounding structures (including bowel, bladder, ureters, nerves or vessels), and the postoperative risks of PE/ DVT, and lymphedema. I extensively reviewed the additional risks of robotic hysterectomy including possible need for conversion to open laparotomy.  I discussed positioning during surgery of trendelenberg and risks of minor facial swelling and care we take in preoperative positioning.  After counseling and consideration of her options, she desires to proceed with robotic assisted total hysterectomy with bilateral sapingo-oophorectomy and SLN biopsy.   She has an umbilical hernia which we will repair primarily at completion of the surgery. I discussed the risks of recurrence with this, particularly given her obesity.   She will be seen by anesthesia for preoperative  clearance and discussion of postoperative pain management.  She was given the opportunity to ask questions, which were answered to her satisfaction, and she is agreement with the above mentioned plan of care.  She will stop ASA 10 days preop and has been counseled regarding the increased risk for bleeding surrounding ASA use.   HPI: Ms. Whittney Steenson is a 71 year old para 4 who was seen in consultation at the request of Dr. Deatra Ina for grade 1 endometrial cancer.  The patient has a history of postmenopausal bleeding since November 2017.  At this time she saw her primary care doctor who diagnosed a urinary tract infection and treated her intermittently with antibiotics for approximately 3 months.  However given the persistence of the symptoms she then sought evaluation with her gynecologist Dr. Deatra Ina in April 2018.  At this time a transvaginal ultrasound scan was performed which showed some fibroids in the uterus but no thickening of the endometrium.  Pap smear and pelvic examination were unremarkable and therefore it was felt that the source of bleeding was not gynecologic.  She was then sent to Dr. Noah Delaine from [facility] urology who performed an evaluation including a CT scan of the abdomen and pelvis in September 2018.  This showed grossly normal urologic system and no evidence of extrauterine disease however the uterus was slightly 4 cm intrauterine mass concerning for neoplasm.  Dr. Alyson Ingles then performed a cystoscopy which was also unremarkable.  She was referred back to Dr. Deatra Ina for further evaluation.  In October 2018 Dr. Deatra Ina repeated the pelvic ultrasound scan which again confirmed what appeared to be a thin endometrium with fibroids.  However given the persistence of bleeding and the CT findings he took the patient to  the operating room on June 13, 2017 and performed a hysteroscopy D&C and polypectomy.  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  The patient  otherwise has medical history significant for a DVT in the right lower extremity approximately 12 years ago that was spontaneous.  She was treated with aspirin 325 mg daily but no anti-thrombotic agents.  She has had no recurrences since that time.  She has a history of type 2 diabetes mellitus with fairly poorly controlled with an HbA1c earlier in 2018 of 9.2%.  At that time she is taking only metformin.  She was then added Januvia.  Follow-up HbA1c in November 2018 had reduced to 7.2%.  The patient reports fasting blood glucose levels between 07/31/1950.  Her primary care doctor Dr. Lenna Gilford manages her blood glucose.  Patient also has hypertension hypercholesterolemia.  She denies a coronary artery disease history.  She has had 4 prior vaginal deliveries.  She has no family history concerning for Lynch syndrome.  She is obese with a height of 5 foot 2 inches and a weight of 174 pounds.  Her prior abdominal surgery includes a laparoscopic cholecystectomy.  She has an umbilical hernia.  Current Meds:  Outpatient Encounter Medications as of 06/25/2017  Medication Sig  . acetaminophen (TYLENOL) 500 MG tablet Take 1,000 mg by mouth every 8 (eight) hours as needed for mild pain or headache.  . albuterol (PROVENTIL HFA;VENTOLIN HFA) 108 (90 BASE) MCG/ACT inhaler Inhale 2 puffs into the lungs every 6 (six) hours as needed for wheezing or shortness of breath.  Marland Kitchen aspirin 325 MG EC tablet Take 325 mg by mouth daily.  . B Complex Vitamins (VITAMIN B-COMPLEX PO) Take 1 tablet by mouth daily.  . Calcium Citrate-Vitamin D ([employer] CALCIUM CITRATE +D PO) Take 2 tablets by mouth every evening.   . Cholecalciferol (D 2000) 2000 units TABS Take 2,000 Units by mouth daily.  . cloNIDine (CATAPRES) 0.1 MG tablet TAKE 1 TABLET BY MOUTH TWICE DAILY  . cycloSPORINE (RESTASIS) 0.05 % ophthalmic emulsion Place 1 drop into both eyes 2 (two) times daily.   . furosemide (LASIX) 40 MG tablet TAKE ONE TABLET BY MOUTH ONCE DAILY  .  glucose blood (ONETOUCH VERIO) test strip USE TO CHECK BLOOD SUGAR DAILY  . levothyroxine (SYNTHROID, LEVOTHROID) 100 MCG tablet TAKE 1 TABLET BY MOUTH ONCE DAILY  . LORazepam (ATIVAN) 0.5 MG tablet Take 1/2-1 tablet at bedtime as needed (Patient taking differently: Take 0.25 mg by mouth at bedtime as needed for sleep. )  . losartan (COZAAR) 100 MG tablet TAKE ONE TABLET BY MOUTH ONCE DAILY  . Magnesium 250 MG TABS Take 250 mg by mouth every evening.   . meclizine (ANTIVERT) 50 MG tablet Take 1 tablet (50 mg total) by mouth 2 (two) times daily as needed. (Patient taking differently: Take 50 mg by mouth 2 (two) times daily as needed for dizziness. )  . metFORMIN (GLUCOPHAGE) 500 MG tablet TAKE 2 TABLETS BY MOUTH ONCE DAILY IN THE MORNING AND TAKE 1 TABLET ONCE DAILY IN THE EVENING (Patient taking differently: Take 500-1,000 mg by mouth 2 (two) times daily with a meal. TAKE 2 TABLETS BY MOUTH ONCE DAILY IN THE MORNING AND TAKE 1 TABLET ONCE DAILY IN THE EVENING)  . metoprolol succinate (TOPROL-XL) 50 MG 24 hr tablet TAKE ONE TABLET BY MOUTH TWICE DAILY WITH  OR  IMMEDIATELY  FOLLOWING  A  MEAL  . Omega-3 Fatty Acids (FISH OIL) 1000 MG CAPS Take 1,000 mg by  mouth every evening.  Glory Rosebush DELICA LANCETS 93Y MISC USE ONE  TO CHECK GLUCOSE ONCE DAILY  . simvastatin (ZOCOR) 40 MG tablet TAKE 1 TABLET BY MOUTH ONCE DAILY AT 6PM (Patient taking differently: Take 40 mg by mouth daily at 6 PM. )  . sitaGLIPtin (JANUVIA) 50 MG tablet Take 1 tablet (50 mg total) by mouth daily.  . vitamin C (ASCORBIC ACID) 500 MG tablet Take 500 mg by mouth daily.   No facility-administered encounter medications on file as of 06/25/2017.     Allergy:  Allergies  Allergen Reactions  . Ace Inhibitors Cough  . Norvasc [Amlodipine] Cough  . Latex Rash  . Neosporin [Neomycin-Bacitracin Zn-Polymyx] Rash  . Sulfa Antibiotics Rash    Social Hx:   Social History   Socioeconomic History  . Marital status: Married     Spouse name: Not on file  . Number of children: Not on file  . Years of education: Not on file  . Highest education level: Not on file  Social Needs  . Financial resource strain: Not on file  . Food insecurity - worry: Not on file  . Food insecurity - inability: Not on file  . Transportation needs - medical: Not on file  . Transportation needs - non-medical: Not on file  Occupational History  . Not on file  Tobacco Use  . Smoking status: Never Smoker  . Smokeless tobacco: Never Used  Substance and Sexual Activity  . Alcohol use: No  . Drug use: No  . Sexual activity: Yes    Birth control/protection: None, Post-menopausal  Other Topics Concern  . Not on file  Social History Narrative  . Not on file    Past Surgical Hx:  Past Surgical History:  Procedure Laterality Date  . CATARACT EXTRACTION W/PHACO Right 03/14/2014   Procedure: CATARACT EXTRACTION PHACO AND INTRAOCULAR LENS PLACEMENT (IOC);  Surgeon: Tonny Branch, MD;  Location: [facility];  Service: Ophthalmology;  Laterality: Right;  CDE: 8.34  . CHOLECYSTECTOMY    . HYSTEROSCOPY W/D&C N/A 06/13/2017   Procedure: DILATATION AND CURETTAGE /HYSTEROSCOPY;  Surgeon: Alden Hipp, MD;  Location: [place] [facility];  Service: Gynecology;  Laterality: N/A;  . VEIN SURGERY Right    vein stripping-leg    Past Medical Hx:  Past Medical History:  Diagnosis Date  . Cataract   . Diabetes mellitus without complication ([place])   . DVT (deep venous thrombosis) ([facility])    right leg  . GERD (gastroesophageal reflux disease)   . Graded compression stocking in place   . Hyperlipidemia   . Hypertension   . Hypothyroidism   . Pneumonia    history of  . Thyroid disease   . Vitamin B 12 deficiency   . Vitamin D deficiency     Past Gynecological History:  SVD x 4 No LMP recorded. Patient is postmenopausal.  Family Hx:  Family History  Problem Relation Age of Onset  . Cancer Mother        ovarian    Review of Systems:  Constitutional  Feels  well,    ENT Normal appearing ears and nares bilaterally Skin/Breast  No rash, sores, jaundice, itching, dryness Cardiovascular  No chest pain, shortness of breath, or edema  Pulmonary  No cough or wheeze.  Gastro Intestinal  No nausea, vomitting, or diarrhoea. No bright red blood per rectum, no abdominal pain, change in bowel movement, or constipation.  Genito Urinary  No frequency, urgency, dysuria, + postmenopausal bleeding Musculo Skeletal  No myalgia,  arthralgia, joint swelling or pain  Neurologic  No weakness, numbness, change in gait,  Psychology  No depression, anxiety, insomnia.   Vitals:  Blood pressure (!) 149/77, pulse 68, temperature 98.2 F (36.8 C), temperature source Oral, resp. rate 18, weight 174 lb 8 oz (79.2 kg), SpO2 97 %.  Physical Exam: WD in NAD Neck  Supple NROM, without any enlargements.  Lymph Node Survey No cervical supraclavicular or inguinal adenopathy Cardiovascular  Pulse normal rate, regularity and rhythm. S1 and S2 normal.  Lungs  Clear to auscultation bilateraly, without wheezes/crackles/rhonchi. Good air movement.  Skin  No rash/lesions/breakdown  Psychiatry  Alert and oriented to person, place, and time  Abdomen  Normoactive bowel sounds, abdomen soft, non-tender and obese with a reducible 2cm umbilical hernia.  Back No CVA tenderness Genito Urinary  Vulva/vagina: Normal external female genitalia.   No lesions. No discharge or bleeding.  Bladder/urethra:  No lesions or masses, well supported bladder  Vagina: normal  Cervix: Normal appearing, no lesions.  Uterus:  Small, mobile, no parametrial involvement or nodularity.  Adnexa: no palpable masses. Rectal  Good tone, no masses no cul de sac nodularity.  Extremities  No bilateral cyanosis, clubbing or edema.   Donaciano Eva, MD  06/25/2017, 12:36 PM

## 2017-06-25 NOTE — Patient Instructions (Addendum)
Preparing for your Surgery  Plan for surgery on August 05, 2017 with Dr. Everitt Amber at [place] will be scheduled for a robotic assisted total hysterectomy, bilateral salpingo-oophorectomy, sentinel lymph node biopsy, umbilical hernia repair.  STOP ASPIRIN 10 DAYS BEFORE SURGERY  Pre-operative Testing -You will receive a phone call from presurgical testing at [facility] to arrange for a pre-operative testing appointment before your surgery.  This appointment normally occurs one to two weeks before your scheduled surgery.   -Bring your insurance card, copy of an advanced directive if applicable, medication list  -At that visit, you will be asked to sign a consent for a possible blood transfusion in case a transfusion becomes necessary during surgery.  The need for a blood transfusion is rare but having consent is a necessary part of your care.     -You should not be taking blood thinners or aspirin at least ten days prior to surgery unless instructed by your surgeon.  Day Before Surgery at [place] will be asked to take in a light diet the day before surgery.  Avoid carbonated beverages.  You will be advised to have nothing to eat or drink after midnight the evening before.    Eat a light diet the day before surgery.  Examples including soups, broths, toast, yogurt, mashed potatoes.  Things to avoid include carbonated beverages (fizzy beverages), raw fruits and raw vegetables, or beans.   If your bowels are filled with gas, your surgeon will have difficulty visualizing your pelvic organs which increases your surgical risks.  Your role in recovery Your role is to become active as soon as directed by your doctor, while still giving yourself time to heal.  Rest when you feel tired. You will be asked to do the following in order to speed your recovery:  - Cough and breathe deeply. This helps toclear and expand your lungs and can prevent pneumonia. You  may be given a spirometer to practice deep breathing. A staff member will show you how to use the spirometer. - Do mild physical activity. Walking or moving your legs help your circulation and body functions return to normal. A staff member will help you when you try to walk and will provide you with simple exercises. Do not try to get up or walk alone the first time. - Actively manage your pain. Managing your pain lets you move in comfort. We will ask you to rate your pain on a scale of zero to 10. It is your responsibility to tell your doctor or nurse where and how much you hurt so your pain can be treated.  Special Considerations -If you are diabetic, you may be placed on insulin after surgery to have closer control over your blood sugars to promote healing and recovery.  This does not mean that you will be discharged on insulin.  If applicable, your oral antidiabetics will be resumed when you are tolerating a solid diet.  -Your final pathology results from surgery should be available by the Friday after surgery and the results will be relayed to you when available.  -Dr. Lahoma Crocker is the Surgeon that assists your GYN Oncologist with surgery.  The next day after your surgery you will either see your GYN Oncologist or Dr. Lahoma Crocker.   Blood Transfusion Information WHAT IS A BLOOD TRANSFUSION? A transfusion is the replacement of blood or some of its parts. Blood is made up of multiple cells which provide different functions.  Red blood cells carry oxygen and are used for blood loss replacement.  White blood cells fight against infection.  Platelets control bleeding.  Plasma helps clot blood.  Other blood products are available for specialized needs, such as hemophilia or other clotting disorders. BEFORE THE TRANSFUSION  Who gives blood for transfusions?   You may be able to donate blood to be used at a later date on yourself (autologous donation).  Relatives can be  asked to donate blood. This is generally not any safer than if you have received blood from a stranger. The same precautions are taken to ensure safety when a relative's blood is donated.  Healthy volunteers who are fully evaluated to make sure their blood is safe. This is blood bank blood. Transfusion therapy is the safest it has ever been in the practice of medicine. Before blood is taken from a donor, a complete history is taken to make sure that person has no history of diseases nor engages in risky social behavior (examples are intravenous drug use or sexual activity with multiple partners). The donor's travel history is screened to minimize risk of transmitting infections, such as malaria. The donated blood is tested for signs of infectious diseases, such as HIV and hepatitis. The blood is then tested to be sure it is compatible with you in order to minimize the chance of a transfusion reaction. If you or a relative donates blood, this is often done in anticipation of surgery and is not appropriate for emergency situations. It takes many days to process the donated blood. RISKS AND COMPLICATIONS Although transfusion therapy is very safe and saves many lives, the main dangers of transfusion include:   Getting an infectious disease.  Developing a transfusion reaction. This is an allergic reaction to something in the blood you were given. Every precaution is taken to prevent this. The decision to have a blood transfusion has been considered carefully by your caregiver before blood is given. Blood is not given unless the benefits outweigh the risks.

## 2017-06-26 ENCOUNTER — Telehealth: Payer: Self-pay | Admitting: Family

## 2017-06-26 ENCOUNTER — Ambulatory Visit (INDEPENDENT_AMBULATORY_CARE_PROVIDER_SITE_OTHER): Payer: Medicare Other

## 2017-06-26 ENCOUNTER — Telehealth: Payer: Self-pay | Admitting: *Deleted

## 2017-06-26 ENCOUNTER — Other Ambulatory Visit: Payer: Self-pay | Admitting: Family

## 2017-06-26 DIAGNOSIS — C541 Malignant neoplasm of endometrium: Secondary | ICD-10-CM

## 2017-06-26 DIAGNOSIS — R5383 Other fatigue: Secondary | ICD-10-CM

## 2017-06-26 DIAGNOSIS — E538 Deficiency of other specified B group vitamins: Secondary | ICD-10-CM

## 2017-06-26 MED ORDER — CYANOCOBALAMIN 1000 MCG/ML IJ SOLN
1000.0000 ug | Freq: Every day | INTRAMUSCULAR | Status: DC
  Administered 2017-06-26 – 2018-05-26 (×17): 1000 ug via INTRAMUSCULAR

## 2017-06-26 NOTE — Telephone Encounter (Signed)
Vit B 12 injections ordered

## 2017-06-26 NOTE — Telephone Encounter (Signed)
Do we need another B 12 blood level?  Had done on 05-26-17.  If needing to start a B-12 injection , please put in order.  Cancer doctor was inquiring about her levels.

## 2017-06-26 NOTE — Telephone Encounter (Signed)
Patient aware.

## 2017-06-26 NOTE — Telephone Encounter (Signed)
Vit B12 lab ordered.

## 2017-06-27 ENCOUNTER — Ambulatory Visit: Payer: Medicare Other

## 2017-06-27 ENCOUNTER — Ambulatory Visit (INDEPENDENT_AMBULATORY_CARE_PROVIDER_SITE_OTHER): Payer: Medicare Other | Admitting: *Deleted

## 2017-06-27 DIAGNOSIS — E538 Deficiency of other specified B group vitamins: Secondary | ICD-10-CM

## 2017-06-27 NOTE — Progress Notes (Signed)
Pt given B12 injection IM right deltoid and tolerated well. 

## 2017-06-28 ENCOUNTER — Ambulatory Visit (INDEPENDENT_AMBULATORY_CARE_PROVIDER_SITE_OTHER): Payer: Medicare Other | Admitting: Physician Assistant

## 2017-07-02 ENCOUNTER — Ambulatory Visit (INDEPENDENT_AMBULATORY_CARE_PROVIDER_SITE_OTHER): Payer: Medicare Other | Admitting: *Deleted

## 2017-07-02 DIAGNOSIS — E538 Deficiency of other specified B group vitamins: Secondary | ICD-10-CM

## 2017-07-02 NOTE — Patient Instructions (Signed)
Cyanocobalamin, Vitamin B12 injection What is this medicine? CYANOCOBALAMIN (sye an oh koe BAL a min) is a man made form of vitamin B12. Vitamin B12 is used in the growth of healthy blood cells, nerve cells, and proteins in the body. It also helps with the metabolism of fats and carbohydrates. This medicine is used to treat people who can not absorb vitamin B12. This medicine may be used for other purposes; ask your health care provider or pharmacist if you have questions. COMMON BRAND NAME(S): B-12 Compliance Kit, B-12 Injection Kit, Cyomin, LA-12, Nutri-Twelve, Physicians EZ Use B-12, Primabalt What should I tell my health care provider before I take this medicine? They need to know if you have any of these conditions: -kidney disease -Leber's disease -megaloblastic anemia -an unusual or allergic reaction to cyanocobalamin, cobalt, other medicines, foods, dyes, or preservatives -pregnant or trying to get pregnant -breast-feeding How should I use this medicine? This medicine is injected into a muscle or deeply under the skin. It is usually given by a health care professional in a clinic or doctor's office. However, your doctor may teach you how to inject yourself. Follow all instructions. Talk to your pediatrician regarding the use of this medicine in children. Special care may be needed. Overdosage: If you think you have taken too much of this medicine contact a poison control center or emergency room at once. NOTE: This medicine is only for you. Do not share this medicine with others. What if I miss a dose? If you are given your dose at a clinic or doctor's office, call to reschedule your appointment. If you give your own injections and you miss a dose, take it as soon as you can. If it is almost time for your next dose, take only that dose. Do not take double or extra doses. What may interact with this medicine? -colchicine -heavy alcohol intake This list may not describe all possible  interactions. Give your health care provider a list of all the medicines, herbs, non-prescription drugs, or dietary supplements you use. Also tell them if you smoke, drink alcohol, or use illegal drugs. Some items may interact with your medicine. What should I watch for while using this medicine? Visit your doctor or health care professional regularly. You may need blood work done while you are taking this medicine. You may need to follow a special diet. Talk to your doctor. Limit your alcohol intake and avoid smoking to get the best benefit. What side effects may I notice from receiving this medicine? Side effects that you should report to your doctor or health care professional as soon as possible: -allergic reactions like skin rash, itching or hives, swelling of the face, lips, or tongue -blue tint to skin -chest tightness, pain -difficulty breathing, wheezing -dizziness -red, swollen painful area on the leg Side effects that usually do not require medical attention (report to your doctor or health care professional if they continue or are bothersome): -diarrhea -headache This list may not describe all possible side effects. Call your doctor for medical advice about side effects. You may report side effects to FDA at 1-800-FDA-1088. Where should I keep my medicine? Keep out of the reach of children. Store at room temperature between 15 and 30 degrees C (59 and 85 degrees F). Protect from light. Throw away any unused medicine after the expiration date. NOTE: This sheet is a summary. It may not cover all possible information. If you have questions about this medicine, talk to your doctor, pharmacist, or   health care provider.  2018 Elsevier/Gold Standard (2007-09-28 22:10:20)  

## 2017-07-02 NOTE — Progress Notes (Signed)
Vitamin b12 injection given and patient tolerated well.  

## 2017-07-03 ENCOUNTER — Ambulatory Visit (INDEPENDENT_AMBULATORY_CARE_PROVIDER_SITE_OTHER): Payer: Medicare Other | Admitting: *Deleted

## 2017-07-03 DIAGNOSIS — E538 Deficiency of other specified B group vitamins: Secondary | ICD-10-CM

## 2017-07-03 NOTE — Progress Notes (Signed)
Pt given Cyanocobalamin inj Tolerated well 

## 2017-07-04 ENCOUNTER — Ambulatory Visit (INDEPENDENT_AMBULATORY_CARE_PROVIDER_SITE_OTHER): Payer: Medicare Other | Admitting: *Deleted

## 2017-07-04 DIAGNOSIS — E538 Deficiency of other specified B group vitamins: Secondary | ICD-10-CM

## 2017-07-04 NOTE — Progress Notes (Signed)
Pt given Cyanocobalamin inj Tolerated well 

## 2017-07-07 ENCOUNTER — Other Ambulatory Visit: Payer: Self-pay | Admitting: Family

## 2017-07-10 ENCOUNTER — Ambulatory Visit (INDEPENDENT_AMBULATORY_CARE_PROVIDER_SITE_OTHER): Payer: Medicare Other | Admitting: *Deleted

## 2017-07-10 DIAGNOSIS — E538 Deficiency of other specified B group vitamins: Secondary | ICD-10-CM | POA: Diagnosis not present

## 2017-07-10 NOTE — Progress Notes (Signed)
Pt given Cyanocobalamin inj Tolerated well 

## 2017-07-17 ENCOUNTER — Ambulatory Visit (INDEPENDENT_AMBULATORY_CARE_PROVIDER_SITE_OTHER): Payer: Medicare Other | Admitting: *Deleted

## 2017-07-17 DIAGNOSIS — E538 Deficiency of other specified B group vitamins: Secondary | ICD-10-CM

## 2017-07-17 NOTE — Progress Notes (Signed)
Cyanocobalamin inj given Pt tolerated well

## 2017-07-18 ENCOUNTER — Telehealth: Payer: Self-pay | Admitting: *Deleted

## 2017-07-18 NOTE — Telephone Encounter (Signed)
Faxed 12/26 records to [place]

## 2017-07-24 ENCOUNTER — Ambulatory Visit (INDEPENDENT_AMBULATORY_CARE_PROVIDER_SITE_OTHER): Payer: Medicare Other | Admitting: *Deleted

## 2017-07-24 DIAGNOSIS — E538 Deficiency of other specified B group vitamins: Secondary | ICD-10-CM

## 2017-07-24 NOTE — Progress Notes (Signed)
Tolerated B12 inj well °

## 2017-07-29 ENCOUNTER — Encounter (HOSPITAL_COMMUNITY): Payer: Self-pay | Admitting: *Deleted

## 2017-07-29 ENCOUNTER — Encounter (HOSPITAL_COMMUNITY): Payer: Self-pay

## 2017-07-29 NOTE — Patient Instructions (Addendum)
Alexandria Irwin  07/29/2017   Your procedure is scheduled on: 08/05/2017   Report to [facility] Main  Entrance  Report to admitting at   0900 AM   Call this number if you have problems the morning of surgery [phone]   Remember: Do not eat food or drink liquids :After Midnight. Eat a light diet the day before surgery.  Examples include: soups, broth, toast, yogurt and mashed potatoes.  Avoid carbonated beverages, raw fruits and vegetables and beans.      Take these medicines the morning of surgery with A SIP OF WATER: , Clonidine ( catapres), eye drops as usual, Synthroid, Metoprolol ( Toprol) DO NOT TAKE ANY DIABETIC MEDICATIONS DAY OF YOUR SURGERY                               You may not have any metal on your body including hair pins and              piercings  Do not wear jewelry, make-up, lotions, powders or perfumes, deodorant             Do not wear nail polish.  Do not shave  48 hours prior to surgery.     Do not bring valuables to the hospital. [place].  Contacts, dentures or bridgework may not be worn into surgery.  Leave suitcase in the car. After surgery it may be brought to your room.         Special Instructions: coughing and deep breathing exercises, leg exercises               Please read over the following fact sheets you were given: _____________________________________________________________________             [facility] - Preparing for Surgery Before surgery, you can play an important role.  Because skin is not sterile, your skin needs to be as free of germs as possible.  You can reduce the number of germs on your skin by washing with CHG (chlorahexidine gluconate) soap before surgery.  CHG is an antiseptic cleaner which kills germs and bonds with the skin to continue killing germs even after washing. Please DO NOT use if you have an allergy to CHG or antibacterial soaps.  If your  skin becomes reddened/irritated stop using the CHG and inform your nurse when you arrive at Short Stay. Do not shave (including legs and underarms) for at least 48 hours prior to the first CHG shower.  You may shave your face/neck. Please follow these instructions carefully:  1.  Shower with CHG Soap the night before surgery and the  morning of Surgery.  2.  If you choose to wash your hair, wash your hair first as usual with your  normal  shampoo.  3.  After you shampoo, rinse your hair and body thoroughly to remove the  shampoo.                           4.  Use CHG as you would any other liquid soap.  You can apply chg directly  to the skin and wash  Gently with a scrungie or clean washcloth.  5.  Apply the CHG Soap to your body ONLY FROM THE NECK DOWN.   Do not use on face/ open                           Wound or open sores. Avoid contact with eyes, ears mouth and genitals (private parts).                       Wash face,  Genitals (private parts) with your normal soap.             6.  Wash thoroughly, paying special attention to the area where your surgery  will be performed.  7.  Thoroughly rinse your body with warm water from the neck down.  8.  DO NOT shower/wash with your normal soap after using and rinsing off  the CHG Soap.                9.  Pat yourself dry with a clean towel.            10.  Wear clean pajamas.            11.  Place clean sheets on your bed the night of your first shower and do not  sleep with pets. Day of Surgery : Do not apply any lotions/deodorants the morning of surgery.  Please wear clean clothes to the hospital/surgery center.  FAILURE TO FOLLOW THESE INSTRUCTIONS MAY RESULT IN THE CANCELLATION OF YOUR SURGERY PATIENT SIGNATURE_________________________________  NURSE SIGNATURE__________________________________  ________________________________________________________________________  WHAT IS A BLOOD TRANSFUSION? Blood Transfusion  Information  A transfusion is the replacement of blood or some of its parts. Blood is made up of multiple cells which provide different functions.  Red blood cells carry oxygen and are used for blood loss replacement.  White blood cells fight against infection.  Platelets control bleeding.  Plasma helps clot blood.  Other blood products are available for specialized needs, such as hemophilia or other clotting disorders. BEFORE THE TRANSFUSION  Who gives blood for transfusions?   Healthy volunteers who are fully evaluated to make sure their blood is safe. This is blood bank blood. Transfusion therapy is the safest it has ever been in the practice of medicine. Before blood is taken from a donor, a complete history is taken to make sure that person has no history of diseases nor engages in risky social behavior (examples are intravenous drug use or sexual activity with multiple partners). The donor's travel history is screened to minimize risk of transmitting infections, such as malaria. The donated blood is tested for signs of infectious diseases, such as HIV and hepatitis. The blood is then tested to be sure it is compatible with you in order to minimize the chance of a transfusion reaction. If you or a relative donates blood, this is often done in anticipation of surgery and is not appropriate for emergency situations. It takes many days to process the donated blood. RISKS AND COMPLICATIONS Although transfusion therapy is very safe and saves many lives, the main dangers of transfusion include:   Getting an infectious disease.  Developing a transfusion reaction. This is an allergic reaction to something in the blood you were given. Every precaution is taken to prevent this. The decision to have a blood transfusion has been considered carefully by your caregiver before blood is given. Blood is not given unless the benefits outweigh  the risks. AFTER THE TRANSFUSION  Right after receiving a  blood transfusion, you will usually feel much better and more energetic. This is especially true if your red blood cells have gotten low (anemic). The transfusion raises the level of the red blood cells which carry oxygen, and this usually causes an energy increase.  The nurse administering the transfusion will monitor you carefully for complications. HOME CARE INSTRUCTIONS  No special instructions are needed after a transfusion. You may find your energy is better. Speak with your caregiver about any limitations on activity for underlying diseases you may have. SEEK MEDICAL CARE IF:   Your condition is not improving after your transfusion.  You develop redness or irritation at the intravenous (IV) site. SEEK IMMEDIATE MEDICAL CARE IF:  Any of the following symptoms occur over the next 12 hours:  Shaking chills.  You have a temperature by mouth above 102 F (38.9 C), not controlled by medicine.  Chest, back, or muscle pain.  People around you feel you are not acting correctly or are confused.  Shortness of breath or difficulty breathing.  Dizziness and fainting.  You get a rash or develop hives.  You have a decrease in urine output.  Your urine turns a dark color or changes to pink, red, or brown. Any of the following symptoms occur over the next 10 days:  You have a temperature by mouth above 102 F (38.9 C), not controlled by medicine.  Shortness of breath.  Weakness after normal activity.  The white part of the eye turns yellow (jaundice).  You have a decrease in the amount of urine or are urinating less often.  Your urine turns a dark color or changes to pink, red, or brown. Document Released: 06/14/2000 Document Revised: 09/09/2011 Document Reviewed: 02/01/2008 ExitCare Patient Information 2014 [place].  _______________________________________________________________________  Incentive Spirometer  An incentive spirometer is a tool that can help keep  your lungs clear and active. This tool measures how well you are filling your lungs with each breath. Taking long deep breaths may help reverse or decrease the chance of developing breathing (pulmonary) problems (especially infection) following:  A long period of time when you are unable to move or be active. BEFORE THE PROCEDURE   If the spirometer includes an indicator to show your best effort, your nurse or respiratory therapist will set it to a desired goal.  If possible, sit up straight or lean slightly forward. Try not to slouch.  Hold the incentive spirometer in an upright position. INSTRUCTIONS FOR USE  1. Sit on the edge of your bed if possible, or sit up as far as you can in bed or on a chair. 2. Hold the incentive spirometer in an upright position. 3. Breathe out normally. 4. Place the mouthpiece in your mouth and seal your lips tightly around it. 5. Breathe in slowly and as deeply as possible, raising the piston or the ball toward the top of the column. 6. Hold your breath for 3-5 seconds or for as long as possible. Allow the piston or ball to fall to the bottom of the column. 7. Remove the mouthpiece from your mouth and breathe out normally. 8. Rest for a few seconds and repeat Steps 1 through 7 at least 10 times every 1-2 hours when you are awake. Take your time and take a few normal breaths between deep breaths. 9. The spirometer may include an indicator to show your best effort. Use the indicator as a goal to work  toward during each repetition. 10. After each set of 10 deep breaths, practice coughing to be sure your lungs are clear. If you have an incision (the cut made at the time of surgery), support your incision when coughing by placing a pillow or rolled up towels firmly against it. Once you are able to get out of bed, walk around indoors and cough well. You may stop using the incentive spirometer when instructed by your caregiver.  RISKS AND COMPLICATIONS  Take your time  so you do not get dizzy or light-headed.  If you are in pain, you may need to take or ask for pain medication before doing incentive spirometry. It is harder to take a deep breath if you are having pain. AFTER USE  Rest and breathe slowly and easily.  It can be helpful to keep track of a log of your progress. Your caregiver can provide you with a simple table to help with this. If you are using the spirometer at home, follow these instructions: [place] IF:   You are having difficultly using the spirometer.  You have trouble using the spirometer as often as instructed.  Your pain medication is not giving enough relief while using the spirometer.  You develop fever of 100.5 F (38.1 C) or higher. SEEK IMMEDIATE MEDICAL CARE IF:   You cough up bloody sputum that had not been present before.  You develop fever of 102 F (38.9 C) or greater.  You develop worsening pain at or near the incision site. MAKE SURE YOU:   Understand these instructions.  Will watch your condition.  Will get help right away if you are not doing well or get worse. Document Released: 10/28/2006 Document Revised: 09/09/2011 Document Reviewed: 12/29/2006 [facility] Patient Information 2014 [place], [place].   ________________________________________________________________________

## 2017-07-30 ENCOUNTER — Encounter (HOSPITAL_COMMUNITY): Payer: Self-pay

## 2017-07-30 ENCOUNTER — Other Ambulatory Visit: Payer: Self-pay

## 2017-07-30 ENCOUNTER — Encounter (HOSPITAL_COMMUNITY)
Admission: RE | Admit: 2017-07-30 | Discharge: 2017-07-30 | Disposition: A | Discharge status: Home / Self Care | Payer: Medicare Other | Source: Ambulatory Visit | Attending: Gynecologic Oncology | Admitting: Gynecologic Oncology

## 2017-07-30 DIAGNOSIS — Z7984 Long term (current) use of oral hypoglycemic drugs: Secondary | ICD-10-CM | POA: Insufficient documentation

## 2017-07-30 DIAGNOSIS — Z86718 Personal history of other venous thrombosis and embolism: Secondary | ICD-10-CM | POA: Insufficient documentation

## 2017-07-30 DIAGNOSIS — K219 Gastro-esophageal reflux disease without esophagitis: Secondary | ICD-10-CM | POA: Insufficient documentation

## 2017-07-30 DIAGNOSIS — Z01818 Encounter for other preprocedural examination: Secondary | ICD-10-CM | POA: Insufficient documentation

## 2017-07-30 DIAGNOSIS — Z7982 Long term (current) use of aspirin: Secondary | ICD-10-CM | POA: Insufficient documentation

## 2017-07-30 DIAGNOSIS — Z79890 Hormone replacement therapy (postmenopausal): Secondary | ICD-10-CM | POA: Diagnosis not present

## 2017-07-30 DIAGNOSIS — K429 Umbilical hernia without obstruction or gangrene: Secondary | ICD-10-CM | POA: Insufficient documentation

## 2017-07-30 DIAGNOSIS — E039 Hypothyroidism, unspecified: Secondary | ICD-10-CM | POA: Diagnosis not present

## 2017-07-30 DIAGNOSIS — I10 Essential (primary) hypertension: Secondary | ICD-10-CM | POA: Insufficient documentation

## 2017-07-30 DIAGNOSIS — E538 Deficiency of other specified B group vitamins: Secondary | ICD-10-CM | POA: Diagnosis not present

## 2017-07-30 DIAGNOSIS — E1165 Type 2 diabetes mellitus with hyperglycemia: Secondary | ICD-10-CM | POA: Diagnosis not present

## 2017-07-30 DIAGNOSIS — Z79899 Other long term (current) drug therapy: Secondary | ICD-10-CM | POA: Insufficient documentation

## 2017-07-30 DIAGNOSIS — E559 Vitamin D deficiency, unspecified: Secondary | ICD-10-CM | POA: Diagnosis not present

## 2017-07-30 DIAGNOSIS — E7800 Pure hypercholesterolemia, unspecified: Secondary | ICD-10-CM | POA: Diagnosis not present

## 2017-07-30 DIAGNOSIS — C541 Malignant neoplasm of endometrium: Secondary | ICD-10-CM | POA: Insufficient documentation

## 2017-07-30 HISTORY — DX: Other allergic rhinitis: J30.89

## 2017-07-30 HISTORY — DX: Presence of spectacles and contact lenses: Z97.3

## 2017-07-30 HISTORY — DX: Personal history of other venous thrombosis and embolism: Z86.718

## 2017-07-30 HISTORY — DX: Type 2 diabetes mellitus without complications: E11.9

## 2017-07-30 LAB — CBC
HCT: 37.4 % (ref 36.0–46.0)
Hemoglobin: 12.5 g/dL (ref 12.0–15.0)
MCH: 30.3 pg (ref 26.0–34.0)
MCHC: 33.4 g/dL (ref 30.0–36.0)
MCV: 90.6 fL (ref 78.0–100.0)
PLATELETS: 247 10*3/uL (ref 150–400)
RBC: 4.13 MIL/uL (ref 3.87–5.11)
RDW: 13.3 % (ref 11.5–15.5)
WBC: 8.5 10*3/uL (ref 4.0–10.5)

## 2017-07-30 LAB — HEMOGLOBIN A1C
HEMOGLOBIN A1C: 7.2 % — AB (ref 4.8–5.6)
Mean Plasma Glucose: 159.94 mg/dL

## 2017-07-30 LAB — COMPREHENSIVE METABOLIC PANEL WITH GFR
ALT: 19 U/L (ref 14–54)
AST: 27 U/L (ref 15–41)
Albumin: 4 g/dL (ref 3.5–5.0)
Alkaline Phosphatase: 42 U/L (ref 38–126)
Anion gap: 9 (ref 5–15)
BUN: 15 mg/dL (ref 6–20)
CO2: 29 mmol/L (ref 22–32)
Calcium: 10 mg/dL (ref 8.9–10.3)
Chloride: 100 mmol/L — ABNORMAL LOW (ref 101–111)
Creatinine, Ser: 0.76 mg/dL (ref 0.44–1.00)
GFR calc Af Amer: >60 mL/min
GFR calc non Af Amer: >60 mL/min
Glucose, Bld: 152 mg/dL — ABNORMAL HIGH (ref 65–99)
Potassium: 4.6 mmol/L (ref 3.5–5.1)
Sodium: 138 mmol/L (ref 135–145)
Total Bilirubin: 0.5 mg/dL (ref 0.3–1.2)
Total Protein: 7.5 g/dL (ref 6.5–8.1)

## 2017-07-30 LAB — URINALYSIS, ROUTINE W REFLEX MICROSCOPIC
Bilirubin Urine: NEGATIVE
Glucose, UA: NEGATIVE mg/dL
Hgb urine dipstick: NEGATIVE
Ketones, ur: NEGATIVE mg/dL
Nitrite: NEGATIVE
Protein, ur: NEGATIVE mg/dL
Specific Gravity, Urine: 1.013 (ref 1.005–1.030)
pH: 8 (ref 5.0–8.0)

## 2017-07-30 LAB — ABO/RH: ABO/RH(D): A POS

## 2017-07-30 LAB — GLUCOSE, CAPILLARY: GLUCOSE-CAPILLARY: 164 mg/dL — AB (ref 65–99)

## 2017-07-30 NOTE — Progress Notes (Signed)
CURRENT EKG DATED 06-05-2017 IN Epic.  ROUTED UA RESULTS TO DR Denman George AND MELISSA CROSS NP.

## 2017-07-31 ENCOUNTER — Telehealth: Payer: Self-pay | Admitting: *Deleted

## 2017-07-31 LAB — URINE CULTURE: Culture: <10000 — AB

## 2017-07-31 NOTE — Telephone Encounter (Signed)
Called the patient and moved the appt form February 22nd to February 20th. Patient aware

## 2017-08-05 ENCOUNTER — Encounter (HOSPITAL_COMMUNITY): Payer: Self-pay | Admitting: Certified Registered Nurse Anesthetist

## 2017-08-05 ENCOUNTER — Encounter (HOSPITAL_COMMUNITY)
Admission: RE | Disposition: A | Discharge status: Home / Self Care | Payer: Self-pay | Source: Ambulatory Visit | Attending: Gynecologic Oncology

## 2017-08-05 ENCOUNTER — Other Ambulatory Visit (HOSPITAL_COMMUNITY)
Admission: RE | Admit: 2017-08-05 | Discharge: 2017-08-05 | Disposition: A | Discharge status: Home / Self Care | Payer: Medicare Other | Source: Ambulatory Visit | Attending: Hematology and Oncology | Admitting: Hematology and Oncology

## 2017-08-05 ENCOUNTER — Other Ambulatory Visit: Payer: Self-pay

## 2017-08-05 ENCOUNTER — Ambulatory Visit (HOSPITAL_COMMUNITY): Payer: Medicare Other | Admitting: Certified Registered Nurse Anesthetist

## 2017-08-05 ENCOUNTER — Ambulatory Visit (HOSPITAL_COMMUNITY)
Admission: RE | Admit: 2017-08-05 | Discharge: 2017-08-06 | Disposition: A | Discharge status: Home / Self Care | Payer: Medicare Other | Source: Ambulatory Visit | Attending: Gynecologic Oncology | Admitting: Gynecologic Oncology

## 2017-08-05 DIAGNOSIS — E039 Hypothyroidism, unspecified: Secondary | ICD-10-CM | POA: Diagnosis not present

## 2017-08-05 DIAGNOSIS — E7800 Pure hypercholesterolemia, unspecified: Secondary | ICD-10-CM | POA: Diagnosis not present

## 2017-08-05 DIAGNOSIS — Z6830 Body mass index (BMI) 30.0-30.9, adult: Secondary | ICD-10-CM | POA: Insufficient documentation

## 2017-08-05 DIAGNOSIS — N736 Female pelvic peritoneal adhesions (postinfective): Secondary | ICD-10-CM | POA: Insufficient documentation

## 2017-08-05 DIAGNOSIS — Z881 Allergy status to other antibiotic agents status: Secondary | ICD-10-CM | POA: Diagnosis not present

## 2017-08-05 DIAGNOSIS — K429 Umbilical hernia without obstruction or gangrene: Secondary | ICD-10-CM | POA: Diagnosis not present

## 2017-08-05 DIAGNOSIS — E1151 Type 2 diabetes mellitus with diabetic peripheral angiopathy without gangrene: Secondary | ICD-10-CM | POA: Insufficient documentation

## 2017-08-05 DIAGNOSIS — Z7984 Long term (current) use of oral hypoglycemic drugs: Secondary | ICD-10-CM | POA: Diagnosis not present

## 2017-08-05 DIAGNOSIS — Z882 Allergy status to sulfonamides status: Secondary | ICD-10-CM | POA: Diagnosis not present

## 2017-08-05 DIAGNOSIS — E669 Obesity, unspecified: Secondary | ICD-10-CM | POA: Insufficient documentation

## 2017-08-05 DIAGNOSIS — Z86718 Personal history of other venous thrombosis and embolism: Secondary | ICD-10-CM | POA: Insufficient documentation

## 2017-08-05 DIAGNOSIS — C542 Malignant neoplasm of myometrium: Secondary | ICD-10-CM | POA: Diagnosis not present

## 2017-08-05 DIAGNOSIS — Z888 Allergy status to other drugs, medicaments and biological substances status: Secondary | ICD-10-CM | POA: Insufficient documentation

## 2017-08-05 DIAGNOSIS — Z79899 Other long term (current) drug therapy: Secondary | ICD-10-CM | POA: Diagnosis not present

## 2017-08-05 DIAGNOSIS — Z9841 Cataract extraction status, right eye: Secondary | ICD-10-CM | POA: Insufficient documentation

## 2017-08-05 DIAGNOSIS — N888 Other specified noninflammatory disorders of cervix uteri: Secondary | ICD-10-CM | POA: Diagnosis not present

## 2017-08-05 DIAGNOSIS — C541 Malignant neoplasm of endometrium: Secondary | ICD-10-CM | POA: Diagnosis not present

## 2017-08-05 DIAGNOSIS — Z7951 Long term (current) use of inhaled steroids: Secondary | ICD-10-CM | POA: Insufficient documentation

## 2017-08-05 DIAGNOSIS — E559 Vitamin D deficiency, unspecified: Secondary | ICD-10-CM | POA: Diagnosis not present

## 2017-08-05 DIAGNOSIS — Z91040 Latex allergy status: Secondary | ICD-10-CM | POA: Diagnosis not present

## 2017-08-05 DIAGNOSIS — Z8041 Family history of malignant neoplasm of ovary: Secondary | ICD-10-CM | POA: Insufficient documentation

## 2017-08-05 DIAGNOSIS — C7982 Secondary malignant neoplasm of genital organs: Secondary | ICD-10-CM | POA: Diagnosis not present

## 2017-08-05 DIAGNOSIS — E538 Deficiency of other specified B group vitamins: Secondary | ICD-10-CM | POA: Insufficient documentation

## 2017-08-05 DIAGNOSIS — E119 Type 2 diabetes mellitus without complications: Secondary | ICD-10-CM | POA: Diagnosis not present

## 2017-08-05 DIAGNOSIS — K219 Gastro-esophageal reflux disease without esophagitis: Secondary | ICD-10-CM | POA: Insufficient documentation

## 2017-08-05 DIAGNOSIS — Z7982 Long term (current) use of aspirin: Secondary | ICD-10-CM | POA: Insufficient documentation

## 2017-08-05 DIAGNOSIS — N950 Postmenopausal bleeding: Secondary | ICD-10-CM | POA: Insufficient documentation

## 2017-08-05 DIAGNOSIS — I10 Essential (primary) hypertension: Secondary | ICD-10-CM | POA: Insufficient documentation

## 2017-08-05 DIAGNOSIS — K5730 Diverticulosis of large intestine without perforation or abscess without bleeding: Secondary | ICD-10-CM | POA: Diagnosis not present

## 2017-08-05 DIAGNOSIS — D259 Leiomyoma of uterus, unspecified: Secondary | ICD-10-CM | POA: Diagnosis not present

## 2017-08-05 HISTORY — DX: Malignant neoplasm of endometrium: C54.1

## 2017-08-05 HISTORY — DX: Umbilical hernia without obstruction or gangrene: K42.9

## 2017-08-05 HISTORY — PX: UMBILICAL HERNIA REPAIR: SHX196

## 2017-08-05 HISTORY — PX: ROBOTIC ASSISTED TOTAL HYSTERECTOMY WITH BILATERAL SALPINGO OOPHERECTOMY: SHX6086

## 2017-08-05 HISTORY — PX: SENTINEL NODE BIOPSY: SHX6608

## 2017-08-05 LAB — GLUCOSE, CAPILLARY
GLUCOSE-CAPILLARY: 154 mg/dL — AB (ref 65–99)
GLUCOSE-CAPILLARY: 152 mg/dL — AB (ref 65–99)
GLUCOSE-CAPILLARY: 165 mg/dL — AB (ref 65–99)
Glucose-Capillary: 173 mg/dL — ABNORMAL HIGH (ref 65–99)

## 2017-08-05 LAB — TYPE AND SCREEN
ABO/RH(D): A POS
ANTIBODY SCREEN: NEGATIVE

## 2017-08-05 SURGERY — ROBOTIC ASSISTED TOTAL HYSTERECTOMY WITH BILATERAL SALPINGO OOPHORECTOMY
Anesthesia: General

## 2017-08-05 MED ORDER — LIP MEDEX EX OINT
TOPICAL_OINTMENT | CUTANEOUS | Status: AC
  Filled 2017-08-05: qty 7

## 2017-08-05 MED ORDER — SUGAMMADEX SODIUM 200 MG/2ML IV SOLN
INTRAVENOUS | Status: DC | PRN
  Administered 2017-08-05: 200 mg via INTRAVENOUS

## 2017-08-05 MED ORDER — INDOCYANINE GREEN 25 MG IV SOLR
INTRAVENOUS | Status: DC | PRN
  Administered 2017-08-05: 2.5 mg

## 2017-08-05 MED ORDER — SUGAMMADEX SODIUM 200 MG/2ML IV SOLN
INTRAVENOUS | Status: AC
  Filled 2017-08-05: qty 2

## 2017-08-05 MED ORDER — HYDROMORPHONE HCL 1 MG/ML IJ SOLN: 0.2000 mg | INTRAMUSCULAR | Status: DC | PRN

## 2017-08-05 MED ORDER — FENTANYL CITRATE (PF) 250 MCG/5ML IJ SOLN
INTRAMUSCULAR | Status: AC
  Filled 2017-08-05: qty 5

## 2017-08-05 MED ORDER — HYDROMORPHONE HCL 1 MG/ML IJ SOLN
INTRAMUSCULAR | Status: AC
  Filled 2017-08-05: qty 1

## 2017-08-05 MED ORDER — TRAMADOL HCL 50 MG PO TABS: 100.0000 mg | ORAL_TABLET | Freq: Two times a day (BID) | ORAL | Status: DC | PRN

## 2017-08-05 MED ORDER — ENOXAPARIN SODIUM 40 MG/0.4ML ~~LOC~~ SOLN
40.0000 mg | SUBCUTANEOUS | Status: AC
  Administered 2017-08-05: 40 mg via SUBCUTANEOUS
  Filled 2017-08-05: qty 0.4

## 2017-08-05 MED ORDER — ROCURONIUM BROMIDE 10 MG/ML (PF) SYRINGE
PREFILLED_SYRINGE | INTRAVENOUS | Status: AC
  Filled 2017-08-05: qty 5

## 2017-08-05 MED ORDER — SCOPOLAMINE 1 MG/3DAYS TD PT72
1.0000 | MEDICATED_PATCH | TRANSDERMAL | Status: DC
  Administered 2017-08-05: 1.5 mg via TRANSDERMAL
  Filled 2017-08-05: qty 1

## 2017-08-05 MED ORDER — ROCURONIUM BROMIDE 10 MG/ML (PF) SYRINGE
PREFILLED_SYRINGE | INTRAVENOUS | Status: DC | PRN
  Administered 2017-08-05: 10 mg via INTRAVENOUS
  Administered 2017-08-05: 60 mg via INTRAVENOUS
  Administered 2017-08-05: 10 mg via INTRAVENOUS

## 2017-08-05 MED ORDER — SENNOSIDES-DOCUSATE SODIUM 8.6-50 MG PO TABS
2.0000 | ORAL_TABLET | Freq: Every day | ORAL | Status: DC
  Administered 2017-08-05: 21:00:00 2 via ORAL
  Filled 2017-08-05: qty 2

## 2017-08-05 MED ORDER — EPHEDRINE 5 MG/ML INJ
INTRAVENOUS | Status: AC
  Filled 2017-08-05: qty 10

## 2017-08-05 MED ORDER — IBUPROFEN 400 MG PO TABS
800.0000 mg | ORAL_TABLET | Freq: Four times a day (QID) | ORAL | Status: DC
  Administered 2017-08-06: 800 mg via ORAL
  Filled 2017-08-05: qty 2

## 2017-08-05 MED ORDER — INSULIN GLARGINE 100 UNIT/ML ~~LOC~~ SOLN
6.0000 [IU] | Freq: Every day | SUBCUTANEOUS | Status: DC
  Administered 2017-08-05: 22:00:00 6 [IU] via SUBCUTANEOUS
  Filled 2017-08-05 (×2): qty 0.06

## 2017-08-05 MED ORDER — CEFAZOLIN SODIUM-DEXTROSE 2-4 GM/100ML-% IV SOLN
2.0000 g | INTRAVENOUS | Status: AC
  Administered 2017-08-05: 2 g via INTRAVENOUS
  Filled 2017-08-05: qty 100

## 2017-08-05 MED ORDER — METOPROLOL SUCCINATE ER 50 MG PO TB24
50.0000 mg | ORAL_TABLET | Freq: Every day | ORAL | Status: DC
  Administered 2017-08-06: 10:00:00 50 mg via ORAL
  Filled 2017-08-05: qty 1

## 2017-08-05 MED ORDER — STERILE WATER FOR INJECTION IJ SOLN
INTRAMUSCULAR | Status: DC | PRN
  Administered 2017-08-05: 4 mL

## 2017-08-05 MED ORDER — CLONIDINE HCL 0.1 MG PO TABS
0.1000 mg | ORAL_TABLET | Freq: Two times a day (BID) | ORAL | Status: DC
  Administered 2017-08-05: 0.1 mg via ORAL
  Filled 2017-08-05 (×2): qty 1

## 2017-08-05 MED ORDER — PROPOFOL 10 MG/ML IV BOLUS
INTRAVENOUS | Status: AC
Start: 2017-08-05 — End: 2017-08-05
  Filled 2017-08-05: qty 20

## 2017-08-05 MED ORDER — LIDOCAINE 2% (20 MG/ML) 5 ML SYRINGE
INTRAMUSCULAR | Status: AC
  Filled 2017-08-05: qty 5

## 2017-08-05 MED ORDER — FENTANYL CITRATE (PF) 100 MCG/2ML IJ SOLN
INTRAMUSCULAR | Status: DC | PRN
  Administered 2017-08-05 (×2): 50 ug via INTRAVENOUS
  Administered 2017-08-05: 25 ug via INTRAVENOUS
  Administered 2017-08-05: 50 ug via INTRAVENOUS
  Administered 2017-08-05: 75 ug via INTRAVENOUS

## 2017-08-05 MED ORDER — ALBUTEROL SULFATE (2.5 MG/3ML) 0.083% IN NEBU: 3.0000 mL | INHALATION_SOLUTION | Freq: Four times a day (QID) | RESPIRATORY_TRACT | Status: DC | PRN

## 2017-08-05 MED ORDER — ONDANSETRON HCL 4 MG PO TABS: 4.0000 mg | ORAL_TABLET | Freq: Four times a day (QID) | ORAL | Status: DC | PRN

## 2017-08-05 MED ORDER — ONDANSETRON HCL 4 MG/2ML IJ SOLN: 4.0000 mg | Freq: Four times a day (QID) | INTRAMUSCULAR | Status: DC | PRN

## 2017-08-05 MED ORDER — ONDANSETRON HCL 4 MG/2ML IJ SOLN: 4.0000 mg | Freq: Once | INTRAMUSCULAR | Status: DC | PRN

## 2017-08-05 MED ORDER — OXYCODONE HCL 5 MG/5ML PO SOLN
5.0000 mg | Freq: Once | ORAL | Status: DC | PRN
  Filled 2017-08-05: qty 5

## 2017-08-05 MED ORDER — DEXAMETHASONE SODIUM PHOSPHATE 10 MG/ML IJ SOLN
INTRAMUSCULAR | Status: AC
  Filled 2017-08-05: qty 1

## 2017-08-05 MED ORDER — STERILE WATER FOR INJECTION IJ SOLN
INTRAMUSCULAR | Status: AC
  Filled 2017-08-05: qty 10

## 2017-08-05 MED ORDER — LEVOTHYROXINE SODIUM 100 MCG PO TABS
100.0000 ug | ORAL_TABLET | Freq: Every day | ORAL | Status: DC
  Administered 2017-08-06: 100 ug via ORAL
  Filled 2017-08-05: qty 1

## 2017-08-05 MED ORDER — ACETAMINOPHEN 500 MG PO TABS
1000.0000 mg | ORAL_TABLET | ORAL | Status: AC
  Administered 2017-08-05: 1000 mg via ORAL
  Filled 2017-08-05: qty 2

## 2017-08-05 MED ORDER — LACTATED RINGERS IV SOLN
INTRAVENOUS | Status: DC
  Administered 2017-08-05 (×2): via INTRAVENOUS

## 2017-08-05 MED ORDER — LIDOCAINE 2% (20 MG/ML) 5 ML SYRINGE
INTRAMUSCULAR | Status: DC | PRN
  Administered 2017-08-05: 40 mg via INTRAVENOUS
  Administered 2017-08-05: 60 mg via INTRAVENOUS

## 2017-08-05 MED ORDER — PHENYLEPHRINE 40 MCG/ML (10ML) SYRINGE FOR IV PUSH (FOR BLOOD PRESSURE SUPPORT)
PREFILLED_SYRINGE | INTRAVENOUS | Status: DC | PRN
  Administered 2017-08-05: 80 ug via INTRAVENOUS

## 2017-08-05 MED ORDER — EPHEDRINE SULFATE-NACL 50-0.9 MG/10ML-% IV SOSY
PREFILLED_SYRINGE | INTRAVENOUS | Status: DC | PRN
  Administered 2017-08-05: 5 mg via INTRAVENOUS

## 2017-08-05 MED ORDER — GLYCOPYRROLATE 0.2 MG/ML IJ SOLN
INTRAMUSCULAR | Status: DC | PRN
  Administered 2017-08-05: .2 mg via INTRAVENOUS

## 2017-08-05 MED ORDER — LOSARTAN POTASSIUM 50 MG PO TABS
100.0000 mg | ORAL_TABLET | Freq: Every day | ORAL | Status: DC
  Filled 2017-08-05: qty 2

## 2017-08-05 MED ORDER — OXYCODONE HCL 5 MG PO TABS: 5.0000 mg | ORAL_TABLET | ORAL | Status: DC | PRN

## 2017-08-05 MED ORDER — SIMVASTATIN 20 MG PO TABS
40.0000 mg | ORAL_TABLET | Freq: Every evening | ORAL | Status: DC
  Administered 2017-08-05: 18:00:00 40 mg via ORAL
  Filled 2017-08-05: qty 2

## 2017-08-05 MED ORDER — KCL IN DEXTROSE-NACL 20-5-0.45 MEQ/L-%-% IV SOLN
INTRAVENOUS | Status: DC
  Filled 2017-08-05: qty 1000

## 2017-08-05 MED ORDER — OXYCODONE HCL 5 MG PO TABS: 5.0000 mg | ORAL_TABLET | Freq: Once | ORAL | Status: DC | PRN

## 2017-08-05 MED ORDER — ONDANSETRON HCL 4 MG/2ML IJ SOLN
INTRAMUSCULAR | Status: AC
  Filled 2017-08-05: qty 2

## 2017-08-05 MED ORDER — DEXAMETHASONE SODIUM PHOSPHATE 4 MG/ML IJ SOLN
4.0000 mg | INTRAMUSCULAR | Status: AC
  Administered 2017-08-05: 4 mg via INTRAVENOUS

## 2017-08-05 MED ORDER — GABAPENTIN 300 MG PO CAPS
300.0000 mg | ORAL_CAPSULE | Freq: Every day | ORAL | Status: AC
  Administered 2017-08-05: 300 mg via ORAL
  Filled 2017-08-05: qty 1

## 2017-08-05 MED ORDER — FENTANYL CITRATE (PF) 100 MCG/2ML IJ SOLN: 25.0000 ug | INTRAMUSCULAR | Status: DC | PRN

## 2017-08-05 MED ORDER — INSULIN ASPART 100 UNIT/ML ~~LOC~~ SOLN
0.0000 [IU] | Freq: Three times a day (TID) | SUBCUTANEOUS | Status: DC
  Administered 2017-08-05: 18:00:00 3 [IU] via SUBCUTANEOUS
  Administered 2017-08-06: 08:00:00 2 [IU] via SUBCUTANEOUS

## 2017-08-05 MED ORDER — ENOXAPARIN SODIUM 40 MG/0.4ML ~~LOC~~ SOLN
40.0000 mg | SUBCUTANEOUS | Status: DC
  Administered 2017-08-06: 40 mg via SUBCUTANEOUS
  Filled 2017-08-05: qty 0.4

## 2017-08-05 MED ORDER — PROMETHAZINE HCL 25 MG/ML IJ SOLN: 6.2500 mg | INTRAMUSCULAR | Status: DC | PRN

## 2017-08-05 MED ORDER — ONDANSETRON HCL 4 MG/2ML IJ SOLN
INTRAMUSCULAR | Status: DC | PRN
  Administered 2017-08-05: 4 mg via INTRAVENOUS

## 2017-08-05 MED ORDER — LACTATED RINGERS IR SOLN
Status: DC | PRN
  Administered 2017-08-05: 1000 mL

## 2017-08-05 MED ORDER — MEPERIDINE HCL 50 MG/ML IJ SOLN: 6.2500 mg | INTRAMUSCULAR | Status: DC | PRN

## 2017-08-05 MED ORDER — ACETAMINOPHEN 500 MG PO TABS
1000.0000 mg | ORAL_TABLET | Freq: Two times a day (BID) | ORAL | Status: DC
  Administered 2017-08-05 – 2017-08-06 (×2): 1000 mg via ORAL
  Filled 2017-08-05 (×2): qty 2

## 2017-08-05 MED ORDER — GABAPENTIN 300 MG PO CAPS
300.0000 mg | ORAL_CAPSULE | ORAL | Status: AC
  Administered 2017-08-05: 300 mg via ORAL
  Filled 2017-08-05: qty 1

## 2017-08-05 MED ORDER — HYDROMORPHONE HCL 1 MG/ML IJ SOLN
0.2500 mg | INTRAMUSCULAR | Status: DC | PRN
  Administered 2017-08-05: 0.25 mg via INTRAVENOUS
  Administered 2017-08-05: 0.5 mg via INTRAVENOUS
  Administered 2017-08-05: 0.25 mg via INTRAVENOUS

## 2017-08-05 MED ORDER — PROPOFOL 10 MG/ML IV BOLUS
INTRAVENOUS | Status: DC | PRN
  Administered 2017-08-05: 50 mg via INTRAVENOUS
  Administered 2017-08-05: 100 mg via INTRAVENOUS
  Administered 2017-08-05: 50 mg via INTRAVENOUS

## 2017-08-05 MED ORDER — ASPIRIN EC 325 MG PO TBEC
325.0000 mg | DELAYED_RELEASE_TABLET | Freq: Every day | ORAL | Status: DC
Start: 2017-08-06 — End: 2017-08-06
  Administered 2017-08-06: 10:00:00 325 mg via ORAL
  Filled 2017-08-05: qty 1

## 2017-08-05 SURGICAL SUPPLY — 51 items
APPLICATOR COTTON TIP 6IN STRL (MISCELLANEOUS) ×3 IMPLANT
APPLICATOR SURGIFLO ENDO (HEMOSTASIS) IMPLANT
BAG LAPAROSCOPIC 12 15 PORT 16 (BASKET) IMPLANT
BAG RETRIEVAL 12/15 (BASKET)
COVER BACK TABLE 60X90IN (DRAPES) ×3 IMPLANT
COVER TIP SHEARS 8 DVNC (MISCELLANEOUS) ×2 IMPLANT
COVER TIP SHEARS 8MM DA VINCI (MISCELLANEOUS) ×1
DERMABOND ADVANCED (GAUZE/BANDAGES/DRESSINGS) ×1
DERMABOND ADVANCED .7 DNX12 (GAUZE/BANDAGES/DRESSINGS) ×2 IMPLANT
DRAPE ARM DVNC X/XI (DISPOSABLE) ×8 IMPLANT
DRAPE COLUMN DVNC XI (DISPOSABLE) ×2 IMPLANT
DRAPE DA VINCI XI ARM (DISPOSABLE) ×4
DRAPE DA VINCI XI COLUMN (DISPOSABLE) ×1
DRAPE SHEET LG 3/4 BI-LAMINATE (DRAPES) ×3 IMPLANT
DRAPE SURG IRRIG POUCH 19X23 (DRAPES) ×3 IMPLANT
ELECT REM PT RETURN 15FT ADLT (MISCELLANEOUS) ×3 IMPLANT
GLOVE BIO SURGEON STRL SZ 6 (GLOVE) IMPLANT
GLOVE BIO SURGEON STRL SZ 6.5 (GLOVE) IMPLANT
GLOVE SURG SS PI 6.0 STRL IVOR (GLOVE) ×12 IMPLANT
GLOVE SURG SS PI 6.5 STRL IVOR (GLOVE) ×6 IMPLANT
GOWN STRL REUS W/ TWL LRG LVL3 (GOWN DISPOSABLE) ×4 IMPLANT
GOWN STRL REUS W/TWL LRG LVL3 (GOWN DISPOSABLE) ×2
HOLDER FOLEY CATH W/STRAP (MISCELLANEOUS) ×3 IMPLANT
IRRIG SUCT STRYKERFLOW 2 WTIP (MISCELLANEOUS) ×3
IRRIGATION SUCT STRKRFLW 2 WTP (MISCELLANEOUS) ×2 IMPLANT
KIT PROCEDURE DA VINCI SI (MISCELLANEOUS) ×1
KIT PROCEDURE DVNC SI (MISCELLANEOUS) ×2 IMPLANT
MANIPULATOR UTERINE 4.5 ZUMI (MISCELLANEOUS) ×3 IMPLANT
NDL SAFETY ECLIPSE 18X1.5 (NEEDLE) ×2 IMPLANT
NEEDLE HYPO 18GX1.5 SHARP (NEEDLE) ×1
NEEDLE SPNL 18GX3.5 QUINCKE PK (NEEDLE) ×3 IMPLANT
OBTURATOR OPTICAL STANDARD 8MM (TROCAR) ×1
OBTURATOR OPTICAL STND 8 DVNC (TROCAR) ×2
OBTURATOR OPTICALSTD 8 DVNC (TROCAR) ×2 IMPLANT
PACK ROBOT GYN CUSTOM WL (TRAY / TRAY PROCEDURE) ×3 IMPLANT
PAD POSITIONING PINK XL (MISCELLANEOUS) ×3 IMPLANT
POUCH SPECIMEN RETRIEVAL 10MM (ENDOMECHANICALS) IMPLANT
SEAL CANN UNIV 5-8 DVNC XI (MISCELLANEOUS) ×8 IMPLANT
SEAL XI 5MM-8MM UNIVERSAL (MISCELLANEOUS) ×4
SET TRI-LUMEN FLTR TB AIRSEAL (TUBING) ×3 IMPLANT
SURGIFLO W/THROMBIN 8M KIT (HEMOSTASIS) IMPLANT
SUT PROLENE 0 CT 1 30 (SUTURE) ×9 IMPLANT
SUT VIC AB 0 CT1 27 (SUTURE)
SUT VIC AB 0 CT1 27XBRD ANTBC (SUTURE) IMPLANT
SYR 10ML LL (SYRINGE) ×3 IMPLANT
TOWEL OR NON WOVEN STRL DISP B (DISPOSABLE) ×3 IMPLANT
TRAP SPECIMEN MUCOUS 40CC (MISCELLANEOUS) IMPLANT
TRAY FOLEY BAG SILVER LF 16FR (SET/KITS/TRAYS/PACK) ×3 IMPLANT
TRAY FOLEY W/METER SILVER 16FR (SET/KITS/TRAYS/PACK) IMPLANT
UNDERPAD 30X30 (UNDERPADS AND DIAPERS) ×3 IMPLANT
WATER STERILE IRR 1000ML POUR (IV SOLUTION) ×3 IMPLANT

## 2017-08-05 NOTE — Anesthesia Postprocedure Evaluation (Signed)
Anesthesia Post Note  Patient: Alexandria Irwin  Procedure(s) Performed: ROBOTIC ASSISTED TOTAL HYSTERECTOMY WITH BILATERAL SALPINGO OOPHORECTOMY (Bilateral ) SENTINEL NODE BIOPSY (Bilateral ) HERNIA REPAIR UMBILICAL ADULT (N/A )     Patient location during evaluation: PACU Anesthesia Type: General Level of consciousness: awake and alert Pain management: pain level controlled Vital Signs Assessment: post-procedure vital signs reviewed and stable Respiratory status: spontaneous breathing, nonlabored ventilation, respiratory function stable and patient connected to nasal cannula oxygen Cardiovascular status: blood pressure returned to baseline and stable Postop Assessment: no apparent nausea or vomiting Anesthetic complications: no    Last Vitals:  Vitals:   08/05/17 1530 08/05/17 1555  BP: 140/77 139/70  Pulse: 64 68  Resp: 15 14  Temp: 36.7 C (!) 36.3 C  SpO2: 100% 99%    Last Pain:  Vitals:   08/05/17 1530  PainSc: 0-No pain                 Tiajuana Amass

## 2017-08-05 NOTE — Op Note (Signed)
OPERATIVE NOTE 08/05/17  Surgeon: Donaciano Eva   Assistants: Dr Lahoma Crocker (an MD assistant was necessary for tissue manipulation, management of robotic instrumentation, retraction and positioning due to the complexity of the case and hospital policies).   Anesthesia: General endotracheal anesthesia  ASA Class: 3   Pre-operative Diagnosis: endometrial cancer grade 1, umbilical hernia  Post-operative Diagnosis: same,   Operation: Robotic-assisted laparoscopic total hysterectomy with bilateral salpingoophorectomy, SLN biopsy, umbilical hernia  Surgeon: Donaciano Eva  Assistant Surgeon: Lahoma Crocker MD  Anesthesia: GET  Urine Output: 200cc  Operative Findings:  : 6cm normal appearing uterus, normal right tube and ovary, left ovary with cystic mass, left sigmoid colon diverticular disease, adhesions to left ovary.  No gross extrauterine disease.  Estimated Blood Loss:  less than 50 mL      Total IV Fluids: 1,000 ml         Specimens: uterus, cervix, bilateral tubes and ovaries, right external iliac SLN, left external SLN 1 and 2.         Complications:  None; patient tolerated the procedure well.         Disposition: PACU - hemodynamically stable.  Procedure Details  The patient was seen in the Holding Room. The risks, benefits, complications, treatment options, and expected outcomes were discussed with the patient.  The patient concurred with the proposed plan, giving informed consent.  The site of surgery properly noted/marked. The patient was identified as Alexandria Irwin and the procedure verified as a Robotic-assisted hysterectomy with bilateral salpingo oophorectomy with SLN biopsy and umbilical hernia repair. A Time Out was held and the above information confirmed.  After induction of anesthesia, the patient was draped and prepped in the usual sterile manner. Pt was placed in supine position after anesthesia and draped and prepped in the usual  sterile manner. The abdominal drape was placed after the CholoraPrep had been allowed to dry for 3 minutes.  Her arms were tucked to her side with all appropriate precautions.  The shoulders were stabilized with padded shoulder blocks applied to the acromium processes.  The patient was placed in the semi-lithotomy position in [place].  The perineum was prepped with Betadine. The patient was then prepped. Foley catheter was placed.  A sterile speculum was placed in the vagina.  The cervix was grasped with a single-tooth tenaculum. 2mg  total of ICG was injected into the cervical stroma at 2 and 9 o'clock with 1cc injected at a 1cm and 94mm depth (concentration 0.5mg /ml) in all locations. The cervix was dilated with Kennon Rounds dilators.  The ZUMI uterine manipulator with a medium colpotomizer ring was placed without difficulty.  A pneum occluder balloon was placed over the manipulator.  OG tube placement was confirmed and to suction.   Next, a 5 mm skin incision was made 1 cm below the subcostal margin in the midclavicular line.  The 5 mm Optiview port and scope was used for direct entry.  Opening pressure was under 10 mm CO2.  The abdomen was insufflated and the findings were noted as above.   At this point and all points during the procedure, the patient's intra-abdominal pressure did not exceed 15 mmHg. Next, a 10 mm skin incision was made in the umbilicus and a right and left port was placed about 10 cm lateral to the robot port on the right and left side.  A fourth arm was placed in the left lower quadrant 2 cm above and superior and medial to the anterior  superior iliac spine.  All ports were placed under direct visualization.  The patient was placed in steep Trendelenburg. Sharp adhesiolysis took down the adhesions from the omentum to the umbilical site and upper abdomen. Bowel was folded away into the upper abdomen.  The robot was docked in the normal manner.  The right and left peritoneum were opened  parallel to the IP ligament to open the retroperitoneal spaces bilaterally. The SLN mapping was performed in bilateral pelvic basins. The para rectal and paravesical spaces were opened up entirely with careful dissection below the level of the ureters bilaterally and to the depth of the uterine artery origin in order to skeletonize the uterine "web" and ensure visualization of all parametrial channels. The para-aortic basins were carefully exposed and evaluated for isolated para-aortic SLN's. Lymphatic channels were identified travelling to the following visualized sentinel lymph node's: right external iliac SLN and left external iliac SLN 1 and 2. These SLN's were separated from their surrounding lymphatic tissue, removed and sent for permanent pathology.  The hysterectomy was started after the round ligament on the right side was incised and the retroperitoneum was entered and the pararectal space was developed.  The ureter was noted to be on the medial leaf of the broad ligament.  The peritoneum above the ureter was incised and stretched and the infundibulopelvic ligament was skeletonized, cauterized and cut.  The posterior peritoneum was taken down to the level of the KOH ring.  The anterior peritoneum was also taken down.  The bladder flap was created to the level of the KOH ring.  The uterine artery on the right side was skeletonized, cauterized and cut in the normal manner.  A similar procedure was performed on the left.  The colpotomy was made and the uterus, cervix, bilateral ovaries and tubes were amputated and delivered through the vagina.  Pedicles were inspected and excellent hemostasis was achieved.    The colpotomy at the vaginal cuff was closed with Vicryl on a CT1 needle in running manner.  Irrigation was used and excellent hemostasis was achieved.  At this point in the procedure was completed.  Robotic instruments were removed under direct visulaization.  The robot was undocked.   The  umbilical incision was extended to 2cm. The fascia was exposed and the hernia sac was dissected from the fascial edges. The fascial edges were reapproximated with 0 prolene in interrupted sutures. The 10 mm ports were closed with Vicryl on a UR-5 needle and the fascia was closed with 0 Vicryl on a UR-5 needle.  The skin was closed with 4-0 Vicryl in a subcuticular manner.  Dermabond was applied.  Sponge, lap and needle counts correct x 2.  The patient was taken to the recovery room in stable condition.  The vagina was swabbed with  minimal bleeding noted.   All instrument and needle counts were correct x  3.   The patient was transferred to the recovery room in stable condition.  Donaciano Eva, MD

## 2017-08-05 NOTE — Anesthesia Preprocedure Evaluation (Signed)
Anesthesia Evaluation  Patient identified by MRN, date of birth, ID band Patient awake    Reviewed: Allergy & Precautions, NPO status , Patient's Chart, lab work & pertinent test results, reviewed documented beta blocker date and time   Airway Mallampati: II  TM Distance: >3 FB Neck ROM: Full    Dental  (+) Dental Advisory Given   Pulmonary neg pulmonary ROS,    breath sounds clear to auscultation       Cardiovascular hypertension, Pt. on medications and Pt. on home beta blockers + Peripheral Vascular Disease   Rhythm:Regular Rate:Normal     Neuro/Psych negative neurological ROS     GI/Hepatic Neg liver ROS, GERD  ,  Endo/Other  diabetes, Type 2, Oral Hypoglycemic AgentsHypothyroidism   Renal/GU negative Renal ROS     Musculoskeletal   Abdominal   Peds  Hematology   Anesthesia Other Findings   Reproductive/Obstetrics                             Lab Results  Component Value Date   WBC 8.5 07/30/2017   HGB 12.5 07/30/2017   HCT 37.4 07/30/2017   MCV 90.6 07/30/2017   PLT 247 07/30/2017   Lab Results  Component Value Date   CREATININE 0.76 07/30/2017   BUN 15 07/30/2017   NA 138 07/30/2017   K 4.6 07/30/2017   CL 100 (L) 07/30/2017   CO2 29 07/30/2017    Anesthesia Physical Anesthesia Plan  ASA: II  Anesthesia Plan: General   Post-op Pain Management:    Induction: Intravenous  PONV Risk Score and Plan: 4 or greater and Dexamethasone, Ondansetron and Treatment may vary due to age or medical condition  Airway Management Planned: Oral ETT  Additional Equipment:   Intra-op Plan:   Post-operative Plan: Extubation in OR  Informed Consent: I have reviewed the patients History and Physical, chart, labs and discussed the procedure including the risks, benefits and alternatives for the proposed anesthesia with the patient or authorized representative who has indicated his/her  understanding and acceptance.   Dental advisory given  Plan Discussed with: CRNA  Anesthesia Plan Comments:         Anesthesia Quick Evaluation

## 2017-08-05 NOTE — Transfer of Care (Signed)
Immediate Anesthesia Transfer of Care Note  Patient: Alexandria Irwin  Procedure(s) Performed: ROBOTIC ASSISTED TOTAL HYSTERECTOMY WITH BILATERAL SALPINGO OOPHORECTOMY (Bilateral ) SENTINEL NODE BIOPSY (Bilateral ) HERNIA REPAIR UMBILICAL ADULT (N/A )  Patient Location: PACU  Anesthesia Type:General  Level of Consciousness: awake, alert  and patient cooperative  Airway & Oxygen Therapy: Patient Spontanous Breathing and Patient connected to face mask  Post-op Assessment: Report given to RN and Post -op Vital signs reviewed and stable  Post vital signs: Reviewed and stable  Last Vitals:  Vitals:   08/05/17 0931  BP: 130/77  Pulse: 61  Resp: 16  Temp: 36.7 C  SpO2: 99%    Last Pain: There were no vitals filed for this visit.       Complications: No apparent anesthesia complications

## 2017-08-05 NOTE — H&P (Signed)
CC:     Chief Complaint  Patient presents with  . Endometrial adenocarcinoma [facility])    Assessment/Plan:  Ms. Alexandria Irwin  is a 72 y.o.  year old with grade 1 endometrial cancer and an umbilical hernia in the setting of type II DM and obesity.  A detailed discussion was held with the patient and her family with regard to to her endometrial cancer diagnosis. We discussed the standard management options for uterine cancer which includes surgery followed possibly by adjuvant therapy depending on the results of surgery. The options for surgical management include a hysterectomy and removal of the tubes and ovaries possibly with removal of pelvic and para-aortic lymph nodes.If feasible, a minimally invasive approach including a robotic hysterectomy or laparoscopic hysterectomy have benefits including shorter hospital stay, recovery time and better wound healing than with open surgery. The patient has been counseled about these surgical options and the risks of surgery in general including infection, bleeding, damage to surrounding structures (including bowel, bladder, ureters, nerves or vessels), and the postoperative risks of PE/ DVT, and lymphedema. I extensively reviewed the additional risks of robotic hysterectomy including possible need for conversion to open laparotomy.  I discussed positioning during surgery of trendelenberg and risks of minor facial swelling and care we take in preoperative positioning.  After counseling and consideration of her options, she desires to proceed with robotic assisted total hysterectomy with bilateral sapingo-oophorectomy and SLN biopsy.   She has an umbilical hernia which we will repair primarily at completion of the surgery. I discussed the risks of recurrence with this, particularly given her obesity.   She will be seen by anesthesia for preoperative clearance and discussion of postoperative pain management.  She was given the opportunity to ask questions,  which were answered to her satisfaction, and she is agreement with the above mentioned plan of care.  She will stop ASA 10 days preop and has been counseled regarding the increased risk for bleeding surrounding ASA use.   HPI: Ms. Alexandria Irwin is a 72 year old para 4 who was seen in consultation at the request of Dr. Deatra Ina for grade 1 endometrial cancer.  The patient has a history of postmenopausal bleeding since November 2017.  At this time she saw her primary care doctor who diagnosed a urinary tract infection and treated her intermittently with antibiotics for approximately 3 months.  However given the persistence of the symptoms she then sought evaluation with her gynecologist Dr. Deatra Ina in April 2018.  At this time a transvaginal ultrasound scan was performed which showed some fibroids in the uterus but no thickening of the endometrium.  Pap smear and pelvic examination were unremarkable and therefore it was felt that the source of bleeding was not gynecologic.  She was then sent to Dr. Noah Delaine from [facility] urology who performed an evaluation including a CT scan of the abdomen and pelvis in September 2018.  This showed grossly normal urologic system and no evidence of extrauterine disease however the uterus was slightly 4 cm intrauterine mass concerning for neoplasm.  Dr. Alyson Ingles then performed a cystoscopy which was also unremarkable.  She was referred back to Dr. Deatra Ina for further evaluation.  In October 2018 Dr. Deatra Ina repeated the pelvic ultrasound scan which again confirmed what appeared to be a thin endometrium with fibroids.  However given the persistence of bleeding and the CT findings he took the patient to the operating room on June 13, 2017 and performed a hysteroscopy D&C and polypectomy.  Final pathology revealed  FIGO grade 1 endometrioid adenocarcinoma.  The patient otherwise has medical history significant for a DVT in the right lower extremity approximately 12 years ago  that was spontaneous.  She was treated with aspirin 325 mg daily but no anti-thrombotic agents.  She has had no recurrences since that time.  She has a history of type 2 diabetes mellitus with fairly poorly controlled with an HbA1c earlier in 2018 of 9.2%.  At that time she is taking only metformin.  She was then added Januvia.  Follow-up HbA1c in November 2018 had reduced to 7.2%.  The patient reports fasting blood glucose levels between 07/31/1950.  Her primary care doctor Dr. Lenna Gilford manages her blood glucose.  Patient also has hypertension hypercholesterolemia.  She denies a coronary artery disease history.  She has had 4 prior vaginal deliveries.  She has no family history concerning for Lynch syndrome.  She is obese with a height of 5 foot 2 inches and a weight of 174 pounds.  Her prior abdominal surgery includes a laparoscopic cholecystectomy.  She has an umbilical hernia.  Current Meds:      Outpatient Encounter Medications as of 06/25/2017  Medication Sig  . acetaminophen (TYLENOL) 500 MG tablet Take 1,000 mg by mouth every 8 (eight) hours as needed for mild pain or headache.  . albuterol (PROVENTIL HFA;VENTOLIN HFA) 108 (90 BASE) MCG/ACT inhaler Inhale 2 puffs into the lungs every 6 (six) hours as needed for wheezing or shortness of breath.  Marland Kitchen aspirin 325 MG EC tablet Take 325 mg by mouth daily.  . B Complex Vitamins (VITAMIN B-COMPLEX PO) Take 1 tablet by mouth daily.  . Calcium Citrate-Vitamin D ([employer] CALCIUM CITRATE +D PO) Take 2 tablets by mouth every evening.   . Cholecalciferol (D 2000) 2000 units TABS Take 2,000 Units by mouth daily.  . cloNIDine (CATAPRES) 0.1 MG tablet TAKE 1 TABLET BY MOUTH TWICE DAILY  . cycloSPORINE (RESTASIS) 0.05 % ophthalmic emulsion Place 1 drop into both eyes 2 (two) times daily.   . furosemide (LASIX) 40 MG tablet TAKE ONE TABLET BY MOUTH ONCE DAILY  . glucose blood (ONETOUCH VERIO) test strip USE TO CHECK BLOOD SUGAR DAILY  . levothyroxine  (SYNTHROID, LEVOTHROID) 100 MCG tablet TAKE 1 TABLET BY MOUTH ONCE DAILY  . LORazepam (ATIVAN) 0.5 MG tablet Take 1/2-1 tablet at bedtime as needed (Patient taking differently: Take 0.25 mg by mouth at bedtime as needed for sleep. )  . losartan (COZAAR) 100 MG tablet TAKE ONE TABLET BY MOUTH ONCE DAILY  . Magnesium 250 MG TABS Take 250 mg by mouth every evening.   . meclizine (ANTIVERT) 50 MG tablet Take 1 tablet (50 mg total) by mouth 2 (two) times daily as needed. (Patient taking differently: Take 50 mg by mouth 2 (two) times daily as needed for dizziness. )  . metFORMIN (GLUCOPHAGE) 500 MG tablet TAKE 2 TABLETS BY MOUTH ONCE DAILY IN THE MORNING AND TAKE 1 TABLET ONCE DAILY IN THE EVENING (Patient taking differently: Take 500-1,000 mg by mouth 2 (two) times daily with a meal. TAKE 2 TABLETS BY MOUTH ONCE DAILY IN THE MORNING AND TAKE 1 TABLET ONCE DAILY IN THE EVENING)  . metoprolol succinate (TOPROL-XL) 50 MG 24 hr tablet TAKE ONE TABLET BY MOUTH TWICE DAILY WITH  OR  IMMEDIATELY  FOLLOWING  A  MEAL  . Omega-3 Fatty Acids (FISH OIL) 1000 MG CAPS Take 1,000 mg by mouth every evening.  Glory Rosebush DELICA LANCETS 82U MISC USE ONE  TO  CHECK GLUCOSE ONCE DAILY  . simvastatin (ZOCOR) 40 MG tablet TAKE 1 TABLET BY MOUTH ONCE DAILY AT 6PM (Patient taking differently: Take 40 mg by mouth daily at 6 PM. )  . sitaGLIPtin (JANUVIA) 50 MG tablet Take 1 tablet (50 mg total) by mouth daily.  . vitamin C (ASCORBIC ACID) 500 MG tablet Take 500 mg by mouth daily.   No facility-administered encounter medications on file as of 06/25/2017.     Allergy:      Allergies  Allergen Reactions  . Ace Inhibitors Cough  . Norvasc [Amlodipine] Cough  . Latex Rash  . Neosporin [Neomycin-Bacitracin Zn-Polymyx] Rash  . Sulfa Antibiotics Rash    Social Hx:   Social History        Socioeconomic History  . Marital status: Married    Spouse name: Not on file  . Number of children: Not on file  . Years of  education: Not on file  . Highest education level: Not on file  Social Needs  . Financial resource strain: Not on file  . Food insecurity - worry: Not on file  . Food insecurity - inability: Not on file  . Transportation needs - medical: Not on file  . Transportation needs - non-medical: Not on file  Occupational History  . Not on file  Tobacco Use  . Smoking status: Never Smoker  . Smokeless tobacco: Never Used  Substance and Sexual Activity  . Alcohol use: No  . Drug use: No  . Sexual activity: Yes    Birth control/protection: None, Post-menopausal  Other Topics Concern  . Not on file  Social History Narrative  . Not on file    Past Surgical Hx:       Past Surgical History:  Procedure Laterality Date  . CATARACT EXTRACTION W/PHACO Right 03/14/2014   Procedure: CATARACT EXTRACTION PHACO AND INTRAOCULAR LENS PLACEMENT (IOC);  Surgeon: Tonny Branch, MD;  Location: [facility];  Service: Ophthalmology;  Laterality: Right;  CDE: 8.34  . CHOLECYSTECTOMY    . HYSTEROSCOPY W/D&C N/A 06/13/2017   Procedure: DILATATION AND CURETTAGE /HYSTEROSCOPY;  Surgeon: Alden Hipp, MD;  Location: [facility];  Service: Gynecology;  Laterality: N/A;  . VEIN SURGERY Right    vein stripping-leg    Past Medical Hx:  Past Medical History:  Diagnosis Date  . Cataract   . Diabetes mellitus without complication ([place])   . DVT (deep venous thrombosis) ([facility])    right leg  . GERD (gastroesophageal reflux disease)   . Graded compression stocking in place   . Hyperlipidemia   . Hypertension   . Hypothyroidism   . Pneumonia    history of  . Thyroid disease   . Vitamin B 12 deficiency   . Vitamin D deficiency     Past Gynecological History:  SVD x 4 No LMP recorded. Patient is postmenopausal.  Family Hx:       Family History  Problem Relation Age of Onset  . Cancer Mother        ovarian    Review of Systems:  Constitutional  Feels well,    ENT Normal  appearing ears and nares bilaterally Skin/Breast  No rash, sores, jaundice, itching, dryness Cardiovascular  No chest pain, shortness of breath, or edema  Pulmonary  No cough or wheeze.  Gastro Intestinal  No nausea, vomitting, or diarrhoea. No bright red blood per rectum, no abdominal pain, change in bowel movement, or constipation.  Genito Urinary  No frequency, urgency, dysuria, + postmenopausal bleeding  Musculo Skeletal  No myalgia, arthralgia, joint swelling or pain  Neurologic  No weakness, numbness, change in gait,  Psychology  No depression, anxiety, insomnia.   Vitals:  Blood pressure (!) 149/77, pulse 68, temperature 98.2 F (36.8 C), temperature source Oral, resp. rate 18, weight 174 lb 8 oz (79.2 kg), SpO2 97 %.  Physical Exam: WD in NAD Neck  Supple NROM, without any enlargements.  Lymph Node Survey No cervical supraclavicular or inguinal adenopathy Cardiovascular  Pulse normal rate, regularity and rhythm. S1 and S2 normal.  Lungs  Clear to auscultation bilateraly, without wheezes/crackles/rhonchi. Good air movement.  Skin  No rash/lesions/breakdown  Psychiatry  Alert and oriented to person, place, and time  Abdomen  Normoactive bowel sounds, abdomen soft, non-tender and obese with a reducible 2cm umbilical hernia.  Back No CVA tenderness Genito Urinary  Vulva/vagina: Normal external female genitalia.   No lesions. No discharge or bleeding.             Bladder/urethra:  No lesions or masses, well supported bladder             Vagina: normal             Cervix: Normal appearing, no lesions.             Uterus:  Small, mobile, no parametrial involvement or nodularity.             Adnexa: no palpable masses. Rectal  Good tone, no masses no cul de sac nodularity.  Extremities  No bilateral cyanosis, clubbing or edema.   Donaciano Eva, MD

## 2017-08-05 NOTE — Discharge Instructions (Signed)
08/05/2017  Return to work: 4 weeks  Activity: 1. Be up and out of the bed during the day.  Take a nap if needed.  You may walk up steps but be careful and use the hand rail.  Stair climbing will tire you more than you think, you may need to stop part way and rest.   2. No lifting or straining for 6 weeks.  3. No driving for 1 weeks.  Do Not drive if you are taking narcotic pain medicine.  4. Shower daily.  Use soap and water on your incision and pat dry; don't rub.   5. No sexual activity and nothing in the vagina for 8 weeks.  Medications:  - Take ibuprofen and tylenol first line for pain control. Take these regularly (every 6 hours) to decrease the build up of pain.  - If necessary, for severe pain not relieved by ibuprofen, take percocet.  - While taking percocet you should take sennakot every night to reduce the likelihood of constipation. If this causes diarrhea, stop its use.  Diet: 1. Low sodium Heart Healthy Diet is recommended.  2. It is safe to use a laxative if you have difficulty moving your bowels.   Wound Care: 1. Keep clean and dry.  Shower daily.  Reasons to call the Doctor:   Fever - Oral temperature greater than 100.4 degrees Fahrenheit  Foul-smelling vaginal discharge  Difficulty urinating  Nausea and vomiting  Increased pain at the site of the incision that is unrelieved with pain medicine.  Difficulty breathing with or without chest pain  New calf pain especially if only on one side  Sudden, continuing increased vaginal bleeding with or without clots.   Follow-up: 1. See Everitt Amber in 3 weeks.  Contacts: For questions or concerns you should contact:  Dr. Everitt Amber at [phone] After hours and on week-ends call [phone] and ask to speak to the physician on call for Gynecologic Oncology

## 2017-08-05 NOTE — Anesthesia Procedure Notes (Signed)
Procedure Name: Intubation Date/Time: 08/05/2017 11:12 AM Performed by: Claudia Desanctis, CRNA Pre-anesthesia Checklist: Patient identified, Emergency Drugs available, Suction available and Patient being monitored Patient Re-evaluated:Patient Re-evaluated prior to induction Oxygen Delivery Method: Circle system utilized Preoxygenation: Pre-oxygenation with 100% oxygen Induction Type: IV induction Ventilation: Mask ventilation without difficulty Laryngoscope Size: Miller and 3 Grade View: Grade I Tube type: Oral Tube size: 7.0 mm Number of attempts: 1 Airway Equipment and Method: Stylet Placement Confirmation: ETT inserted through vocal cords under direct vision,  positive ETCO2 and breath sounds checked- equal and bilateral Secured at: 22 cm Tube secured with: Tape Dental Injury: Teeth and Oropharynx as per pre-operative assessment

## 2017-08-06 ENCOUNTER — Encounter (HOSPITAL_COMMUNITY): Payer: Self-pay | Admitting: Gynecologic Oncology

## 2017-08-06 DIAGNOSIS — N736 Female pelvic peritoneal adhesions (postinfective): Secondary | ICD-10-CM | POA: Diagnosis not present

## 2017-08-06 DIAGNOSIS — K429 Umbilical hernia without obstruction or gangrene: Secondary | ICD-10-CM | POA: Diagnosis not present

## 2017-08-06 DIAGNOSIS — C7982 Secondary malignant neoplasm of genital organs: Secondary | ICD-10-CM | POA: Diagnosis not present

## 2017-08-06 DIAGNOSIS — N888 Other specified noninflammatory disorders of cervix uteri: Secondary | ICD-10-CM | POA: Diagnosis not present

## 2017-08-06 DIAGNOSIS — E1151 Type 2 diabetes mellitus with diabetic peripheral angiopathy without gangrene: Secondary | ICD-10-CM | POA: Diagnosis not present

## 2017-08-06 DIAGNOSIS — C541 Malignant neoplasm of endometrium: Secondary | ICD-10-CM | POA: Diagnosis not present

## 2017-08-06 LAB — BASIC METABOLIC PANEL
Anion gap: 6 (ref 5–15)
BUN: 8 mg/dL (ref 6–20)
CALCIUM: 8.7 mg/dL — AB (ref 8.9–10.3)
CO2: 29 mmol/L (ref 22–32)
CREATININE: 0.72 mg/dL (ref 0.44–1.00)
Chloride: 104 mmol/L (ref 101–111)
GFR calc Af Amer: >60 mL/min (ref >60)
GLUCOSE: 159 mg/dL — AB (ref 65–99)
POTASSIUM: 3.9 mmol/L (ref 3.5–5.1)
Sodium: 139 mmol/L (ref 135–145)

## 2017-08-06 LAB — CBC
HCT: 31.3 % — ABNORMAL LOW (ref 36.0–46.0)
Hemoglobin: 10.6 g/dL — ABNORMAL LOW (ref 12.0–15.0)
MCH: 30.5 pg (ref 26.0–34.0)
MCHC: 33.9 g/dL (ref 30.0–36.0)
MCV: 89.9 fL (ref 78.0–100.0)
PLATELETS: 199 10*3/uL (ref 150–400)
RBC: 3.48 MIL/uL — AB (ref 3.87–5.11)
RDW: 13.3 % (ref 11.5–15.5)
WBC: 7.2 10*3/uL (ref 4.0–10.5)

## 2017-08-06 LAB — GLUCOSE, CAPILLARY
GLUCOSE-CAPILLARY: 115 mg/dL — AB (ref 65–99)
Glucose-Capillary: 136 mg/dL — ABNORMAL HIGH (ref 65–99)

## 2017-08-06 MED ORDER — SENNOSIDES-DOCUSATE SODIUM 8.6-50 MG PO TABS: 2.0000 | ORAL_TABLET | Freq: Every day | ORAL | 1 refills | Status: DC

## 2017-08-06 MED ORDER — OXYCODONE HCL 5 MG PO TABS: 5.0000 mg | ORAL_TABLET | ORAL | 0 refills | Status: DC | PRN

## 2017-08-06 MED ORDER — IBUPROFEN 800 MG PO TABS: 800.0000 mg | ORAL_TABLET | Freq: Four times a day (QID) | ORAL | 0 refills | Status: DC

## 2017-08-06 NOTE — Plan of Care (Signed)
  Elimination: Will not experience complications related to bowel motility 08/06/2017 1055 - Progressing by Dorene Sorrow, RN   Nutrition: Adequate nutrition will be maintained 08/06/2017 1055 - Progressing by Dorene Sorrow, RN   Activity: Risk for activity intolerance will decrease 08/06/2017 1055 - Progressing by Dorene Sorrow, RN

## 2017-08-06 NOTE — Progress Notes (Signed)
08/06/17  1200  Reviewed discharge instructions with patient and her daughter. Both verbalized understanding of discharge instructions. Copy of discharge instructions and prescription given to patient.

## 2017-08-06 NOTE — Discharge Summary (Signed)
Physician Discharge Summary  Patient ID: Alexandria Irwin MRN: [id number] DOB/AGE: 74-Nov-1947 74 y.o.  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  4/ new medications on discharge include senakot, percocet, ibuprofen.   Consults: None  Significant Diagnostic Studies: labs:  CBC    Component Value Date/Time   WBC 7.2 08/06/2017 0608   RBC 3.48 (L) 08/06/2017 0608   HGB 10.6 (L) 08/06/2017 0608   HGB 12.4 05/26/2017 0912   HCT 31.3 (L) 08/06/2017 0608   HCT 36.8 05/26/2017 0912   PLT 199 08/06/2017 0608   PLT 209 05/26/2017 0912   MCV 89.9 08/06/2017 0608   MCV 92 05/26/2017 0912   MCH 30.5 08/06/2017 0608   MCHC 33.9 08/06/2017 0608   RDW 13.3 08/06/2017 0608   RDW 13.9 05/26/2017 0912   LYMPHSABS 1.9 05/26/2017 0912   MONOABS 0.4 09/18/2013 1032   EOSABS 0.2 05/26/2017 0912   BASOSABS 0.0 05/26/2017 0912     Treatments: surgery: seeabove  Discharge Exam: Blood pressure (!) 103/49, pulse (!) 57, temperature 98.3 F (36.8 C), temperature source Oral, resp. rate 16, height 5\' 2"  (1.575 m), weight 167 lb 8 oz (76 kg), SpO2 99 %. General appearance: alert and cooperative GI: soft, non-tender; bowel sounds normal; no masses,  no organomegaly Incision/Wound: normal x 5 and in tact  Disposition: 01-Home or Self Care  Discharge Instructions    (HEART FAILURE PATIENTS) Call MD:  Anytime you have any of  the following symptoms: 1) 3 pound weight gain in 24 hours or 5 pounds in 1 week 2) shortness of breath, with or without a dry hacking cough 3) swelling in the hands, feet or stomach 4) if you have to sleep on extra pillows at night in order to breathe.   Complete by:  As directed    Call MD for:  difficulty breathing, headache or visual disturbances   Complete by:  As directed    Call MD for:  extreme fatigue   Complete by:  As directed    Call MD for:  hives   Complete by:  As directed    Call MD for:  persistant dizziness or light-headedness   Complete by:  As directed    Call MD for:  persistant nausea and vomiting   Complete by:  As directed    Call MD for:  redness, tenderness, or signs of infection (pain, swelling, redness, odor or green/yellow discharge around incision site)   Complete by:  As directed    Call MD for:  severe uncontrolled pain   Complete by:  As directed    Call MD for:  temperature >100.4   Complete by:  As directed    Diet - low sodium heart healthy   Complete by:  As directed    Diet general   Complete by:  As directed    Driving Restrictions   Complete by:  As directed    No driving for 7 days or until off narcotic pain medication  Increase activity slowly   Complete by:  As directed    Remove dressing in 24 hours   Complete by:  As directed    Sexual Activity Restrictions   Complete by:  As directed    No intercourse for 6 weeks     Allergies as of 08/06/2017      Reactions   Ace Inhibitors Cough   Norvasc [amlodipine] Cough   Latex Rash   Neosporin [neomycin-bacitracin Zn-polymyx] Rash   Sulfa Antibiotics Rash      Medication List    TAKE these medications   acetaminophen 500 MG tablet Commonly known as:  TYLENOL Take 1,000 mg by mouth every 8 (eight) hours as needed for mild pain or headache.   albuterol 108 (90 Base) MCG/ACT inhaler Commonly known as:  PROVENTIL HFA;VENTOLIN HFA Inhale 2 puffs into the lungs every 6 (six) hours as  needed for wheezing or shortness of breath.   aspirin 325 MG EC tablet Take 325 mg by mouth daily.   cloNIDine 0.1 MG tablet Commonly known as:  CATAPRES TAKE 1 TABLET BY MOUTH TWICE DAILY   [employer] CALCIUM CITRATE +D PO Take 2 tablets by mouth every evening.   cyanocobalamin 1000 MCG/ML injection Commonly known as:  (VITAMIN B-12) Inject 1,000 mcg into the muscle every Thursday.   cycloSPORINE 0.05 % ophthalmic emulsion Commonly known as:  RESTASIS Place 1 drop into both eyes 2 (two) times daily.   D 2000 2000 units Tabs Generic drug:  Cholecalciferol Take 2,000 Units by mouth daily.   Fish Oil 1000 MG Caps Take 1,000 mg by mouth every evening.   furosemide 40 MG tablet Commonly known as:  LASIX TAKE 1 TABLET BY MOUTH ONCE DAILY   glucose blood test strip Commonly known as:  ONETOUCH VERIO USE TO CHECK BLOOD SUGAR DAILY   ibuprofen 800 MG tablet Commonly known as:  ADVIL,MOTRIN Take 1 tablet (800 mg total) by mouth every 6 (six) hours.   levothyroxine 100 MCG tablet Commonly known as:  SYNTHROID, LEVOTHROID TAKE 1 TABLET BY MOUTH ONCE DAILY What changed:    how much to take  how to take this  when to take this   losartan 100 MG tablet Commonly known as:  COZAAR TAKE 1 TABLET BY MOUTH ONCE DAILY   Magnesium 250 MG Tabs Take 250 mg by mouth every evening.   meclizine 50 MG tablet Commonly known as:  ANTIVERT Take 1 tablet (50 mg total) by mouth 2 (two) times daily as needed. What changed:  reasons to take this   metFORMIN 500 MG tablet Commonly known as:  GLUCOPHAGE TAKE 2 TABLETS BY MOUTH ONCE DAILY IN THE MORNING AND TAKE 1 TABLET ONCE DAILY IN THE EVENING What changed:    how much to take  how to take this  when to take this  additional instructions   metoprolol succinate 50 MG 24 hr tablet Commonly known as:  TOPROL-XL TAKE ONE TABLET BY MOUTH TWICE DAILY WITH  OR  IMMEDIATELY  FOLLOWING  A  MEAL   ONETOUCH DELICA LANCETS 22G Misc USE ONE   TO CHECK GLUCOSE ONCE DAILY   oxyCODONE 5 MG immediate release tablet Commonly known as:  Oxy IR/ROXICODONE Take 1 tablet (5 mg total) by mouth every 4 (four) hours as needed for severe pain or breakthrough pain.   PEPCID AC PO Take by mouth every morning.   senna-docusate 8.6-50 MG tablet Commonly known as:  Senokot-S Take 2 tablets by mouth at bedtime.  simvastatin 40 MG tablet Commonly known as:  ZOCOR TAKE 1 TABLET BY MOUTH ONCE DAILY AT 6PM What changed:    how much to take  how to take this  when to take this  additional instructions   sitaGLIPtin 50 MG tablet Commonly known as:  JANUVIA Take 1 tablet (50 mg total) by mouth daily.   vitamin C 500 MG tablet Commonly known as:  ASCORBIC ACID Take 500 mg by mouth daily.        Signed: Donaciano Eva 08/06/2017, 10:30 AM

## 2017-08-12 ENCOUNTER — Telehealth: Payer: Self-pay | Admitting: *Deleted

## 2017-08-12 ENCOUNTER — Telehealth: Payer: Self-pay | Admitting: Gynecologic Oncology

## 2017-08-12 DIAGNOSIS — C541 Malignant neoplasm of endometrium: Secondary | ICD-10-CM

## 2017-08-12 HISTORY — DX: Malignant neoplasm of endometrium: C54.1

## 2017-08-12 NOTE — Telephone Encounter (Signed)
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 Was present on serosa and left fallopian tube.  Recommendation is for CT abdo/pelvis and chest. Referral to med onc for chemotherapy with 6 cycles of adjuvant carboplatin and paclitaxel. Referral to rad onc for consideration of vaginal brachytherapy.  Donaciano Eva, MD

## 2017-08-12 NOTE — Telephone Encounter (Signed)
Scheduled the CT scan and called the patient with the information. Spoke with the patient and gave date/time of 2/14 at 8:30am, arrive at 8:15am. Advised the patient to go to [facility] and pick up the contrast from the radiology department. Advised the patient the radiology department will give her instructions.

## 2017-08-12 NOTE — Telephone Encounter (Signed)
Let me know when CT is expected and I will see her after

## 2017-08-13 ENCOUNTER — Telehealth: Payer: Self-pay | Admitting: Gynecologic Oncology

## 2017-08-13 ENCOUNTER — Telehealth: Payer: Self-pay | Admitting: *Deleted

## 2017-08-13 ENCOUNTER — Encounter: Payer: Self-pay | Admitting: Radiation Oncology

## 2017-08-13 NOTE — Telephone Encounter (Signed)
Called and gave the patient the date/time of February 18th at 2pm to see Dr. Alvy Bimler

## 2017-08-13 NOTE — Telephone Encounter (Signed)
Called patient no answer.

## 2017-08-14 ENCOUNTER — Ambulatory Visit (HOSPITAL_COMMUNITY)
Admission: RE | Admit: 2017-08-14 | Discharge: 2017-08-14 | Disposition: A | Discharge status: Home / Self Care | Payer: Medicare Other | Source: Ambulatory Visit | Attending: Gynecologic Oncology | Admitting: Gynecologic Oncology

## 2017-08-14 DIAGNOSIS — C541 Malignant neoplasm of endometrium: Secondary | ICD-10-CM | POA: Insufficient documentation

## 2017-08-14 DIAGNOSIS — I2510 Atherosclerotic heart disease of native coronary artery without angina pectoris: Secondary | ICD-10-CM | POA: Diagnosis not present

## 2017-08-14 DIAGNOSIS — I700 Atherosclerosis of aorta: Secondary | ICD-10-CM | POA: Insufficient documentation

## 2017-08-14 DIAGNOSIS — Z90710 Acquired absence of both cervix and uterus: Secondary | ICD-10-CM | POA: Insufficient documentation

## 2017-08-14 MED ORDER — IOPAMIDOL (ISOVUE-300) INJECTION 61%
INTRAVENOUS | Status: AC
  Filled 2017-08-14: qty 100

## 2017-08-14 MED ORDER — IOPAMIDOL (ISOVUE-300) INJECTION 61%
100.0000 mL | Freq: Once | INTRAVENOUS | Status: AC | PRN
  Administered 2017-08-14: 100 mL via INTRAVENOUS

## 2017-08-15 ENCOUNTER — Telehealth: Payer: Self-pay | Admitting: Gynecologic Oncology

## 2017-08-15 NOTE — Telephone Encounter (Signed)
Informed of CT scan results.  No concerns voiced.  Appts reinforced.  Advised to call for any needs.

## 2017-08-18 ENCOUNTER — Other Ambulatory Visit: Payer: Self-pay | Admitting: Hematology and Oncology

## 2017-08-18 ENCOUNTER — Inpatient Hospital Stay: Payer: Medicare Other | Attending: Hematology and Oncology | Admitting: Hematology and Oncology

## 2017-08-18 ENCOUNTER — Telehealth: Payer: Self-pay | Admitting: *Deleted

## 2017-08-18 ENCOUNTER — Encounter: Payer: Self-pay | Admitting: Hematology and Oncology

## 2017-08-18 VITALS — BP 129/69 | HR 69 | Temp 97.7°F | Resp 18 | Ht 62.0 in | Wt 172.8 lb

## 2017-08-18 DIAGNOSIS — E119 Type 2 diabetes mellitus without complications: Secondary | ICD-10-CM | POA: Insufficient documentation

## 2017-08-18 DIAGNOSIS — C541 Malignant neoplasm of endometrium: Secondary | ICD-10-CM | POA: Diagnosis not present

## 2017-08-18 DIAGNOSIS — Z90710 Acquired absence of both cervix and uterus: Secondary | ICD-10-CM | POA: Diagnosis not present

## 2017-08-18 DIAGNOSIS — I10 Essential (primary) hypertension: Secondary | ICD-10-CM

## 2017-08-18 DIAGNOSIS — E785 Hyperlipidemia, unspecified: Secondary | ICD-10-CM

## 2017-08-18 DIAGNOSIS — R233 Spontaneous ecchymoses: Secondary | ICD-10-CM | POA: Diagnosis not present

## 2017-08-18 DIAGNOSIS — Z8041 Family history of malignant neoplasm of ovary: Secondary | ICD-10-CM | POA: Insufficient documentation

## 2017-08-18 DIAGNOSIS — E1169 Type 2 diabetes mellitus with other specified complication: Secondary | ICD-10-CM

## 2017-08-18 DIAGNOSIS — R238 Other skin changes: Secondary | ICD-10-CM | POA: Insufficient documentation

## 2017-08-18 NOTE — Assessment & Plan Note (Signed)
She has type 2 diabetes without complications I suspect her blood sugar will run high with premedications of steroid during treatment We will discuss further about reducing premedication dexamethasone for chemotherapy.

## 2017-08-18 NOTE — Assessment & Plan Note (Signed)
I do not believe the patient had remote history of DVT I suspect she might have superficial thrombophlebitis after vein stripping Multiple ultrasound venous Doppler from 2014 in 2015 excluded DVT She has excessive bruising I recommend reducing aspirin to 81 mg daily

## 2017-08-18 NOTE — Telephone Encounter (Signed)
-----  Message from Ni Gorsuch, MD sent at 08/18/2017  2:56 PM EST ----- Regarding: add extra testing Pls call pathologist to add BRAF mutation and MLH1 hypermethylation test on recent surgical sample  Accession: [id number] 

## 2017-08-18 NOTE — Progress Notes (Signed)
[facility] CONSULT NOTE  Patient Care Team: Sharion Balloon, FNP as PCP - General (Family Medicine)  CHIEF COMPLAINTS/PURPOSE OF CONSULTATION:  Endometrial cancer status post hysterectomy, for adjuvant treatment  HISTORY OF PRESENTING ILLNESS:  Alexandria Irwin 72 y.o. female is here because of recent diagnosis of endometrial cancer. She is here accompanied by her daughter, Sharyn Lull She is currently retired.  Her husband and one other daughter lives with her. Her symptoms started with postmenopausal bleeding for over a year.  She underwent extensive evaluation and was subsequently found to have cancer. I have reviewed her chart and materials related to her cancer extensively and collaborated history with the patient. Summary of oncologic history is as follows: Oncology History   endometroid MSI by [facility] showed loss of nuclear expression      Endometrial adenocarcinoma ([place])   06/13/2017 Procedure    PROCEDURE: Hysteroscopy, D&C  SURGEON: Richard D. Deatra Ina M.D.  FINDINGS: Hypertrophic endometrial tissue, no polyps, no myoma         06/13/2017 Pathology Results    Endometrium, curettage ENDOMETRIOID ADENOCARCINOMA, FIGO GRADE 1      08/05/2017 Pathology Results    1. Lymph node, sentinel, biopsy, right external iliac - NO CARCINOMA IDENTIFIED IN ONE LYMPH NODE (0/1) - SEE COMMENT 2. Lymph node, sentinel, biopsy, left external iliac #1 - NO CARCINOMA IDENTIFIED IN ONE LYMPH NODE (0/1) - SEE COMMENT 3. Lymph node, sentinel, biopsy, left external iliac #2 - NO CARCINOMA IDENTIFIED IN ONE LYMPH NODE (0/1) - SEE COMMENT 4. Uterus +/- tubes/ovaries, neoplastic, ovaries UTERUS: - ENDOMETRIOID CARCINOMA, FIGO GRADE 2, INVOLVING THE ENTIRE POSTERIOR MYOMETRIUM WITH TUMOR PRESENT AT INKED SEROSAL SURFACE - SEE ONCOLOGY TABLE BELOW CERVIX: - NABOTHIAN CYSTS - NO DYSPLASIA OR MALIGNANCY IDENTIFIED BILATERAL OVARIES: - NO CARCINOMA IDENTIFIED BILATERAL FALLOPIAN  TUBES: - ENDOMETRIOID CARCINOMA INVOLVING LEFT FALLOPIAN TUBE  1. , 2 and 3. Cytokeratin AE1/3 was performed on the sentinel lymph nodes to exclude micrometastasis; they are negative. 4. ONCOLOGY TABLE-UTERUS, CARCINOMA OR CARCINOSARCOMA Specimen: Uterus, bilateral fallopian tubes and ovaries Procedure: Total hysterectomy and bilateral salpingo-oophorectomy Histologic type: Endometrioid carcinoma, NOS Grade: FIGO Grade 2 Myometrial invasion: >50% (The posterior myometrium has full thickness involvement with tumor extending to the inked serosal surface) Uterine Serosa involvement: Present Cervical stromal involvement: Not identified Other Tissue/Organ Involvement: Left fallopian tube Lymphovascular invasion: Not identified Peritoneal washings: Not submitted/unknown Lymph nodes: Examined: 3 Sentinel 0 Non-sentinel 3 Total Lymph nodes with metastasis: 0 Isolated tumor cells (< 0.2 mm): 0 Micrometastasis: (> 0.2 mm and < 2.0 mm): 0 Macrometastasis: (> 2.0 mm): 0 Extracapsular extension: N/A TNM code: pT3a, pN0 FIGO Stage (based on pathologic findings, needs clinical correlation): IIIa *Block 4I is representative of the tumor and suitable for additional studies, if requested. COMMENT: MMR (by immunohistochemistry) is pending and will be reported in an addendum.      08/05/2017 Surgery    Operation: Robotic-assisted laparoscopic total hysterectomy with bilateral salpingoophorectomy, SLN biopsy, umbilical hernia  Surgeon: Donaciano Eva  Operative Findings:  : 6cm normal appearing uterus, normal right tube and ovary, left ovary with cystic mass, left sigmoid colon diverticular disease, adhesions to left ovary.  No gross extrauterine disease.         08/14/2017 Imaging    1. Interval hysterectomy. No findings of recurrent or metastatic disease. 2. Small volume pelvic fluid and a presumed postoperative seroma or lymphangioma within the left hemipelvis. 3. Subtle irregular  hepatic capsule is suspicious for mild cirrhosis.  Correlate with risk factors. 4. Coronary artery atherosclerosis. Aortic Atherosclerosis (ICD10-I70.0).      She is doing well after surgery except for excessive bruising.  She complained of some fatigue. She denies nausea, vomiting or changes in bowel habits.  She denies pain. She has no further vaginal bleeding after surgery. She was recently started on B12 injection for vitamin B12 deficiency. She has diabetes without complications, not requiring insulin treatment  MEDICAL HISTORY:  Past Medical History:  Diagnosis Date  . Cataract   . Endometrial adenocarcinoma ([place])   . Environmental and seasonal allergies   . GERD (gastroesophageal reflux disease)   . History of DVT of lower extremity    early 2000s--- lower right leg  . Hyperlipidemia   . Hypertension   . Hypothyroidism   . Type 2 diabetes mellitus ([place])    followed by pcp  . Umbilical hernia   . Vitamin B 12 deficiency   . Vitamin D deficiency   . Wears glasses     SURGICAL HISTORY: Past Surgical History:  Procedure Laterality Date  . BREAST BIOPSY Left 2009   benign  . CATARACT EXTRACTION W/ INTRAOCULAR LENS IMPLANT Left 2018  . CATARACT EXTRACTION W/PHACO Right 03/14/2014   Procedure: CATARACT EXTRACTION PHACO AND INTRAOCULAR LENS PLACEMENT (IOC);  Surgeon: Tonny Branch, MD;  Location: [facility];  Service: Ophthalmology;  Laterality: Right;  CDE: 8.34  . FOOT GANGLION EXCISION Right 2013  . HYSTEROSCOPY W/D&C N/A 06/13/2017   Procedure: DILATATION AND CURETTAGE /HYSTEROSCOPY;  Surgeon: Alden Hipp, MD;  Location: [facility];  Service: Gynecology;  Laterality: N/A;  . LAPAROSCOPIC CHOLECYSTECTOMY  2005  . ROBOTIC ASSISTED TOTAL HYSTERECTOMY WITH BILATERAL SALPINGO OOPHERECTOMY Bilateral 08/05/2017   Procedure: ROBOTIC ASSISTED TOTAL HYSTERECTOMY WITH BILATERAL SALPINGO OOPHORECTOMY;  Surgeon: Everitt Amber, MD;  Location: [facility];  Service: Gynecology;  Laterality: Bilateral;   . SENTINEL NODE BIOPSY Bilateral 08/05/2017   Procedure: SENTINEL NODE BIOPSY;  Surgeon: Everitt Amber, MD;  Location: [facility];  Service: Gynecology;  Laterality: Bilateral;  . UMBILICAL HERNIA REPAIR N/A 08/05/2017   Procedure: HERNIA REPAIR UMBILICAL ADULT;  Surgeon: Everitt Amber, MD;  Location: [facility];  Service: Gynecology;  Laterality: N/A;  . VEIN SURGERY Right 2011   vein stripping-leg  and laser treatment    SOCIAL HISTORY: Social History   Socioeconomic History  . Marital status: Married    Spouse name: Not on file  . Number of children: Not on file  . Years of education: Not on file  . Highest education level: Not on file  Social Needs  . Financial resource strain: Not on file  . Food insecurity - worry: Not on file  . Food insecurity - inability: Not on file  . Transportation needs - medical: Not on file  . Transportation needs - non-medical: Not on file  Occupational History  . Occupation: retired  Tobacco Use  . Smoking status: Never Smoker  . Smokeless tobacco: Never Used  Substance and Sexual Activity  . Alcohol use: No  . Drug use: No  . Sexual activity: Yes    Birth control/protection: Post-menopausal  Other Topics Concern  . Not on file  Social History Narrative  . Not on file    FAMILY HISTORY: Family History  Problem Relation Age of Onset  . Cancer Mother        ovarian    ALLERGIES:  is allergic to ace inhibitors; norvasc [amlodipine]; latex; neosporin [neomycin-bacitracin zn-polymyx]; and sulfa antibiotics.  MEDICATIONS:  Current Outpatient Medications  Medication Sig Dispense Refill  . acetaminophen (TYLENOL) 500 MG tablet Take 1,000 mg by mouth every 8 (eight) hours as needed for mild pain or headache.    . albuterol (PROVENTIL HFA;VENTOLIN HFA) 108 (90 BASE) MCG/ACT inhaler Inhale 2 puffs into the lungs every 6 (six) hours as needed for wheezing or shortness of breath. 1 Inhaler 1  . aspirin 325 MG EC tablet Take 325 mg by mouth daily.    .  Calcium Citrate-Vitamin D ([employer] CALCIUM CITRATE +D PO) Take 2 tablets by mouth every evening.     . Cholecalciferol (D 2000) 2000 units TABS Take 2,000 Units by mouth daily.    . cloNIDine (CATAPRES) 0.1 MG tablet TAKE 1 TABLET BY MOUTH TWICE DAILY 180 tablet 1  . cyanocobalamin (,VITAMIN B-12,) 1000 MCG/ML injection Inject 1,000 mcg into the muscle every Thursday.    . cycloSPORINE (RESTASIS) 0.05 % ophthalmic emulsion Place 1 drop into both eyes 2 (two) times daily.     . Famotidine (PEPCID AC PO) Take by mouth every morning.    . furosemide (LASIX) 40 MG tablet TAKE 1 TABLET BY MOUTH ONCE DAILY 90 tablet 0  . glucose blood (ONETOUCH VERIO) test strip USE TO CHECK BLOOD SUGAR DAILY 100 each 11  . ibuprofen (ADVIL,MOTRIN) 800 MG tablet Take 1 tablet (800 mg total) by mouth every 6 (six) hours. 30 tablet 0  . levothyroxine (SYNTHROID, LEVOTHROID) 100 MCG tablet TAKE 1 TABLET BY MOUTH ONCE DAILY (Patient taking differently: TAKE 1 TABLET BY MOUTH ONCE DAILY--- takes in am) 90 tablet 2  . losartan (COZAAR) 100 MG tablet TAKE 1 TABLET BY MOUTH ONCE DAILY 90 tablet 0  . Magnesium 250 MG TABS Take 250 mg by mouth every evening.     . meclizine (ANTIVERT) 50 MG tablet Take 1 tablet (50 mg total) by mouth 2 (two) times daily as needed. (Patient taking differently: Take 50 mg by mouth 2 (two) times daily as needed for dizziness. ) 30 tablet 0  . metFORMIN (GLUCOPHAGE) 500 MG tablet TAKE 2 TABLETS BY MOUTH ONCE DAILY IN THE MORNING AND TAKE 1 TABLET ONCE DAILY IN THE EVENING (Patient taking differently: Take 500-1,000 mg by mouth 2 (two) times daily with a meal. TAKE 2 TABLETS BY MOUTH ONCE DAILY IN THE MORNING AND TAKE 1 TABLET ONCE DAILY IN THE EVENING) 270 tablet 1  . metoprolol succinate (TOPROL-XL) 50 MG 24 hr tablet TAKE ONE TABLET BY MOUTH TWICE DAILY WITH  OR  IMMEDIATELY  FOLLOWING  A  MEAL 180 tablet 4  . Omega-3 Fatty Acids (FISH OIL) 1000 MG CAPS Take 1,000 mg by mouth every evening.    Glory Rosebush  DELICA LANCETS 15V MISC USE ONE  TO CHECK GLUCOSE ONCE DAILY 100 each 1  . oxyCODONE (OXY IR/ROXICODONE) 5 MG immediate release tablet Take 1 tablet (5 mg total) by mouth every 4 (four) hours as needed for severe pain or breakthrough pain. 30 tablet 0  . senna-docusate (SENOKOT-S) 8.6-50 MG tablet Take 2 tablets by mouth at bedtime. 30 tablet 1  . simvastatin (ZOCOR) 40 MG tablet TAKE 1 TABLET BY MOUTH ONCE DAILY AT 6PM (Patient taking differently: Take 40 mg by mouth every evening. ) 90 tablet 1  . sitaGLIPtin (JANUVIA) 50 MG tablet Take 1 tablet (50 mg total) by mouth daily. 90 tablet 1  . vitamin C (ASCORBIC ACID) 500 MG tablet Take 500 mg by mouth daily.     Current Facility-Administered Medications  Medication Dose Route  Frequency Provider Last Rate Last Dose  . cyanocobalamin ((VITAMIN B-12)) injection 1,000 mcg  1,000 mcg Intramuscular Daily Evelina Dun A, FNP   1,000 mcg at 07/24/17 6629    REVIEW OF SYSTEMS:   Constitutional: Denies fevers, chills or abnormal night sweats Eyes: Denies blurriness of vision, double vision or watery eyes Ears, nose, mouth, throat, and face: Denies mucositis or sore throat Respiratory: Denies cough, dyspnea or wheezes Cardiovascular: Denies palpitation, chest discomfort or lower extremity swelling Gastrointestinal:  Denies nausea, heartburn or change in bowel habits Skin: Denies abnormal skin rashes Lymphatics: Denies new lymphadenopathy Neurological:Denies numbness, tingling or new weaknesses Behavioral/Psych: Mood is stable, no new changes  All other systems were reviewed with the patient and are negative.  PHYSICAL EXAMINATION: ECOG PERFORMANCE STATUS: 1 - Symptomatic but completely ambulatory  Vitals:   08/18/17 1343  BP: 129/69  Pulse: 69  Resp: 18  Temp: 97.7 F (36.5 C)  SpO2: 99%   Filed Weights   08/18/17 1343  Weight: 172 lb 12.8 oz (78.4 kg)    GENERAL:alert, no distress and comfortable.  She is obese, appears somewhat  frail for her age SKIN: skin color, texture, turgor are normal, no rashes or significant lesions.  Noted excessive bruising in her abdomen EYES: normal, conjunctiva are pink and non-injected, sclera clear OROPHARYNX:no exudate, no erythema and lips, buccal mucosa, and tongue normal  NECK: supple, thyroid normal size, non-tender, without nodularity LYMPH:  no palpable lymphadenopathy in the cervical, axillary or inguinal LUNGS: clear to auscultation and percussion with normal breathing effort HEART: regular rate & rhythm and no murmurs and no lower extremity edema ABDOMEN:abdomen soft, non-tender and normal bowel sounds.  Well-healed surgical scar Musculoskeletal:no cyanosis of digits and no clubbing  PSYCH: alert & oriented x 3 with fluent speech NEURO: no focal motor/sensory deficits  LABORATORY DATA:  I have reviewed the data as listed Lab Results  Component Value Date   WBC 7.2 08/06/2017   HGB 10.6 (L) 08/06/2017   HCT 31.3 (L) 08/06/2017   MCV 89.9 08/06/2017   PLT 199 08/06/2017   Recent Labs    01/20/17 0912 05/26/17 0912 06/05/17 1355 07/30/17 0942 08/06/17 0608  NA 140 142 138 138 139  K 5.0 4.6 4.7 4.6 3.9  CL 98 102 100* 100* 104  CO2 '25 27 28 29 29  ' GLUCOSE 196* 161* 127* 152* 159*  BUN '13 13 11 15 8  ' CREATININE 0.86 0.72 0.71 0.76 0.72  CALCIUM 9.6 9.1 9.9 10.0 8.7*  GFRNONAA 67 83 >60 >60 >60  GFRAA 78 96 >60 >60 >60  PROT 6.5 6.5  --  7.5  --   ALBUMIN 4.1 4.2  --  4.0  --   AST 30 17  --  27  --   ALT 26 15  --  19  --   ALKPHOS 38* 36*  --  42  --   BILITOT 0.6 0.5  --  0.5  --     RADIOGRAPHIC STUDIES: I have reviewed imaging study with the patient and the daughter I have personally reviewed the radiological images as listed and agreed with the findings in the report. Ct Chest W Contrast  Result Date: 08/14/2017 CLINICAL DATA:  Staging of endometrial cancer. EXAM: CT CHEST, ABDOMEN, AND PELVIS WITH CONTRAST TECHNIQUE: Multidetector CT imaging of  the chest, abdomen and pelvis was performed following the standard protocol during bolus administration of intravenous contrast. CONTRAST:  156m ISOVUE-300 IOPAMIDOL (ISOVUE-300) INJECTION 61% COMPARISON:  03/13/2017 abdominopelvic  CT. Chest radiograph 09/27/2014. FINDINGS: CT CHEST FINDINGS Cardiovascular: Aortic and branch vessel atherosclerosis. Tortuous thoracic aorta. Normal heart size, without pericardial effusion. Right coronary artery atherosclerosis. No central pulmonary embolism, on this non-dedicated study. Mediastinum/Nodes: No supraclavicular adenopathy. No mediastinal or hilar adenopathy. Lungs/Pleura: No pleural fluid.  Clear lungs. Musculoskeletal: No acute osseous abnormality. CT ABDOMEN PELVIS FINDINGS Hepatobiliary: Subtle irregular hepatic capsule, including on image 57/series 2. No focal liver lesion. Cholecystectomy, without biliary ductal dilatation. Pancreas: Normal, without mass or ductal dilatation. Spleen: Normal in size, without focal abnormality. Adrenals/Urinary Tract: Normal kidneys, without hydronephrosis. Normal urinary bladder. Stomach/Bowenormal stomach, without wall thickening. Normal colon and terminal ileum. Normal small bowel. Vascular/Lymphatic: Aortic and branch vessel atherosclerosis. No abdominopelvic adenopathy. Reproductive: Hysterectomy. Fluid in the left hemipelvis, likely adnexal, measures 4.4 x 3.8 cm on image 95/series 2. Other: Minimal free fluid in the pelvis. Anterior pelvic wall edema is likely postoperative. No well-defined fluid collection. Musculoskeletal: Degenerate disc disease at L4-5 and L5-S1. IMPRESSION: 1. Interval hysterectomy. No findings of recurrent or metastatic disease. 2. Small volume pelvic fluid and a presumed postoperative seroma or lymphangioma within the left hemipelvis. 3. Subtle irregular hepatic capsule is suspicious for mild cirrhosis. Correlate with risk factors. 4. Coronary artery atherosclerosis. Aortic Atherosclerosis (ICD10-I70.0).  Electronically Signed   By: Abigail Miyamoto M.D.   On: 08/14/2017 09:23   Ct Abdomen Pelvis W Contrast  Result Date: 08/14/2017 CLINICAL DATA:  Staging of endometrial cancer. EXAM: CT CHEST, ABDOMEN, AND PELVIS WITH CONTRAST TECHNIQUE: Multidetector CT imaging of the chest, abdomen and pelvis was performed following the standard protocol during bolus administration of intravenous contrast. CONTRAST:  176m ISOVUE-300 IOPAMIDOL (ISOVUE-300) INJECTION 61% COMPARISON:  03/13/2017 abdominopelvic CT. Chest radiograph 09/27/2014. FINDINGS: CT CHEST FINDINGS Cardiovascular: Aortic and branch vessel atherosclerosis. Tortuous thoracic aorta. Normal heart size, without pericardial effusion. Right coronary artery atherosclerosis. No central pulmonary embolism, on this non-dedicated study. Mediastinum/Nodes: No supraclavicular adenopathy. No mediastinal or hilar adenopathy. Lungs/Pleura: No pleural fluid.  Clear lungs. Musculoskeletal: No acute osseous abnormality. CT ABDOMEN PELVIS FINDINGS Hepatobiliary: Subtle irregular hepatic capsule, including on image 57/series 2. No focal liver lesion. Cholecystectomy, without biliary ductal dilatation. Pancreas: Normal, without mass or ductal dilatation. Spleen: Normal in size, without focal abnormality. Adrenals/Urinary Tract: Normal kidneys, without hydronephrosis. Normal urinary bladder. Stomach/Bowenormal stomach, without wall thickening. Normal colon and terminal ileum. Normal small bowel. Vascular/Lymphatic: Aortic and branch vessel atherosclerosis. No abdominopelvic adenopathy. Reproductive: Hysterectomy. Fluid in the left hemipelvis, likely adnexal, measures 4.4 x 3.8 cm on image 95/series 2. Other: Minimal free fluid in the pelvis. Anterior pelvic wall edema is likely postoperative. No well-defined fluid collection. Musculoskeletal: Degenerate disc disease at L4-5 and L5-S1. IMPRESSION: 1. Interval hysterectomy. No findings of recurrent or metastatic disease. 2. Small volume  pelvic fluid and a presumed postoperative seroma or lymphangioma within the left hemipelvis. 3. Subtle irregular hepatic capsule is suspicious for mild cirrhosis. Correlate with risk factors. 4. Coronary artery atherosclerosis. Aortic Atherosclerosis (ICD10-I70.0). Electronically Signed   By: KAbigail MiyamotoM.D.   On: 08/14/2017 09:23    ASSESSMENT & PLAN:  Endometrial adenocarcinoma ([facility] We have reviewed CT imaging together. The abnormality seen in the pelvis likely postoperative seroma. We discussed the role of adjuvant treatment. Due to high stage disease, we discussed the benefit of of adjuvant chemotherapy. I will get pathologist to add extra testing [facility] as part of the MSI testing. She also has strong family history of ovarian cancer in  her mother.  If she has abnormal mutation that could put her at risk of Lynch syndrome, she might benefit from genetic counseling We discussed port placement and she agreed for port placement I will schedule chemo education class and see her back for final chemotherapy consent I plan to start her on chemotherapy within the month of recent surgery.   Hyperlipidemia associated with type 2 diabetes mellitus ([place]) She has type 2 diabetes without complications I suspect her blood sugar will run high with premedications of steroid during treatment We will discuss further about reducing premedication dexamethasone for chemotherapy.  Easy bruising I do not believe the patient had remote history of DVT I suspect she might have superficial thrombophlebitis after vein stripping Multiple ultrasound venous Doppler from 2014 in 2015 excluded DVT She has excessive bruising I recommend reducing aspirin to 81 mg daily  Orders Placed This Encounter  Procedures  . IR FLUORO GUIDE PORT INSERTION RIGHT    Standing Status:   Future    Standing Expiration Date:   10/17/2018    Order Specific Question:   Reason for Exam (SYMPTOM  OR DIAGNOSIS  REQUIRED)    Answer:   need port to start chemo by 3/5    Order Specific Question:   Preferred Imaging Location?    Answer:   [facility]    All questions were answered. The patient knows to call the clinic with any problems, questions or concerns. I spent 55 minutes counseling the patient face to face. The total time spent in the appointment was 60 minutes and more than 50% was on counseling.     Heath Lark, MD 08/18/2017 2:59 PM

## 2017-08-18 NOTE — Assessment & Plan Note (Addendum)
We have reviewed CT imaging together. The abnormality seen in the pelvis likely postoperative seroma. We discussed the role of adjuvant treatment. Due to high stage disease, we discussed the benefit of of adjuvant chemotherapy. I will get pathologist to add extra testing due to abnormal [facility] detected as part of the MSI testing. She also has strong family history of ovarian cancer in  her mother. If she has abnormal mutation that could put her at risk of Lynch syndrome, she might benefit from genetic counseling We discussed port placement and she agreed for port placement I will schedule chemo education class and see her back for final chemotherapy consent I plan to start her on chemotherapy within the month of recent surgery.

## 2017-08-18 NOTE — Telephone Encounter (Signed)
Called pathology & added BRAF mutation & [facility]! hypermethylation test to samp;le [id number].

## 2017-08-18 NOTE — Progress Notes (Signed)
START ON PATHWAY REGIMEN - Uterine     A cycle is every 21 days:     Paclitaxel      Carboplatin   **Always confirm dose/schedule in your pharmacy ordering system**    Patient Characteristics: Endometrioid Histology, Newly Diagnosed, Resected, Stage IIIA - Grade 1, 2, or 3 Histology: Endometrioid Histology Therapeutic Status: Newly Diagnosed AJCC T Category: T3 AJCC N Category: N0 AJCC M Category: M0 AJCC 8 Stage Grouping: III Would you be surprised if this patient died  in the next year<= I would be surprised if this patient died in the next year Surgical Status: Resected Intent of Therapy: Curative Intent, Discussed with Patient

## 2017-08-20 ENCOUNTER — Inpatient Hospital Stay: Payer: Medicare Other | Admitting: Gynecologic Oncology

## 2017-08-20 ENCOUNTER — Telehealth: Payer: Self-pay | Admitting: Hematology and Oncology

## 2017-08-20 ENCOUNTER — Encounter: Payer: Self-pay | Admitting: Gynecologic Oncology

## 2017-08-20 VITALS — BP 142/70 | HR 59 | Temp 97.5°F | Resp 18 | Wt 173.2 lb

## 2017-08-20 DIAGNOSIS — C541 Malignant neoplasm of endometrium: Secondary | ICD-10-CM | POA: Diagnosis not present

## 2017-08-20 DIAGNOSIS — Z90710 Acquired absence of both cervix and uterus: Secondary | ICD-10-CM | POA: Diagnosis not present

## 2017-08-20 DIAGNOSIS — Z8041 Family history of malignant neoplasm of ovary: Secondary | ICD-10-CM | POA: Diagnosis not present

## 2017-08-20 DIAGNOSIS — E785 Hyperlipidemia, unspecified: Secondary | ICD-10-CM | POA: Diagnosis not present

## 2017-08-20 DIAGNOSIS — Z7189 Other specified counseling: Secondary | ICD-10-CM

## 2017-08-20 DIAGNOSIS — R233 Spontaneous ecchymoses: Secondary | ICD-10-CM | POA: Diagnosis not present

## 2017-08-20 DIAGNOSIS — E119 Type 2 diabetes mellitus without complications: Secondary | ICD-10-CM | POA: Diagnosis not present

## 2017-08-20 DIAGNOSIS — C801 Malignant (primary) neoplasm, unspecified: Secondary | ICD-10-CM

## 2017-08-20 NOTE — Patient Instructions (Signed)
Please follow-up as planned with Dr Alvy Bimler for chemotherapy and Dr Sondra Come for radiation.  Dr  Denman George will see you back after completing therapy.

## 2017-08-20 NOTE — Telephone Encounter (Signed)
Called patient regarding march °

## 2017-08-20 NOTE — Progress Notes (Signed)
Consult Note: Gyn-Onc  Consult was requested by Dr. Deatra Ina for the evaluation of Alexandria Irwin 72 y.o. female  CC:  Chief Complaint  Patient presents with  . Endometrial adenocarcinoma [facility])    Assessment/Plan:  Alexandria Irwin  is a 72 y.o.  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  I recommend adjuvant carboplatin and paclitaxel chemotherapy x 6 cycles with adjuvant vaginal brachytherapy for local control. We discussed treatment course and prognosis.  I recommend referral to genetics for counseling and possible testing for Lynch syndrome.   I will see [name] back for surveillance after completing therapy.  HPI: Alexandria Irwin is a 72 year old para 4 who was seen in consultation at the request of Dr. Deatra Ina for grade 1 endometrial cancer.  The patient has a history of postmenopausal bleeding since November 2017.  At this time she saw her primary care doctor who diagnosed a urinary tract infection and treated her intermittently with antibiotics for approximately 3 months.  However given the persistence of the symptoms she then sought evaluation with her gynecologist Dr. Deatra Ina in April 2018.  At this time a transvaginal ultrasound scan was performed which showed some fibroids in the uterus but no thickening of the endometrium.  Pap smear and pelvic examination were unremarkable and therefore it was felt that the source of bleeding was not gynecologic.  She was then sent to Dr. Noah Delaine from [facility] urology who performed an evaluation including a CT scan of the abdomen and pelvis in September 2018.  This showed grossly normal urologic system and no evidence of extrauterine disease however the uterus was slightly 4 cm intrauterine mass concerning for neoplasm.  Dr. Alyson Ingles then performed a cystoscopy which was also unremarkable.  She was referred back to Dr. Deatra Ina for further evaluation.  In October  2018 Dr. Deatra Ina repeated the pelvic ultrasound scan which again confirmed what appeared to be a thin endometrium with fibroids.  However given the persistence of bleeding and the CT findings he took the patient to the operating room on June 13, 2017 and performed a hysteroscopy D&C and polypectomy.  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  The patient otherwise has medical history significant for a DVT in the right lower extremity approximately 12 years ago that was spontaneous.  She was treated with aspirin 325 mg daily but no anti-thrombotic agents.  She has had no recurrences since that time.  She has a history of type 2 diabetes mellitus with fairly poorly controlled with an HbA1c earlier in 2018 of 9.2%.  At that time she is taking only metformin.  She was then added Januvia.  Follow-up HbA1c in November 2018 had reduced to 7.2%.  The patient reports fasting blood glucose levels between 07/31/1950.  Her primary care doctor Dr. Lenna Gilford manages her blood glucose.  Patient also has hypertension hypercholesterolemia.  She denies a coronary artery disease history.  She has had 4 prior vaginal deliveries.  She has no family history concerning for Lynch syndrome.  She is obese with a height of 5 foot 2 inches and a weight of 174 pounds.  Her prior abdominal surgery includes a laparoscopic cholecystectomy.  She has an umbilical hernia.  Interval Hx:  On 08/05/17 she underwent a robotic assisted total hysterectomy, BSO, sentinel lymph node biopsy.  Surgery also included an umbilical hernia repair.  The final pathology revealed a uterus with a FIGO grade 2  endometrioid adenocarcinoma involving the full-thickness of the posterior myometrium with tumor present at the inked serosal surface.  There was also microscopic involvement of the left fallopian tube.  Sentinel lymph nodes bilaterally were negative as was the cervix.  Lymphovascular space invasion was not identified.  The patient  was staged as having stage III a grade 2 endometrioid adenocarcinoma and was recommended to proceed with adjuvant therapy with carboplatin paclitaxel chemotherapy and vaginal brachytherapy.  She demonstrated loss of mL H1 nuclear expression on her pathology and therefore genetics consultation was recommended.  Current Meds:  Outpatient Encounter Medications as of 08/20/2017  Medication Sig  . acetaminophen (TYLENOL) 500 MG tablet Take 1,000 mg by mouth every 8 (eight) hours as needed for mild pain or headache.  Marland Kitchen aspirin EC 81 MG tablet Take 81 mg by mouth daily.  . Calcium Citrate-Vitamin D ([employer] CALCIUM CITRATE +D PO) Take 2 tablets by mouth every evening.   . Cholecalciferol (D 2000) 2000 units TABS Take 2,000 Units by mouth daily.  . cloNIDine (CATAPRES) 0.1 MG tablet TAKE 1 TABLET BY MOUTH TWICE DAILY  . cyanocobalamin (,VITAMIN B-12,) 1000 MCG/ML injection Inject 1,000 mcg into the muscle every Thursday.  . cycloSPORINE (RESTASIS) 0.05 % ophthalmic emulsion Place 1 drop into both eyes 2 (two) times daily.   . Famotidine (PEPCID AC PO) Take by mouth every morning.  . furosemide (LASIX) 40 MG tablet TAKE 1 TABLET BY MOUTH ONCE DAILY  . glucose blood (ONETOUCH VERIO) test strip USE TO CHECK BLOOD SUGAR DAILY  . levothyroxine (SYNTHROID, LEVOTHROID) 100 MCG tablet TAKE 1 TABLET BY MOUTH ONCE DAILY (Patient taking differently: TAKE 1 TABLET BY MOUTH ONCE DAILY--- takes in am)  . losartan (COZAAR) 100 MG tablet TAKE 1 TABLET BY MOUTH ONCE DAILY  . Magnesium 250 MG TABS Take 250 mg by mouth every evening.   . metFORMIN (GLUCOPHAGE) 500 MG tablet TAKE 2 TABLETS BY MOUTH ONCE DAILY IN THE MORNING AND TAKE 1 TABLET ONCE DAILY IN THE EVENING (Patient taking differently: Take 500-1,000 mg by mouth 2 (two) times daily with a meal. TAKE 2 TABLETS BY MOUTH ONCE DAILY IN THE MORNING AND TAKE 1 TABLET ONCE DAILY IN THE EVENING)  . metoprolol succinate (TOPROL-XL) 50 MG 24 hr tablet TAKE ONE TABLET BY MOUTH  TWICE DAILY WITH  OR  IMMEDIATELY  FOLLOWING  A  MEAL  . Omega-3 Fatty Acids (FISH OIL) 1000 MG CAPS Take 1,000 mg by mouth every evening.  Glory Rosebush DELICA LANCETS 65K MISC USE ONE  TO CHECK GLUCOSE ONCE DAILY  . simvastatin (ZOCOR) 40 MG tablet TAKE 1 TABLET BY MOUTH ONCE DAILY AT 6PM  . sitaGLIPtin (JANUVIA) 50 MG tablet Take 1 tablet (50 mg total) by mouth daily.  . vitamin C (ASCORBIC ACID) 500 MG tablet Take 500 mg by mouth daily.  Marland Kitchen albuterol (PROVENTIL HFA;VENTOLIN HFA) 108 (90 BASE) MCG/ACT inhaler Inhale 2 puffs into the lungs every 6 (six) hours as needed for wheezing or shortness of breath. (Patient not taking: Reported on 08/20/2017)  . meclizine (ANTIVERT) 50 MG tablet Take 1 tablet (50 mg total) by mouth 2 (two) times daily as needed. (Patient not taking: Reported on 08/20/2017)  . [DISCONTINUED] aspirin 325 MG EC tablet Take 325 mg by mouth daily.  . [DISCONTINUED] ibuprofen (ADVIL,MOTRIN) 800 MG tablet Take 1 tablet (800 mg total) by mouth every 6 (six) hours.  . [DISCONTINUED] metoprolol (LOPRESSOR) 50 MG tablet TAKE 1 TABLET (50 MG TOTAL) BY MOUTH 2 (  TWO) TIMES DAILY.  . [DISCONTINUED] oxyCODONE (OXY IR/ROXICODONE) 5 MG immediate release tablet Take 1 tablet (5 mg total) by mouth every 4 (four) hours as needed for severe pain or breakthrough pain.  . [DISCONTINUED] senna-docusate (SENOKOT-S) 8.6-50 MG tablet Take 2 tablets by mouth at bedtime.   Facility-Administered Encounter Medications as of 08/20/2017  Medication  . cyanocobalamin ((VITAMIN B-12)) injection 1,000 mcg    Allergy:  Allergies  Allergen Reactions  . Ace Inhibitors Cough  . Norvasc [Amlodipine] Cough  . Latex Rash  . Neosporin [Neomycin-Bacitracin Zn-Polymyx] Rash  . Sulfa Antibiotics Rash    Social Hx:   Social History   Socioeconomic History  . Marital status: Married    Spouse name: Not on file  . Number of children: Not on file  . Years of education: Not on file  . Highest education level: Not  on file  Social Needs  . Financial resource strain: Not on file  . Food insecurity - worry: Not on file  . Food insecurity - inability: Not on file  . Transportation needs - medical: Not on file  . Transportation needs - non-medical: Not on file  Occupational History  . Occupation: retired  Tobacco Use  . Smoking status: Never Smoker  . Smokeless tobacco: Never Used  Substance and Sexual Activity  . Alcohol use: No  . Drug use: No  . Sexual activity: Yes    Birth control/protection: Post-menopausal  Other Topics Concern  . Not on file  Social History Narrative  . Not on file    Past Surgical Hx:  Past Surgical History:  Procedure Laterality Date  . BREAST BIOPSY Left 2009   benign  . CATARACT EXTRACTION W/ INTRAOCULAR LENS IMPLANT Left 2018  . CATARACT EXTRACTION W/PHACO Right 03/14/2014   Procedure: CATARACT EXTRACTION PHACO AND INTRAOCULAR LENS PLACEMENT (IOC);  Surgeon: Tonny Branch, MD;  Location: [facility];  Service: Ophthalmology;  Laterality: Right;  CDE: 8.34  . FOOT GANGLION EXCISION Right 2013  . HYSTEROSCOPY W/D&C N/A 06/13/2017   Procedure: DILATATION AND CURETTAGE /HYSTEROSCOPY;  Surgeon: Alden Hipp, MD;  Location: [facility];  Service: Gynecology;  Laterality: N/A;  . LAPAROSCOPIC CHOLECYSTECTOMY  2005  . ROBOTIC ASSISTED TOTAL HYSTERECTOMY WITH BILATERAL SALPINGO OOPHERECTOMY Bilateral 08/05/2017   Procedure: ROBOTIC ASSISTED TOTAL HYSTERECTOMY WITH BILATERAL SALPINGO OOPHORECTOMY;  Surgeon: Everitt Amber, MD;  Location: [facility];  Service: Gynecology;  Laterality: Bilateral;  . SENTINEL NODE BIOPSY Bilateral 08/05/2017   Procedure: SENTINEL NODE BIOPSY;  Surgeon: Everitt Amber, MD;  Location: [facility];  Service: Gynecology;  Laterality: Bilateral;  . UMBILICAL HERNIA REPAIR N/A 08/05/2017   Procedure: HERNIA REPAIR UMBILICAL ADULT;  Surgeon: Everitt Amber, MD;  Location: [facility];  Service: Gynecology;  Laterality: N/A;  . VEIN SURGERY Right 2011   vein stripping-leg  and laser  treatment    Past Medical Hx:  Past Medical History:  Diagnosis Date  . Cataract   . Endometrial adenocarcinoma ([place])   . Environmental and seasonal allergies   . GERD (gastroesophageal reflux disease)   . History of DVT of lower extremity    early 2000s--- lower right leg  . Hyperlipidemia   . Hypertension   . Hypothyroidism   . Type 2 diabetes mellitus ([place])    followed by pcp  . Umbilical hernia   . Vitamin B 12 deficiency   . Vitamin D deficiency   . Wears glasses     Past Gynecological History:  SVD x 4 No LMP recorded. Patient  is postmenopausal.  Family Hx:  Family History  Problem Relation Age of Onset  . Cancer Mother        ovarian    Review of Systems:  Constitutional  Feels well,    ENT Normal appearing ears and nares bilaterally Skin/Breast  No rash, sores, jaundice, itching, dryness Cardiovascular  No chest pain, shortness of breath, or edema  Pulmonary  No cough or wheeze.  Gastro Intestinal  No nausea, vomitting, or diarrhoea. No bright red blood per rectum, no abdominal pain, change in bowel movement, or constipation.  Genito Urinary  No frequency, urgency, dysuria,no bleeding Musculo Skeletal  No myalgia, arthralgia, joint swelling or pain  Neurologic  No weakness, numbness, change in gait,  Psychology  No depression, anxiety, insomnia.   Vitals:  Blood pressure (!) 142/70, pulse (!) 59, temperature (!) 97.5 F (36.4 C), temperature source Oral, resp. rate 18, weight 173 lb 3.2 oz (78.6 kg), SpO2 98 %.  Physical Exam: WD in NAD Neck  Supple NROM, without any enlargements.  Lymph Node Survey No cervical supraclavicular or inguinal adenopathy Cardiovascular  Pulse normal rate, regularity and rhythm. S1 and S2 normal.  Lungs  Clear to auscultation bilateraly, without wheezes/crackles/rhonchi. Good air movement.  Skin  No rash/lesions/breakdown  Psychiatry  Alert and oriented to person, place, and time  Abdomen  Normoactive bowel  sounds, abdomen soft, non-tender and obese without hernia. Well healed incisions  Back No CVA tenderness Genito Urinary  Vulva/vagina: vaginal cuff healing normally. Some blood in vault.  Rectal  deferred Extremities  No bilateral cyanosis, clubbing or edema.   30 minutes of direct face to face counseling time was spent with the patient. This included discussion about prognosis, therapy recommendations and postoperative side effects and are beyond the scope of routine postoperative care.   Donaciano Eva, MD  08/20/2017, 5:20 PM

## 2017-08-22 ENCOUNTER — Other Ambulatory Visit: Payer: Self-pay | Admitting: Hematology and Oncology

## 2017-08-22 ENCOUNTER — Ambulatory Visit: Payer: Medicare Other | Admitting: Gynecologic Oncology

## 2017-08-22 DIAGNOSIS — C541 Malignant neoplasm of endometrium: Secondary | ICD-10-CM

## 2017-08-25 ENCOUNTER — Other Ambulatory Visit: Payer: Self-pay | Admitting: Radiology

## 2017-08-26 ENCOUNTER — Encounter: Payer: Self-pay | Admitting: Family

## 2017-08-26 ENCOUNTER — Ambulatory Visit (INDEPENDENT_AMBULATORY_CARE_PROVIDER_SITE_OTHER): Payer: Medicare Other | Admitting: *Deleted

## 2017-08-26 ENCOUNTER — Other Ambulatory Visit: Payer: Self-pay | Admitting: Radiology

## 2017-08-26 ENCOUNTER — Ambulatory Visit (INDEPENDENT_AMBULATORY_CARE_PROVIDER_SITE_OTHER): Payer: Medicare Other | Admitting: Family

## 2017-08-26 VITALS — BP 128/80 | HR 54 | Temp 97.7°F | Ht 62.0 in | Wt 171.8 lb

## 2017-08-26 DIAGNOSIS — E559 Vitamin D deficiency, unspecified: Secondary | ICD-10-CM | POA: Diagnosis not present

## 2017-08-26 DIAGNOSIS — E538 Deficiency of other specified B group vitamins: Secondary | ICD-10-CM | POA: Diagnosis not present

## 2017-08-26 DIAGNOSIS — E669 Obesity, unspecified: Secondary | ICD-10-CM

## 2017-08-26 DIAGNOSIS — C541 Malignant neoplasm of endometrium: Secondary | ICD-10-CM | POA: Diagnosis not present

## 2017-08-26 DIAGNOSIS — E119 Type 2 diabetes mellitus without complications: Secondary | ICD-10-CM

## 2017-08-26 DIAGNOSIS — I10 Essential (primary) hypertension: Secondary | ICD-10-CM | POA: Diagnosis not present

## 2017-08-26 DIAGNOSIS — E785 Hyperlipidemia, unspecified: Secondary | ICD-10-CM | POA: Diagnosis not present

## 2017-08-26 DIAGNOSIS — E1169 Type 2 diabetes mellitus with other specified complication: Secondary | ICD-10-CM | POA: Diagnosis not present

## 2017-08-26 DIAGNOSIS — E663 Overweight: Secondary | ICD-10-CM | POA: Insufficient documentation

## 2017-08-26 DIAGNOSIS — E039 Hypothyroidism, unspecified: Secondary | ICD-10-CM

## 2017-08-26 DIAGNOSIS — E1159 Type 2 diabetes mellitus with other circulatory complications: Secondary | ICD-10-CM | POA: Diagnosis not present

## 2017-08-26 LAB — BAYER DCA HB A1C WAIVED: HB A1C (BAYER DCA - WAIVED): 6.8 % (ref <7.0)

## 2017-08-26 NOTE — Patient Instructions (Signed)
Uterine Cancer Uterine cancer is an abnormal growth of cancer tissue (malignant tumor) in the uterus. Unlike noncancerous (benign) tumors, malignant tumors can spread to other parts of the body. Uterine cancer usually occurs after menopause. However, it may also occur around the time that menopause begins. The wall of the uterus has an inner layer of tissue (endometrium) and an outer layer of muscle tissue (myometrium). The most common type of uterine cancer begins in the endometrium (endometrial cancer). Cancer that begins in the myometrium (uterine sarcoma) is very rare. What are the causes? The exact cause of this condition is not known. What increases the risk? You are more likely to develop this condition if you:  Are older than 50.  Have an enlarged endometrium (endometrial hyperplasia).  Use hormone therapy.  Are severely overweight (obese).  Use the medicine tamoxifen.  You are white (Caucasian).  Cannot bear children (are infertile).  Have never been pregnant.  Started menstruating at an age younger than 12 years.  Are older than 52 and are still having menstrual periods.  Have a history of cancer of the ovaries, intestines, or colon or rectum (colorectal cancer).  Have a history of enlarged ovaries with small cysts (polycystic ovarian syndrome).  Have a family history of: ? Uterine cancer. ? Hereditary nonpolyposis colon cancer (HNPCC).  Have diabetes, high blood pressure, thyroid disease, or gallbladder disease.  Use long-term, high-dose birth control pills.  Have been exposed to radiation.  Smoke.  What are the signs or symptoms? Symptoms of this condition include:  Abnormal vaginal bleeding or discharge. Bleeding may start as a watery, blood-streaked flow that gradually contains more blood. This is the most common symptom. If you experience abnormal vaginal bleeding, do not assume that it is part of menopause.  Vaginal bleeding after  menopause.  Unexplained weight loss.  Bleeding between periods.  Urination that is difficult, painful, or more frequent than usual.  A lump (mass) in the vagina.  Pain, bloating, or fullness in the abdomen.  Pain in the pelvic area.  Pain during sex.  How is this diagnosed? This condition may be diagnosed based on:  Your medical history and your symptoms.  A physical and pelvic exam. Your health care provider will feel your pelvis for any growths or enlarged lymph nodes.  Blood and urine tests.  Imaging tests, such as X-rays, CT scans, ultrasound, or MRIs.  A procedure in which a thin, flexible tube with a light and camera on the end is inserted through the vagina and used to look inside the uterus (hysteroscopy).  A Pap test to check for abnormal cells in the lower part of the uterus (cervix) and the upper vagina.  Removing a tissue sample (biopsy) from the uterine lining to check for cancer cells.  Dilation and curettage (D&C). This is a procedure that involves stretching (dilation) the cervix and scraping (curettage) the inside lining of the uterus to get a biopsy and check for cancer cells.  Your cancer will be staged to determine its severity and extent. Staging is an assessment of:  The size of the tumor.  Whether the cancer has spread.  Where the cancer has spread.  The stages of uterine cancer are as follows:  Stage I. The cancer is only found in the uterus.  Stage II. The cancer has spread to the cervix.  Stage III. The cancer has spread outside the uterus, but not outside the pelvis. The cancer may have spread to the lymph nodes in the pelvis.  Lymph nodes are part of your body's disease-fighting (immune) system. Lymph nodes are found in many locations in your body, including the neck, underarm, and groin.  Stage IV. The cancer has spread to other parts of the body, such as the bladder or rectum.  How is this treated? This condition is often treated with  surgery to remove:  The uterus, cervix, fallopian tubes, and ovaries (total hysterectomy).  The uterus and cervix (simple hysterectomy).  The type of hysterectomy you will have depends on the extent of your cancer. Lymph nodes near the uterus may also be removed in some cases. Treatment may also include one or more of the following:  Chemotherapy. This uses medicines to kill the cancer cells and prevent their spread.  Radiation therapy. This uses high-energy rays to kill the cancer cells and prevent the spread of cancer.  Chemoradiation. This is a combination treatment that alternates chemotherapy with radiation treatments to enhance the way radiation works.  Brachytherapy. This involves placing radioactive materials inside the body where the cancer was removed.  Hormone therapy. This includes taking medicines that lower the levels of estrogen in the body.  Follow these instructions at home: Activity  Return to your normal activities as told by your health care provider. Ask your health care provider what activities are safe for you.  Exercise regularly as told by your health care provider.  Do not drive or use heavy machinery while taking prescription pain medicine. General instructions  Take over-the-counter and prescription medicines only as told by your health care provider.  Maintain a healthy diet.  Work with your health care provider to: ? Manage any long-term (chronic) conditions you have, such as diabetes, high blood pressure, thyroid disease, or gallbladder disease. ? Manage any side effects of your treatment.  Do not use any products that contain nicotine or tobacco, such as cigarettes and e-cigarettes. If you need help quitting, ask your health care provider.  Consider joining a support group to help you cope with stress. Your health care provider may be able to recommend a local or online support group.  Keep all follow-up visits as told by your health care  provider. This is important. Where to find more information:  American Cancer Society: https://www.cancer.Miguel Barrera ([place]): https://www.cancer.gov Contact a health care provider if:  You have pain in your pelvis or abdomen that gets worse.  You cannot urinate.  You have abnormal bleeding.  You have a fever. Get help right away if:  You develop sudden or new severe symptoms, such as: ? Heavy bleeding. ? Severe weakness. ? Pain that is severe or does not get better with medicine. Summary  Uterine cancer is an abnormal growth of cancer tissue (malignant tumor) in the uterus. The most common type of uterine cancer begins in the endometrium (endometrial cancer).  This condition is often treated with surgery to remove the uterus, cervix, fallopian tubes, and ovaries (total hysterectomy) or the uterus and cervix (simple hysterectomy).  Work with your health care provider to manage any long-term (chronic) conditions you have, such as diabetes, high blood pressure, thyroid disease, or gallbladder disease.  Consider joining a support group to help you cope with stress. Your health care provider may be able to recommend a local or online support group. This information is not intended to replace advice given to you by your health care provider. Make sure you discuss any questions you have with your health care provider. Document Released: 06/17/2005 Document Revised: 06/14/2016 Document  Reviewed: 06/14/2016 Elsevier Interactive Patient Education  2018 [employer]  

## 2017-08-26 NOTE — Progress Notes (Signed)
Subjective:    Patient ID: Alexandria Irwin, female    DOB: March 16, 1946, 72 y.o.   MRN: [id number]  Pt presents to the office today for chronic follow up. 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 PT is scheduled to have a port placed tomorrow and plans to start Chemotherapy on 07/05/17 and radiation in the next few weeks. PT states she feels overwhelmed at times, but for the most part is doing good.  Diabetes  She presents for her follow-up diabetic visit. She has type 2 diabetes mellitus. Her disease course has been stable. There are no hypoglycemic associated symptoms. Associated symptoms include fatigue. Pertinent negatives for diabetes include no blurred vision, no foot paresthesias and no visual change. There are no hypoglycemic complications. Symptoms are stable. Pertinent negatives for diabetic complications include no CVA, heart disease, nephropathy or peripheral neuropathy. Risk factors for coronary artery disease include diabetes mellitus, dyslipidemia, hypertension and sedentary lifestyle. She is following a generally healthy diet. Her overall blood glucose range is 110-130 mg/dl. Eye exam is current.  Hypertension  This is a chronic problem. The current episode started more than 1 year ago. The problem has been resolved since onset. The problem is controlled. Associated symptoms include malaise/fatigue and peripheral edema ("slight at times"). Pertinent negatives include no blurred vision or shortness of breath. The current treatment provides moderate improvement. There is no history of CAD/MI or CVA. Identifiable causes of hypertension include a thyroid problem.  Hyperlipidemia  This is a chronic problem. The current episode started more than 1 year ago. The problem is controlled. Recent lipid tests were reviewed and are normal. Exacerbating diseases include obesity. Pertinent negatives include no shortness of breath. Current antihyperlipidemic treatment includes statins.  The current treatment provides moderate improvement of lipids. Risk factors for coronary artery disease include dyslipidemia, diabetes mellitus, hypertension and post-menopausal.  Thyroid Problem  Presents for follow-up visit. Symptoms include fatigue and hoarse voice. Patient reports no depressed mood, diarrhea or visual change. The symptoms have been stable. Her past medical history is significant for hyperlipidemia.      Review of Systems  Constitutional: Positive for fatigue and malaise/fatigue.  HENT: Positive for hoarse voice.   Eyes: Negative for blurred vision.  Respiratory: Negative for shortness of breath.   Gastrointestinal: Negative for diarrhea.  All other systems reviewed and are negative.      Objective:   Physical Exam  Constitutional: She is oriented to person, place, and time. She appears well-developed and well-nourished. No distress.  HENT:  Head: Normocephalic and atraumatic.  Right Ear: External ear normal.  Left Ear: External ear normal.  Nose: Nose normal.  Mouth/Throat: Oropharynx is clear and moist.  Eyes: Pupils are equal, round, and reactive to light.  Neck: Normal range of motion. Neck supple. No thyromegaly present.  Cardiovascular: Normal rate, regular rhythm, normal heart sounds and intact distal pulses.  No murmur heard. Pulmonary/Chest: Effort normal and breath sounds normal. No respiratory distress. She has no wheezes.  Abdominal: Soft. Bowel sounds are normal. She exhibits no distension. There is tenderness (related to surgery).  Musculoskeletal: Normal range of motion. She exhibits edema (trace in BLE). She exhibits no tenderness.  Neurological: She is alert and oriented to person, place, and time.  Skin: Skin is warm and dry.  Psychiatric: She has a normal mood and affect. Her behavior is normal. Judgment and thought content normal.  Vitals reviewed.     BP 128/80   Pulse (!) 54   Temp 97.7  F (36.5 C) (Oral)   Ht _0  (1.575 m)    Wt 171 lb 12.8 oz (77.9 kg)   BMI 31.42 kg/m      Assessment & Plan:  1. Hypertension associated with diabetes ([place]) - CMP14+EGFR  2. Type 2 diabetes mellitus without complication, without long-term current use of insulin ([facility]) - Bayer DCA Hb A1c Waived - CMP14+EGFR  3. Hypothyroidism, unspecified type - CMP14+EGFR - TSH  4. Hyperlipidemia associated with type 2 diabetes mellitus ([facility])  - CMP14+EGFR - Lipid panel  5. Endometrial adenocarcinoma ([place])  - CMP14+EGFR  6. Endometrial cancer, FIGO stage IIIA ([place]) - CMP14+EGFR  7. Obesity (BMI 30-39.9)  - CMP14+EGFR  8. Vitamin D deficiency  - CMP14+EGFR - VITAMIN D 25 Hydroxy (Vit-D Deficiency, Fractures)   Continue all meds Labs pending Health Maintenance reviewed Diet and exercise encouraged RTO 6 months   Evelina Dun, FNP

## 2017-08-26 NOTE — Progress Notes (Signed)
Pt given cyanocobalamin inj Tolerated well 

## 2017-08-27 ENCOUNTER — Other Ambulatory Visit: Payer: Self-pay | Admitting: Hematology and Oncology

## 2017-08-27 ENCOUNTER — Ambulatory Visit (HOSPITAL_COMMUNITY)
Admission: RE | Admit: 2017-08-27 | Discharge: 2017-08-27 | Disposition: A | Discharge status: Home / Self Care | Payer: Medicare Other | Source: Ambulatory Visit | Attending: Hematology and Oncology | Admitting: Hematology and Oncology

## 2017-08-27 ENCOUNTER — Encounter (HOSPITAL_COMMUNITY): Payer: Self-pay

## 2017-08-27 DIAGNOSIS — Z86718 Personal history of other venous thrombosis and embolism: Secondary | ICD-10-CM | POA: Diagnosis not present

## 2017-08-27 DIAGNOSIS — E559 Vitamin D deficiency, unspecified: Secondary | ICD-10-CM | POA: Insufficient documentation

## 2017-08-27 DIAGNOSIS — Z5111 Encounter for antineoplastic chemotherapy: Secondary | ICD-10-CM | POA: Diagnosis not present

## 2017-08-27 DIAGNOSIS — I10 Essential (primary) hypertension: Secondary | ICD-10-CM | POA: Insufficient documentation

## 2017-08-27 DIAGNOSIS — Z91040 Latex allergy status: Secondary | ICD-10-CM | POA: Insufficient documentation

## 2017-08-27 DIAGNOSIS — C541 Malignant neoplasm of endometrium: Secondary | ICD-10-CM

## 2017-08-27 DIAGNOSIS — K219 Gastro-esophageal reflux disease without esophagitis: Secondary | ICD-10-CM | POA: Insufficient documentation

## 2017-08-27 DIAGNOSIS — E039 Hypothyroidism, unspecified: Secondary | ICD-10-CM | POA: Diagnosis not present

## 2017-08-27 DIAGNOSIS — Z882 Allergy status to sulfonamides status: Secondary | ICD-10-CM | POA: Insufficient documentation

## 2017-08-27 DIAGNOSIS — E538 Deficiency of other specified B group vitamins: Secondary | ICD-10-CM | POA: Insufficient documentation

## 2017-08-27 DIAGNOSIS — Z7984 Long term (current) use of oral hypoglycemic drugs: Secondary | ICD-10-CM | POA: Insufficient documentation

## 2017-08-27 DIAGNOSIS — Z7982 Long term (current) use of aspirin: Secondary | ICD-10-CM | POA: Insufficient documentation

## 2017-08-27 DIAGNOSIS — E785 Hyperlipidemia, unspecified: Secondary | ICD-10-CM | POA: Insufficient documentation

## 2017-08-27 DIAGNOSIS — E119 Type 2 diabetes mellitus without complications: Secondary | ICD-10-CM | POA: Insufficient documentation

## 2017-08-27 HISTORY — PX: IR US GUIDE VASC ACCESS RIGHT: IMG2390

## 2017-08-27 HISTORY — PX: IR FLUORO GUIDE PORT INSERTION RIGHT: IMG5741

## 2017-08-27 LAB — CMP14+EGFR
ALT: 16 IU/L (ref 0–32)
AST: 22 IU/L (ref 0–40)
Albumin/Globulin Ratio: 1.8 (ref 1.2–2.2)
Albumin: 4.2 g/dL (ref 3.5–4.8)
Alkaline Phosphatase: 38 IU/L — ABNORMAL LOW (ref 39–117)
BUN/Creatinine Ratio: 14 (ref 12–28)
BUN: 12 mg/dL (ref 8–27)
Bilirubin Total: 0.5 mg/dL (ref 0.0–1.2)
CALCIUM: 9.7 mg/dL (ref 8.7–10.3)
CO2: 25 mmol/L (ref 20–29)
CREATININE: 0.86 mg/dL (ref 0.57–1.00)
Chloride: 101 mmol/L (ref 96–106)
GFR calc Af Amer: 77 mL/min/{1.73_m2} (ref >59)
GFR, EST NON AFRICAN AMERICAN: 67 mL/min/{1.73_m2} (ref >59)
GLOBULIN, TOTAL: 2.4 g/dL (ref 1.5–4.5)
Glucose: 150 mg/dL — ABNORMAL HIGH (ref 65–99)
Potassium: 4.6 mmol/L (ref 3.5–5.2)
SODIUM: 142 mmol/L (ref 134–144)
Total Protein: 6.6 g/dL (ref 6.0–8.5)

## 2017-08-27 LAB — LIPID PANEL
CHOL/HDL RATIO: 3 ratio (ref 0.0–4.4)
Cholesterol, Total: 122 mg/dL (ref 100–199)
HDL: 41 mg/dL (ref >39)
LDL CALC: 49 mg/dL (ref 0–99)
TRIGLYCERIDES: 158 mg/dL — AB (ref 0–149)
VLDL Cholesterol Cal: 32 mg/dL (ref 5–40)

## 2017-08-27 LAB — TSH: TSH: 1.61 u[IU]/mL (ref 0.450–4.500)

## 2017-08-27 LAB — CBC WITH DIFFERENTIAL/PLATELET
BASOS ABS: 0.1 10*3/uL (ref 0.0–0.1)
Basophils Relative: 1 %
EOS PCT: 4 %
Eosinophils Absolute: 0.3 10*3/uL (ref 0.0–0.7)
HEMATOCRIT: 40 % (ref 36.0–46.0)
Hemoglobin: 13.1 g/dL (ref 12.0–15.0)
LYMPHS ABS: 2.5 10*3/uL (ref 0.7–4.0)
LYMPHS PCT: 31 %
MCH: 30.8 pg (ref 26.0–34.0)
MCHC: 32.8 g/dL (ref 30.0–36.0)
MCV: 93.9 fL (ref 78.0–100.0)
Monocytes Absolute: 0.4 10*3/uL (ref 0.1–1.0)
Monocytes Relative: 4 %
NEUTROS ABS: 4.8 10*3/uL (ref 1.7–7.7)
Neutrophils Relative %: 60 %
Platelets: 261 10*3/uL (ref 150–400)
RBC: 4.26 MIL/uL (ref 3.87–5.11)
RDW: 13.8 % (ref 11.5–15.5)
WBC: 7.9 10*3/uL (ref 4.0–10.5)

## 2017-08-27 LAB — VITAMIN D 25 HYDROXY (VIT D DEFICIENCY, FRACTURES): Vit D, 25-Hydroxy: 42.4 ng/mL (ref 30.0–100.0)

## 2017-08-27 LAB — PROTIME-INR
INR: 0.99
Prothrombin Time: 12.9 seconds (ref 11.4–15.2)

## 2017-08-27 MED ORDER — LIDOCAINE-EPINEPHRINE (PF) 2 %-1:200000 IJ SOLN
INTRAMUSCULAR | Status: AC
  Filled 2017-08-27: qty 20

## 2017-08-27 MED ORDER — HEPARIN SOD (PORK) LOCK FLUSH 100 UNIT/ML IV SOLN
INTRAVENOUS | Status: AC
  Filled 2017-08-27: qty 5

## 2017-08-27 MED ORDER — MIDAZOLAM HCL 2 MG/2ML IJ SOLN
INTRAMUSCULAR | Status: AC | PRN
  Administered 2017-08-27: 1 mg via INTRAVENOUS

## 2017-08-27 MED ORDER — FENTANYL CITRATE (PF) 100 MCG/2ML IJ SOLN
INTRAMUSCULAR | Status: AC
  Filled 2017-08-27: qty 2

## 2017-08-27 MED ORDER — FENTANYL CITRATE (PF) 100 MCG/2ML IJ SOLN
INTRAMUSCULAR | Status: AC | PRN
  Administered 2017-08-27: 50 ug via INTRAVENOUS

## 2017-08-27 MED ORDER — LIDOCAINE-EPINEPHRINE (PF) 1 %-1:200000 IJ SOLN
INTRAMUSCULAR | Status: AC | PRN
  Administered 2017-08-27: 10 mL

## 2017-08-27 MED ORDER — CEFAZOLIN SODIUM-DEXTROSE 2-4 GM/100ML-% IV SOLN
INTRAVENOUS | Status: AC
  Filled 2017-08-27: qty 100

## 2017-08-27 MED ORDER — SODIUM CHLORIDE 0.9 % IV SOLN
INTRAVENOUS | Status: DC
  Administered 2017-08-27: 10:00:00 via INTRAVENOUS

## 2017-08-27 MED ORDER — CEFAZOLIN SODIUM-DEXTROSE 2-4 GM/100ML-% IV SOLN
2.0000 g | INTRAVENOUS | Status: AC
  Administered 2017-08-27: 2 g via INTRAVENOUS

## 2017-08-27 MED ORDER — LIDOCAINE-EPINEPHRINE (PF) 1 %-1:200000 IJ SOLN
INTRAMUSCULAR | Status: AC | PRN
  Administered 2017-08-27: 20 mL

## 2017-08-27 MED ORDER — MIDAZOLAM HCL 2 MG/2ML IJ SOLN
INTRAMUSCULAR | Status: AC
  Filled 2017-08-27: qty 4

## 2017-08-27 NOTE — Discharge Instructions (Signed)
Moderate Conscious Sedation, Adult, Care After °These instructions provide you with information about caring for yourself after your procedure. Your health care provider may also give you more specific instructions. Your treatment has been planned according to current medical practices, but problems sometimes occur. Call your health care provider if you have any problems or questions after your procedure. °What can I expect after the procedure? °After your procedure, it is common: °· To feel sleepy for several hours. °· To feel clumsy and have poor balance for several hours. °· To have poor judgment for several hours. °· To vomit if you eat too soon. ° °Follow these instructions at home: °For at least 24 hours after the procedure: ° °· Do not: °? Participate in activities where you could fall or become injured. °? Drive. °? Use heavy machinery. °? Drink alcohol. °? Take sleeping pills or medicines that cause drowsiness. °? Make important decisions or sign legal documents. °? Take care of children on your own. °· Rest. °Eating and drinking °· Follow the diet recommended by your health care provider. °· If you vomit: °? Drink water, juice, or soup when you can drink without vomiting. °? Make sure you have little or no nausea before eating solid foods. °General instructions °· Have a responsible adult stay with you until you are awake and alert. °· Take over-the-counter and prescription medicines only as told by your health care provider. °· If you smoke, do not smoke without supervision. °· Keep all follow-up visits as told by your health care provider. This is important. °Contact a health care provider if: °· You keep feeling nauseous or you keep vomiting. °· You feel light-headed. °· You develop a rash. °· You have a fever. °Get help right away if: °· You have trouble breathing. °This information is not intended to replace advice given to you by your health care provider. Make sure you discuss any questions you have  with your health care provider. °Document Released: 04/07/2013 Document Revised: 11/20/2015 Document Reviewed: 10/07/2015 °[employer]vier Interactive Patient Education © 2018 [employer] °Implanted Port Home Guide °An implanted port is a type of central line that is placed under the skin. Central lines are used to provide IV access when treatment or nutrition needs to be given through a person’s veins. Implanted ports are used for long-term IV access. An implanted port may be placed because: °· You need IV medicine that would be irritating to the small veins in your hands or arms. °· You need long-term IV medicines, such as antibiotics. °· You need IV nutrition for a long period. °· You need frequent blood draws for lab tests. °· You need dialysis. ° °Implanted ports are usually placed in the chest area, but they can also be placed in the upper arm, the abdomen, or the leg. An implanted port has two main parts: °· Reservoir. The reservoir is round and will appear as a small, raised area under your skin. The reservoir is the part where a needle is inserted to give medicines or draw blood. °· Catheter. The catheter is a thin, flexible tube that extends from the reservoir. The catheter is placed into a large vein. Medicine that is inserted into the reservoir goes into the catheter and then into the vein. ° °How will I care for my incision site? °Do not get the incision site wet. Bathe or shower as directed by your health care provider. °How is my port accessed? °Special steps must be taken to access the port: °·   Before the port is accessed, a numbing cream can be placed on the skin. This helps numb the skin over the port site. °· Your health care provider uses a sterile technique to access the port. °? Your health care provider must put on a mask and sterile gloves. °? The skin over your port is cleaned carefully with an antiseptic and allowed to dry. °? The port is gently pinched between sterile gloves, and a needle is  inserted into the port. °· Only "non-coring" port needles should be used to access the port. Once the port is accessed, a blood return should be checked. This helps ensure that the port is in the vein and is not clogged. °· If your port needs to remain accessed for a constant infusion, a clear (transparent) bandage will be placed over the needle site. The bandage and needle will need to be changed every week, or as directed by your health care provider. °· Keep the bandage covering the needle clean and dry. Do not get it wet. Follow your health care provider’s instructions on how to take a shower or bath while the port is accessed. °· If your port does not need to stay accessed, no bandage is needed over the port. ° °What is flushing? °Flushing helps keep the port from getting clogged. Follow your health care provider’s instructions on how and when to flush the port. Ports are usually flushed with saline solution or a medicine called heparin. The need for flushing will depend on how the port is used. °· If the port is used for intermittent medicines or blood draws, the port will need to be flushed: °? After medicines have been given. °? After blood has been drawn. °? As part of routine maintenance. °· If a constant infusion is running, the port may not need to be flushed. ° °How long will my port stay implanted? °The port can stay in for as long as your health care provider thinks it is needed. When it is time for the port to come out, surgery will be done to remove it. The procedure is similar to the one performed when the port was put in. °When should I seek immediate medical care? °When you have an implanted port, you should seek immediate medical care if: °· You notice a bad smell coming from the incision site. °· You have swelling, redness, or drainage at the incision site. °· You have more swelling or pain at the port site or the surrounding area. °· You have a fever that is not controlled with  medicine. ° °This information is not intended to replace advice given to you by your health care provider. Make sure you discuss any questions you have with your health care provider. °Document Released: 06/17/2005 Document Revised: 11/23/2015 Document Reviewed: 02/22/2013 °Elsevier Interactive Patient Education © 2017 [employer] ° °

## 2017-08-27 NOTE — Consult Note (Signed)
Chief Complaint: Patient was seen in consultation today for Port-A-Cath placement   Referring Physician(s): Gorsuch,Ni  Supervising Physician: Sandi Mariscal  Patient Status: [place]  History of Present Illness: Alexandria Irwin is a 72 y.o. 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 She presents today for Port-A-Cath placement for chemotherapy.  Past Medical History:  Diagnosis Date  . Cataract   . Endometrial adenocarcinoma ([place])   . Environmental and seasonal allergies   . GERD (gastroesophageal reflux disease)   . History of DVT of lower extremity    early 2000s--- lower right leg  . Hyperlipidemia   . Hypertension   . Hypothyroidism   . Type 2 diabetes mellitus ([place])    followed by pcp  . Umbilical hernia   . Vitamin B 12 deficiency   . Vitamin D deficiency   . Wears glasses     Past Surgical History:  Procedure Laterality Date  . BREAST BIOPSY Left 2009   benign  . CATARACT EXTRACTION W/ INTRAOCULAR LENS IMPLANT Left 2018  . CATARACT EXTRACTION W/PHACO Right 03/14/2014   Procedure: CATARACT EXTRACTION PHACO AND INTRAOCULAR LENS PLACEMENT (IOC);  Surgeon: Tonny Branch, MD;  Location: [facility];  Service: Ophthalmology;  Laterality: Right;  CDE: 8.34  . FOOT GANGLION EXCISION Right 2013  . HYSTEROSCOPY W/D&C N/A 06/13/2017   Procedure: DILATATION AND CURETTAGE /HYSTEROSCOPY;  Surgeon: Alden Hipp, MD;  Location: [facility];  Service: Gynecology;  Laterality: N/A;  . LAPAROSCOPIC CHOLECYSTECTOMY  2005  . ROBOTIC ASSISTED TOTAL HYSTERECTOMY WITH BILATERAL SALPINGO OOPHERECTOMY Bilateral 08/05/2017   Procedure: ROBOTIC ASSISTED TOTAL HYSTERECTOMY WITH BILATERAL SALPINGO OOPHORECTOMY;  Surgeon: Everitt Amber, MD;  Location: [facility];  Service: Gynecology;  Laterality: Bilateral;  . SENTINEL NODE BIOPSY Bilateral 08/05/2017   Procedure: SENTINEL NODE BIOPSY;  Surgeon: Everitt Amber,  MD;  Location: [facility];  Service: Gynecology;  Laterality: Bilateral;  . UMBILICAL HERNIA REPAIR N/A 08/05/2017   Procedure: HERNIA REPAIR UMBILICAL ADULT;  Surgeon: Everitt Amber, MD;  Location: [facility];  Service: Gynecology;  Laterality: N/A;  . VEIN SURGERY Right 2011   vein stripping-leg  and laser treatment    Allergies: Ace inhibitors; Norvasc [amlodipine]; Latex; Neosporin [neomycin-bacitracin zn-polymyx]; and Sulfa antibiotics  Medications: Prior to Admission medications   Medication Sig Start Date End Date Taking? Authorizing Provider  acetaminophen (TYLENOL) 500 MG tablet Take 1,000 mg by mouth every 8 (eight) hours as needed for mild pain or headache.   Yes [provider]  aspirin EC 81 MG tablet Take 81 mg by mouth daily.   Yes [provider]  Calcium Citrate-Vitamin D ([employer] CALCIUM CITRATE +D PO) Take 2 tablets by mouth every evening.    Yes [provider]  Cholecalciferol (D 2000) 2000 units TABS Take 2,000 Units by mouth daily.   Yes [provider]  cloNIDine (CATAPRES) 0.1 MG tablet TAKE 1 TABLET BY MOUTH TWICE DAILY 06/04/17  Yes Hawks, Christy A, FNP  cyanocobalamin (,VITAMIN B-12,) 1000 MCG/ML injection Inject 1,000 mcg into the muscle every Thursday.   Yes [provider]  cycloSPORINE (RESTASIS) 0.05 % ophthalmic emulsion Place 1 drop into both eyes 2 (two) times daily.    Yes [provider]  furosemide (LASIX) 40 MG tablet TAKE 1 TABLET BY MOUTH ONCE DAILY 07/07/17  Yes Hawks, Crescent A, FNP  glucose blood (ONETOUCH VERIO) test strip USE TO CHECK BLOOD SUGAR DAILY 08/20/16  Yes Wardell Honour,  MD  levothyroxine (SYNTHROID, LEVOTHROID) 100 MCG tablet TAKE 1 TABLET BY MOUTH ONCE DAILY Patient taking differently: TAKE 1 TABLET BY MOUTH ONCE DAILY--- takes in am 04/07/17  Yes Hawks, Christy A, FNP  losartan (COZAAR) 100 MG tablet TAKE 1 TABLET BY MOUTH ONCE DAILY 07/07/17  Yes Hawks, Christy A, FNP  Magnesium 250 MG TABS Take  250 mg by mouth every evening.    Yes [provider]  metFORMIN (GLUCOPHAGE) 500 MG tablet TAKE 2 TABLETS BY MOUTH ONCE DAILY IN THE MORNING AND TAKE 1 TABLET ONCE DAILY IN THE EVENING Patient taking differently: Take 500-1,000 mg by mouth 2 (two) times daily with a meal. TAKE 2 TABLETS BY MOUTH ONCE DAILY IN THE MORNING AND TAKE 1 TABLET ONCE DAILY IN THE EVENING 03/06/17  Yes Hawks, Christy A, FNP  metoprolol succinate (TOPROL-XL) 50 MG 24 hr tablet TAKE ONE TABLET BY MOUTH TWICE DAILY WITH  OR  IMMEDIATELY  FOLLOWING  A  MEAL 01/06/17  Yes Hawks, Christy A, FNP  Omega-3 Fatty Acids (FISH OIL) 1000 MG CAPS Take 1,000 mg by mouth every evening.   Yes [provider]  [facility] DELICA LANCETS 15V MISC USE ONE  TO CHECK GLUCOSE ONCE DAILY 04/07/17  Yes Evelina Dun A, FNP  simvastatin (ZOCOR) 40 MG tablet TAKE 1 TABLET BY MOUTH ONCE DAILY AT 6PM 04/01/17  Yes Hawks, Christy A, FNP  sitaGLIPtin (JANUVIA) 50 MG tablet Take 1 tablet (50 mg total) by mouth daily. 01/23/17  Yes Hawks, Christy A, FNP  vitamin C (ASCORBIC ACID) 500 MG tablet Take 500 mg by mouth daily.   Yes [provider]  albuterol (PROVENTIL HFA;VENTOLIN HFA) 108 (90 BASE) MCG/ACT inhaler Inhale 2 puffs into the lungs every 6 (six) hours as needed for wheezing or shortness of breath. 06/19/13   Erby Pian, FNP  Famotidine (PEPCID AC PO) Take by mouth every morning.    [provider]  meclizine (ANTIVERT) 50 MG tablet Take 1 tablet (50 mg total) by mouth 2 (two) times daily as needed. 11/08/14   Wardell Honour, MD  metoprolol (LOPRESSOR) 50 MG tablet TAKE 1 TABLET (50 MG TOTAL) BY MOUTH 2 (TWO) TIMES DAILY. 09/17/13 12/13/13  Erby Pian, FNP     Family History  Problem Relation Age of Onset  . Cancer Mother        ovarian    Social History   Socioeconomic History  . Marital status: Married    Spouse name: Not on file  . Number of children: Not on file  . Years of education: Not on  file  . Highest education level: Not on file  Social Needs  . Financial resource strain: Not on file  . Food insecurity - worry: Not on file  . Food insecurity - inability: Not on file  . Transportation needs - medical: Not on file  . Transportation needs - non-medical: Not on file  Occupational History  . Occupation: retired  Tobacco Use  . Smoking status: Never Smoker  . Smokeless tobacco: Never Used  Substance and Sexual Activity  . Alcohol use: No  . Drug use: No  . Sexual activity: Yes    Birth control/protection: Post-menopausal  Other Topics Concern  . Not on file  Social History Narrative  . Not on file      Review of Systems denies fever, chest pain, dyspnea, cough, back pain, nausea, vomiting or bleeding.  She does have occasional headaches, and some mild mid lower  abdominal incision site tenderness  Vital Signs: BP (!) 145/73 (BP Location: Right Arm)   Pulse (!) 56   Temp 97.7 F (36.5 C) (Oral)   Resp 16   SpO2 100%   Physical Exam awake, alert.  Chest clear to auscultation bilaterally.  Abdomen soft, positive bowel sounds, mild mid lower abdominal tenderness, no lower extremity edema.  Imaging: Ct Chest W Contrast  Result Date: 08/14/2017 CLINICAL DATA:  Staging of endometrial cancer. EXAM: CT CHEST, ABDOMEN, AND PELVIS WITH CONTRAST TECHNIQUE: Multidetector CT imaging of the chest, abdomen and pelvis was performed following the standard protocol during bolus administration of intravenous contrast. CONTRAST:  156mL ISOVUE-300 IOPAMIDOL (ISOVUE-300) INJECTION 61% COMPARISON:  03/13/2017 abdominopelvic CT. Chest radiograph 09/27/2014. FINDINGS: CT CHEST FINDINGS Cardiovascular: Aortic and branch vessel atherosclerosis. Tortuous thoracic aorta. Normal heart size, without pericardial effusion. Right coronary artery atherosclerosis. No central pulmonary embolism, on this non-dedicated study. Mediastinum/Nodes: No supraclavicular adenopathy. No mediastinal or hilar  adenopathy. Lungs/Pleura: No pleural fluid.  Clear lungs. Musculoskeletal: No acute osseous abnormality. CT ABDOMEN PELVIS FINDINGS Hepatobiliary: Subtle irregular hepatic capsule, including on image 57/series 2. No focal liver lesion. Cholecystectomy, without biliary ductal dilatation. Pancreas: Normal, without mass or ductal dilatation. Spleen: Normal in size, without focal abnormality. Adrenals/Urinary Tract: Normal kidneys, without hydronephrosis. Normal urinary bladder. Stomach/Bowenormal stomach, without wall thickening. Normal colon and terminal ileum. Normal small bowel. Vascular/Lymphatic: Aortic and branch vessel atherosclerosis. No abdominopelvic adenopathy. Reproductive: Hysterectomy. Fluid in the left hemipelvis, likely adnexal, measures 4.4 x 3.8 cm on image 95/series 2. Other: Minimal free fluid in the pelvis. Anterior pelvic wall edema is likely postoperative. No well-defined fluid collection. Musculoskeletal: Degenerate disc disease at L4-5 and L5-S1. IMPRESSION: 1. Interval hysterectomy. No findings of recurrent or metastatic disease. 2. Small volume pelvic fluid and a presumed postoperative seroma or lymphangioma within the left hemipelvis. 3. Subtle irregular hepatic capsule is suspicious for mild cirrhosis. Correlate with risk factors. 4. Coronary artery atherosclerosis. Aortic Atherosclerosis (ICD10-I70.0). Electronically Signed   By: Abigail Miyamoto M.D.   On: 08/14/2017 09:23   Ct Abdomen Pelvis W Contrast  Result Date: 08/14/2017 CLINICAL DATA:  Staging of endometrial cancer. EXAM: CT CHEST, ABDOMEN, AND PELVIS WITH CONTRAST TECHNIQUE: Multidetector CT imaging of the chest, abdomen and pelvis was performed following the standard protocol during bolus administration of intravenous contrast. CONTRAST:  170mL ISOVUE-300 IOPAMIDOL (ISOVUE-300) INJECTION 61% COMPARISON:  03/13/2017 abdominopelvic CT. Chest radiograph 09/27/2014. FINDINGS: CT CHEST FINDINGS Cardiovascular: Aortic and branch  vessel atherosclerosis. Tortuous thoracic aorta. Normal heart size, without pericardial effusion. Right coronary artery atherosclerosis. No central pulmonary embolism, on this non-dedicated study. Mediastinum/Nodes: No supraclavicular adenopathy. No mediastinal or hilar adenopathy. Lungs/Pleura: No pleural fluid.  Clear lungs. Musculoskeletal: No acute osseous abnormality. CT ABDOMEN PELVIS FINDINGS Hepatobiliary: Subtle irregular hepatic capsule, including on image 57/series 2. No focal liver lesion. Cholecystectomy, without biliary ductal dilatation. Pancreas: Normal, without mass or ductal dilatation. Spleen: Normal in size, without focal abnormality. Adrenals/Urinary Tract: Normal kidneys, without hydronephrosis. Normal urinary bladder. Stomach/Bowenormal stomach, without wall thickening. Normal colon and terminal ileum. Normal small bowel. Vascular/Lymphatic: Aortic and branch vessel atherosclerosis. No abdominopelvic adenopathy. Reproductive: Hysterectomy. Fluid in the left hemipelvis, likely adnexal, measures 4.4 x 3.8 cm on image 95/series 2. Other: Minimal free fluid in the pelvis. Anterior pelvic wall edema is likely postoperative. No well-defined fluid collection. Musculoskeletal: Degenerate disc disease at L4-5 and L5-S1. IMPRESSION: 1. Interval hysterectomy. No findings of recurrent or metastatic disease. 2. Small volume pelvic fluid and a presumed  postoperative seroma or lymphangioma within the left hemipelvis. 3. Subtle irregular hepatic capsule is suspicious for mild cirrhosis. Correlate with risk factors. 4. Coronary artery atherosclerosis. Aortic Atherosclerosis (ICD10-I70.0). Electronically Signed   By: Abigail Miyamoto M.D.   On: 08/14/2017 09:23    Labs:  CBC: Recent Labs    05/26/17 0912 06/05/17 1355 07/30/17 0942 08/06/17 0608  WBC 7.4 7.5 8.5 7.2  HGB 12.4 12.8 12.5 10.6*  HCT 36.8 38.6 37.4 31.3*  PLT 209 218 247 199    COAGS: No results for input(s): INR, APTT in the last  8760 hours.  BMP: Recent Labs    06/05/17 1355 07/30/17 0942 08/06/17 0608 08/26/17 0908  NA 138 138 139 142  K 4.7 4.6 3.9 4.6  CL 100* 100* 104 101  CO2 28 29 29 25   GLUCOSE 127* 152* 159* 150*  BUN 11 15 8 12   CALCIUM 9.9 10.0 8.7* 9.7  CREATININE 0.71 0.76 0.72 0.86  GFRNONAA >60 >60 >60 67  GFRAA >60 >60 >60 77    LIVER FUNCTION TESTS: Recent Labs    01/20/17 0912 05/26/17 0912 07/30/17 0942 08/26/17 0908  BILITOT 0.6 0.5 0.5 0.5  AST 30 17 27 22   ALT 26 15 19 16   ALKPHOS 38* 36* 42 38*  PROT 6.5 6.5 7.5 6.6  ALBUMIN 4.1 4.2 4.0 4.2    TUMOR MARKERS: No results for input(s): AFPTM, CEA, CA199, CHROMGRNA in the last 8760 hours.  Assessment and Plan: 72 y.o. 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 She presents today for Port-A-Cath placement for chemotherapy.Risks and benefits discussed with the patient/son including, but not limited to bleeding, infection, pneumothorax, or fibrin sheath development and need for additional procedures.  All of the patient's questions were answered, patient is agreeable to proceed. Consent signed and in chart.     Thank you for this interesting consult.  I greatly enjoyed meeting Alexandria Irwin and look forward to participating in their care.  A copy of this report was sent to the requesting provider on this date.  Electronically Signed: D. Rowe Robert, PA-C 08/27/2017, 9:40 AM   I spent a total of 25 minutes    in face to face in clinical consultation, greater than 50% of which was counseling/coordinating care for Port-A-Cath placement

## 2017-08-27 NOTE — Procedures (Signed)
Pre Procedure Dx: Endometrial Cancer Post Procedural Dx: Same  Successful placement of right IJ approach port-a-cath with tip at the superior caval atrial junction. The catheter is ready for immediate use.  Estimated Blood Loss: Minimal  Complications: None immediate.  Jay Lodie Waheed, MD Pager #: [phone]   

## 2017-08-28 ENCOUNTER — Telehealth: Payer: Self-pay

## 2017-08-28 ENCOUNTER — Encounter (HOSPITAL_COMMUNITY): Payer: Self-pay

## 2017-08-28 ENCOUNTER — Other Ambulatory Visit: Payer: Self-pay | Admitting: Hematology and Oncology

## 2017-08-28 NOTE — Telephone Encounter (Signed)
-----   Message from Heath Lark, MD sent at 08/28/2017  8:04 AM EST ----- Regarding: OK to cancel labs tomorrow I saw labs in the computer from 2/26 and 2/27. OK to cancel lab appt tomorrow. I will use those labs from chemo. Please let her know no need to come in early and pls cancel the appt

## 2017-08-28 NOTE — Telephone Encounter (Signed)
Called with below message. Verbalized understanding. 

## 2017-08-29 ENCOUNTER — Inpatient Hospital Stay: Payer: Medicare Other | Attending: Hematology and Oncology

## 2017-08-29 ENCOUNTER — Encounter: Payer: Self-pay | Admitting: *Deleted

## 2017-08-29 ENCOUNTER — Inpatient Hospital Stay (HOSPITAL_BASED_OUTPATIENT_CLINIC_OR_DEPARTMENT_OTHER): Payer: Medicare Other | Admitting: Hematology and Oncology

## 2017-08-29 ENCOUNTER — Other Ambulatory Visit: Payer: Medicare Other

## 2017-08-29 ENCOUNTER — Encounter: Payer: Self-pay | Admitting: Hematology and Oncology

## 2017-08-29 VITALS — BP 138/70 | HR 56 | Temp 98.0°F | Resp 17 | Ht 62.0 in | Wt 170.8 lb

## 2017-08-29 DIAGNOSIS — C541 Malignant neoplasm of endometrium: Secondary | ICD-10-CM

## 2017-08-29 DIAGNOSIS — Z5111 Encounter for antineoplastic chemotherapy: Secondary | ICD-10-CM | POA: Insufficient documentation

## 2017-08-29 DIAGNOSIS — Z5189 Encounter for other specified aftercare: Secondary | ICD-10-CM | POA: Diagnosis not present

## 2017-08-29 DIAGNOSIS — E119 Type 2 diabetes mellitus without complications: Secondary | ICD-10-CM | POA: Diagnosis not present

## 2017-08-29 MED ORDER — LIDOCAINE-PRILOCAINE 2.5-2.5 % EX CREA: TOPICAL_CREAM | CUTANEOUS | 3 refills | Status: DC

## 2017-08-29 MED ORDER — DEXAMETHASONE 4 MG PO TABS: ORAL_TABLET | ORAL | 0 refills | Status: DC

## 2017-08-29 MED ORDER — PROCHLORPERAZINE MALEATE 10 MG PO TABS: 10.0000 mg | ORAL_TABLET | Freq: Four times a day (QID) | ORAL | 1 refills | Status: DC | PRN

## 2017-08-29 MED ORDER — ONDANSETRON HCL 8 MG PO TABS: 8.0000 mg | ORAL_TABLET | Freq: Two times a day (BID) | ORAL | 1 refills | Status: DC | PRN

## 2017-08-29 NOTE — Assessment & Plan Note (Signed)
She has type 2 diabetes without complications I suspect her blood sugar will run high with premedications of steroid during treatment We will discuss further about reducing premedication dexamethasone for chemotherapy.

## 2017-08-29 NOTE — Progress Notes (Signed)
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 The intent is of curative intent.  We discussed some of the risks, benefits, side-effects of carboplatin & Taxol. Treatment is intravenous, every 3 weeks x 6 cycles  Some of the short term side-effects included, though not limited to, including weight loss, life threatening infections, risk of allergic reactions, need for transfusions of blood products, nausea, vomiting, change in bowel habits, loss of hair, admission to hospital for various reasons, and risks of death.   Long term side-effects are also discussed including risks of infertility, permanent damage to nerve function, hearing loss, chronic fatigue, kidney damage with possibility needing hemodialysis, and rare secondary malignancy including bone marrow disorders.  The patient is aware that the response rates discussed earlier is not guaranteed.  After a long discussion, patient made an informed decision to proceed with the prescribed plan of care.   Patient education material was dispensed. We discussed premedication with dexamethasone before chemotherapy. I plan to reduce premedication dexamethasone due to her diabetes Genetic counselor appointment has been scheduled Recommend G-CSF support with each dose of chemotherapy  Diabetes (Media) She has type 2 diabetes without complications I suspect her blood sugar will run high with premedications of steroid during treatment We will discuss further about reducing premedication dexamethasone for chemotherapy.   No orders of the defined types were placed in this encounter.   INTERVAL HISTORY: Please see below for problem oriented charting. She returns with family members for chemotherapy consent She feels well No recent nausea,  changes in bowel habits or abdominal pain.  SUMMARY OF ONCOLOGIC HISTORY: Oncology History   endometroid MSI by IHC showed loss of nuclear expression, BRAF neg     Endometrial adenocarcinoma ([place])   06/13/2017 Procedure    PROCEDURE: Hysteroscopy, D&C  SURGEON: Richard D. Deatra Ina M.D.  FINDINGS: Hypertrophic endometrial tissue, no polyps, no myoma         06/13/2017 Pathology Results    Endometrium, curettage ENDOMETRIOID ADENOCARCINOMA, FIGO GRADE 1      08/05/2017 Pathology Results    1. Lymph node, sentinel, biopsy, right external iliac - NO CARCINOMA IDENTIFIED IN ONE LYMPH NODE (0/1) - SEE COMMENT 2. Lymph node, sentinel, biopsy, left external iliac #1 - NO CARCINOMA IDENTIFIED IN ONE LYMPH NODE (0/1) - SEE COMMENT 3. Lymph node, sentinel, biopsy, left external iliac #2 - NO CARCINOMA IDENTIFIED IN ONE LYMPH NODE (0/1) - SEE COMMENT 4. Uterus +/- tubes/ovaries, neoplastic, ovaries UTERUS: - ENDOMETRIOID CARCINOMA, FIGO GRADE 2, INVOLVING THE ENTIRE POSTERIOR MYOMETRIUM WITH TUMOR PRESENT AT INKED SEROSAL SURFACE - SEE ONCOLOGY TABLE BELOW CERVIX: - NABOTHIAN CYSTS - NO DYSPLASIA OR MALIGNANCY IDENTIFIED BILATERAL OVARIES: - NO CARCINOMA IDENTIFIED BILATERAL FALLOPIAN TUBES: - ENDOMETRIOID CARCINOMA INVOLVING LEFT FALLOPIAN TUBE  1. , 2 and 3. Cytokeratin AE1/3 was performed on the sentinel lymph nodes to exclude micrometastasis; they are negative. 4. ONCOLOGY TABLE-UTERUS, CARCINOMA OR CARCINOSARCOMA Specimen: Uterus, bilateral fallopian tubes and ovaries Procedure: Total hysterectomy and bilateral salpingo-oophorectomy Histologic type: Endometrioid carcinoma, NOS Grade: FIGO Grade 2 Myometrial invasion: >50% (The posterior myometrium has full thickness involvement with tumor extending to the inked serosal surface) Uterine Serosa involvement: Present Cervical stromal involvement: Not identified Other Tissue/Organ Involvement: Left fallopian  tube Lymphovascular invasion: Not identified Peritoneal washings: Not submitted/unknown Lymph nodes: Examined: 3 Sentinel 0 Non-sentinel  3 Total Lymph nodes with metastasis: 0 Isolated tumor cells (< 0.2 mm): 0 Micrometastasis: (> 0.2 mm and < 2.0 mm): 0 Macrometastasis: (> 2.0 mm): 0 Extracapsular extension: N/A TNM code: pT3a, pN0 FIGO Stage (based on pathologic findings, needs clinical correlation): IIIa *Block 4I is representative of the tumor and suitable for additional studies, if requested. COMMENT: MMR (by immunohistochemistry) is pending and will be reported in an addendum.      08/05/2017 Surgery    Operation: Robotic-assisted laparoscopic total hysterectomy with bilateral salpingoophorectomy, SLN biopsy, umbilical hernia  Surgeon: Donaciano Eva  Operative Findings:  : 6cm normal appearing uterus, normal right tube and ovary, left ovary with cystic mass, left sigmoid colon diverticular disease, adhesions to left ovary.  No gross extrauterine disease.         08/14/2017 Imaging    1. Interval hysterectomy. No findings of recurrent or metastatic disease. 2. Small volume pelvic fluid and a presumed postoperative seroma or lymphangioma within the left hemipelvis. 3. Subtle irregular hepatic capsule is suspicious for mild cirrhosis. Correlate with risk factors. 4. Coronary artery atherosclerosis. Aortic Atherosclerosis (ICD10-I70.0).      08/27/2017 Procedure    Successful placement of a right internal jugular approach power injectable Port-A-Cath. The catheter is ready for immediate use.       REVIEW OF SYSTEMS:   Constitutional: Denies fevers, chills or abnormal weight loss Eyes: Denies blurriness of vision Ears, nose, mouth, throat, and face: Denies mucositis or sore throat Respiratory: Denies cough, dyspnea or wheezes Cardiovascular: Denies palpitation, chest discomfort or lower extremity swelling Gastrointestinal:  Denies nausea, heartburn or change in  bowel habits Skin: Denies abnormal skin rashes Lymphatics: Denies new lymphadenopathy or easy bruising Neurological:Denies numbness, tingling or new weaknesses Behavioral/Psych: Mood is stable, no new changes  All other systems were reviewed with the patient and are negative.  I have reviewed the past medical history, past surgical history, social history and family history with the patient and they are unchanged from previous note.  ALLERGIES:  is allergic to ace inhibitors; norvasc [amlodipine]; latex; neosporin [neomycin-bacitracin zn-polymyx]; and sulfa antibiotics.  MEDICATIONS:  Current Outpatient Medications  Medication Sig Dispense Refill  . acetaminophen (TYLENOL) 500 MG tablet Take 1,000 mg by mouth every 8 (eight) hours as needed for mild pain or headache.    . albuterol (PROVENTIL HFA;VENTOLIN HFA) 108 (90 BASE) MCG/ACT inhaler Inhale 2 puffs into the lungs every 6 (six) hours as needed for wheezing or shortness of breath. 1 Inhaler 1  . aspirin EC 81 MG tablet Take 81 mg by mouth daily.    . Calcium Citrate-Vitamin D ([employer] CALCIUM CITRATE +D PO) Take 2 tablets by mouth every evening.     . Cholecalciferol (D 2000) 2000 units TABS Take 2,000 Units by mouth daily.    . cloNIDine (CATAPRES) 0.1 MG tablet TAKE 1 TABLET BY MOUTH TWICE DAILY 180 tablet 1  . cyanocobalamin (,VITAMIN B-12,) 1000 MCG/ML injection Inject 1,000 mcg into the muscle every Thursday.    . cycloSPORINE (RESTASIS) 0.05 % ophthalmic emulsion Place 1 drop into both eyes 2 (two) times daily.     Marland Kitchen dexamethasone (DECADRON) 4 MG tablet Take 3 tabs at night and 3 tabs at 6 am the morning of chemotherapy, every 3 weeks with food 36 tablet 0  . Famotidine (PEPCID AC PO) Take by mouth every morning.    . furosemide (LASIX) 40 MG tablet TAKE 1 TABLET BY MOUTH ONCE DAILY 90 tablet 0  . glucose blood (  ONETOUCH VERIO) test strip USE TO CHECK BLOOD SUGAR DAILY 100 each 11  . levothyroxine (SYNTHROID, LEVOTHROID) 100 MCG  tablet TAKE 1 TABLET BY MOUTH ONCE DAILY (Patient taking differently: TAKE 1 TABLET BY MOUTH ONCE DAILY--- takes in am) 90 tablet 2  . lidocaine-prilocaine (EMLA) cream Apply to affected area once 30 g 3  . losartan (COZAAR) 100 MG tablet TAKE 1 TABLET BY MOUTH ONCE DAILY 90 tablet 0  . Magnesium 250 MG TABS Take 250 mg by mouth every evening.     . meclizine (ANTIVERT) 50 MG tablet Take 1 tablet (50 mg total) by mouth 2 (two) times daily as needed. 30 tablet 0  . metFORMIN (GLUCOPHAGE) 500 MG tablet TAKE 2 TABLETS BY MOUTH ONCE DAILY IN THE MORNING AND TAKE 1 TABLET ONCE DAILY IN THE EVENING (Patient taking differently: Take 500-1,000 mg by mouth 2 (two) times daily with a meal. TAKE 2 TABLETS BY MOUTH ONCE DAILY IN THE MORNING AND TAKE 1 TABLET ONCE DAILY IN THE EVENING) 270 tablet 1  . metoprolol succinate (TOPROL-XL) 50 MG 24 hr tablet TAKE ONE TABLET BY MOUTH TWICE DAILY WITH  OR  IMMEDIATELY  FOLLOWING  A  MEAL 180 tablet 4  . Omega-3 Fatty Acids (FISH OIL) 1000 MG CAPS Take 1,000 mg by mouth every evening.    . ondansetron (ZOFRAN) 8 MG tablet Take 1 tablet (8 mg total) by mouth 2 (two) times daily as needed for refractory nausea / vomiting. Start on day 3 after chemo. 30 tablet 1  . ONETOUCH DELICA LANCETS 37C MISC USE ONE  TO CHECK GLUCOSE ONCE DAILY 100 each 1  . prochlorperazine (COMPAZINE) 10 MG tablet Take 1 tablet (10 mg total) by mouth every 6 (six) hours as needed (Nausea or vomiting). 30 tablet 1  . simvastatin (ZOCOR) 40 MG tablet TAKE 1 TABLET BY MOUTH ONCE DAILY AT 6PM 90 tablet 1  . sitaGLIPtin (JANUVIA) 50 MG tablet Take 1 tablet (50 mg total) by mouth daily. 90 tablet 1  . vitamin C (ASCORBIC ACID) 500 MG tablet Take 500 mg by mouth daily.     Current Facility-Administered Medications  Medication Dose Route Frequency Provider Last Rate Last Dose  . cyanocobalamin ((VITAMIN B-12)) injection 1,000 mcg  1,000 mcg Intramuscular Daily Evelina Dun A, FNP   1,000 mcg at 08/26/17  5885    PHYSICAL EXAMINATION: ECOG PERFORMANCE STATUS: 1 - Symptomatic but completely ambulatory  Vitals:   08/29/17 1140  BP: 138/70  Pulse: (!) 56  Resp: 17  Temp: 98 F (36.7 C)  SpO2: 98%   Filed Weights   08/29/17 1140  Weight: 170 lb 12.8 oz (77.5 kg)    GENERAL:alert, no distress and comfortable SKIN: skin color, texture, turgor are normal, no rashes or significant lesions EYES: normal, Conjunctiva are pink and non-injected, sclera clear OROPHARYNX:no exudate, no erythema and lips, buccal mucosa, and tongue normal  NECK: supple, thyroid normal size, non-tender, without nodularity LYMPH:  no palpable lymphadenopathy in the cervical, axillary or inguinal LUNGS: clear to auscultation and percussion with normal breathing effort HEART: regular rate & rhythm and no murmurs and no lower extremity edema ABDOMEN:abdomen soft, non-tender and normal bowel sounds Musculoskeletal:no cyanosis of digits and no clubbing.  The port site looks okay NEURO: alert & oriented x 3 with fluent speech, no focal motor/sensory deficits  LABORATORY DATA:  I have reviewed the data as listed    Component Value Date/Time   NA 142 08/26/2017 0908   K  4.6 08/26/2017 0908   CL 101 08/26/2017 0908   CO2 25 08/26/2017 0908   GLUCOSE 150 (H) 08/26/2017 0908   GLUCOSE 159 (H) 08/06/2017 0608   BUN 12 08/26/2017 0908   CREATININE 0.86 08/26/2017 0908   CALCIUM 9.7 08/26/2017 0908   PROT 6.6 08/26/2017 0908   ALBUMIN 4.2 08/26/2017 0908   AST 22 08/26/2017 0908   ALT 16 08/26/2017 0908   ALKPHOS 38 (L) 08/26/2017 0908   BILITOT 0.5 08/26/2017 0908   GFRNONAA 67 08/26/2017 0908   GFRAA 77 08/26/2017 0908    No results found for: SPEP, UPEP  Lab Results  Component Value Date   WBC 7.9 08/27/2017   NEUTROABS 4.8 08/27/2017   HGB 13.1 08/27/2017   HCT 40.0 08/27/2017   MCV 93.9 08/27/2017   PLT 261 08/27/2017      Chemistry      Component Value Date/Time   NA 142 08/26/2017 0908   K  4.6 08/26/2017 0908   CL 101 08/26/2017 0908   CO2 25 08/26/2017 0908   BUN 12 08/26/2017 0908   CREATININE 0.86 08/26/2017 0908      Component Value Date/Time   CALCIUM 9.7 08/26/2017 0908   ALKPHOS 38 (L) 08/26/2017 0908   AST 22 08/26/2017 0908   ALT 16 08/26/2017 0908   BILITOT 0.5 08/26/2017 0908       RADIOGRAPHIC STUDIES: I have personally reviewed the radiological images as listed and agreed with the findings in the report. Ct Chest W Contrast  Result Date: 08/14/2017 CLINICAL DATA:  Staging of endometrial cancer. EXAM: CT CHEST, ABDOMEN, AND PELVIS WITH CONTRAST TECHNIQUE: Multidetector CT imaging of the chest, abdomen and pelvis was performed following the standard protocol during bolus administration of intravenous contrast. CONTRAST:  162m ISOVUE-300 IOPAMIDOL (ISOVUE-300) INJECTION 61% COMPARISON:  03/13/2017 abdominopelvic CT. Chest radiograph 09/27/2014. FINDINGS: CT CHEST FINDINGS Cardiovascular: Aortic and branch vessel atherosclerosis. Tortuous thoracic aorta. Normal heart size, without pericardial effusion. Right coronary artery atherosclerosis. No central pulmonary embolism, on this non-dedicated study. Mediastinum/Nodes: No supraclavicular adenopathy. No mediastinal or hilar adenopathy. Lungs/Pleura: No pleural fluid.  Clear lungs. Musculoskeletal: No acute osseous abnormality. CT ABDOMEN PELVIS FINDINGS Hepatobiliary: Subtle irregular hepatic capsule, including on image 57/series 2. No focal liver lesion. Cholecystectomy, without biliary ductal dilatation. Pancreas: Normal, without mass or ductal dilatation. Spleen: Normal in size, without focal abnormality. Adrenals/Urinary Tract: Normal kidneys, without hydronephrosis. Normal urinary bladder. Stomach/Bowenormal stomach, without wall thickening. Normal colon and terminal ileum. Normal small bowel. Vascular/Lymphatic: Aortic and branch vessel atherosclerosis. No abdominopelvic adenopathy. Reproductive: Hysterectomy. Fluid  in the left hemipelvis, likely adnexal, measures 4.4 x 3.8 cm on image 95/series 2. Other: Minimal free fluid in the pelvis. Anterior pelvic wall edema is likely postoperative. No well-defined fluid collection. Musculoskeletal: Degenerate disc disease at L4-5 and L5-S1. IMPRESSION: 1. Interval hysterectomy. No findings of recurrent or metastatic disease. 2. Small volume pelvic fluid and a presumed postoperative seroma or lymphangioma within the left hemipelvis. 3. Subtle irregular hepatic capsule is suspicious for mild cirrhosis. Correlate with risk factors. 4. Coronary artery atherosclerosis. Aortic Atherosclerosis (ICD10-I70.0). Electronically Signed   By: KAbigail MiyamotoM.D.   On: 08/14/2017 09:23   Ct Abdomen Pelvis W Contrast  Result Date: 08/14/2017 CLINICAL DATA:  Staging of endometrial cancer. EXAM: CT CHEST, ABDOMEN, AND PELVIS WITH CONTRAST TECHNIQUE: Multidetector CT imaging of the chest, abdomen and pelvis was performed following the standard protocol during bolus administration of intravenous contrast. CONTRAST:  1048mISOVUE-300 IOPAMIDOL (ISOVUE-300) INJECTION  61% COMPARISON:  03/13/2017 abdominopelvic CT. Chest radiograph 09/27/2014. FINDINGS: CT CHEST FINDINGS Cardiovascular: Aortic and branch vessel atherosclerosis. Tortuous thoracic aorta. Normal heart size, without pericardial effusion. Right coronary artery atherosclerosis. No central pulmonary embolism, on this non-dedicated study. Mediastinum/Nodes: No supraclavicular adenopathy. No mediastinal or hilar adenopathy. Lungs/Pleura: No pleural fluid.  Clear lungs. Musculoskeletal: No acute osseous abnormality. CT ABDOMEN PELVIS FINDINGS Hepatobiliary: Subtle irregular hepatic capsule, including on image 57/series 2. No focal liver lesion. Cholecystectomy, without biliary ductal dilatation. Pancreas: Normal, without mass or ductal dilatation. Spleen: Normal in size, without focal abnormality. Adrenals/Urinary Tract: Normal kidneys, without  hydronephrosis. Normal urinary bladder. Stomach/Bowenormal stomach, without wall thickening. Normal colon and terminal ileum. Normal small bowel. Vascular/Lymphatic: Aortic and branch vessel atherosclerosis. No abdominopelvic adenopathy. Reproductive: Hysterectomy. Fluid in the left hemipelvis, likely adnexal, measures 4.4 x 3.8 cm on image 95/series 2. Other: Minimal free fluid in the pelvis. Anterior pelvic wall edema is likely postoperative. No well-defined fluid collection. Musculoskeletal: Degenerate disc disease at L4-5 and L5-S1. IMPRESSION: 1. Interval hysterectomy. No findings of recurrent or metastatic disease. 2. Small volume pelvic fluid and a presumed postoperative seroma or lymphangioma within the left hemipelvis. 3. Subtle irregular hepatic capsule is suspicious for mild cirrhosis. Correlate with risk factors. 4. Coronary artery atherosclerosis. Aortic Atherosclerosis (ICD10-I70.0). Electronically Signed   By: Abigail Miyamoto M.D.   On: 08/14/2017 09:23   Ir US Guide Vasc Access Right  Result Date: 08/27/2017 INDICATION: History of metastatic endometrial cancer in need of durable intravenous access for chemotherapy administration. EXAM: IMPLANTED PORT A CATH PLACEMENT WITH ULTRASOUND AND FLUOROSCOPIC GUIDANCE COMPARISON:  Chest CT - 08/14/2017 MEDICATIONS: Ancef 2 gm IV; The antibiotic was administered within an appropriate time interval prior to skin puncture. ANESTHESIA/SEDATION: Moderate (conscious) sedation was employed during this procedure. A total of Versed 1 mg and Fentanyl 50 mcg was administered intravenously. Moderate Sedation Time: 36 minutes. The patient's level of consciousness and vital signs were monitored continuously by radiology nursing throughout the procedure under my direct supervision. CONTRAST:  None FLUOROSCOPY TIME:  2 minutes, 36 seconds (3 mGy) COMPLICATIONS: None immediate. PROCEDURE: The procedure, risks, benefits, and alternatives were explained to the patient.  Questions regarding the procedure were encouraged and answered. The patient understands and consents to the procedure. The right neck and chest were prepped with chlorhexidine in a sterile fashion, and a sterile drape was applied covering the operative field. Maximum barrier sterile technique with sterile gowns and gloves were used for the procedure. A timeout was performed prior to the initiation of the procedure. Local anesthesia was provided with 1% lidocaine with epinephrine. After creating a small venotomy incision, a micropuncture kit was utilized to access the internal jugular vein. Real-time ultrasound guidance was utilized for vascular access including the acquisition of a permanent ultrasound image documenting patency of the accessed vessel. The microwire was utilized to measure appropriate catheter length. A subcutaneous port pocket was then created along the upper chest wall utilizing a combination of sharp and blunt dissection. The pocket was irrigated with sterile saline. A single lumen ISP power injectable port was chosen for placement. The 8 Fr catheter was tunneled from the port pocket site to the venotomy incision. The port was placed in the pocket. The external catheter was trimmed to appropriate length. At the venotomy, an 8 Fr peel-away sheath was placed over a guidewire under fluoroscopic guidance. The catheter was then placed through the sheath and the sheath was removed. Final catheter positioning was confirmed and documented with  a fluoroscopic spot radiograph. The port was accessed with a Huber needle, aspirated and flushed with heparinized saline. The venotomy site was closed with an interrupted 4-0 Vicryl suture. The port pocket incision was closed with interrupted 2-0 Vicryl suture and the skin was opposed with a running subcuticular 4-0 Vicryl suture. Dermabond and Steri-strips were applied to both incisions. Dressings were placed. The patient tolerated the procedure well without  immediate post procedural complication. FINDINGS: After catheter placement, the tip lies within the superior cavoatrial junction. The catheter aspirates and flushes normally and is ready for immediate use. IMPRESSION: Successful placement of a right internal jugular approach power injectable Port-A-Cath. The catheter is ready for immediate use. Electronically Signed   By: Sandi Mariscal M.D.   On: 08/27/2017 13:02   Ir Fluoro Guide Port Insertion Right  Result Date: 08/27/2017 INDICATION: History of metastatic endometrial cancer in need of durable intravenous access for chemotherapy administration. EXAM: IMPLANTED PORT A CATH PLACEMENT WITH ULTRASOUND AND FLUOROSCOPIC GUIDANCE COMPARISON:  Chest CT - 08/14/2017 MEDICATIONS: Ancef 2 gm IV; The antibiotic was administered within an appropriate time interval prior to skin puncture. ANESTHESIA/SEDATION: Moderate (conscious) sedation was employed during this procedure. A total of Versed 1 mg and Fentanyl 50 mcg was administered intravenously. Moderate Sedation Time: 36 minutes. The patient's level of consciousness and vital signs were monitored continuously by radiology nursing throughout the procedure under my direct supervision. CONTRAST:  None FLUOROSCOPY TIME:  2 minutes, 36 seconds (3 mGy) COMPLICATIONS: None immediate. PROCEDURE: The procedure, risks, benefits, and alternatives were explained to the patient. Questions regarding the procedure were encouraged and answered. The patient understands and consents to the procedure. The right neck and chest were prepped with chlorhexidine in a sterile fashion, and a sterile drape was applied covering the operative field. Maximum barrier sterile technique with sterile gowns and gloves were used for the procedure. A timeout was performed prior to the initiation of the procedure. Local anesthesia was provided with 1% lidocaine with epinephrine. After creating a small venotomy incision, a micropuncture kit was utilized to  access the internal jugular vein. Real-time ultrasound guidance was utilized for vascular access including the acquisition of a permanent ultrasound image documenting patency of the accessed vessel. The microwire was utilized to measure appropriate catheter length. A subcutaneous port pocket was then created along the upper chest wall utilizing a combination of sharp and blunt dissection. The pocket was irrigated with sterile saline. A single lumen ISP power injectable port was chosen for placement. The 8 Fr catheter was tunneled from the port pocket site to the venotomy incision. The port was placed in the pocket. The external catheter was trimmed to appropriate length. At the venotomy, an 8 Fr peel-away sheath was placed over a guidewire under fluoroscopic guidance. The catheter was then placed through the sheath and the sheath was removed. Final catheter positioning was confirmed and documented with a fluoroscopic spot radiograph. The port was accessed with a Huber needle, aspirated and flushed with heparinized saline. The venotomy site was closed with an interrupted 4-0 Vicryl suture. The port pocket incision was closed with interrupted 2-0 Vicryl suture and the skin was opposed with a running subcuticular 4-0 Vicryl suture. Dermabond and Steri-strips were applied to both incisions. Dressings were placed. The patient tolerated the procedure well without immediate post procedural complication. FINDINGS: After catheter placement, the tip lies within the superior cavoatrial junction. The catheter aspirates and flushes normally and is ready for immediate use. IMPRESSION: Successful placement of a right  internal jugular approach power injectable Port-A-Cath. The catheter is ready for immediate use. Electronically Signed   By: Sandi Mariscal M.D.   On: 08/27/2017 13:02    All questions were answered. The patient knows to call the clinic with any problems, questions or concerns. No barriers to learning was  detected.  I spent 25 minutes counseling the patient face to face. The total time spent in the appointment was 30 minutes and more than 50% was on counseling and review of test results  Heath Lark, MD 08/29/2017 12:55 PM

## 2017-08-29 NOTE — Assessment & Plan Note (Signed)
We reviewed the NCCN guidelines We discussed the role of chemotherapy. The intent is of curative intent.  We discussed some of the risks, benefits, side-effects of carboplatin & Taxol. Treatment is intravenous, every 3 weeks x 6 cycles  Some of the short term side-effects included, though not limited to, including weight loss, life threatening infections, risk of allergic reactions, need for transfusions of blood products, nausea, vomiting, change in bowel habits, loss of hair, admission to hospital for various reasons, and risks of death.   Long term side-effects are also discussed including risks of infertility, permanent damage to nerve function, hearing loss, chronic fatigue, kidney damage with possibility needing hemodialysis, and rare secondary malignancy including bone marrow disorders.  The patient is aware that the response rates discussed earlier is not guaranteed.  After a long discussion, patient made an informed decision to proceed with the prescribed plan of care.   Patient education material was dispensed. We discussed premedication with dexamethasone before chemotherapy. I plan to reduce premedication dexamethasone due to her diabetes Genetic counselor appointment has been scheduled Recommend G-CSF support with each dose of chemotherapy

## 2017-08-30 ENCOUNTER — Other Ambulatory Visit: Payer: Self-pay | Admitting: Family

## 2017-09-01 ENCOUNTER — Encounter: Payer: Self-pay | Admitting: Hematology and Oncology

## 2017-09-01 ENCOUNTER — Other Ambulatory Visit: Payer: Self-pay | Admitting: Family Medicine

## 2017-09-01 NOTE — Progress Notes (Signed)
Submitted auth request for Ondansetron and for the Lidocaine-Prilocaine cream today.  Status is pending.

## 2017-09-02 ENCOUNTER — Inpatient Hospital Stay: Payer: Medicare Other

## 2017-09-02 ENCOUNTER — Encounter: Payer: Self-pay | Admitting: Hematology and Oncology

## 2017-09-02 VITALS — BP 97/48 | HR 72 | Temp 98.2°F | Resp 18

## 2017-09-02 DIAGNOSIS — C541 Malignant neoplasm of endometrium: Secondary | ICD-10-CM | POA: Diagnosis not present

## 2017-09-02 DIAGNOSIS — E119 Type 2 diabetes mellitus without complications: Secondary | ICD-10-CM | POA: Diagnosis not present

## 2017-09-02 DIAGNOSIS — Z5189 Encounter for other specified aftercare: Secondary | ICD-10-CM | POA: Diagnosis not present

## 2017-09-02 DIAGNOSIS — Z5111 Encounter for antineoplastic chemotherapy: Secondary | ICD-10-CM | POA: Diagnosis not present

## 2017-09-02 MED ORDER — SODIUM CHLORIDE 0.9 % IV SOLN
Freq: Once | INTRAVENOUS | Status: AC
  Administered 2017-09-02: 09:00:00 via INTRAVENOUS

## 2017-09-02 MED ORDER — DIPHENHYDRAMINE HCL 50 MG/ML IJ SOLN
INTRAMUSCULAR | Status: AC
Start: 2017-09-02 — End: 2017-09-02
  Filled 2017-09-02: qty 1

## 2017-09-02 MED ORDER — DIPHENHYDRAMINE HCL 50 MG/ML IJ SOLN
50.0000 mg | Freq: Once | INTRAMUSCULAR | Status: AC
  Administered 2017-09-02: 50 mg via INTRAVENOUS

## 2017-09-02 MED ORDER — SODIUM CHLORIDE 0.9 % IV SOLN
175.0000 mg/m2 | Freq: Once | INTRAVENOUS | Status: AC
  Administered 2017-09-02: 324 mg via INTRAVENOUS
  Filled 2017-09-02: qty 54

## 2017-09-02 MED ORDER — PALONOSETRON HCL INJECTION 0.25 MG/5ML
INTRAVENOUS | Status: AC
  Filled 2017-09-02: qty 5

## 2017-09-02 MED ORDER — SODIUM CHLORIDE 0.9 % IV SOLN
20.0000 mg | Freq: Once | INTRAVENOUS | Status: AC
  Administered 2017-09-02: 20 mg via INTRAVENOUS
  Filled 2017-09-02: qty 2

## 2017-09-02 MED ORDER — PALONOSETRON HCL INJECTION 0.25 MG/5ML
0.2500 mg | Freq: Once | INTRAVENOUS | Status: AC
  Administered 2017-09-02: 0.25 mg via INTRAVENOUS

## 2017-09-02 MED ORDER — SODIUM CHLORIDE 0.9 % IV SOLN
516.6000 mg | Freq: Once | INTRAVENOUS | Status: AC
  Administered 2017-09-02: 520 mg via INTRAVENOUS
  Filled 2017-09-02: qty 52

## 2017-09-02 MED ORDER — SODIUM CHLORIDE 0.9% FLUSH
10.0000 mL | INTRAVENOUS | Status: DC | PRN
  Administered 2017-09-02: 10 mL
  Filled 2017-09-02: qty 10

## 2017-09-02 MED ORDER — FOSAPREPITANT DIMEGLUMINE INJECTION 150 MG
Freq: Once | INTRAVENOUS | Status: AC
  Administered 2017-09-02: 10:00:00 via INTRAVENOUS
  Filled 2017-09-02: qty 5

## 2017-09-02 MED ORDER — FAMOTIDINE IN NACL 20-0.9 MG/50ML-% IV SOLN: 20.0000 mg | Freq: Once | INTRAVENOUS | Status: DC

## 2017-09-02 MED ORDER — HEPARIN SOD (PORK) LOCK FLUSH 100 UNIT/ML IV SOLN
500.0000 [IU] | Freq: Once | INTRAVENOUS | Status: AC | PRN
  Administered 2017-09-02: 500 [IU]
  Filled 2017-09-02: qty 5

## 2017-09-02 NOTE — Patient Instructions (Signed)
[facility] Discharge Instructions for Patients Receiving Chemotherapy  Today you received the following chemotherapy agents: Paclitaxel (Taxol) & Carboplatin  To help prevent nausea and vomiting after your treatment, we encourage you to take your nausea medication as prescribed.  PLEASE DO NOT TAKE ZOFRAN (ONDANSTERON) FOR FIRST THREE DAYS AFTER TREATMENT, TAKE COMPAZINE (PROCHLORPERAZINE) INSTEAD   If you develop nausea and vomiting that is not controlled by your nausea medication, call the clinic.   BELOW ARE SYMPTOMS THAT SHOULD BE REPORTED IMMEDIATELY:  *FEVER GREATER THAN 100.5 F  *CHILLS WITH OR WITHOUT FEVER  NAUSEA AND VOMITING THAT IS NOT CONTROLLED WITH YOUR NAUSEA MEDICATION  *UNUSUAL SHORTNESS OF BREATH  *UNUSUAL BRUISING OR BLEEDING  TENDERNESS IN MOUTH AND THROAT WITH OR WITHOUT PRESENCE OF ULCERS  *URINARY PROBLEMS  *BOWEL PROBLEMS  UNUSUAL RASH Items with * indicate a potential emergency and should be followed up as soon as possible.  Feel free to call the clinic should you have any questions or concerns. The clinic phone number is [phone].  Please show the [facility] at check-in to the Emergency Department and triage nurse.    Paclitaxel injection What is this medicine? PACLITAXEL (PAK li TAX el) is a chemotherapy drug. It targets fast dividing cells, like cancer cells, and causes these cells to die. This medicine is used to treat ovarian cancer, breast cancer, and other cancers. This medicine may be used for other purposes; ask your health care provider or pharmacist if you have questions. COMMON BRAND NAME(S): Onxol, Taxol What should I tell my health care provider before I take this medicine? They need to know if you have any of these conditions: -blood disorders -irregular heartbeat -infection (especially a virus infection such as chickenpox, cold sores, or herpes) -liver disease -previous or ongoing radiation  therapy -an unusual or allergic reaction to paclitaxel, alcohol, polyoxyethylated castor oil, other chemotherapy agents, other medicines, foods, dyes, or preservatives -pregnant or trying to get pregnant -breast-feeding How should I use this medicine? This drug is given as an infusion into a vein. It is administered in a hospital or clinic by a specially trained health care professional. Talk to your pediatrician regarding the use of this medicine in children. Special care may be needed. Overdosage: If you think you have taken too much of this medicine contact a poison control center or emergency room at once. NOTE: This medicine is only for you. Do not share this medicine with others. What if I miss a dose? It is important not to miss your dose. Call your doctor or health care professional if you are unable to keep an appointment. What may interact with this medicine? Do not take this medicine with any of the following medications: -disulfiram -metronidazole This medicine may also interact with the following medications: -cyclosporine -diazepam -ketoconazole -medicines to increase blood counts like filgrastim, pegfilgrastim, sargramostim -other chemotherapy drugs like cisplatin, doxorubicin, epirubicin, etoposide, teniposide, vincristine -quinidine -testosterone -vaccines -verapamil Talk to your doctor or health care professional before taking any of these medicines: -acetaminophen -aspirin -ibuprofen -ketoprofen -naproxen This list may not describe all possible interactions. Give your health care provider a list of all the medicines, herbs, non-prescription drugs, or dietary supplements you use. Also tell them if you smoke, drink alcohol, or use illegal drugs. Some items may interact with your medicine. What should I watch for while using this medicine? Your condition will be monitored carefully while you are receiving this medicine. You will need important blood work done  while  you are taking this medicine. This medicine can cause serious allergic reactions. To reduce your risk you will need to take other medicine(s) before treatment with this medicine. If you experience allergic reactions like skin rash, itching or hives, swelling of the face, lips, or tongue, tell your doctor or health care professional right away. In some cases, you may be given additional medicines to help with side effects. Follow all directions for their use. This drug may make you feel generally unwell. This is not uncommon, as chemotherapy can affect healthy cells as well as cancer cells. Report any side effects. Continue your course of treatment even though you feel ill unless your doctor tells you to stop. Call your doctor or health care professional for advice if you get a fever, chills or sore throat, or other symptoms of a cold or flu. Do not treat yourself. This drug decreases your body's ability to fight infections. Try to avoid being around people who are sick. This medicine may increase your risk to bruise or bleed. Call your doctor or health care professional if you notice any unusual bleeding. Be careful brushing and flossing your teeth or using a toothpick because you may get an infection or bleed more easily. If you have any dental work done, tell your dentist you are receiving this medicine. Avoid taking products that contain aspirin, acetaminophen, ibuprofen, naproxen, or ketoprofen unless instructed by your doctor. These medicines may hide a fever. Do not become pregnant while taking this medicine. Women should inform their doctor if they wish to become pregnant or think they might be pregnant. There is a potential for serious side effects to an unborn child. Talk to your health care professional or pharmacist for more information. Do not breast-feed an infant while taking this medicine. Men are advised not to father a child while receiving this medicine. This product may contain alcohol.  Ask your pharmacist or healthcare provider if this medicine contains alcohol. Be sure to tell all healthcare providers you are taking this medicine. Certain medicines, like metronidazole and disulfiram, can cause an unpleasant reaction when taken with alcohol. The reaction includes flushing, headache, nausea, vomiting, sweating, and increased thirst. The reaction can last from 30 minutes to several hours. What side effects may I notice from receiving this medicine? Side effects that you should report to your doctor or health care professional as soon as possible: -allergic reactions like skin rash, itching or hives, swelling of the face, lips, or tongue -low blood counts - This drug may decrease the number of white blood cells, red blood cells and platelets. You may be at increased risk for infections and bleeding. -signs of infection - fever or chills, cough, sore throat, pain or difficulty passing urine -signs of decreased platelets or bleeding - bruising, pinpoint red spots on the skin, black, tarry stools, nosebleeds -signs of decreased red blood cells - unusually weak or tired, fainting spells, lightheadedness -breathing problems -chest pain -high or low blood pressure -mouth sores -nausea and vomiting -pain, swelling, redness or irritation at the injection site -pain, tingling, numbness in the hands or feet -slow or irregular heartbeat -swelling of the ankle, feet, hands Side effects that usually do not require medical attention (report to your doctor or health care professional if they continue or are bothersome): -bone pain -complete hair loss including hair on your head, underarms, pubic hair, eyebrows, and eyelashes -changes in the color of fingernails -diarrhea -loosening of the fingernails -loss of appetite -muscle or joint pain -  red flush to skin -sweating This list may not describe all possible side effects. Call your doctor for medical advice about side effects. You may  report side effects to FDA at 1-800-FDA-1088. Where should I keep my medicine? This drug is given in a hospital or clinic and will not be stored at home. NOTE: This sheet is a summary. It may not cover all possible information. If you have questions about this medicine, talk to your doctor, pharmacist, or health care provider.  2018 Elsevier/Gold Standard (2015-04-18 19:58:00)    Carboplatin injection What is this medicine? CARBOPLATIN (KAR boe pla tin) is a chemotherapy drug. It targets fast dividing cells, like cancer cells, and causes these cells to die. This medicine is used to treat ovarian cancer and many other cancers. This medicine may be used for other purposes; ask your health care provider or pharmacist if you have questions. COMMON BRAND NAME(S): Paraplatin What should I tell my health care provider before I take this medicine? They need to know if you have any of these conditions: -blood disorders -hearing problems -kidney disease -recent or ongoing radiation therapy -an unusual or allergic reaction to carboplatin, cisplatin, other chemotherapy, other medicines, foods, dyes, or preservatives -pregnant or trying to get pregnant -breast-feeding How should I use this medicine? This drug is usually given as an infusion into a vein. It is administered in a hospital or clinic by a specially trained health care professional. Talk to your pediatrician regarding the use of this medicine in children. Special care may be needed. Overdosage: If you think you have taken too much of this medicine contact a poison control center or emergency room at once. NOTE: This medicine is only for you. Do not share this medicine with others. What if I miss a dose? It is important not to miss a dose. Call your doctor or health care professional if you are unable to keep an appointment. What may interact with this medicine? -medicines for seizures -medicines to increase blood counts like filgrastim,  pegfilgrastim, sargramostim -some antibiotics like amikacin, gentamicin, neomycin, streptomycin, tobramycin -vaccines Talk to your doctor or health care professional before taking any of these medicines: -acetaminophen -aspirin -ibuprofen -ketoprofen -naproxen This list may not describe all possible interactions. Give your health care provider a list of all the medicines, herbs, non-prescription drugs, or dietary supplements you use. Also tell them if you smoke, drink alcohol, or use illegal drugs. Some items may interact with your medicine. What should I watch for while using this medicine? Your condition will be monitored carefully while you are receiving this medicine. You will need important blood work done while you are taking this medicine. This drug may make you feel generally unwell. This is not uncommon, as chemotherapy can affect healthy cells as well as cancer cells. Report any side effects. Continue your course of treatment even though you feel ill unless your doctor tells you to stop. In some cases, you may be given additional medicines to help with side effects. Follow all directions for their use. Call your doctor or health care professional for advice if you get a fever, chills or sore throat, or other symptoms of a cold or flu. Do not treat yourself. This drug decreases your body's ability to fight infections. Try to avoid being around people who are sick. This medicine may increase your risk to bruise or bleed. Call your doctor or health care professional if you notice any unusual bleeding. Be careful brushing and flossing your teeth or using  a toothpick because you may get an infection or bleed more easily. If you have any dental work done, tell your dentist you are receiving this medicine. Avoid taking products that contain aspirin, acetaminophen, ibuprofen, naproxen, or ketoprofen unless instructed by your doctor. These medicines may hide a fever. Do not become pregnant while  taking this medicine. Women should inform their doctor if they wish to become pregnant or think they might be pregnant. There is a potential for serious side effects to an unborn child. Talk to your health care professional or pharmacist for more information. Do not breast-feed an infant while taking this medicine. What side effects may I notice from receiving this medicine? Side effects that you should report to your doctor or health care professional as soon as possible: -allergic reactions like skin rash, itching or hives, swelling of the face, lips, or tongue -signs of infection - fever or chills, cough, sore throat, pain or difficulty passing urine -signs of decreased platelets or bleeding - bruising, pinpoint red spots on the skin, black, tarry stools, nosebleeds -signs of decreased red blood cells - unusually weak or tired, fainting spells, lightheadedness -breathing problems -changes in hearing -changes in vision -chest pain -high blood pressure -low blood counts - This drug may decrease the number of white blood cells, red blood cells and platelets. You may be at increased risk for infections and bleeding. -nausea and vomiting -pain, swelling, redness or irritation at the injection site -pain, tingling, numbness in the hands or feet -problems with balance, talking, walking -trouble passing urine or change in the amount of urine Side effects that usually do not require medical attention (report to your doctor or health care professional if they continue or are bothersome): -hair loss -loss of appetite -metallic taste in the mouth or changes in taste This list may not describe all possible side effects. Call your doctor for medical advice about side effects. You may report side effects to FDA at 1-800-FDA-1088. Where should I keep my medicine? This drug is given in a hospital or clinic and will not be stored at home. NOTE: This sheet is a summary. It may not cover all possible  information. If you have questions about this medicine, talk to your doctor, pharmacist, or health care provider.  2018 Elsevier/Gold Standard (2007-09-22 14:38:05)

## 2017-09-02 NOTE — Progress Notes (Signed)
Received PA determination from [employer] for Ondansetron:  PA approved 06/29/17 - 06/30/18.  Received PA determination from [employer] twice for Lidocaine-Prilocaine cream:  First approval states 07/01/17 - 06/30/18.  Second states 06/29/17 - 12/03/17.

## 2017-09-02 NOTE — Progress Notes (Signed)
Alerted MD [place] about pt's blood pressures from final two titrations (93/52 & 97/48).  Pt asymptomatic, denies any problems.  Rest of VS stable.  Will continue with infusion and will continue to monitor.

## 2017-09-03 ENCOUNTER — Telehealth: Payer: Self-pay

## 2017-09-03 NOTE — Telephone Encounter (Signed)
Call to see how she is doing after first treatment. States that she is doing well with no problems. Instructed to call for questions or concerns.

## 2017-09-03 NOTE — Telephone Encounter (Signed)
-----   Message from Arman Bogus, RN sent at 09/02/2017  3:46 PM EST ----- Regarding: Alvy Bimler, 1st time taxol & carbo First time taxol & carbo, tolerated well, please call to follow up

## 2017-09-04 ENCOUNTER — Inpatient Hospital Stay: Payer: Medicare Other

## 2017-09-04 VITALS — BP 130/78 | HR 70 | Temp 98.0°F | Resp 18

## 2017-09-04 DIAGNOSIS — Z5189 Encounter for other specified aftercare: Secondary | ICD-10-CM | POA: Diagnosis not present

## 2017-09-04 DIAGNOSIS — Z5111 Encounter for antineoplastic chemotherapy: Secondary | ICD-10-CM | POA: Diagnosis not present

## 2017-09-04 DIAGNOSIS — E119 Type 2 diabetes mellitus without complications: Secondary | ICD-10-CM | POA: Diagnosis not present

## 2017-09-04 DIAGNOSIS — C541 Malignant neoplasm of endometrium: Secondary | ICD-10-CM

## 2017-09-04 MED ORDER — PEGFILGRASTIM-CBQV 6 MG/0.6ML ~~LOC~~ SOSY
6.0000 mg | PREFILLED_SYRINGE | Freq: Once | SUBCUTANEOUS | Status: AC
  Administered 2017-09-04: 6 mg via SUBCUTANEOUS

## 2017-09-04 MED ORDER — PEGFILGRASTIM-CBQV 6 MG/0.6ML ~~LOC~~ SOSY
PREFILLED_SYRINGE | SUBCUTANEOUS | Status: AC
  Filled 2017-09-04: qty 0.6

## 2017-09-04 NOTE — Patient Instructions (Signed)
Pegfilgrastim injection What is this medicine? PEGFILGRASTIM (PEG fil gra stim) is a long-acting granulocyte colony-stimulating factor that stimulates the growth of neutrophils, a type of white blood cell important in the body's fight against infection. It is used to reduce the incidence of fever and infection in patients with certain types of cancer who are receiving chemotherapy that affects the bone marrow, and to increase survival after being exposed to high doses of radiation. This medicine may be used for other purposes; ask your health care provider or pharmacist if you have questions. COMMON BRAND NAME(S): Neulasta What should I tell my health care provider before I take this medicine? They need to know if you have any of these conditions: -kidney disease -latex allergy -ongoing radiation therapy -sickle cell disease -skin reactions to acrylic adhesives (On-Body Injector only) -an unusual or allergic reaction to pegfilgrastim, filgrastim, other medicines, foods, dyes, or preservatives -pregnant or trying to get pregnant -breast-feeding How should I use this medicine? This medicine is for injection under the skin. If you get this medicine at home, you will be taught how to prepare and give the pre-filled syringe or how to use the On-body Injector. Refer to the patient Instructions for Use for detailed instructions. Use exactly as directed. Tell your healthcare provider immediately if you suspect that the On-body Injector may not have performed as intended or if you suspect the use of the On-body Injector resulted in a missed or partial dose. It is important that you put your used needles and syringes in a special sharps container. Do not put them in a trash can. If you do not have a sharps container, call your pharmacist or healthcare provider to get one. Talk to your pediatrician regarding the use of this medicine in children. While this drug may be prescribed for selected conditions,  precautions do apply. Overdosage: If you think you have taken too much of this medicine contact a poison control center or emergency room at once. NOTE: This medicine is only for you. Do not share this medicine with others. What if I miss a dose? It is important not to miss your dose. Call your doctor or health care professional if you miss your dose. If you miss a dose due to an On-body Injector failure or leakage, a new dose should be administered as soon as possible using a single prefilled syringe for manual use. What may interact with this medicine? Interactions have not been studied. Give your health care provider a list of all the medicines, herbs, non-prescription drugs, or dietary supplements you use. Also tell them if you smoke, drink alcohol, or use illegal drugs. Some items may interact with your medicine. This list may not describe all possible interactions. Give your health care provider a list of all the medicines, herbs, non-prescription drugs, or dietary supplements you use. Also tell them if you smoke, drink alcohol, or use illegal drugs. Some items may interact with your medicine. What should I watch for while using this medicine? You may need blood work done while you are taking this medicine. If you are going to need a MRI, CT scan, or other procedure, tell your doctor that you are using this medicine (On-Body Injector only). What side effects may I notice from receiving this medicine? Side effects that you should report to your doctor or health care professional as soon as possible: -allergic reactions like skin rash, itching or hives, swelling of the face, lips, or tongue -dizziness -fever -pain, redness, or irritation at site   where injected -pinpoint red spots on the skin -red or dark-brown urine -shortness of breath or breathing problems -stomach or side pain, or pain at the shoulder -swelling -tiredness -trouble passing urine or change in the amount of urine Side  effects that usually do not require medical attention (report to your doctor or health care professional if they continue or are bothersome): -bone pain -muscle pain This list may not describe all possible side effects. Call your doctor for medical advice about side effects. You may report side effects to FDA at 1-800-FDA-1088. Where should I keep my medicine? Keep out of the reach of children. Store pre-filled syringes in a refrigerator between 2 and 8 degrees C (36 and 46 degrees F). Do not freeze. Keep in carton to protect from light. Throw away this medicine if it is left out of the refrigerator for more than 48 hours. Throw away any unused medicine after the expiration date. NOTE: This sheet is a summary. It may not cover all possible information. If you have questions about this medicine, talk to your doctor, pharmacist, or health care provider.  2018 Elsevier/Gold Standard (2016-06-13 12:58:03)  

## 2017-09-05 NOTE — Progress Notes (Addendum)
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  Alexandria Irwin presented with symptoms of: postmenopausal bleeding since November 2017.   Biopsies revealed:   08/05/17 Diagnosis 1. Lymph node, sentinel, biopsy, right external iliac - NO CARCINOMA IDENTIFIED IN ONE LYMPH NODE (0/1) - SEE COMMENT 2. Lymph node, sentinel, biopsy, left external iliac #1 - NO CARCINOMA IDENTIFIED IN ONE LYMPH NODE (0/1) - SEE COMMENT 3. Lymph node, sentinel, biopsy, left external iliac #2 - NO CARCINOMA IDENTIFIED IN ONE LYMPH NODE (0/1) - SEE COMMENT 4. Uterus +/- tubes/ovaries, neoplastic, ovaries UTERUS: - ENDOMETRIOID CARCINOMA, FIGO GRADE 2, INVOLVING THE ENTIRE POSTERIOR MYOMETRIUM WITH TUMOR PRESENT AT INKED SEROSAL SURFACE - SEE ONCOLOGY TABLE BELOW CERVIX: - NABOTHIAN CYSTS - NO DYSPLASIA OR MALIGNANCY IDENTIFIED BILATERAL OVARIES: - NO CARCINOMA IDENTIFIED BILATERAL FALLOPIAN TUBES: - ENDOMETRIOID CARCINOMA INVOLVING LEFT FALLOPIAN TUBE  Past/Anticipated interventions by Gyn/Onc surgery, if any: 08/05/17 - Procedure:  SENTINEL NODE BIOPSY and ROBOTIC ASSISTED TOTAL HYSTERECTOMY WITH BILATERAL SALPINGO OOPHORECTOMY;  Surgeon: Everitt Amber, MD  Past/Anticipated interventions by medical oncology, if any: carboplatin & Taxol every 3 weeks x 6 cycles that started on 09/02/17.   Weight changes, if any: no  Bowel/Bladder complaints, if any: she reports having redness on her lower pelvis/upper vaginal area and is wondering if she has a yeast infection.  She is applying aquaphor to the area.  She reports having some constipation since surgery.  Nausea/Vomiting, if any: no  Pain issues, if any:  no  SAFETY ISSUES:  Prior radiation? no  Pacemaker/ICD? no  Possible current pregnancy? no  Is the patient on methotrexate? no  Current Complaints / other details:  Genetics appointment on 09/11/17.   Patient is here with her daughter.  She was given antifungal powder to use twice a day on the areas of redness in her abdominal fold and left groin.  BP 107/67 (BP Location: Right Arm, Patient Position: Sitting)   Pulse 67   Temp 98.5 F (36.9 C) (Oral)   Ht _0  (1.575 m)   Wt 166 lb (75.3 kg)   SpO2 96%   BMI 30.36 kg/m    Wt Readings from Last 3 Encounters:  09/08/17 166 lb (75.3 kg)  08/29/17 170 lb 12.8 oz (77.5 kg)  08/26/17 171 lb 12.8 oz (77.9 kg)

## 2017-09-08 ENCOUNTER — Ambulatory Visit
Admission: RE | Admit: 2017-09-08 | Discharge: 2017-09-08 | Disposition: A | Discharge status: Home / Self Care | Payer: Medicare Other | Source: Ambulatory Visit | Attending: Radiation Oncology | Admitting: Radiation Oncology

## 2017-09-08 ENCOUNTER — Other Ambulatory Visit: Payer: Self-pay

## 2017-09-08 ENCOUNTER — Encounter: Payer: Self-pay | Admitting: Radiation Oncology

## 2017-09-08 VITALS — BP 107/67 | HR 67 | Temp 98.5°F | Ht 62.0 in | Wt 166.0 lb

## 2017-09-08 DIAGNOSIS — Z9221 Personal history of antineoplastic chemotherapy: Secondary | ICD-10-CM | POA: Diagnosis not present

## 2017-09-08 DIAGNOSIS — Z9889 Other specified postprocedural states: Secondary | ICD-10-CM | POA: Insufficient documentation

## 2017-09-08 DIAGNOSIS — Z7984 Long term (current) use of oral hypoglycemic drugs: Secondary | ICD-10-CM | POA: Insufficient documentation

## 2017-09-08 DIAGNOSIS — Z86718 Personal history of other venous thrombosis and embolism: Secondary | ICD-10-CM | POA: Insufficient documentation

## 2017-09-08 DIAGNOSIS — R42 Dizziness and giddiness: Secondary | ICD-10-CM | POA: Insufficient documentation

## 2017-09-08 DIAGNOSIS — E039 Hypothyroidism, unspecified: Secondary | ICD-10-CM | POA: Insufficient documentation

## 2017-09-08 DIAGNOSIS — Z79899 Other long term (current) drug therapy: Secondary | ICD-10-CM | POA: Insufficient documentation

## 2017-09-08 DIAGNOSIS — Z888 Allergy status to other drugs, medicaments and biological substances status: Secondary | ICD-10-CM | POA: Insufficient documentation

## 2017-09-08 DIAGNOSIS — E559 Vitamin D deficiency, unspecified: Secondary | ICD-10-CM | POA: Insufficient documentation

## 2017-09-08 DIAGNOSIS — E119 Type 2 diabetes mellitus without complications: Secondary | ICD-10-CM | POA: Insufficient documentation

## 2017-09-08 DIAGNOSIS — Z882 Allergy status to sulfonamides status: Secondary | ICD-10-CM | POA: Insufficient documentation

## 2017-09-08 DIAGNOSIS — E785 Hyperlipidemia, unspecified: Secondary | ICD-10-CM | POA: Diagnosis not present

## 2017-09-08 DIAGNOSIS — E669 Obesity, unspecified: Secondary | ICD-10-CM

## 2017-09-08 DIAGNOSIS — Z7982 Long term (current) use of aspirin: Secondary | ICD-10-CM | POA: Diagnosis not present

## 2017-09-08 DIAGNOSIS — K219 Gastro-esophageal reflux disease without esophagitis: Secondary | ICD-10-CM | POA: Diagnosis not present

## 2017-09-08 DIAGNOSIS — Z808 Family history of malignant neoplasm of other organs or systems: Secondary | ICD-10-CM | POA: Diagnosis not present

## 2017-09-08 DIAGNOSIS — I10 Essential (primary) hypertension: Secondary | ICD-10-CM | POA: Insufficient documentation

## 2017-09-08 DIAGNOSIS — B372 Candidiasis of skin and nail: Secondary | ICD-10-CM | POA: Diagnosis not present

## 2017-09-08 DIAGNOSIS — Z90710 Acquired absence of both cervix and uterus: Secondary | ICD-10-CM | POA: Insufficient documentation

## 2017-09-08 DIAGNOSIS — C541 Malignant neoplasm of endometrium: Secondary | ICD-10-CM | POA: Diagnosis not present

## 2017-09-08 MED ORDER — MECLIZINE HCL 32 MG PO TABS: 32.0000 mg | ORAL_TABLET | Freq: Three times a day (TID) | ORAL | 0 refills | Status: DC | PRN

## 2017-09-08 NOTE — Progress Notes (Signed)
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 female who presents today out of the courtesy of Dr. Everitt Amber. She presents with a diagnosis of  adenocarcinoma of the endometrium. The patient reported postmenopausal bleeding since November 2017. She presented to her PCP at that time and was diagnosed with a urinary tract infection and treated with antibiotics. Given the persistence of her symptoms, she presented to her gynecologist, Dr. Deatra Ina. Ultrasound performed on 10/17/16 showed the patient had a 2.5 cm uterine mass and the endometrium measured 3.4 mm. Pap smear and pelvic examination were unremarkable at this time. The source of bleeding was not thought to be gynecologic at this time.   The patient was referred to Dr. Noah Delaine from [facility]rology. The patient underwent CT of the abdomen and pelvis on 03/13/17. This revealed no definite source of hematuria. Heterogeneously enhancing lesion in the posterior aspect of the uterine body likely representing a fibroid noted. She was referred back to Dr. Deatra Ina on 04/30/17. Ultrasound at this time revealed a 4 cm uterine mass which may be submucous. Endometrium measured 7.8 mm. Given the persistence of symptoms, the patient was scheduled to undergo a hysterectomy D&C and polypectomy. Endometrial curettage showed endometrioid adenocarcinoma, FIGO grade 1.  The patient proceeded to undergo sentinel lymph node biopsy with robotic assisted total hysterectomy with bilateral salpingo oophorectomy on 08/05/17. This showed no carcinoma in three sampled lymph nodes (0/3).  Pathology of the uterus shows endometrioid carcinoma, grade 2, involving the entire posterior myometrium with tumor noted at inked serosal surface. Pathology of the cervix shows nabothian cysts without evidence of malignancy. Pathology of the bilateral ovaries shows no carcinoma. Pathology of the bilateral fallopian tubes shows endometrioid carcinoma in the left fallopian tube. CT of the chest, abdomen, and pelvis on 08/14/17 showed interval hysterectomy with no evidence of recurrence or metastatic disease. Small volume pelvic fluid and a presumed postoperative seroma or lymphangioma noted within the left hemipelvis. The patient started carboplatin/ taxol on 09/02/17 with Dr. Alvy Bimler. Her next cycle will be 09/23/17.  She presents with her daughter today to discuss the role of radiation therapy as part of her disease management.  The patient currently complains of redness on her lower pelvis skin fold . She is concerned of a yeast infection.  She reports occasional joint pains with chemotherapy. She reports numbness in the right thigh immediately following surgery that has now resolved. She denies hematuria, vaginal or rectal bleeding since the surgery. She reports constipation since surgery. She reports occasional dizziness. She denies weight changes, nausea/vomiting, or pain at this time.   PREVIOUS RADIATION THERAPY: No  PAST MEDICAL HISTORY:  has a past medical history of Cataract, Endometrial adenocarcinoma ([place]), Environmental and seasonal allergies, GERD (gastroesophageal reflux disease), History of DVT of lower extremity, Hyperlipidemia, Hypertension, Hypothyroidism, Type 2 diabetes mellitus ([place]), Umbilical hernia, Vitamin B 12 deficiency, Vitamin D deficiency, and Wears glasses.    PAST SURGICAL HISTORY: Past Surgical History:  Procedure Laterality Date  . BREAST BIOPSY Left 2009   benign  . CATARACT EXTRACTION W/ INTRAOCULAR LENS IMPLANT Left 2018  . CATARACT EXTRACTION W/PHACO  Right  03/14/2014   Procedure: CATARACT EXTRACTION PHACO AND INTRAOCULAR LENS PLACEMENT (IOC);  Surgeon: Tonny Branch, MD;  Location: [facility];  Service: Ophthalmology;  Laterality: Right;  CDE: 8.34  . FOOT GANGLION EXCISION Right 2013  . HYSTEROSCOPY W/D&C N/A 06/13/2017   Procedure: DILATATION AND CURETTAGE /HYSTEROSCOPY;  Surgeon: Alden Hipp, MD;  Location: [facility];  Service: Gynecology;  Laterality: N/A;  . IR FLUORO GUIDE PORT INSERTION RIGHT  08/27/2017  . IR US GUIDE VASC ACCESS RIGHT  08/27/2017  . LAPAROSCOPIC CHOLECYSTECTOMY  2005  . ROBOTIC ASSISTED TOTAL HYSTERECTOMY WITH BILATERAL SALPINGO OOPHERECTOMY Bilateral 08/05/2017   Procedure: ROBOTIC ASSISTED TOTAL HYSTERECTOMY WITH BILATERAL SALPINGO OOPHORECTOMY;  Surgeon: Everitt Amber, MD;  Location: [facility];  Service: Gynecology;  Laterality: Bilateral;  . SENTINEL NODE BIOPSY Bilateral 08/05/2017   Procedure: SENTINEL NODE BIOPSY;  Surgeon: Everitt Amber, MD;  Location: [facility];  Service: Gynecology;  Laterality: Bilateral;  . UMBILICAL HERNIA REPAIR N/A 08/05/2017   Procedure: HERNIA REPAIR UMBILICAL ADULT;  Surgeon: Everitt Amber, MD;  Location: [facility];  Service: Gynecology;  Laterality: N/A;  . VEIN SURGERY Right 2011   vein stripping-leg  and laser treatment    FAMILY HISTORY: family history includes Cancer in her mother; Pancreatic cancer in her mother.  SOCIAL HISTORY:  reports that  has never smoked. she has never used smokeless tobacco. She reports that she does not drink alcohol or use drugs. Patient lives with her husband and daughter.   ALLERGIES: Ace inhibitors; Norvasc [amlodipine]; Latex; Neosporin [neomycin-bacitracin zn-polymyx]; and Sulfa antibiotics  MEDICATIONS:  Current Outpatient Medications  Medication Sig Dispense Refill  . acetaminophen (TYLENOL) 500 MG tablet Take 1,000 mg by mouth every 8 (eight) hours as needed for mild pain or headache.    Marland Kitchen aspirin EC 81 MG tablet Take 81 mg by mouth daily.    . Calcium Citrate-Vitamin  D ([employer] CALCIUM CITRATE +D PO) Take 2 tablets by mouth every evening.     . Cholecalciferol (D 2000) 2000 units TABS Take 2,000 Units by mouth daily.    . cloNIDine (CATAPRES) 0.1 MG tablet TAKE 1 TABLET BY MOUTH TWICE DAILY 180 tablet 1  . cyanocobalamin (,VITAMIN B-12,) 1000 MCG/ML injection Inject 1,000 mcg into the muscle every Thursday.    . cycloSPORINE (RESTASIS) 0.05 % ophthalmic emulsion Place 1 drop into both eyes 2 (two) times daily.     Marland Kitchen dexamethasone (DECADRON) 4 MG tablet Take 3 tabs at night and 3 tabs at 6 am the morning of chemotherapy, every 3 weeks with food 36 tablet 0  . Famotidine (PEPCID AC PO) Take by mouth every morning.    . furosemide (LASIX) 40 MG tablet TAKE 1 TABLET BY MOUTH ONCE DAILY 90 tablet 0  . JANUVIA 50 MG tablet TAKE 1 TABLET BY MOUTH ONCE DAILY 90 tablet 0  . levothyroxine (SYNTHROID, LEVOTHROID) 100 MCG tablet TAKE 1 TABLET BY MOUTH ONCE DAILY (Patient taking differently: TAKE 1 TABLET BY MOUTH ONCE DAILY--- takes in am) 90 tablet 2  . lidocaine-prilocaine (EMLA) cream Apply to affected area once 30 g 3  . losartan (COZAAR) 100 MG tablet TAKE 1 TABLET BY MOUTH ONCE DAILY 90 tablet 0  . Magnesium 250 MG TABS Take 250 mg by mouth every evening.     . meclizine (ANTIVERT) 50 MG tablet Take 1 tablet (50 mg total) by mouth 2 (two) times daily as needed. 30 tablet 0  . metFORMIN (GLUCOPHAGE) 500 MG tablet TAKE 2 TABLETS BY MOUTH  ONCE DAILY IN THE MORNING AND TAKE 1 TABLET ONCE DAILY IN THE EVENING (Patient taking differently: Take 500-1,000 mg by mouth 2 (two) times daily with a meal. TAKE 2 TABLETS BY MOUTH ONCE DAILY IN THE MORNING AND TAKE 1 TABLET ONCE DAILY IN THE EVENING) 270 tablet 1  . metoprolol succinate (TOPROL-XL) 50 MG 24 hr tablet TAKE ONE TABLET BY MOUTH TWICE DAILY WITH  OR  IMMEDIATELY  FOLLOWING  A  MEAL 180 tablet 4  . Omega-3 Fatty Acids (FISH OIL) 1000 MG CAPS Take 1,000 mg by mouth every evening.    . ondansetron (ZOFRAN) 8 MG tablet Take 1  tablet (8 mg total) by mouth 2 (two) times daily as needed for refractory nausea / vomiting. Start on day 3 after chemo. 30 tablet 1  . ONETOUCH DELICA LANCETS 88T MISC USE ONE  TO CHECK GLUCOSE ONCE DAILY 100 each 1  . ONETOUCH VERIO test strip USE TO CHECK GLUCOSE DAILY 100 each 3  . prochlorperazine (COMPAZINE) 10 MG tablet Take 1 tablet (10 mg total) by mouth every 6 (six) hours as needed (Nausea or vomiting). 30 tablet 1  . simvastatin (ZOCOR) 40 MG tablet TAKE 1 TABLET BY MOUTH ONCE DAILY AT 6PM 90 tablet 1  . vitamin C (ASCORBIC ACID) 500 MG tablet Take 500 mg by mouth daily.    Marland Kitchen albuterol (PROVENTIL HFA;VENTOLIN HFA) 108 (90 BASE) MCG/ACT inhaler Inhale 2 puffs into the lungs every 6 (six) hours as needed for wheezing or shortness of breath. (Patient not taking: Reported on 09/08/2017) 1 Inhaler 1   Current Facility-Administered Medications  Medication Dose Route Frequency Provider Last Rate Last Dose  . cyanocobalamin ((VITAMIN B-12)) injection 1,000 mcg  1,000 mcg Intramuscular Daily Evelina Dun A, FNP   1,000 mcg at 08/26/17 2549    REVIEW OF SYSTEMS: A 10+ POINT REVIEW OF SYSTEMS WAS OBTAINED including neurology, dermatology, psychiatry, cardiac, respiratory, lymph, extremities, GI, GU, musculoskeletal, constitutional, reproductive, HEENT. All pertinent positives are noted in the HPI. All others are negative.    PHYSICAL EXAM:  height is '5\' 2"'  (1.575 m) and weight is 166 lb (75.3 kg). Her oral temperature is 98.5 F (36.9 C). Her blood pressure is 107/67 and her pulse is 67. Her oxygen saturation is 96%.  General: Alert and oriented, in no acute distress HEENT: Head is normocephalic. Extraocular movements are intact. Oropharynx is clear. Neck: Neck is supple, no palpable cervical or supraclavicular lymphadenopathy. Heart: Regular in rate and rhythm with no murmurs, rubs, or gallops. Chest: Clear to auscultation bilaterally, with no rhonchi, wheezes, or rales. Abdomen: Soft,  nontender, nondistended, with no rigidity or guarding. Small scars from laparoscopic procedure that have healed well without signs of drainage or infection. The patient has some erythema in the skin folds of the lower abdomen and left inguinal area most consistent with a yeast infection. Extremities: No cyanosis or edema. Lymphatics: see Neck Exam Skin: No concerning lesions. Musculoskeletal: symmetric strength and muscle tone throughout. Neurologic: Cranial nerves II through XII are grossly intact. No obvious focalities. Speech is fluent. Coordination is intact. Psychiatric: Judgment and insight are intact. Affect is appropriate. Pelvic exam deferred until simulation and planning day.    ECOG = 1  0 - Asymptomatic (Fully active, able to carry on all predisease activities without restriction)  1 - Symptomatic but completely ambulatory (Restricted in physically strenuous activity but ambulatory and able to carry out work of a light or sedentary nature. For example, light housework, office work)  2 - Symptomatic, <50% in bed during the day (Ambulatory and capable of all self care but unable to carry out any work activities. Up and about more than 50% of waking hours)  3 - Symptomatic, >50% in bed, but not bedbound (Capable of only limited self-care, confined to bed or chair 50% or more of waking hours)  4 - Bedbound (Completely disabled. Cannot carry on any self-care. Totally confined to bed or chair)  5 - Death   Eustace Pen MM, Creech RH, Tormey DC, et al. [phone]). "Toxicity and response criteria of the [facility]". [place] Oncol. 5 (6): 649-55  LABORATORY DATA:  Lab Results  Component Value Date   WBC 7.9 08/27/2017   HGB 13.1 08/27/2017   HCT 40.0 08/27/2017   MCV 93.9 08/27/2017   PLT 261 08/27/2017   NEUTROABS 4.8 08/27/2017   Lab Results  Component Value Date   NA 142 08/26/2017   K 4.6 08/26/2017   CL 101 08/26/2017   CO2 25 08/26/2017   GLUCOSE  150 (H) 08/26/2017   CREATININE 0.86 08/26/2017   CALCIUM 9.7 08/26/2017      RADIOGRAPHY: Ct Chest W Contrast  Result Date: 08/14/2017 CLINICAL DATA:  Staging of endometrial cancer. EXAM: CT CHEST, ABDOMEN, AND PELVIS WITH CONTRAST TECHNIQUE: Multidetector CT imaging of the chest, abdomen and pelvis was performed following the standard protocol during bolus administration of intravenous contrast. CONTRAST:  159m ISOVUE-300 IOPAMIDOL (ISOVUE-300) INJECTION 61% COMPARISON:  03/13/2017 abdominopelvic CT. Chest radiograph 09/27/2014. FINDINGS: CT CHEST FINDINGS Cardiovascular: Aortic and branch vessel atherosclerosis. Tortuous thoracic aorta. Normal heart size, without pericardial effusion. Right coronary artery atherosclerosis. No central pulmonary embolism, on this non-dedicated study. Mediastinum/Nodes: No supraclavicular adenopathy. No mediastinal or hilar adenopathy. Lungs/Pleura: No pleural fluid.  Clear lungs. Musculoskeletal: No acute osseous abnormality. CT ABDOMEN PELVIS FINDINGS Hepatobiliary: Subtle irregular hepatic capsule, including on image 57/series 2. No focal liver lesion. Cholecystectomy, without biliary ductal dilatation. Pancreas: Normal, without mass or ductal dilatation. Spleen: Normal in size, without focal abnormality. Adrenals/Urinary Tract: Normal kidneys, without hydronephrosis. Normal urinary bladder. Stomach/Bowenormal stomach, without wall thickening. Normal colon and terminal ileum. Normal small bowel. Vascular/Lymphatic: Aortic and branch vessel atherosclerosis. No abdominopelvic adenopathy. Reproductive: Hysterectomy. Fluid in the left hemipelvis, likely adnexal, measures 4.4 x 3.8 cm on image 95/series 2. Other: Minimal free fluid in the pelvis. Anterior pelvic wall edema is likely postoperative. No well-defined fluid collection. Musculoskeletal: Degenerate disc disease at L4-5 and L5-S1. IMPRESSION: 1. Interval hysterectomy. No findings of recurrent or metastatic disease.  2. Small volume pelvic fluid and a presumed postoperative seroma or lymphangioma within the left hemipelvis. 3. Subtle irregular hepatic capsule is suspicious for mild cirrhosis. Correlate with risk factors. 4. Coronary artery atherosclerosis. Aortic Atherosclerosis (ICD10-I70.0). Electronically Signed   By: KAbigail MiyamotoM.D.   On: 08/14/2017 09:23   Ct Abdomen Pelvis W Contrast  Result Date: 08/14/2017 CLINICAL DATA:  Staging of endometrial cancer. EXAM: CT CHEST, ABDOMEN, AND PELVIS WITH CONTRAST TECHNIQUE: Multidetector CT imaging of the chest, abdomen and pelvis was performed following the standard protocol during bolus administration of intravenous contrast. CONTRAST:  1022mISOVUE-300 IOPAMIDOL (ISOVUE-300) INJECTION 61% COMPARISON:  03/13/2017 abdominopelvic CT. Chest radiograph 09/27/2014. FINDINGS: CT CHEST FINDINGS Cardiovascular: Aortic and branch vessel atherosclerosis. Tortuous thoracic aorta. Normal heart size, without pericardial effusion. Right coronary artery atherosclerosis. No central pulmonary embolism, on this non-dedicated study. Mediastinum/Nodes: No supraclavicular adenopathy. No mediastinal or hilar adenopathy. Lungs/Pleura: No pleural fluid.  Clear lungs. Musculoskeletal: No  acute osseous abnormality. CT ABDOMEN PELVIS FINDINGS Hepatobiliary: Subtle irregular hepatic capsule, including on image 57/series 2. No focal liver lesion. Cholecystectomy, without biliary ductal dilatation. Pancreas: Normal, without mass or ductal dilatation. Spleen: Normal in size, without focal abnormality. Adrenals/Urinary Tract: Normal kidneys, without hydronephrosis. Normal urinary bladder. Stomach/Bowenormal stomach, without wall thickening. Normal colon and terminal ileum. Normal small bowel. Vascular/Lymphatic: Aortic and branch vessel atherosclerosis. No abdominopelvic adenopathy. Reproductive: Hysterectomy. Fluid in the left hemipelvis, likely adnexal, measures 4.4 x 3.8 cm on image 95/series 2. Other:  Minimal free fluid in the pelvis. Anterior pelvic wall edema is likely postoperative. No well-defined fluid collection. Musculoskeletal: Degenerate disc disease at L4-5 and L5-S1. IMPRESSION: 1. Interval hysterectomy. No findings of recurrent or metastatic disease. 2. Small volume pelvic fluid and a presumed postoperative seroma or lymphangioma within the left hemipelvis. 3. Subtle irregular hepatic capsule is suspicious for mild cirrhosis. Correlate with risk factors. 4. Coronary artery atherosclerosis. Aortic Atherosclerosis (ICD10-I70.0). Electronically Signed   By: Abigail Miyamoto M.D.   On: 08/14/2017 09:23   Ir US Guide Vasc Access Right  Result Date: 08/27/2017 INDICATION: History of metastatic endometrial cancer in need of durable intravenous access for chemotherapy administration. EXAM: IMPLANTED PORT A CATH PLACEMENT WITH ULTRASOUND AND FLUOROSCOPIC GUIDANCE COMPARISON:  Chest CT - 08/14/2017 MEDICATIONS: Ancef 2 gm IV; The antibiotic was administered within an appropriate time interval prior to skin puncture. ANESTHESIA/SEDATION: Moderate (conscious) sedation was employed during this procedure. A total of Versed 1 mg and Fentanyl 50 mcg was administered intravenously. Moderate Sedation Time: 36 minutes. The patient's level of consciousness and vital signs were monitored continuously by radiology nursing throughout the procedure under my direct supervision. CONTRAST:  None FLUOROSCOPY TIME:  2 minutes, 36 seconds (3 mGy) COMPLICATIONS: None immediate. PROCEDURE: The procedure, risks, benefits, and alternatives were explained to the patient. Questions regarding the procedure were encouraged and answered. The patient understands and consents to the procedure. The right neck and chest were prepped with chlorhexidine in a sterile fashion, and a sterile drape was applied covering the operative field. Maximum barrier sterile technique with sterile gowns and gloves were used for the procedure. A timeout was  performed prior to the initiation of the procedure. Local anesthesia was provided with 1% lidocaine with epinephrine. After creating a small venotomy incision, a micropuncture kit was utilized to access the internal jugular vein. Real-time ultrasound guidance was utilized for vascular access including the acquisition of a permanent ultrasound image documenting patency of the accessed vessel. The microwire was utilized to measure appropriate catheter length. A subcutaneous port pocket was then created along the upper chest wall utilizing a combination of sharp and blunt dissection. The pocket was irrigated with sterile saline. A single lumen ISP power injectable port was chosen for placement. The 8 Fr catheter was tunneled from the port pocket site to the venotomy incision. The port was placed in the pocket. The external catheter was trimmed to appropriate length. At the venotomy, an 8 Fr peel-away sheath was placed over a guidewire under fluoroscopic guidance. The catheter was then placed through the sheath and the sheath was removed. Final catheter positioning was confirmed and documented with a fluoroscopic spot radiograph. The port was accessed with a Huber needle, aspirated and flushed with heparinized saline. The venotomy site was closed with an interrupted 4-0 Vicryl suture. The port pocket incision was closed with interrupted 2-0 Vicryl suture and the skin was opposed with a running subcuticular 4-0 Vicryl suture. Dermabond and Steri-strips were applied to  both incisions. Dressings were placed. The patient tolerated the procedure well without immediate post procedural complication. FINDINGS: After catheter placement, the tip lies within the superior cavoatrial junction. The catheter aspirates and flushes normally and is ready for immediate use. IMPRESSION: Successful placement of a right internal jugular approach power injectable Port-A-Cath. The catheter is ready for immediate use. Electronically Signed    By: Sandi Mariscal M.D.   On: 08/27/2017 13:02   Ir Fluoro Guide Port Insertion Right  Result Date: 08/27/2017 INDICATION: History of metastatic endometrial cancer in need of durable intravenous access for chemotherapy administration. EXAM: IMPLANTED PORT A CATH PLACEMENT WITH ULTRASOUND AND FLUOROSCOPIC GUIDANCE COMPARISON:  Chest CT - 08/14/2017 MEDICATIONS: Ancef 2 gm IV; The antibiotic was administered within an appropriate time interval prior to skin puncture. ANESTHESIA/SEDATION: Moderate (conscious) sedation was employed during this procedure. A total of Versed 1 mg and Fentanyl 50 mcg was administered intravenously. Moderate Sedation Time: 36 minutes. The patient's level of consciousness and vital signs were monitored continuously by radiology nursing throughout the procedure under my direct supervision. CONTRAST:  None FLUOROSCOPY TIME:  2 minutes, 36 seconds (3 mGy) COMPLICATIONS: None immediate. PROCEDURE: The procedure, risks, benefits, and alternatives were explained to the patient. Questions regarding the procedure were encouraged and answered. The patient understands and consents to the procedure. The right neck and chest were prepped with chlorhexidine in a sterile fashion, and a sterile drape was applied covering the operative field. Maximum barrier sterile technique with sterile gowns and gloves were used for the procedure. A timeout was performed prior to the initiation of the procedure. Local anesthesia was provided with 1% lidocaine with epinephrine. After creating a small venotomy incision, a micropuncture kit was utilized to access the internal jugular vein. Real-time ultrasound guidance was utilized for vascular access including the acquisition of a permanent ultrasound image documenting patency of the accessed vessel. The microwire was utilized to measure appropriate catheter length. A subcutaneous port pocket was then created along the upper chest wall utilizing a combination of sharp and  blunt dissection. The pocket was irrigated with sterile saline. A single lumen ISP power injectable port was chosen for placement. The 8 Fr catheter was tunneled from the port pocket site to the venotomy incision. The port was placed in the pocket. The external catheter was trimmed to appropriate length. At the venotomy, an 8 Fr peel-away sheath was placed over a guidewire under fluoroscopic guidance. The catheter was then placed through the sheath and the sheath was removed. Final catheter positioning was confirmed and documented with a fluoroscopic spot radiograph. The port was accessed with a Huber needle, aspirated and flushed with heparinized saline. The venotomy site was closed with an interrupted 4-0 Vicryl suture. The port pocket incision was closed with interrupted 2-0 Vicryl suture and the skin was opposed with a running subcuticular 4-0 Vicryl suture. Dermabond and Steri-strips were applied to both incisions. Dressings were placed. The patient tolerated the procedure well without immediate post procedural complication. FINDINGS: After catheter placement, the tip lies within the superior cavoatrial junction. The catheter aspirates and flushes normally and is ready for immediate use. IMPRESSION: Successful placement of a right internal jugular approach power injectable Port-A-Cath. The catheter is ready for immediate use. Electronically Signed   By: Sandi Mariscal M.D.   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 Given the patient's advanced stage and deeply invasive tumor she  would be at risk for vaginal cuff recurrence. I agree with Dr. Serita Grit recommendation for vaginal brachytherapy. We discussed the course of treatment, side effects, and long term toxicities of radiotherapy. The patient appears to understand and wishes to proceed. A consent form was signed and a copy was provided for the patient.  I  anticipate 5 high dose rate treatments directed at the upper vaginal vault. Treatment can begin during the patient's chemotherapy.  She appears to be tolerating her chemotherapy well. We will try to initiate her brachytherapy treatments next week if possible.   The patient is having dizziness, which she has in the past. I refilled her Meclozine prescription.  The patient was given Nystatin antifungal powder for cutaneous candidiasis.  PLAN:  Anticipate treatment to begin next week with 5 high dose rate treatments anticipated. Patient is scheduled for genetic testing on 09/11/17.       ------------------------------------------------  Blair Promise, PhD, MD  This document serves as a record of services personally performed by Gery Pray, MD. It was created on his behalf by Bethann Humble, a trained medical scribe. The creation of this record is based on the scribe's personal observations and the provider's statements to them. This document has been checked and approved by the attending provider.

## 2017-09-09 ENCOUNTER — Telehealth: Payer: Self-pay | Admitting: Oncology

## 2017-09-09 ENCOUNTER — Telehealth: Payer: Self-pay | Admitting: *Deleted

## 2017-09-09 NOTE — Telephone Encounter (Signed)
Left a message for [place] verifying that Antivert 25 mg tablets can be given in the place of 32 mg tablets on the original prescription per Dr. Sondra Come.

## 2017-09-09 NOTE — Telephone Encounter (Signed)
CALLED PATIENT TO INFORM OF NEW HDR [facility], SPOKE WITH PATIENT AND SHE IS AWARE OF THESE APPTS. °

## 2017-09-11 ENCOUNTER — Inpatient Hospital Stay (HOSPITAL_BASED_OUTPATIENT_CLINIC_OR_DEPARTMENT_OTHER): Payer: Medicare Other | Admitting: Genetic Counselor

## 2017-09-11 ENCOUNTER — Inpatient Hospital Stay: Payer: Medicare Other

## 2017-09-11 ENCOUNTER — Encounter: Payer: Self-pay | Admitting: Genetic Counselor

## 2017-09-11 DIAGNOSIS — C541 Malignant neoplasm of endometrium: Secondary | ICD-10-CM | POA: Diagnosis not present

## 2017-09-11 DIAGNOSIS — Z8041 Family history of malignant neoplasm of ovary: Secondary | ICD-10-CM | POA: Insufficient documentation

## 2017-09-11 DIAGNOSIS — Z800 Family history of malignant neoplasm of digestive organs: Secondary | ICD-10-CM | POA: Diagnosis not present

## 2017-09-11 DIAGNOSIS — E119 Type 2 diabetes mellitus without complications: Secondary | ICD-10-CM | POA: Diagnosis not present

## 2017-09-11 DIAGNOSIS — Z809 Family history of malignant neoplasm, unspecified: Secondary | ICD-10-CM | POA: Diagnosis not present

## 2017-09-11 DIAGNOSIS — Z5189 Encounter for other specified aftercare: Secondary | ICD-10-CM | POA: Diagnosis not present

## 2017-09-11 DIAGNOSIS — Z5111 Encounter for antineoplastic chemotherapy: Secondary | ICD-10-CM | POA: Diagnosis not present

## 2017-09-11 LAB — CMP (CANCER CENTER ONLY)
ALBUMIN: 3.6 g/dL (ref 3.5–5.0)
ALT: 23 U/L (ref 0–55)
ANION GAP: 9 (ref 3–11)
AST: 22 U/L (ref 5–34)
Alkaline Phosphatase: 59 U/L (ref 40–150)
BUN: 12 mg/dL (ref 7–26)
CHLORIDE: 101 mmol/L (ref 98–109)
CO2: 30 mmol/L — AB (ref 22–29)
Calcium: 11 mg/dL — ABNORMAL HIGH (ref 8.4–10.4)
Creatinine: 1.02 mg/dL (ref 0.60–1.10)
GFR, Estimated: 53 mL/min — ABNORMAL LOW (ref >60)
GLUCOSE: 168 mg/dL — AB (ref 70–140)
POTASSIUM: 4.8 mmol/L (ref 3.5–5.1)
SODIUM: 140 mmol/L (ref 136–145)
Total Bilirubin: 0.3 mg/dL (ref 0.2–1.2)
Total Protein: 6.6 g/dL (ref 6.4–8.3)

## 2017-09-11 LAB — CBC WITH DIFFERENTIAL (CANCER CENTER ONLY)
BASOS PCT: 1 %
Basophils Absolute: 0.2 10*3/uL — ABNORMAL HIGH (ref 0.0–0.1)
Eosinophils Absolute: 0.2 10*3/uL (ref 0.0–0.5)
Eosinophils Relative: 2 %
HEMATOCRIT: 36 % (ref 34.8–46.6)
HEMOGLOBIN: 12 g/dL (ref 11.6–15.9)
LYMPHS PCT: 20 %
Lymphs Abs: 2.7 10*3/uL (ref 0.9–3.3)
MCH: 30.1 pg (ref 25.1–34.0)
MCHC: 33.3 g/dL (ref 31.5–36.0)
MCV: 90.5 fL (ref 79.5–101.0)
MONO ABS: 0.6 10*3/uL (ref 0.1–0.9)
MONOS PCT: 5 %
NEUTROS ABS: 9.9 10*3/uL — AB (ref 1.5–6.5)
Neutrophils Relative %: 72 %
Platelet Count: 171 10*3/uL (ref 145–400)
RBC: 3.97 MIL/uL (ref 3.70–5.45)
RDW: 13.7 % (ref 11.2–14.5)
WBC Count: 13.7 10*3/uL — ABNORMAL HIGH (ref 3.9–10.3)

## 2017-09-11 NOTE — Progress Notes (Signed)
[facility]      Initial Visit   Patient Name: Alexandria Irwin Patient DOB: Apr 08, 1946 Patient Age: 72 y.o. Encounter Date: 09/11/2017  Referring Provider: Heath Lark, MD  Primary Care Provider: Sharion Balloon, FNP  Reason for Visit: Evaluate for hereditary susceptibility to cancer    Assessment and Plan:  . Ms. Battie's personal history of uterine cancer at age 39 is not suggestive of a hereditary predisposition to cancer, but given the combination of having loss of the hMLH1 protein with her mother's history of ovarian and pancreatic cancer means a genetics evaluation is indicated. 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  . Testing is recommended to determine whether she has a pathogenic mutation that will impact her screening and risk-reduction for cancer. A negative result will be reassuring.  . Ms. Icard wished to pursue genetic testing and a blood sample will be sent for analysis of the 83 genes on [facility]'s Multi-Cancer panel (ALK, APC, ATM, AXIN2, BAP1, BARD1, BLM, BMPR1A, BRCA1, BRCA2, BRIP1, CASR, CDC73, CDH1, CDK4, CDKN1B, CDKN1C, CDKN2A, CEBPA, CHEK2, CTNNA1, DICER1, DIS3L2, EGFR, EPCAM, FH, FLCN, GATA2, GPC3, GREM1, HOXB13, HRAS, KIT, MAX, MEN1, MET, MITF, MLH1, MSH2, MSH3, MSH6, MUTYH, NBN, NF1, NF2, NTHL1, PALB2, PDGFRA, PHOX2B, PMS2, POLD1, POLE, POT1, PRKAR1A, PTCH1, PTEN, RAD50, RAD51C, RAD51D, RB1, RECQL4, RET, RUNX1, SDHA, SDHAF2, SDHB, SDHC, SDHD, SMAD4, SMARCA4, SMARCB1, SMARCE1, STK11, SUFU, TERC, TERT, TMEM127, TP53, TSC1, TSC2, VHL, WRN, WT1).   . Results should be available in approximately 2-4 weeks, at which point we will contact her and address implications for her as well as address genetic testing for at-risk family members, if needed.     Dr. Alvy Bimler was available for questions concerning this case. Total time spent by me in face-to-face counseling was approximately 30 minutes.    _____________________________________________________________________   History of Present Illness: Ms. Alexandria Irwin, a 72 y.o. female, is being seen at the [facility] due to a personal and family history of cancer. She presents to clinic today with her son to discuss the possibility of a hereditary predisposition to cancer and discuss whether genetic testing is warranted.  Ms. Ferrie was diagnosed with uterine cancer at the age of 56.  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 Hypermethylation testing has not been done.  Oncology History   endometroid MSI by [facility] showed loss of nuclear expression, BRAF neg     Endometrial adenocarcinoma ([place])   06/13/2017 Procedure    PROCEDURE: Hysteroscopy, D&C  SURGEON: Richard D. Deatra Ina M.D.  FINDINGS: Hypertrophic endometrial tissue, no polyps, no myoma         06/13/2017 Pathology Results    Endometrium, curettage ENDOMETRIOID ADENOCARCINOMA, FIGO GRADE 1      08/05/2017 Pathology Results    1. Lymph node, sentinel, biopsy, right external iliac - NO CARCINOMA IDENTIFIED IN ONE LYMPH NODE (0/1) - SEE COMMENT 2. Lymph node, sentinel, biopsy, left external iliac #1 - NO CARCINOMA IDENTIFIED IN ONE LYMPH NODE (0/1) - SEE COMMENT 3. Lymph node, sentinel, biopsy, left external iliac #2 - NO CARCINOMA IDENTIFIED IN ONE LYMPH NODE (0/1) - SEE COMMENT 4. Uterus +/- tubes/ovaries, neoplastic, ovaries UTERUS: - ENDOMETRIOID CARCINOMA, FIGO GRADE 2, INVOLVING THE ENTIRE POSTERIOR MYOMETRIUM WITH TUMOR PRESENT AT INKED SEROSAL SURFACE - SEE ONCOLOGY TABLE BELOW CERVIX: - NABOTHIAN CYSTS - NO DYSPLASIA OR MALIGNANCY IDENTIFIED BILATERAL OVARIES: - NO  CARCINOMA IDENTIFIED BILATERAL FALLOPIAN TUBES: - ENDOMETRIOID CARCINOMA INVOLVING LEFT FALLOPIAN TUBE  1. , 2 and 3. Cytokeratin AE1/3 was performed on the sentinel lymph nodes to exclude micrometastasis; they are negative. 4.  ONCOLOGY TABLE-UTERUS, CARCINOMA OR CARCINOSARCOMA Specimen: Uterus, bilateral fallopian tubes and ovaries Procedure: Total hysterectomy and bilateral salpingo-oophorectomy Histologic type: Endometrioid carcinoma, NOS Grade: FIGO Grade 2 Myometrial invasion: >50% (The posterior myometrium has full thickness involvement with tumor extending to the inked serosal surface) Uterine Serosa involvement: Present Cervical stromal involvement: Not identified Other Tissue/Organ Involvement: Left fallopian tube Lymphovascular invasion: Not identified Peritoneal washings: Not submitted/unknown Lymph nodes: Examined: 3 Sentinel 0 Non-sentinel 3 Total Lymph nodes with metastasis: 0 Isolated tumor cells (< 0.2 mm): 0 Micrometastasis: (> 0.2 mm and < 2.0 mm): 0 Macrometastasis: (> 2.0 mm): 0 Extracapsular extension: N/A TNM code: pT3a, pN0 FIGO Stage (based on pathologic findings, needs clinical correlation): IIIa *Block 4I is representative of the tumor and suitable for additional studies, if requested. COMMENT: MMR (by immunohistochemistry) is pending and will be reported in an addendum.      08/05/2017 Surgery    Operation: Robotic-assisted laparoscopic total hysterectomy with bilateral salpingoophorectomy, SLN biopsy, umbilical hernia  Surgeon: Donaciano Eva  Operative Findings:  : 6cm normal appearing uterus, normal right tube and ovary, left ovary with cystic mass, left sigmoid colon diverticular disease, adhesions to left ovary.  No gross extrauterine disease.         08/14/2017 Imaging    1. Interval hysterectomy. No findings of recurrent or metastatic disease. 2. Small volume pelvic fluid and a presumed postoperative seroma or lymphangioma within the left hemipelvis. 3. Subtle irregular hepatic capsule is suspicious for mild cirrhosis. Correlate with risk factors. 4. Coronary artery atherosclerosis. Aortic Atherosclerosis (ICD10-I70.0).      08/27/2017 Procedure     Successful placement of a right internal jugular approach power injectable Port-A-Cath. The catheter is ready for immediate use.        Past Medical History:  Diagnosis Date  . Cataract   . Endometrial adenocarcinoma ([place])   . Environmental and seasonal allergies   . Family history of ovarian cancer   . Family history of pancreatic cancer   . GERD (gastroesophageal reflux disease)   . History of DVT of lower extremity    early 2000s--- lower right leg  . Hyperlipidemia   . Hypertension   . Hypothyroidism   . Type 2 diabetes mellitus ([place])    followed by pcp  . Umbilical hernia   . Vitamin B 12 deficiency   . Vitamin D deficiency   . Wears glasses     Past Surgical History:  Procedure Laterality Date  . BREAST BIOPSY Left 2009   benign  . CATARACT EXTRACTION W/ INTRAOCULAR LENS IMPLANT Left 2018  . CATARACT EXTRACTION W/PHACO Right 03/14/2014   Procedure: CATARACT EXTRACTION PHACO AND INTRAOCULAR LENS PLACEMENT (IOC);  Surgeon: Tonny Branch, MD;  Location: [facility];  Service: Ophthalmology;  Laterality: Right;  CDE: 8.34  . FOOT GANGLION EXCISION Right 2013  . HYSTEROSCOPY W/D&C N/A 06/13/2017   Procedure: DILATATION AND CURETTAGE /HYSTEROSCOPY;  Surgeon: Alden Hipp, MD;  Location: [facility];  Service: Gynecology;  Laterality: N/A;  . IR FLUORO GUIDE PORT INSERTION RIGHT  08/27/2017  . IR US GUIDE VASC ACCESS RIGHT  08/27/2017  . LAPAROSCOPIC CHOLECYSTECTOMY  2005  . ROBOTIC ASSISTED TOTAL HYSTERECTOMY WITH BILATERAL SALPINGO OOPHERECTOMY Bilateral 08/05/2017   Procedure: ROBOTIC ASSISTED TOTAL HYSTERECTOMY WITH BILATERAL SALPINGO OOPHORECTOMY;  Surgeon: Denman George,  Terrence Dupont, MD;  Location: [facility];  Service: Gynecology;  Laterality: Bilateral;  . SENTINEL NODE BIOPSY Bilateral 08/05/2017   Procedure: SENTINEL NODE BIOPSY;  Surgeon: Everitt Amber, MD;  Location: [facility];  Service: Gynecology;  Laterality: Bilateral;  . UMBILICAL HERNIA REPAIR N/A 08/05/2017   Procedure: HERNIA REPAIR UMBILICAL  ADULT;  Surgeon: Everitt Amber, MD;  Location: [facility];  Service: Gynecology;  Laterality: N/A;  . VEIN SURGERY Right 2011   vein stripping-leg  and laser treatment    Social History   Socioeconomic History  . Marital status: Married    Spouse name: Not on file  . Number of children: Not on file  . Years of education: Not on file  . Highest education level: Not on file  Social Needs  . Financial resource strain: Not on file  . Food insecurity - worry: Not on file  . Food insecurity - inability: Not on file  . Transportation needs - medical: Not on file  . Transportation needs - non-medical: Not on file  Occupational History  . Occupation: retired  Tobacco Use  . Smoking status: Never Smoker  . Smokeless tobacco: Never Used  Substance and Sexual Activity  . Alcohol use: No  . Drug use: No  . Sexual activity: Yes    Birth control/protection: Post-menopausal  Other Topics Concern  . Not on file  Social History Narrative  . Not on file     Family History:  During the visit, a 4-generation pedigree was obtained. Family tree will be scanned in the Media tab in Epic  Significant diagnoses include the following:  Family History  Problem Relation Age of Onset  . Pancreatic cancer Mother 44       deceased 30  . Ovarian cancer Mother        dx 88s  . Cancer Other 47       maternal half-sister; unk. type    Additionally, Ms. Kocurek has a son and 3 daughters (ages 42-54).  She has two full sisters. She also has a maternal half-sister and 2 half-brothers. Her mother had 4 brothers and a sister. Her father died in his 49s in a motor vehicle accident. He had many siblings, but she has no information about them.  Ms. Duffin ancestry is Caucasian - NOS. There is no known Jewish ancestry and no consanguinity.  Discussion: We reviewed the characteristics, features and inheritance patterns of hereditary cancer syndromes. We discussed her risk of harboring a mutation in the context of  her personal and family history. We discussed the process of genetic testing, insurance coverage and implications of results: positive, negative and variant of unknown significance (VUS).    Ms. Klump questions were answered to her satisfaction today and she is welcome to call with any additional questions or concerns. Thank you for the referral and allowing [place] to share in the care of your patient.    Steele Berg, MS, [place] Certified Genetic Counselor phone: [phone] [email or web address]

## 2017-09-17 ENCOUNTER — Telehealth: Payer: Self-pay | Admitting: *Deleted

## 2017-09-17 NOTE — Telephone Encounter (Signed)
Called patient to remind of HDR [facility] for 09-18-17, spoke with patient and she is aware of these appts.

## 2017-09-18 ENCOUNTER — Other Ambulatory Visit: Payer: Self-pay

## 2017-09-18 ENCOUNTER — Ambulatory Visit
Admission: RE | Admit: 2017-09-18 | Discharge: 2017-09-18 | Disposition: A | Discharge status: Home / Self Care | Payer: Medicare Other | Source: Ambulatory Visit | Attending: Radiation Oncology | Admitting: Radiation Oncology

## 2017-09-18 ENCOUNTER — Encounter: Payer: Self-pay | Admitting: Radiation Oncology

## 2017-09-18 VITALS — BP 121/69 | HR 81 | Temp 98.4°F | Resp 18 | Wt 165.5 lb

## 2017-09-18 DIAGNOSIS — Z7982 Long term (current) use of aspirin: Secondary | ICD-10-CM | POA: Diagnosis not present

## 2017-09-18 DIAGNOSIS — E039 Hypothyroidism, unspecified: Secondary | ICD-10-CM | POA: Insufficient documentation

## 2017-09-18 DIAGNOSIS — K219 Gastro-esophageal reflux disease without esophagitis: Secondary | ICD-10-CM | POA: Diagnosis not present

## 2017-09-18 DIAGNOSIS — E559 Vitamin D deficiency, unspecified: Secondary | ICD-10-CM | POA: Insufficient documentation

## 2017-09-18 DIAGNOSIS — Z86718 Personal history of other venous thrombosis and embolism: Secondary | ICD-10-CM | POA: Diagnosis not present

## 2017-09-18 DIAGNOSIS — E119 Type 2 diabetes mellitus without complications: Secondary | ICD-10-CM | POA: Diagnosis not present

## 2017-09-18 DIAGNOSIS — Z79899 Other long term (current) drug therapy: Secondary | ICD-10-CM | POA: Diagnosis not present

## 2017-09-18 DIAGNOSIS — E785 Hyperlipidemia, unspecified: Secondary | ICD-10-CM | POA: Diagnosis not present

## 2017-09-18 DIAGNOSIS — C5702 Malignant neoplasm of left fallopian tube: Secondary | ICD-10-CM | POA: Insufficient documentation

## 2017-09-18 DIAGNOSIS — C541 Malignant neoplasm of endometrium: Secondary | ICD-10-CM

## 2017-09-18 DIAGNOSIS — Z79890 Hormone replacement therapy (postmenopausal): Secondary | ICD-10-CM | POA: Diagnosis not present

## 2017-09-18 DIAGNOSIS — Z882 Allergy status to sulfonamides status: Secondary | ICD-10-CM | POA: Diagnosis not present

## 2017-09-18 DIAGNOSIS — C569 Malignant neoplasm of unspecified ovary: Secondary | ICD-10-CM | POA: Diagnosis present

## 2017-09-18 DIAGNOSIS — Z90711 Acquired absence of uterus with remaining cervical stump: Secondary | ICD-10-CM | POA: Diagnosis not present

## 2017-09-18 DIAGNOSIS — I10 Essential (primary) hypertension: Secondary | ICD-10-CM | POA: Insufficient documentation

## 2017-09-18 DIAGNOSIS — Z510 Encounter for antineoplastic radiation therapy: Secondary | ICD-10-CM | POA: Diagnosis not present

## 2017-09-18 DIAGNOSIS — Z7984 Long term (current) use of oral hypoglycemic drugs: Secondary | ICD-10-CM | POA: Diagnosis not present

## 2017-09-18 DIAGNOSIS — E538 Deficiency of other specified B group vitamins: Secondary | ICD-10-CM | POA: Diagnosis not present

## 2017-09-18 DIAGNOSIS — Z808 Family history of malignant neoplasm of other organs or systems: Secondary | ICD-10-CM | POA: Diagnosis not present

## 2017-09-18 DIAGNOSIS — Z888 Allergy status to other drugs, medicaments and biological substances status: Secondary | ICD-10-CM | POA: Diagnosis not present

## 2017-09-18 DIAGNOSIS — Z8041 Family history of malignant neoplasm of ovary: Secondary | ICD-10-CM | POA: Insufficient documentation

## 2017-09-18 NOTE — Progress Notes (Signed)
  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 She will be treated with a 3.0 cm diameter segmented cylinder. This conforms to her anatomy without undue discomfort.  Vaginal brachytherapy procedure node: The patient was brought to the [facility] suite. Identity was confirmed. All relevant records and images related to the planned course of therapy were reviewed. The patient freely provided informed written consent to proceed with treatment after reviewing the details related to the planned course of therapy. The consent form was witnessed and verified by the simulation staff. Then, the patient was set-up in a stable reproducible supine position for radiation therapy. Pelvic exam revealed the vaginal cuff to be intact . The patient's custom vaginal cylinder was placed in the proximal vagina. This was affixed to the CT/MR stabilization plate to prevent slippage. Patient tolerated the placement well.  Verification simulation note:  A fiducial marker was placed within the vaginal cylinder. An AP and lateral film was then obtained through the pelvis area. This documented accurate position of the vaginal cylinder for treatment.  HDR BRACHYTHERAPY TREATMENT  The remote afterloading device was affixed to the vaginal cylinder by catheter. Patient then proceeded to undergo her first high-dose-rate treatment directed at the proximal vagina. The patient was prescribed a dose of 6.0 gray to be delivered to the mucosal surface. Treatment length was 3.0 cm. Patient was treated with 1 channel using 7 dwell positions. Treatment time was 341.1  seconds. Iridium 192 was the high-dose-rate source for treatment. The patient tolerated the treatment well. After completion of her therapy, a radiation survey was performed documenting return of the iridium source into the [employer] safe.   PLAN: the patient will return next week  For her second high-dose-rate treatment. ________________________________  Blair Promise, PhD, MD

## 2017-09-18 NOTE — Progress Notes (Addendum)
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 female who presents today out of the courtesy of Dr. Everitt Amber. She presents with a diagnosis of  adenocarcinoma of the endometrium who was originally diagnosed in October 2018 following a prolonged workup with GYN and Urology. She was counseled on surgery and underwent robotic hysterectomy with BSO and lymph node dissection on 08/05/17. Pathology revealed a grade 2 endometrioid adenocarcinoma,extending to the entire posterior myometrium with tumor noted at inked serosal surface, no involvement of the 3 sampled nodes, and no disease in the cervix, or ovaries, however there was  carcinoma in the left fallopian tube. CT of the chest, abdomen, and pelvis on 08/14/17 showed interval hysterectomy with no evidence of recurrence or metastatic disease. Small volume pelvic fluid and a presumed postoperative seroma or lymphangioma noted within the left hemipelvis. The patient started carboplatin/ taxol on 09/02/17 with Dr. Alvy Bimler.  PREVIOUS RADIATION THERAPY: No Past Medical History:  Past Medical History:  Diagnosis Date  . Cataract   . Endometrial adenocarcinoma ([place])   . Environmental and seasonal allergies   . Family history of ovarian cancer   . Family history of pancreatic cancer   . GERD (gastroesophageal reflux disease)   . History of DVT of lower extremity    early 2000s--- lower right leg  . Hyperlipidemia   . Hypertension   . Hypothyroidism   . Type 2 diabetes mellitus ([place])    followed by pcp  . Umbilical hernia   . Vitamin B 12 deficiency    . Vitamin D deficiency   . Wears glasses     Past Surgical History: Past Surgical History:  Procedure Laterality Date  . BREAST BIOPSY Left 2009   benign  . CATARACT EXTRACTION W/ INTRAOCULAR LENS IMPLANT Left 2018  . CATARACT EXTRACTION W/PHACO Right 03/14/2014   Procedure: CATARACT EXTRACTION PHACO AND INTRAOCULAR LENS PLACEMENT (IOC);  Surgeon: Tonny Branch, MD;  Location: [facility];  Service: Ophthalmology;  Laterality: Right;  CDE: 8.34  . FOOT GANGLION EXCISION Right 2013  . HYSTEROSCOPY W/D&C N/A 06/13/2017   Procedure: DILATATION AND CURETTAGE /HYSTEROSCOPY;  Surgeon: Alden Hipp, MD;  Location: [facility];  Service: Gynecology;  Laterality: N/A;  . IR FLUORO GUIDE PORT INSERTION RIGHT  08/27/2017  . IR US GUIDE VASC ACCESS RIGHT  08/27/2017  . LAPAROSCOPIC CHOLECYSTECTOMY  2005  . ROBOTIC ASSISTED TOTAL HYSTERECTOMY WITH BILATERAL SALPINGO OOPHERECTOMY Bilateral 08/05/2017   Procedure: ROBOTIC ASSISTED TOTAL HYSTERECTOMY WITH BILATERAL SALPINGO OOPHORECTOMY;  Surgeon: Everitt Amber, MD;  Location: [facility];  Service: Gynecology;  Laterality: Bilateral;  . SENTINEL NODE BIOPSY Bilateral 08/05/2017   Procedure: SENTINEL NODE BIOPSY;  Surgeon: Everitt Amber, MD;  Location: [facility];  Service: Gynecology;  Laterality: Bilateral;  . UMBILICAL HERNIA REPAIR N/A 08/05/2017   Procedure: HERNIA REPAIR UMBILICAL ADULT;  Surgeon: Everitt Amber, MD;  Location: [facility];  Service: Gynecology;  Laterality: N/A;  . VEIN SURGERY Right 2011   vein stripping-leg  and laser treatment    Social History:  Social History   Socioeconomic History  . Marital status: Married  Spouse name: Not on file  . Number of children: Not on file  . Years of education: Not on file  . Highest education level: Not on file  Occupational History  . Occupation: retired  Scientific laboratory technician  . Financial resource strain: Not on file  . Food insecurity:    Worry: Not on file    Inability: Not on file  . Transportation needs:    Medical:  Not on file    Non-medical: Not on file  Tobacco Use  . Smoking status: Never Smoker  . Smokeless tobacco: Never Used  Substance and Sexual Activity  . Alcohol use: No  . Drug use: No  . Sexual activity: Yes    Birth control/protection: Post-menopausal  Lifestyle  . Physical activity:    Days per week: Not on file    Minutes per session: Not on file  . Stress: Not on file  Relationships  . Social connections:    Talks on phone: Not on file    Gets together: Not on file    Attends religious service: Not on file    Active member of club or organization: Not on file    Attends meetings of clubs or organizations: Not on file    Relationship status: Not on file  . Intimate partner violence:    Fear of current or ex partner: Not on file    Emotionally abused: Not on file    Physically abused: Not on file    Forced sexual activity: Not on file  Other Topics Concern  . Not on file  Social History Narrative  . Not on file  The patient is married and  lives in [place], [place].   Family History: Family History  Problem Relation Age of Onset  . Pancreatic cancer Mother 76       deceased 64  . Ovarian cancer Mother        dx 59s  . Cancer Other 60       maternal half-sister; unk. type   ALLERGIES: Ace inhibitors; Norvasc [amlodipine]; Latex; Neosporin [neomycin-bacitracin zn-polymyx]; and Sulfa antibiotics  MEDICATIONS:  Current Outpatient Medications  Medication Sig Dispense Refill  . acetaminophen (TYLENOL) 500 MG tablet Take 1,000 mg by mouth every 8 (eight) hours as needed for mild pain or headache.    Marland Kitchen aspirin EC 81 MG tablet Take 81 mg by mouth daily.    . Calcium Citrate-Vitamin D ([employer] CALCIUM CITRATE +D PO) Take 2 tablets by mouth every evening.     . Cholecalciferol (D 2000) 2000 units TABS Take 2,000 Units by mouth daily.    . cloNIDine (CATAPRES) 0.1 MG tablet TAKE 1 TABLET BY MOUTH TWICE DAILY 180 tablet 1  . cyanocobalamin (,VITAMIN B-12,) 1000 MCG/ML injection  Inject 1,000 mcg into the muscle every Thursday.    . cycloSPORINE (RESTASIS) 0.05 % ophthalmic emulsion Place 1 drop into both eyes 2 (two) times daily.     Marland Kitchen dexamethasone (DECADRON) 4 MG tablet Take 3 tabs at night and 3 tabs at 6 am the morning of chemotherapy, every 3 weeks with food 36 tablet 0  . Famotidine (PEPCID AC PO) Take by mouth every morning.    . furosemide (LASIX) 40 MG tablet TAKE 1 TABLET BY MOUTH ONCE DAILY 90 tablet 0  . JANUVIA 50 MG tablet TAKE 1 TABLET BY MOUTH ONCE DAILY 90 tablet 0  . levothyroxine (SYNTHROID, LEVOTHROID) 100 MCG tablet TAKE 1 TABLET BY MOUTH ONCE DAILY (Patient taking differently: TAKE 1 TABLET  BY MOUTH ONCE DAILY--- takes in am) 90 tablet 2  . lidocaine-prilocaine (EMLA) cream Apply to affected area once 30 g 3  . losartan (COZAAR) 100 MG tablet TAKE 1 TABLET BY MOUTH ONCE DAILY 90 tablet 0  . Magnesium 250 MG TABS Take 250 mg by mouth every evening.     . meclizine (ANTIVERT) 32 MG tablet Take 1 tablet (32 mg total) by mouth 3 (three) times daily as needed for dizziness. 30 tablet 0  . metFORMIN (GLUCOPHAGE) 500 MG tablet TAKE 2 TABLETS BY MOUTH ONCE DAILY IN THE MORNING AND TAKE 1 TABLET ONCE DAILY IN THE EVENING (Patient taking differently: Take 500-1,000 mg by mouth 2 (two) times daily with a meal. TAKE 2 TABLETS BY MOUTH ONCE DAILY IN THE MORNING AND TAKE 1 TABLET ONCE DAILY IN THE EVENING) 270 tablet 1  . metoprolol succinate (TOPROL-XL) 50 MG 24 hr tablet TAKE ONE TABLET BY MOUTH TWICE DAILY WITH  OR  IMMEDIATELY  FOLLOWING  A  MEAL 180 tablet 4  . Omega-3 Fatty Acids (FISH OIL) 1000 MG CAPS Take 1,000 mg by mouth every evening.    . ondansetron (ZOFRAN) 8 MG tablet Take 1 tablet (8 mg total) by mouth 2 (two) times daily as needed for refractory nausea / vomiting. Start on day 3 after chemo. 30 tablet 1  . ONETOUCH VERIO test strip USE TO CHECK GLUCOSE DAILY 100 each 3  . prochlorperazine (COMPAZINE) 10 MG tablet Take 1 tablet (10 mg total) by mouth  every 6 (six) hours as needed (Nausea or vomiting). 30 tablet 1  . simvastatin (ZOCOR) 40 MG tablet TAKE 1 TABLET BY MOUTH ONCE DAILY AT 6PM 90 tablet 1  . vitamin C (ASCORBIC ACID) 500 MG tablet Take 500 mg by mouth daily.    Marland Kitchen albuterol (PROVENTIL HFA;VENTOLIN HFA) 108 (90 BASE) MCG/ACT inhaler Inhale 2 puffs into the lungs every 6 (six) hours as needed for wheezing or shortness of breath. (Patient not taking: Reported on 09/08/2017) 1 Inhaler 1  . meclizine (ANTIVERT) 50 MG tablet Take 1 tablet (50 mg total) by mouth 2 (two) times daily as needed. (Patient not taking: Reported on 09/18/2017) 30 tablet 0  . ONETOUCH DELICA LANCETS 91B MISC USE ONE  TO CHECK GLUCOSE ONCE DAILY 100 each 1   Current Facility-Administered Medications  Medication Dose Route Frequency Provider Last Rate Last Dose  . cyanocobalamin ((VITAMIN B-12)) injection 1,000 mcg  1,000 mcg Intramuscular Daily Evelina Dun A, FNP   1,000 mcg at 08/26/17 1660    REVIEW OF SYSTEMS:On review of systems, the patient reports that she is doing well overall. She denies any chest pain, shortness of breath, cough, fevers, chills, night sweats, unintended weight changes. She denies any bowel or bladder disturbances, and denies abdominal pain, nausea or vomiting. She is not having any vaginal bleeding or discharge. denies any new musculoskeletal or joint aches or pains, new skin lesions or concerns. A complete review of systems is obtained and is otherwise negative.     PHYSICAL EXAM:  weight is 165 lb 8 oz (75.1 kg). Her temperature is 98.4 F (36.9 C). Her blood pressure is 121/69 and her pulse is 81. Her respiration is 18 and oxygen saturation is 96%.   In general this is a well appearing caucasian female in no acute distress. She is alert and oriented x4 and appropriate throughout the examination. HEENT reveals that the patient is normocephalic, atraumatic. EOMs are intact. PERRLA. Skin is intact without any evidence  of gross lesions.  Cardiovascular exam reveals a regular rate and rhythm, no clicks rubs or murmurs are auscultated. Chest is clear to auscultation bilaterally. Lymphatic assessment is performed and does not reveal any adenopathy in the cervical, supraclavicular, axillary, or inguinal chains. Abdomen has active bowel sounds in all quadrants and is intact. The abdomen is soft, non tender, non distended. Lower extremities are negative for pretibial pitting edema, deep calf tenderness, cyanosis or clubbing. Pelvic exam reveals normal appearing external female genitalia. No lesions are seen grossly. BUS is normal in appearance. Speculum exam reveals a well healed cuff. No separation is noted and bimanual exam confirms no separation or suture. Her cuff was instrumented with her vaginal cuff cylinder. She tolerated the procedure well. She will proceed to simulation for this.     ECOG = 0  0 - Asymptomatic (Fully active, able to carry on all predisease activities without restriction)  1 - Symptomatic but completely ambulatory (Restricted in physically strenuous activity but ambulatory and able to carry out work of a light or sedentary nature. For example, light housework, office work)  2 - Symptomatic, <50% in bed during the day (Ambulatory and capable of all self care but unable to carry out any work activities. Up and about more than 50% of waking hours)  3 - Symptomatic, >50% in bed, but not bedbound (Capable of only limited self-care, confined to bed or chair 50% or more of waking hours)  4 - Bedbound (Completely disabled. Cannot carry on any self-care. Totally confined to bed or chair)  5 - Death   Eustace Pen MM, Creech RH, Tormey DC, et al. [phone]). "Toxicity and response criteria of the [facility]". [place] Oncol. 5 (6): 649-55  LABORATORY DATA:  Lab Results  Component Value Date   WBC 13.7 (H) 09/11/2017   HGB 13.1 08/27/2017   HCT 36.0 09/11/2017   MCV 90.5 09/11/2017   PLT 171  09/11/2017   NEUTROABS 9.9 (H) 09/11/2017   Lab Results  Component Value Date   NA 140 09/11/2017   K 4.8 09/11/2017   CL 101 09/11/2017   CO2 30 (H) 09/11/2017   GLUCOSE 168 (H) 09/11/2017   CREATININE 1.02 09/11/2017   CALCIUM 11.0 (H) 09/11/2017      RADIOGRAPHY: Ir US Guide Vasc Access Right  Result Date: 08/27/2017 INDICATION: History of metastatic endometrial cancer in need of durable intravenous access for chemotherapy administration. EXAM: IMPLANTED PORT A CATH PLACEMENT WITH ULTRASOUND AND FLUOROSCOPIC GUIDANCE COMPARISON:  Chest CT - 08/14/2017 MEDICATIONS: Ancef 2 gm IV; The antibiotic was administered within an appropriate time interval prior to skin puncture. ANESTHESIA/SEDATION: Moderate (conscious) sedation was employed during this procedure. A total of Versed 1 mg and Fentanyl 50 mcg was administered intravenously. Moderate Sedation Time: 36 minutes. The patient's level of consciousness and vital signs were monitored continuously by radiology nursing throughout the procedure under my direct supervision. CONTRAST:  None FLUOROSCOPY TIME:  2 minutes, 36 seconds (3 mGy) COMPLICATIONS: None immediate. PROCEDURE: The procedure, risks, benefits, and alternatives were explained to the patient. Questions regarding the procedure were encouraged and answered. The patient understands and consents to the procedure. The right neck and chest were prepped with chlorhexidine in a sterile fashion, and a sterile drape was applied covering the operative field. Maximum barrier sterile technique with sterile gowns and gloves were used for the procedure. A timeout was performed prior to the initiation of the procedure. Local anesthesia was provided with 1% lidocaine with epinephrine.  After creating a small venotomy incision, a micropuncture kit was utilized to access the internal jugular vein. Real-time ultrasound guidance was utilized for vascular access including the acquisition of a permanent  ultrasound image documenting patency of the accessed vessel. The microwire was utilized to measure appropriate catheter length. A subcutaneous port pocket was then created along the upper chest wall utilizing a combination of sharp and blunt dissection. The pocket was irrigated with sterile saline. A single lumen ISP power injectable port was chosen for placement. The 8 Fr catheter was tunneled from the port pocket site to the venotomy incision. The port was placed in the pocket. The external catheter was trimmed to appropriate length. At the venotomy, an 8 Fr peel-away sheath was placed over a guidewire under fluoroscopic guidance. The catheter was then placed through the sheath and the sheath was removed. Final catheter positioning was confirmed and documented with a fluoroscopic spot radiograph. The port was accessed with a Huber needle, aspirated and flushed with heparinized saline. The venotomy site was closed with an interrupted 4-0 Vicryl suture. The port pocket incision was closed with interrupted 2-0 Vicryl suture and the skin was opposed with a running subcuticular 4-0 Vicryl suture. Dermabond and Steri-strips were applied to both incisions. Dressings were placed. The patient tolerated the procedure well without immediate post procedural complication. FINDINGS: After catheter placement, the tip lies within the superior cavoatrial junction. The catheter aspirates and flushes normally and is ready for immediate use. IMPRESSION: Successful placement of a right internal jugular approach power injectable Port-A-Cath. The catheter is ready for immediate use. Electronically Signed   By: Sandi Mariscal M.D.   On: 08/27/2017 13:02   Ir Fluoro Guide Port Insertion Right  Result Date: 08/27/2017 INDICATION: History of metastatic endometrial cancer in need of durable intravenous access for chemotherapy administration. EXAM: IMPLANTED PORT A CATH PLACEMENT WITH ULTRASOUND AND FLUOROSCOPIC GUIDANCE COMPARISON:  Chest  CT - 08/14/2017 MEDICATIONS: Ancef 2 gm IV; The antibiotic was administered within an appropriate time interval prior to skin puncture. ANESTHESIA/SEDATION: Moderate (conscious) sedation was employed during this procedure. A total of Versed 1 mg and Fentanyl 50 mcg was administered intravenously. Moderate Sedation Time: 36 minutes. The patient's level of consciousness and vital signs were monitored continuously by radiology nursing throughout the procedure under my direct supervision. CONTRAST:  None FLUOROSCOPY TIME:  2 minutes, 36 seconds (3 mGy) COMPLICATIONS: None immediate. PROCEDURE: The procedure, risks, benefits, and alternatives were explained to the patient. Questions regarding the procedure were encouraged and answered. The patient understands and consents to the procedure. The right neck and chest were prepped with chlorhexidine in a sterile fashion, and a sterile drape was applied covering the operative field. Maximum barrier sterile technique with sterile gowns and gloves were used for the procedure. A timeout was performed prior to the initiation of the procedure. Local anesthesia was provided with 1% lidocaine with epinephrine. After creating a small venotomy incision, a micropuncture kit was utilized to access the internal jugular vein. Real-time ultrasound guidance was utilized for vascular access including the acquisition of a permanent ultrasound image documenting patency of the accessed vessel. The microwire was utilized to measure appropriate catheter length. A subcutaneous port pocket was then created along the upper chest wall utilizing a combination of sharp and blunt dissection. The pocket was irrigated with sterile saline. A single lumen ISP power injectable port was chosen for placement. The 8 Fr catheter was tunneled from the port pocket site to the venotomy incision. The port was  placed in the pocket. The external catheter was trimmed to appropriate length. At the venotomy, an 8 Fr  peel-away sheath was placed over a guidewire under fluoroscopic guidance. The catheter was then placed through the sheath and the sheath was removed. Final catheter positioning was confirmed and documented with a fluoroscopic spot radiograph. The port was accessed with a Huber needle, aspirated and flushed with heparinized saline. The venotomy site was closed with an interrupted 4-0 Vicryl suture. The port pocket incision was closed with interrupted 2-0 Vicryl suture and the skin was opposed with a running subcuticular 4-0 Vicryl suture. Dermabond and Steri-strips were applied to both incisions. Dressings were placed. The patient tolerated the procedure well without immediate post procedural complication. FINDINGS: After catheter placement, the tip lies within the superior cavoatrial junction. The catheter aspirates and flushes normally and is ready for immediate use. IMPRESSION: Successful placement of a right internal jugular approach power injectable Port-A-Cath. The catheter is ready for immediate use. Electronically Signed   By: Sandi Mariscal M.D.   On: 08/27/2017 13:02      IMPRESSION/PLAN:  1. 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 Dr. Sondra Come reviews the rationale for treating the cuff to prevent recurrence.  We discussed the risks, benefits, short, and long term effects of radiotherapy, and the patient is interested in proceeding. Dr. Sondra Come discusses the delivery and logistics of radiotherapy and anticipates a course of 5 HDR treatments to the vaginal cuff. She has already signed consent and will proceed today to simulation and for her first treatment.  The patient will be treated with a 3 cm diameter segmented cylinder with a treatment length of 3 cm. Iridium 192 will be the high-dose-rate source.  2. Possible genetic predisposition to malignancy. She has met with genetics, and been tested and genetic counseling will follow upw with her  regarding her results.   In a visit lasting 25 minutes, greater than 50% of the time was spent face to face discussing her case, and coordinating the patient's care.   The above documentation reflects my direct findings during this shared patient visit.     Carola Rhine, PAC  -----------------------------------  Blair Promise, PhD, MD

## 2017-09-18 NOTE — Progress Notes (Signed)
Alexandria Irwin is here for her follow-up appointment. Denies any pain.States that she has mild fatigue.Denies any nausea or vomiting. Denies any issues with her bowels. Denies any dysuria. Denies any vaginal or rectal bleeding. Denies any vaginal discharge. Denies any skin issues. Vitals:   09/18/17 0804  BP: 121/69  Pulse: 81  Resp: 18  Temp: 98.4 F (36.9 C)  SpO2: 96%  Weight: 165 lb 8 oz (75.1 kg)   Wt Readings from Last 3 Encounters:  09/18/17 165 lb 8 oz (75.1 kg)  09/08/17 166 lb (75.3 kg)  08/29/17 170 lb 12.8 oz (77.5 kg)

## 2017-09-18 NOTE — Progress Notes (Signed)
  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  Identity was confirmed.  All relevant records and images related to the planned course of therapy were reviewed.  The patient freely provided informed written consent to proceed with treatment after reviewing the details related to the planned course of therapy. The consent form was witnessed and verified by the simulation staff.  Then, the patient was set-up in a stable reproducible  supine position for radiation therapy.  CT images were obtained.  Surface markings were placed.  The CT images were loaded into the planning software.  Then the target and avoidance structures were contoured.  Treatment planning then occurred.  The radiation prescription was entered and confirmed.   I have requested : Brachytherapy Isodose Plan and Dosimetry Calculations to plan the radiation distribution.    PLAN:  The patient will receive 30 Gy in 5 fractions. Patient will be treated with iridium 192 as the high dose rate source. She will Be treated with the 3.0 cm diameter segmented cylinder with . Treatment length of 3 cm. Prescription will be to the mucosal surface.    ________________________________  Blair Promise, PhD, MD

## 2017-09-19 ENCOUNTER — Encounter: Payer: Self-pay | Admitting: Genetic Counselor

## 2017-09-19 ENCOUNTER — Ambulatory Visit: Payer: Self-pay | Admitting: Genetic Counselor

## 2017-09-19 DIAGNOSIS — Z1379 Encounter for other screening for genetic and chromosomal anomalies: Secondary | ICD-10-CM

## 2017-09-19 HISTORY — DX: Encounter for other screening for genetic and chromosomal anomalies: Z13.79

## 2017-09-19 NOTE — Progress Notes (Signed)
[facility]etics [facility]       Genetic Test Results    Patient Name: Alexandria Irwin Patient DOB: 1945/10/15 Patient Age: 72 y.o. Encounter Date: 09/19/2017  Referring Provider: Heath Lark, MD  Primary Care Provider: Sharion Balloon, FNP   Alexandria Irwin was called today to discuss genetic test results. Please see the Genetics note from her visit on 09/11/2017 for a detailed discussion of her personal and family history.  Genetic Testing: At the time of Alexandria Irwin's visit, she decided to pursue genetic testing of multiple genes associated with hereditary susceptibility to cancer. Testing included sequencing and deletion/duplication analysis. Testing did not reveal a pathogenic mutation in any of the genes analyzed.  A copy of the genetic test report will be scanned into Epic under the Irwin tab.  The genes analyzed were the 83 genes on [facility]'s Multi-Cancer panel (ALK, APC, ATM, AXIN2, BAP1, BARD1, BLM, BMPR1A, BRCA1, BRCA2, BRIP1, CASR, CDC73, CDH1, CDK4, CDKN1B, CDKN1C, CDKN2A, CEBPA, CHEK2, CTNNA1, DICER1, DIS3L2, EGFR, EPCAM, FH, FLCN, GATA2, GPC3, GREM1, HOXB13, HRAS, KIT, MAX, MEN1, MET, MITF, MLH1, MSH2, MSH3, MSH6, MUTYH, NBN, NF1, NF2, NTHL1, PALB2, PDGFRA, PHOX2B, PMS2, POLD1, POLE, POT1, PRKAR1A, PTCH1, PTEN, RAD50, RAD51C, RAD51D, RB1, RECQL4, RET, RUNX1, SDHA, SDHAF2, SDHB, SDHC, SDHD, SMAD4, SMARCA4, SMARCB1, SMARCE1, STK11, SUFU, TERC, TERT, TMEM127, TP53, TSC1, TSC2, VHL, WRN, WT1).  Since the current test is not perfect, it is possible that there may be a gene mutation that current testing cannot detect, but that chance is small. It is possible that a different genetic factor, which has not yet been discovered or is not on this panel, is responsible for the cancer diagnoses in the family. Again, the likelihood of this is low. No additional testing is recommended at this time for Alexandria Irwin.  Cancer Screening: These results suggest that Alexandria Irwin's cancer was most  likely not due to an inherited predisposition. Most cancers happen by chance and this test, along with details of her family history, suggests that her cancer falls into this category. She is recommended to follow the cancer screening guidelines provided by her physician.   Family Members: Family members are at some increased risk of developing cancer, over the general population risk, simply due to the family history. Women are recommended to have a yearly mammogram beginning at age 97, a yearly clinical breast exam, a yearly gynecologic exam and perform monthly breast self-exams. Colon cancer screening is recommended to begin by age 20 in both men and women, unless there is a family history of colon cancer or colon polyps or an individual has a personal history to warrant initiating screening at a younger age.  Any relative who had cancer at a young age or had a particularly rare cancer may also wish to pursue genetic testing. Genetic counselors can be located in other cities, by visiting the website of the [employer] ([email or web address]) and Field seismologist for a Dietitian by zip code.   Lastly, cancer genetics is a rapidly advancing field and it is possible that new genetic tests will be appropriate for Alexandria Irwin in the future. We encourage her to remain in contact with [place] on an annual basis so we can update her personal and family histories, and let her know of advances in cancer genetics that may benefit the family. Our contact number was provided. Alexandria Irwin is welcome to call anytime with additional  questions.     Steele Berg, MS, [place] Certified Genetic Counselor phone: [phone]

## 2017-09-23 ENCOUNTER — Inpatient Hospital Stay: Payer: Medicare Other

## 2017-09-23 ENCOUNTER — Inpatient Hospital Stay (HOSPITAL_BASED_OUTPATIENT_CLINIC_OR_DEPARTMENT_OTHER): Payer: Medicare Other | Admitting: Hematology and Oncology

## 2017-09-23 ENCOUNTER — Encounter: Payer: Self-pay | Admitting: Hematology and Oncology

## 2017-09-23 ENCOUNTER — Telehealth: Payer: Self-pay | Admitting: Hematology and Oncology

## 2017-09-23 DIAGNOSIS — E119 Type 2 diabetes mellitus without complications: Secondary | ICD-10-CM | POA: Diagnosis not present

## 2017-09-23 DIAGNOSIS — R21 Rash and other nonspecific skin eruption: Secondary | ICD-10-CM | POA: Diagnosis not present

## 2017-09-23 DIAGNOSIS — C541 Malignant neoplasm of endometrium: Secondary | ICD-10-CM

## 2017-09-23 DIAGNOSIS — Z5111 Encounter for antineoplastic chemotherapy: Secondary | ICD-10-CM | POA: Diagnosis not present

## 2017-09-23 DIAGNOSIS — Z5189 Encounter for other specified aftercare: Secondary | ICD-10-CM | POA: Diagnosis not present

## 2017-09-23 LAB — CMP (CANCER CENTER ONLY)
ALBUMIN: 3.4 g/dL — AB (ref 3.5–5.0)
ALK PHOS: 50 U/L (ref 40–150)
ALT: 17 U/L (ref 0–55)
AST: 16 U/L (ref 5–34)
Anion gap: 11 (ref 3–11)
BUN: 13 mg/dL (ref 7–26)
CO2: 25 mmol/L (ref 22–29)
CREATININE: 0.87 mg/dL (ref 0.60–1.10)
Calcium: 9.5 mg/dL (ref 8.4–10.4)
Chloride: 102 mmol/L (ref 98–109)
GFR, Est AFR Am: >60 mL/min (ref >60)
GFR, Estimated: >60 mL/min (ref >60)
GLUCOSE: 279 mg/dL — AB (ref 70–140)
Potassium: 4.4 mmol/L (ref 3.5–5.1)
SODIUM: 138 mmol/L (ref 136–145)
Total Bilirubin: 0.4 mg/dL (ref 0.2–1.2)
Total Protein: 6.7 g/dL (ref 6.4–8.3)

## 2017-09-23 LAB — CBC WITH DIFFERENTIAL (CANCER CENTER ONLY)
BASOS PCT: 0 %
Basophils Absolute: 0 10*3/uL (ref 0.0–0.1)
EOS ABS: 0 10*3/uL (ref 0.0–0.5)
Eosinophils Relative: 0 %
HCT: 33.9 % — ABNORMAL LOW (ref 34.8–46.6)
Hemoglobin: 11.3 g/dL — ABNORMAL LOW (ref 11.6–15.9)
Lymphocytes Relative: 12 %
Lymphs Abs: 1 10*3/uL (ref 0.9–3.3)
MCH: 30.7 pg (ref 25.1–34.0)
MCHC: 33.3 g/dL (ref 31.5–36.0)
MCV: 92.1 fL (ref 79.5–101.0)
MONO ABS: 0.1 10*3/uL (ref 0.1–0.9)
MONOS PCT: 1 %
Neutro Abs: 7.6 10*3/uL — ABNORMAL HIGH (ref 1.5–6.5)
Neutrophils Relative %: 87 %
Platelet Count: 240 10*3/uL (ref 145–400)
RBC: 3.68 MIL/uL — ABNORMAL LOW (ref 3.70–5.45)
RDW: 14.2 % (ref 11.2–14.5)
WBC Count: 8.7 10*3/uL (ref 3.9–10.3)

## 2017-09-23 MED ORDER — SODIUM CHLORIDE 0.9 % IV SOLN
Freq: Once | INTRAVENOUS | Status: AC
  Administered 2017-09-23: 11:00:00 via INTRAVENOUS
  Filled 2017-09-23: qty 5

## 2017-09-23 MED ORDER — DIPHENHYDRAMINE HCL 50 MG/ML IJ SOLN
INTRAMUSCULAR | Status: AC
Start: 2017-09-23 — End: 2017-09-23
  Filled 2017-09-23: qty 1

## 2017-09-23 MED ORDER — HEPARIN SOD (PORK) LOCK FLUSH 100 UNIT/ML IV SOLN
500.0000 [IU] | Freq: Once | INTRAVENOUS | Status: AC | PRN
  Administered 2017-09-23: 500 [IU]
  Filled 2017-09-23: qty 5

## 2017-09-23 MED ORDER — SODIUM CHLORIDE 0.9% FLUSH
10.0000 mL | INTRAVENOUS | Status: DC | PRN
  Administered 2017-09-23: 10 mL
  Filled 2017-09-23: qty 10

## 2017-09-23 MED ORDER — SODIUM CHLORIDE 0.9% FLUSH
10.0000 mL | Freq: Once | INTRAVENOUS | Status: AC
  Administered 2017-09-23: 10 mL
  Filled 2017-09-23: qty 10

## 2017-09-23 MED ORDER — SODIUM CHLORIDE 0.9 % IV SOLN
175.0000 mg/m2 | Freq: Once | INTRAVENOUS | Status: AC
  Administered 2017-09-23: 324 mg via INTRAVENOUS
  Filled 2017-09-23: qty 54

## 2017-09-23 MED ORDER — PALONOSETRON HCL INJECTION 0.25 MG/5ML
INTRAVENOUS | Status: AC
  Filled 2017-09-23: qty 5

## 2017-09-23 MED ORDER — PALONOSETRON HCL INJECTION 0.25 MG/5ML
0.2500 mg | Freq: Once | INTRAVENOUS | Status: AC
  Administered 2017-09-23: 0.25 mg via INTRAVENOUS

## 2017-09-23 MED ORDER — FAMOTIDINE IN NACL 20-0.9 MG/50ML-% IV SOLN
20.0000 mg | Freq: Once | INTRAVENOUS | Status: AC
  Administered 2017-09-23: 20 mg via INTRAVENOUS

## 2017-09-23 MED ORDER — FAMOTIDINE IN NACL 20-0.9 MG/50ML-% IV SOLN
INTRAVENOUS | Status: AC
  Filled 2017-09-23: qty 50

## 2017-09-23 MED ORDER — SODIUM CHLORIDE 0.9 % IV SOLN
Freq: Once | INTRAVENOUS | Status: AC
  Administered 2017-09-23: 11:00:00 via INTRAVENOUS

## 2017-09-23 MED ORDER — DIPHENHYDRAMINE HCL 50 MG/ML IJ SOLN
50.0000 mg | Freq: Once | INTRAMUSCULAR | Status: AC
  Administered 2017-09-23: 50 mg via INTRAVENOUS

## 2017-09-23 MED ORDER — SODIUM CHLORIDE 0.9 % IV SOLN
516.6000 mg | Freq: Once | INTRAVENOUS | Status: AC
  Administered 2017-09-23: 520 mg via INTRAVENOUS
  Filled 2017-09-23: qty 52

## 2017-09-23 NOTE — Assessment & Plan Note (Signed)
She tolerated treatment well except for some mild skin rash affecting her hands which I do not think is related to the treatment and hyperglycemia I recommend reducing premedication dexamethasone for future cycle We would proceed with treatment without dose adjustment

## 2017-09-23 NOTE — Progress Notes (Signed)
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[facility]) She tolerated treatment well except for some mild skin rash affecting her hands which I do not think is related to the treatment and hyperglycemia I recommend reducing premedication dexamethasone for future cycle We would proceed with treatment without dose adjustment  Diabetes ([place]) The patient had significant dexamethasone-induced hyperglycemia Plan to reduce premedication dexamethasone for the future and I discussed dietary modification around the time of the chemotherapy  Skin rash She has recent skin rash, unlikely due to chemotherapy We discussed avoidance of certain sanitizer that could be causing skin rash on her hands   No orders of the defined types were placed in this encounter.   INTERVAL HISTORY: Please see below for problem oriented charting. She returns for cycle 2 of chemotherapy She developed some rash around her hands which could be due to some sort of sanitizer/soap She denies peripheral neuropathy No recent nausea, vomiting or changes in bowel habits  SUMMARY OF ONCOLOGIC HISTORY: Oncology History   endometroid MSI by IHC showed loss of nuclear expression, BRAF neg. Neg genetic testing     Endometrial adenocarcinoma ([place])   06/13/2017 Procedure    PROCEDURE: Hysteroscopy, D&C  SURGEON: Richard D. Deatra Ina M.D.  FINDINGS: Hypertrophic endometrial tissue, no polyps, no myoma         06/13/2017 Pathology Results    Endometrium, curettage ENDOMETRIOID ADENOCARCINOMA, FIGO GRADE 1      08/05/2017 Pathology Results    1. Lymph node, sentinel, biopsy, right external iliac - NO CARCINOMA IDENTIFIED IN ONE LYMPH NODE (0/1) - SEE COMMENT 2. Lymph node, sentinel, biopsy, left external iliac #1 - NO CARCINOMA IDENTIFIED IN ONE LYMPH NODE (0/1) - SEE COMMENT 3. Lymph node,  sentinel, biopsy, left external iliac #2 - NO CARCINOMA IDENTIFIED IN ONE LYMPH NODE (0/1) - SEE COMMENT 4. Uterus +/- tubes/ovaries, neoplastic, ovaries UTERUS: - ENDOMETRIOID CARCINOMA, FIGO GRADE 2, INVOLVING THE ENTIRE POSTERIOR MYOMETRIUM WITH TUMOR PRESENT AT INKED SEROSAL SURFACE - SEE ONCOLOGY TABLE BELOW CERVIX: - NABOTHIAN CYSTS - NO DYSPLASIA OR MALIGNANCY IDENTIFIED BILATERAL OVARIES: - NO CARCINOMA IDENTIFIED BILATERAL FALLOPIAN TUBES: - ENDOMETRIOID CARCINOMA INVOLVING LEFT FALLOPIAN TUBE  1. , 2 and 3. Cytokeratin AE1/3 was performed on the sentinel lymph nodes to exclude micrometastasis; they are negative. 4. ONCOLOGY TABLE-UTERUS, CARCINOMA OR CARCINOSARCOMA Specimen: Uterus, bilateral fallopian tubes and ovaries Procedure: Total hysterectomy and bilateral salpingo-oophorectomy Histologic type: Endometrioid carcinoma, NOS Grade: FIGO Grade 2 Myometrial invasion: >50% (The posterior myometrium has full thickness involvement with tumor extending to the inked serosal surface) Uterine Serosa involvement: Present Cervical stromal involvement: Not identified Other Tissue/Organ Involvement: Left fallopian tube Lymphovascular invasion: Not identified Peritoneal washings: Not submitted/unknown Lymph nodes: Examined: 3 Sentinel 0 Non-sentinel 3 Total Lymph nodes with metastasis: 0 Isolated tumor cells (< 0.2 mm): 0 Micrometastasis: (> 0.2 mm and < 2.0 mm): 0 Macrometastasis: (> 2.0 mm): 0 Extracapsular extension: N/A TNM code: pT3a, pN0 FIGO Stage (based on pathologic findings, needs clinical correlation): IIIa *Block 4I is representative of the tumor and suitable for additional studies, if requested. COMMENT: MMR (by immunohistochemistry) is pending and will be reported in an addendum.      08/05/2017 Surgery    Operation: Robotic-assisted laparoscopic total hysterectomy with bilateral salpingoophorectomy, SLN biopsy, umbilical hernia  Surgeon: Donaciano Eva  Operative Findings:  : 6cm normal appearing uterus, normal right tube  and ovary, left ovary with cystic mass, left sigmoid colon diverticular disease, adhesions to left ovary.  No gross extrauterine disease.         08/14/2017 Imaging    1. Interval hysterectomy. No findings of recurrent or metastatic disease. 2. Small volume pelvic fluid and a presumed postoperative seroma or lymphangioma within the left hemipelvis. 3. Subtle irregular hepatic capsule is suspicious for mild cirrhosis. Correlate with risk factors. 4. Coronary artery atherosclerosis. Aortic Atherosclerosis (ICD10-I70.0).      08/27/2017 Procedure    Successful placement of a right internal jugular approach power injectable Port-A-Cath. The catheter is ready for immediate use.      09/02/2017 -  Chemotherapy    The patient had carboplatin and Taxol      09/11/2017 Genetic Testing    Patient has genetic testing done for: The genes analyzed were the 83 genes on [facility]'s Multi-Cancer panel (ALK, APC, ATM, AXIN2, BAP1, BARD1, BLM, BMPR1A, BRCA1, BRCA2, BRIP1, CASR, CDC73, CDH1, CDK4, CDKN1B, CDKN1C, CDKN2A, CEBPA, CHEK2, CTNNA1, DICER1, DIS3L2, EGFR, EPCAM, FH, FLCN, GATA2, GPC3, GREM1, HOXB13, HRAS, KIT, MAX, MEN1, MET, MITF, MLH1, MSH2, MSH3, MSH6, MUTYH, NBN, NF1, NF2, NTHL1, PALB2, PDGFRA, PHOX2B, PMS2, POLD1, POLE, POT1, PRKAR1A, PTCH1, PTEN, RAD50, RAD51C, RAD51D, RB1, RECQL4, RET, RUNX1, SDHA, SDHAF2, SDHB, SDHC, SDHD, SMAD4, SMARCA4, SMARCB1, SMARCE1, STK11, SUFU, TERC, TERT, TMEM127, TP53, TSC1, TSC2, VHL, WRN, WT1). Results revealed patient has no detectable mutation       REVIEW OF SYSTEMS:   Constitutional: Denies fevers, chills or abnormal weight loss Eyes: Denies blurriness of vision Ears, nose, mouth, throat, and face: Denies mucositis or sore throat Respiratory: Denies cough, dyspnea or wheezes Cardiovascular: Denies palpitation, chest discomfort or lower extremity swelling Gastrointestinal:   Denies nausea, heartburn or change in bowel habits Skin: Denies abnormal skin rashes Lymphatics: Denies new lymphadenopathy or easy bruising Neurological:Denies numbness, tingling or new weaknesses Behavioral/Psych: Mood is stable, no new changes  All other systems were reviewed with the patient and are negative.  I have reviewed the past medical history, past surgical history, social history and family history with the patient and they are unchanged from previous note.  ALLERGIES:  is allergic to ace inhibitors; norvasc [amlodipine]; latex; neosporin [neomycin-bacitracin zn-polymyx]; and sulfa antibiotics.  MEDICATIONS:  Current Outpatient Medications  Medication Sig Dispense Refill  . acetaminophen (TYLENOL) 500 MG tablet Take 1,000 mg by mouth every 8 (eight) hours as needed for mild pain or headache.    . albuterol (PROVENTIL HFA;VENTOLIN HFA) 108 (90 BASE) MCG/ACT inhaler Inhale 2 puffs into the lungs every 6 (six) hours as needed for wheezing or shortness of breath. (Patient not taking: Reported on 09/08/2017) 1 Inhaler 1  . aspirin EC 81 MG tablet Take 81 mg by mouth daily.    . Calcium Citrate-Vitamin D ([employer] CALCIUM CITRATE +D PO) Take 2 tablets by mouth every evening.     . Cholecalciferol (D 2000) 2000 units TABS Take 2,000 Units by mouth daily.    . cloNIDine (CATAPRES) 0.1 MG tablet TAKE 1 TABLET BY MOUTH TWICE DAILY 180 tablet 1  . cyanocobalamin (,VITAMIN B-12,) 1000 MCG/ML injection Inject 1,000 mcg into the muscle every Thursday.    . cycloSPORINE (RESTASIS) 0.05 % ophthalmic emulsion Place 1 drop into both eyes 2 (two) times daily.     Marland Kitchen dexamethasone (DECADRON) 4 MG tablet Take 3 tabs at night and 3 tabs at 6 am the morning of chemotherapy, every 3 weeks with food 36 tablet 0  . Famotidine (  PEPCID AC PO) Take by mouth every morning.    . furosemide (LASIX) 40 MG tablet TAKE 1 TABLET BY MOUTH ONCE DAILY 90 tablet 0  . JANUVIA 50 MG tablet TAKE 1 TABLET BY MOUTH ONCE DAILY 90  tablet 0  . levothyroxine (SYNTHROID, LEVOTHROID) 100 MCG tablet TAKE 1 TABLET BY MOUTH ONCE DAILY (Patient taking differently: TAKE 1 TABLET BY MOUTH ONCE DAILY--- takes in am) 90 tablet 2  . lidocaine-prilocaine (EMLA) cream Apply to affected area once 30 g 3  . losartan (COZAAR) 100 MG tablet TAKE 1 TABLET BY MOUTH ONCE DAILY 90 tablet 0  . Magnesium 250 MG TABS Take 250 mg by mouth every evening.     . meclizine (ANTIVERT) 32 MG tablet Take 1 tablet (32 mg total) by mouth 3 (three) times daily as needed for dizziness. 30 tablet 0  . meclizine (ANTIVERT) 50 MG tablet Take 1 tablet (50 mg total) by mouth 2 (two) times daily as needed. (Patient not taking: Reported on 09/18/2017) 30 tablet 0  . metFORMIN (GLUCOPHAGE) 500 MG tablet TAKE 2 TABLETS BY MOUTH ONCE DAILY IN THE MORNING AND TAKE 1 TABLET ONCE DAILY IN THE EVENING (Patient taking differently: Take 500-1,000 mg by mouth 2 (two) times daily with a meal. TAKE 2 TABLETS BY MOUTH ONCE DAILY IN THE MORNING AND TAKE 1 TABLET ONCE DAILY IN THE EVENING) 270 tablet 1  . metoprolol succinate (TOPROL-XL) 50 MG 24 hr tablet TAKE ONE TABLET BY MOUTH TWICE DAILY WITH  OR  IMMEDIATELY  FOLLOWING  A  MEAL 180 tablet 4  . Omega-3 Fatty Acids (FISH OIL) 1000 MG CAPS Take 1,000 mg by mouth every evening.    . ondansetron (ZOFRAN) 8 MG tablet Take 1 tablet (8 mg total) by mouth 2 (two) times daily as needed for refractory nausea / vomiting. Start on day 3 after chemo. 30 tablet 1  . ONETOUCH DELICA LANCETS 15Q MISC USE ONE  TO CHECK GLUCOSE ONCE DAILY 100 each 1  . ONETOUCH VERIO test strip USE TO CHECK GLUCOSE DAILY 100 each 3  . prochlorperazine (COMPAZINE) 10 MG tablet Take 1 tablet (10 mg total) by mouth every 6 (six) hours as needed (Nausea or vomiting). 30 tablet 1  . simvastatin (ZOCOR) 40 MG tablet TAKE 1 TABLET BY MOUTH ONCE DAILY AT 6PM 90 tablet 1  . vitamin C (ASCORBIC ACID) 500 MG tablet Take 500 mg by mouth daily.     Current Facility-Administered  Medications  Medication Dose Route Frequency Provider Last Rate Last Dose  . cyanocobalamin ((VITAMIN B-12)) injection 1,000 mcg  1,000 mcg Intramuscular Daily Evelina Dun A, FNP   1,000 mcg at 08/26/17 0086   Facility-Administered Medications Ordered in Other Visits  Medication Dose Route Frequency Provider Last Rate Last Dose  . sodium chloride flush (NS) 0.9 % injection 10 mL  10 mL Intracatheter PRN Alvy Bimler, Christopher Glasscock, MD   10 mL at 09/23/17 1545    PHYSICAL EXAMINATION: ECOG PERFORMANCE STATUS: 1 - Symptomatic but completely ambulatory  Vitals:   09/23/17 0918  BP: 122/82  Pulse: 75  Resp: 18  Temp: 97.9 F (36.6 C)  SpO2: 97%   Filed Weights   09/23/17 0918  Weight: 163 lb 11.2 oz (74.3 kg)    GENERAL:alert, no distress and comfortable SKIN: Noted skin rash affecting her hands resembling dermatitis EYES: normal, Conjunctiva are pink and non-injected, sclera clear OROPHARYNX:no exudate, no erythema and lips, buccal mucosa, and tongue normal  NECK: supple, thyroid normal  size, non-tender, without nodularity LYMPH:  no palpable lymphadenopathy in the cervical, axillary or inguinal LUNGS: clear to auscultation and percussion with normal breathing effort HEART: regular rate & rhythm and no murmurs and no lower extremity edema ABDOMEN:abdomen soft, non-tender and normal bowel sounds Musculoskeletal:no cyanosis of digits and no clubbing  NEURO: alert & oriented x 3 with fluent speech, no focal motor/sensory deficits  LABORATORY DATA:  I have reviewed the data as listed    Component Value Date/Time   NA 138 09/23/2017 0851   NA 142 08/26/2017 0908   K 4.4 09/23/2017 0851   CL 102 09/23/2017 0851   CO2 25 09/23/2017 0851   GLUCOSE 279 (H) 09/23/2017 0851   BUN 13 09/23/2017 0851   BUN 12 08/26/2017 0908   CREATININE 0.87 09/23/2017 0851   CALCIUM 9.5 09/23/2017 0851   PROT 6.7 09/23/2017 0851   PROT 6.6 08/26/2017 0908   ALBUMIN 3.4 (L) 09/23/2017 0851   ALBUMIN 4.2  08/26/2017 0908   AST 16 09/23/2017 0851   ALT 17 09/23/2017 0851   ALKPHOS 50 09/23/2017 0851   BILITOT 0.4 09/23/2017 0851   GFRNONAA >60 09/23/2017 0851   GFRAA >60 09/23/2017 0851    No results found for: SPEP, UPEP  Lab Results  Component Value Date   WBC 8.7 09/23/2017   NEUTROABS 7.6 (H) 09/23/2017   HGB 13.1 08/27/2017   HCT 33.9 (L) 09/23/2017   MCV 92.1 09/23/2017   PLT 240 09/23/2017      Chemistry      Component Value Date/Time   NA 138 09/23/2017 0851   NA 142 08/26/2017 0908   K 4.4 09/23/2017 0851   CL 102 09/23/2017 0851   CO2 25 09/23/2017 0851   BUN 13 09/23/2017 0851   BUN 12 08/26/2017 0908   CREATININE 0.87 09/23/2017 0851      Component Value Date/Time   CALCIUM 9.5 09/23/2017 0851   ALKPHOS 50 09/23/2017 0851   AST 16 09/23/2017 0851   ALT 17 09/23/2017 0851   BILITOT 0.4 09/23/2017 0851       RADIOGRAPHIC STUDIES: I have personally reviewed the radiological images as listed and agreed with the findings in the report. Ir US Guide Vasc Access Right  Result Date: 08/27/2017 INDICATION: History of metastatic endometrial cancer in need of durable intravenous access for chemotherapy administration. EXAM: IMPLANTED PORT A CATH PLACEMENT WITH ULTRASOUND AND FLUOROSCOPIC GUIDANCE COMPARISON:  Chest CT - 08/14/2017 MEDICATIONS: Ancef 2 gm IV; The antibiotic was administered within an appropriate time interval prior to skin puncture. ANESTHESIA/SEDATION: Moderate (conscious) sedation was employed during this procedure. A total of Versed 1 mg and Fentanyl 50 mcg was administered intravenously. Moderate Sedation Time: 36 minutes. The patient's level of consciousness and vital signs were monitored continuously by radiology nursing throughout the procedure under my direct supervision. CONTRAST:  None FLUOROSCOPY TIME:  2 minutes, 36 seconds (3 mGy) COMPLICATIONS: None immediate. PROCEDURE: The procedure, risks, benefits, and alternatives were explained to the  patient. Questions regarding the procedure were encouraged and answered. The patient understands and consents to the procedure. The right neck and chest were prepped with chlorhexidine in a sterile fashion, and a sterile drape was applied covering the operative field. Maximum barrier sterile technique with sterile gowns and gloves were used for the procedure. A timeout was performed prior to the initiation of the procedure. Local anesthesia was provided with 1% lidocaine with epinephrine. After creating a small venotomy incision, a micropuncture kit was utilized to  access the internal jugular vein. Real-time ultrasound guidance was utilized for vascular access including the acquisition of a permanent ultrasound image documenting patency of the accessed vessel. The microwire was utilized to measure appropriate catheter length. A subcutaneous port pocket was then created along the upper chest wall utilizing a combination of sharp and blunt dissection. The pocket was irrigated with sterile saline. A single lumen ISP power injectable port was chosen for placement. The 8 Fr catheter was tunneled from the port pocket site to the venotomy incision. The port was placed in the pocket. The external catheter was trimmed to appropriate length. At the venotomy, an 8 Fr peel-away sheath was placed over a guidewire under fluoroscopic guidance. The catheter was then placed through the sheath and the sheath was removed. Final catheter positioning was confirmed and documented with a fluoroscopic spot radiograph. The port was accessed with a Huber needle, aspirated and flushed with heparinized saline. The venotomy site was closed with an interrupted 4-0 Vicryl suture. The port pocket incision was closed with interrupted 2-0 Vicryl suture and the skin was opposed with a running subcuticular 4-0 Vicryl suture. Dermabond and Steri-strips were applied to both incisions. Dressings were placed. The patient tolerated the procedure well  without immediate post procedural complication. FINDINGS: After catheter placement, the tip lies within the superior cavoatrial junction. The catheter aspirates and flushes normally and is ready for immediate use. IMPRESSION: Successful placement of a right internal jugular approach power injectable Port-A-Cath. The catheter is ready for immediate use. Electronically Signed   By: Sandi Mariscal M.D.   On: 08/27/2017 13:02   Ir Fluoro Guide Port Insertion Right  Result Date: 08/27/2017 INDICATION: History of metastatic endometrial cancer in need of durable intravenous access for chemotherapy administration. EXAM: IMPLANTED PORT A CATH PLACEMENT WITH ULTRASOUND AND FLUOROSCOPIC GUIDANCE COMPARISON:  Chest CT - 08/14/2017 MEDICATIONS: Ancef 2 gm IV; The antibiotic was administered within an appropriate time interval prior to skin puncture. ANESTHESIA/SEDATION: Moderate (conscious) sedation was employed during this procedure. A total of Versed 1 mg and Fentanyl 50 mcg was administered intravenously. Moderate Sedation Time: 36 minutes. The patient's level of consciousness and vital signs were monitored continuously by radiology nursing throughout the procedure under my direct supervision. CONTRAST:  None FLUOROSCOPY TIME:  2 minutes, 36 seconds (3 mGy) COMPLICATIONS: None immediate. PROCEDURE: The procedure, risks, benefits, and alternatives were explained to the patient. Questions regarding the procedure were encouraged and answered. The patient understands and consents to the procedure. The right neck and chest were prepped with chlorhexidine in a sterile fashion, and a sterile drape was applied covering the operative field. Maximum barrier sterile technique with sterile gowns and gloves were used for the procedure. A timeout was performed prior to the initiation of the procedure. Local anesthesia was provided with 1% lidocaine with epinephrine. After creating a small venotomy incision, a micropuncture kit was  utilized to access the internal jugular vein. Real-time ultrasound guidance was utilized for vascular access including the acquisition of a permanent ultrasound image documenting patency of the accessed vessel. The microwire was utilized to measure appropriate catheter length. A subcutaneous port pocket was then created along the upper chest wall utilizing a combination of sharp and blunt dissection. The pocket was irrigated with sterile saline. A single lumen ISP power injectable port was chosen for placement. The 8 Fr catheter was tunneled from the port pocket site to the venotomy incision. The port was placed in the pocket. The external catheter was trimmed to appropriate length.  At the venotomy, an 8 Fr peel-away sheath was placed over a guidewire under fluoroscopic guidance. The catheter was then placed through the sheath and the sheath was removed. Final catheter positioning was confirmed and documented with a fluoroscopic spot radiograph. The port was accessed with a Huber needle, aspirated and flushed with heparinized saline. The venotomy site was closed with an interrupted 4-0 Vicryl suture. The port pocket incision was closed with interrupted 2-0 Vicryl suture and the skin was opposed with a running subcuticular 4-0 Vicryl suture. Dermabond and Steri-strips were applied to both incisions. Dressings were placed. The patient tolerated the procedure well without immediate post procedural complication. FINDINGS: After catheter placement, the tip lies within the superior cavoatrial junction. The catheter aspirates and flushes normally and is ready for immediate use. IMPRESSION: Successful placement of a right internal jugular approach power injectable Port-A-Cath. The catheter is ready for immediate use. Electronically Signed   By: Sandi Mariscal M.D.   On: 08/27/2017 13:02    All questions were answered. The patient knows to call the clinic with any problems, questions or concerns. No barriers to learning was  detected.  I spent 15 minutes counseling the patient face to face. The total time spent in the appointment was 20 minutes and more than 50% was on counseling and review of test results  Heath Lark, MD 09/23/2017 4:04 PM

## 2017-09-23 NOTE — Patient Instructions (Signed)
[facility] Discharge Instructions for Patients Receiving Chemotherapy  Today you received the following chemotherapy agents:  Taxol, Carboplatin  To help prevent nausea and vomiting after your treatment, we encourage you to take your nausea medication as prescribed.   If you develop nausea and vomiting that is not controlled by your nausea medication, call the clinic.   BELOW ARE SYMPTOMS THAT SHOULD BE REPORTED IMMEDIATELY:  *FEVER GREATER THAN 100.5 F  *CHILLS WITH OR WITHOUT FEVER  NAUSEA AND VOMITING THAT IS NOT CONTROLLED WITH YOUR NAUSEA MEDICATION  *UNUSUAL SHORTNESS OF BREATH  *UNUSUAL BRUISING OR BLEEDING  TENDERNESS IN MOUTH AND THROAT WITH OR WITHOUT PRESENCE OF ULCERS  *URINARY PROBLEMS  *BOWEL PROBLEMS  UNUSUAL RASH Items with * indicate a potential emergency and should be followed up as soon as possible.  Feel free to call the clinic should you have any questions or concerns. The clinic phone number is [phone].  Please show the CHEMO ALERT CARD at check-in to the Emergency Department and triage nurse.   

## 2017-09-23 NOTE — Assessment & Plan Note (Signed)
She has recent skin rash, unlikely due to chemotherapy We discussed avoidance of certain sanitizer that could be causing skin rash on her hands

## 2017-09-23 NOTE — Telephone Encounter (Signed)
Gave avs and calendar ° °

## 2017-09-23 NOTE — Assessment & Plan Note (Signed)
The patient had significant dexamethasone-induced hyperglycemia Plan to reduce premedication dexamethasone for the future and I discussed dietary modification around the time of the chemotherapy

## 2017-09-25 ENCOUNTER — Ambulatory Visit
Admission: RE | Admit: 2017-09-25 | Discharge: 2017-09-25 | Disposition: A | Discharge status: Home / Self Care | Payer: Medicare Other | Source: Ambulatory Visit | Attending: Radiation Oncology | Admitting: Radiation Oncology

## 2017-09-25 ENCOUNTER — Inpatient Hospital Stay: Payer: Medicare Other

## 2017-09-25 VITALS — BP 138/74 | HR 75 | Temp 98.0°F | Resp 16

## 2017-09-25 DIAGNOSIS — C541 Malignant neoplasm of endometrium: Secondary | ICD-10-CM

## 2017-09-25 DIAGNOSIS — Z510 Encounter for antineoplastic radiation therapy: Secondary | ICD-10-CM | POA: Diagnosis not present

## 2017-09-25 DIAGNOSIS — Z5189 Encounter for other specified aftercare: Secondary | ICD-10-CM | POA: Diagnosis not present

## 2017-09-25 DIAGNOSIS — Z5111 Encounter for antineoplastic chemotherapy: Secondary | ICD-10-CM | POA: Diagnosis not present

## 2017-09-25 DIAGNOSIS — E119 Type 2 diabetes mellitus without complications: Secondary | ICD-10-CM | POA: Diagnosis not present

## 2017-09-25 MED ORDER — PEGFILGRASTIM-CBQV 6 MG/0.6ML ~~LOC~~ SOSY
PREFILLED_SYRINGE | SUBCUTANEOUS | Status: AC
  Filled 2017-09-25: qty 0.6

## 2017-09-25 MED ORDER — PEGFILGRASTIM-CBQV 6 MG/0.6ML ~~LOC~~ SOSY
6.0000 mg | PREFILLED_SYRINGE | Freq: Once | SUBCUTANEOUS | Status: AC
  Administered 2017-09-25: 6 mg via SUBCUTANEOUS

## 2017-09-25 NOTE — Progress Notes (Signed)
  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 She will be treated with a 3.0 cm diameter segmented cylinder. This conforms to her anatomy without undue discomfort.  Vaginal brachytherapy procedure node: The patient was brought to the [place] suite. Identity was confirmed. All relevant records and images related to the planned course of therapy were reviewed. The patient freely provided informed written consent to proceed with treatment after reviewing the details related to the planned course of therapy. The consent form was witnessed and verified by the simulation staff. Then, the patient was set-up in a stable reproducible supine position for radiation therapy. Pelvic exam revealed the vaginal cuff to be intact . The patient's custom vaginal cylinder was placed in the proximal vagina. This was affixed to the CT/MR stabilization plate to prevent slippage. Patient tolerated the placement well.  Verification simulation note:  A fiducial marker was placed within the vaginal cylinder. An AP and lateral film was then obtained through the pelvis area. This documented accurate position of the vaginal cylinder for treatment.  HDR BRACHYTHERAPY TREATMENT  The remote afterloading device was affixed to the vaginal cylinder by catheter. Patient then proceeded to undergo her second high-dose-rate treatment directed at the proximal vagina. The patient was prescribed a dose of 6.0 gray to be delivered to the mucosal surface. Treatment length was 3.0 cm. Patient was treated with 1 channel using 7 dwell positions. Treatment time was 364.00 seconds.  Iridium 192 was the high-dose-rate source for treatment. The patient tolerated the treatment well. After completion of her therapy, a radiation survey was performed documenting return of the iridium source into the GammaMed safe.   PLAN: the patient will return next week for her third high-dose-rate treatment. ________________________________  Blair Promise, PhD, MD   This document serves as a record of services personally performed by Gery Pray, MD. It was created on his behalf by [facility], a trained medical scribe. The creation of this record is based on the scribe's personal observations and the provider's statements to them. This document has been checked and approved by the attending provider.

## 2017-09-26 ENCOUNTER — Ambulatory Visit (INDEPENDENT_AMBULATORY_CARE_PROVIDER_SITE_OTHER): Payer: Medicare Other | Admitting: *Deleted

## 2017-09-26 DIAGNOSIS — E538 Deficiency of other specified B group vitamins: Secondary | ICD-10-CM

## 2017-09-26 NOTE — Progress Notes (Signed)
Pt given Cyanocobalamin inj Tolerated well 

## 2017-09-29 ENCOUNTER — Other Ambulatory Visit: Payer: Self-pay | Admitting: *Deleted

## 2017-09-29 MED ORDER — LOSARTAN POTASSIUM 100 MG PO TABS: 100.0000 mg | ORAL_TABLET | Freq: Every day | ORAL | 0 refills | Status: DC

## 2017-10-01 ENCOUNTER — Telehealth: Payer: Self-pay | Admitting: *Deleted

## 2017-10-01 NOTE — Telephone Encounter (Signed)
CALLED PATIENT TO REMIND OF HDR [place] 10-02-17 @ 2 PM, SPOKE WITH PATIENT AND SHE IS AWARE OF THIS [place].

## 2017-10-02 ENCOUNTER — Ambulatory Visit
Admission: RE | Admit: 2017-10-02 | Discharge: 2017-10-02 | Disposition: A | Discharge status: Home / Self Care | Payer: Medicare Other | Source: Ambulatory Visit | Attending: Radiation Oncology | Admitting: Radiation Oncology

## 2017-10-02 DIAGNOSIS — R339 Retention of urine, unspecified: Secondary | ICD-10-CM

## 2017-10-02 DIAGNOSIS — Z510 Encounter for antineoplastic radiation therapy: Secondary | ICD-10-CM | POA: Diagnosis not present

## 2017-10-02 DIAGNOSIS — C541 Malignant neoplasm of endometrium: Secondary | ICD-10-CM | POA: Diagnosis not present

## 2017-10-02 DIAGNOSIS — R300 Dysuria: Secondary | ICD-10-CM

## 2017-10-02 LAB — URINALYSIS, COMPLETE (UACMP) WITH MICROSCOPIC
Bilirubin Urine: NEGATIVE
Glucose, UA: NEGATIVE mg/dL
Hgb urine dipstick: NEGATIVE
KETONES UR: 5 mg/dL — AB
Nitrite: NEGATIVE
Specific Gravity, Urine: 1.022 (ref 1.005–1.030)
pH: 5 (ref 5.0–8.0)

## 2017-10-02 NOTE — Addendum Note (Signed)
Encounter addended by: Jacqulyn Liner, RN on: 10/02/2017 2:52 PM  Actions taken: Visit diagnoses modified, Order list changed, Diagnosis association updated

## 2017-10-02 NOTE — Progress Notes (Addendum)
  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 She will be treated with a 3.0 cm diameter segmented cylinder. This conforms to her anatomy without undue discomfort.  Vaginal brachytherapy procedure node: The patient was brought to the [place] suite. Identity was confirmed. All relevant records and images related to the planned course of therapy were reviewed. The patient freely provided informed written consent to proceed with treatment after reviewing the details related to the planned course of therapy. The consent form was witnessed and verified by the simulation staff. Then, the patient was set-up in a stable reproducible supine position for radiation therapy. Pelvic exam revealed the vaginal cuff to be intact.   The patient's custom vaginal cylinder was placed in the proximal vagina. This was affixed to the CT/MR stabilization plate to prevent slippage. Patient tolerated the placement well.  Verification simulation note:  A fiducial marker was placed within the vaginal cylinder. An AP and lateral film was then obtained through the pelvis area. This documented accurate position of the vaginal cylinder for treatment.  HDR BRACHYTHERAPY TREATMENT  The remote afterloading device was affixed to the vaginal cylinder by catheter. Patient then proceeded to undergo her third high-dose-rate treatment directed at the proximal vagina. The patient was prescribed a dose of 6.0 gray to be delivered to the mucosal surface. Treatment length was 3.0 cm. Patient was treated with 1 channel using 7 dwell positions. Treatment time was 238 seconds.  Iridium 192 was the high-dose-rate source for treatment. The patient tolerated the treatment well. After completion of her therapy, a radiation survey was performed documenting return of the iridium source into the GammaMed safe.   PLAN: Patient will return next week for her fourth high-dose rate treatment.  Patient is complaining of urinary symptoms and will present to the lab later today for urinalysis culture and sensitivity.  She complains she is having difficulty emptying her bladder    ________________________________  Blair Promise, PhD, MD

## 2017-10-02 NOTE — Addendum Note (Signed)
Encounter addended by: Gery Pray, MD on: 10/02/2017 2:40 PM  Actions taken: Medication List reviewed, Allergies reviewed, Problem List reviewed, Visit diagnoses modified, Sign clinical note

## 2017-10-03 ENCOUNTER — Other Ambulatory Visit: Payer: Self-pay | Admitting: Radiation Oncology

## 2017-10-03 ENCOUNTER — Telehealth: Payer: Self-pay | Admitting: Radiation Oncology

## 2017-10-03 DIAGNOSIS — C801 Malignant (primary) neoplasm, unspecified: Secondary | ICD-10-CM

## 2017-10-03 DIAGNOSIS — R319 Hematuria, unspecified: Secondary | ICD-10-CM

## 2017-10-03 DIAGNOSIS — N3001 Acute cystitis with hematuria: Secondary | ICD-10-CM

## 2017-10-03 DIAGNOSIS — N3090 Cystitis, unspecified without hematuria: Secondary | ICD-10-CM

## 2017-10-03 MED ORDER — NITROFURANTOIN MONOHYD MACRO 100 MG PO CAPS: 100.0000 mg | ORAL_CAPSULE | Freq: Two times a day (BID) | ORAL | 0 refills | Status: DC

## 2017-10-03 NOTE — Telephone Encounter (Signed)
Phoned patient as requested by Dr. Isidore Moos and explained an antibiotic has been called into her [facility] in [place]. She verbalized understanding and expressed appreciation for the assistance.

## 2017-10-03 NOTE — Progress Notes (Signed)
ICD-10-CM   1. Cystitis N30.90 nitrofurantoin, macrocrystal-monohydrate, (MACROBID) 100 MG capsule  2. Hematuria, unspecified type R31.9   3. Acute cystitis with hematuria N30.01   4. Cancer (HCC) C80.1 nitrofurantoin, macrocrystal-monohydrate, (MACROBID) 100 MG capsule   Urine culture and chart reviewed in Dr Clabe Seal absence.  Nitrofurantoin Rx'd due to UTI.  Nursing will inform patient to pick up at [facility]. Dr. Sondra Come asked to check pending sensitivities when they return.  -----------------------------------  Eppie Gibson, MD

## 2017-10-04 LAB — URINE CULTURE: Culture: >=100000 — AB

## 2017-10-06 ENCOUNTER — Ambulatory Visit
Admission: RE | Admit: 2017-10-06 | Discharge: 2017-10-06 | Disposition: A | Discharge status: Home / Self Care | Payer: Medicare Other | Source: Ambulatory Visit | Attending: Radiation Oncology | Admitting: Radiation Oncology

## 2017-10-06 ENCOUNTER — Other Ambulatory Visit: Payer: Self-pay | Admitting: Family

## 2017-10-06 DIAGNOSIS — C541 Malignant neoplasm of endometrium: Secondary | ICD-10-CM

## 2017-10-06 DIAGNOSIS — Z510 Encounter for antineoplastic radiation therapy: Secondary | ICD-10-CM | POA: Diagnosis not present

## 2017-10-06 NOTE — Progress Notes (Signed)
  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 She will be treated with a 3.0 cm diameter segmented cylinder. This conforms to her anatomy without undue discomfort.  Vaginal brachytherapy procedure node: The patient was brought to the [place] suite. Identity was confirmed. All relevant records and images related to the planned course of therapy were reviewed. The patient freely provided informed written consent to proceed with treatment after reviewing the details related to the planned course of therapy. The consent form was witnessed and verified by the simulation staff. Then, the patient was set-up in a stable reproducible supine position for radiation therapy. Pelvic exam revealed the vaginal cuff to be intact.   The patient's custom vaginal cylinder was placed in the proximal vagina. This was affixed to the CT/MR stabilization plate to prevent slippage. Patient tolerated the placement well.  Verification simulation note:  A fiducial marker was placed within the vaginal cylinder. An AP and lateral film was then obtained through the pelvis area. This documented accurate position of the vaginal cylinder for treatment.  HDR BRACHYTHERAPY TREATMENT  The remote afterloading device was affixed to the vaginal cylinder by catheter. Patient then proceeded to undergo her fourth high-dose-rate treatment directed at the proximal vagina. The patient was prescribed a dose of 6.0 gray to be delivered to the mucosal surface. Treatment length was 3.0 cm. Patient was treated with 1 channel using 7 dwell positions. Treatment time was 164.0  seconds. Iridium 192 was the high-dose-rate source for treatment. The patient tolerated the treatment well. After completion of her therapy, a radiation survey was performed documenting return of the iridium source into the GammaMed safe.   PLAN: Patient will return next week for her fifth high-dose rate treatment. Bladder symptoms better with antibiotics.  ________________________________  Blair Promise, PhD, MD   This document serves as a record of services personally performed by Gery Pray, MD. It was created on his behalf by Bethann Humble, a trained medical scribe. The creation of this record is based on the scribe's personal observations and the provider's statements to them. This document has been checked and approved by the attending provider.

## 2017-10-09 ENCOUNTER — Ambulatory Visit: Payer: Medicare Other

## 2017-10-09 ENCOUNTER — Telehealth: Payer: Self-pay | Admitting: Oncology

## 2017-10-09 DIAGNOSIS — N3090 Cystitis, unspecified without hematuria: Secondary | ICD-10-CM

## 2017-10-09 DIAGNOSIS — C801 Malignant (primary) neoplasm, unspecified: Secondary | ICD-10-CM

## 2017-10-09 MED ORDER — NITROFURANTOIN MONOHYD MACRO 100 MG PO CAPS: 100.0000 mg | ORAL_CAPSULE | Freq: Two times a day (BID) | ORAL | 0 refills | Status: DC

## 2017-10-09 NOTE — Addendum Note (Signed)
Addended by: Elmo Putt R on: 10/09/2017 10:32 AM   Modules accepted: Orders

## 2017-10-09 NOTE — Telephone Encounter (Addendum)
Patient called and said she was given a 5 day supply of nitrofurantoin by Dr. Isidore Moos for a UTI.  She said she usually gets a 10 day supply and would like a refill sent to the [facility] in [place].  She said she is still having dysuria and urgency and is only urinating small amounts.

## 2017-10-09 NOTE — Telephone Encounter (Signed)
Refill entered for nitrofurantoin for 5 days per Ashlyn Brunning, PA-C.  Called patient and notified her that the refill has been sent to [facility] in [place].

## 2017-10-10 ENCOUNTER — Telehealth: Payer: Self-pay | Admitting: *Deleted

## 2017-10-10 NOTE — Telephone Encounter (Signed)
Called patient to remind of HDR [place]. for 10-13-17 @ 2 pm, lvm for a return call

## 2017-10-13 ENCOUNTER — Ambulatory Visit
Admission: RE | Admit: 2017-10-13 | Discharge: 2017-10-13 | Disposition: A | Discharge status: Home / Self Care | Payer: Medicare Other | Source: Ambulatory Visit | Attending: Radiation Oncology | Admitting: Radiation Oncology

## 2017-10-13 ENCOUNTER — Encounter: Payer: Self-pay | Admitting: Radiation Oncology

## 2017-10-13 DIAGNOSIS — C541 Malignant neoplasm of endometrium: Secondary | ICD-10-CM | POA: Diagnosis not present

## 2017-10-13 DIAGNOSIS — Z510 Encounter for antineoplastic radiation therapy: Secondary | ICD-10-CM | POA: Diagnosis not present

## 2017-10-13 NOTE — Progress Notes (Signed)
  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 She will be treated with a 3.0 cm diameter segmented cylinder. This conforms to her anatomy without undue discomfort.  Vaginal brachytherapy procedure node: The patient was brought to the [place] suite. Identity was confirmed. All relevant records and images related to the planned course of therapy were reviewed. The patient freely provided informed written consent to proceed with treatment after reviewing the details related to the planned course of therapy. The consent form was witnessed and verified by the simulation staff. Then, the patient was set-up in a stable reproducible supine position for radiation therapy. Pelvic exam revealed the vaginal cuff to be intact.   The patient's custom vaginal cylinder was placed in the proximal vagina. This was affixed to the CT/MR stabilization plate to prevent slippage. Patient tolerated the placement well.  Verification simulation note:  A fiducial marker was placed within the vaginal cylinder. An AP and lateral film was then obtained through the pelvis area. This documented accurate position of the vaginal cylinder for treatment.  HDR BRACHYTHERAPY TREATMENT  The remote afterloading device was affixed to the vaginal cylinder by catheter. Patient then proceeded to undergo her fifth high-dose-rate treatment directed at the proximal vagina. The patient was prescribed a dose of 6.0 gray to be delivered to the mucosal surface. Treatment length was 3.0 cm. Patient was treated with 1 channel using 7 dwell positions. Treatment time was 175.1  seconds. Iridium 192 was the high-dose-rate source for treatment. The patient tolerated the treatment well. After completion of her therapy, a radiation survey was performed documenting return of the iridium source into the GammaMed safe.   PLAN: Patient will return for routine follow-up in one month.  ________________________________  Blair Promise, PhD, MD   This document serves as a record of services personally performed by Gery Pray, MD. It was created on his behalf by Bethann Humble, a trained medical scribe. The creation of this record is based on the scribe's personal observations and the provider's statements to them. This document has been checked and approved by the attending provider.

## 2017-10-13 NOTE — Progress Notes (Signed)
  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 The patient was treated with a 3 cm diameter segmented cylinder with a 3 cm treatment length.  She did not experience any significant side effects.  Plan: The patient has completed radiation treatment. The patient will return to radiation oncology clinic for routine followup in one month. I advised them to call or return sooner if they have any questions or concerns related to their recovery or treatment.  -----------------------------------  Blair Promise, PhD, MD  This document serves as a record of services personally performed by Gery Pray, MD. It was created on his behalf by Bethann Humble, a trained medical scribe. The creation of this record is based on the scribe's personal observations and the provider's statements to them. This document has been checked and approved by the attending provider.

## 2017-10-14 ENCOUNTER — Inpatient Hospital Stay: Payer: Medicare Other

## 2017-10-14 ENCOUNTER — Encounter: Payer: Self-pay | Admitting: Hematology and Oncology

## 2017-10-14 ENCOUNTER — Inpatient Hospital Stay (HOSPITAL_BASED_OUTPATIENT_CLINIC_OR_DEPARTMENT_OTHER): Payer: Medicare Other | Admitting: Hematology and Oncology

## 2017-10-14 ENCOUNTER — Inpatient Hospital Stay: Payer: Medicare Other | Attending: Hematology and Oncology

## 2017-10-14 DIAGNOSIS — Z90710 Acquired absence of both cervix and uterus: Secondary | ICD-10-CM | POA: Diagnosis not present

## 2017-10-14 DIAGNOSIS — Z5189 Encounter for other specified aftercare: Secondary | ICD-10-CM | POA: Insufficient documentation

## 2017-10-14 DIAGNOSIS — C541 Malignant neoplasm of endometrium: Secondary | ICD-10-CM

## 2017-10-14 DIAGNOSIS — T451X5A Adverse effect of antineoplastic and immunosuppressive drugs, initial encounter: Secondary | ICD-10-CM | POA: Diagnosis not present

## 2017-10-14 DIAGNOSIS — Z5111 Encounter for antineoplastic chemotherapy: Secondary | ICD-10-CM | POA: Insufficient documentation

## 2017-10-14 DIAGNOSIS — G620 Drug-induced polyneuropathy: Secondary | ICD-10-CM

## 2017-10-14 DIAGNOSIS — E119 Type 2 diabetes mellitus without complications: Secondary | ICD-10-CM | POA: Diagnosis not present

## 2017-10-14 LAB — CMP (CANCER CENTER ONLY)
ALT: 21 U/L (ref 0–55)
AST: 19 U/L (ref 5–34)
Albumin: 3.7 g/dL (ref 3.5–5.0)
Alkaline Phosphatase: 58 U/L (ref 40–150)
Anion gap: 10 (ref 3–11)
BUN: 15 mg/dL (ref 7–26)
CHLORIDE: 102 mmol/L (ref 98–109)
CO2: 27 mmol/L (ref 22–29)
Calcium: 9.8 mg/dL (ref 8.4–10.4)
Creatinine: 0.85 mg/dL (ref 0.60–1.10)
Glucose, Bld: 172 mg/dL — ABNORMAL HIGH (ref 70–140)
POTASSIUM: 4.2 mmol/L (ref 3.5–5.1)
SODIUM: 139 mmol/L (ref 136–145)
Total Bilirubin: 0.6 mg/dL (ref 0.2–1.2)
Total Protein: 6.9 g/dL (ref 6.4–8.3)

## 2017-10-14 LAB — CBC WITH DIFFERENTIAL (CANCER CENTER ONLY)
BASOS ABS: 0 10*3/uL (ref 0.0–0.1)
Basophils Relative: 0 %
EOS ABS: 0 10*3/uL (ref 0.0–0.5)
EOS PCT: 0 %
HCT: 32.1 % — ABNORMAL LOW (ref 34.8–46.6)
Hemoglobin: 11.1 g/dL — ABNORMAL LOW (ref 11.6–15.9)
LYMPHS PCT: 10 %
Lymphs Abs: 1 10*3/uL (ref 0.9–3.3)
MCH: 31.9 pg (ref 25.1–34.0)
MCHC: 34.6 g/dL (ref 31.5–36.0)
MCV: 92.3 fL (ref 79.5–101.0)
MONO ABS: 0.1 10*3/uL (ref 0.1–0.9)
Monocytes Relative: 1 %
Neutro Abs: 8.5 10*3/uL — ABNORMAL HIGH (ref 1.5–6.5)
Neutrophils Relative %: 89 %
PLATELETS: 167 10*3/uL (ref 145–400)
RBC: 3.48 MIL/uL — ABNORMAL LOW (ref 3.70–5.45)
RDW: 16.6 % — AB (ref 11.2–14.5)
WBC Count: 9.6 10*3/uL (ref 3.9–10.3)

## 2017-10-14 MED ORDER — SODIUM CHLORIDE 0.9% FLUSH
10.0000 mL | INTRAVENOUS | Status: DC | PRN
  Administered 2017-10-14: 10 mL
  Filled 2017-10-14: qty 10

## 2017-10-14 MED ORDER — SODIUM CHLORIDE 0.9 % IV SOLN
Freq: Once | INTRAVENOUS | Status: AC
  Administered 2017-10-14: 11:00:00 via INTRAVENOUS
  Filled 2017-10-14: qty 5

## 2017-10-14 MED ORDER — DIPHENHYDRAMINE HCL 50 MG/ML IJ SOLN
INTRAMUSCULAR | Status: AC
  Filled 2017-10-14: qty 1

## 2017-10-14 MED ORDER — FAMOTIDINE IN NACL 20-0.9 MG/50ML-% IV SOLN
20.0000 mg | Freq: Once | INTRAVENOUS | Status: AC
  Administered 2017-10-14: 20 mg via INTRAVENOUS

## 2017-10-14 MED ORDER — HEPARIN SOD (PORK) LOCK FLUSH 100 UNIT/ML IV SOLN
500.0000 [IU] | Freq: Once | INTRAVENOUS | Status: AC | PRN
  Administered 2017-10-14: 500 [IU]
  Filled 2017-10-14: qty 5

## 2017-10-14 MED ORDER — PALONOSETRON HCL INJECTION 0.25 MG/5ML
INTRAVENOUS | Status: AC
  Filled 2017-10-14: qty 5

## 2017-10-14 MED ORDER — PALONOSETRON HCL INJECTION 0.25 MG/5ML
0.2500 mg | Freq: Once | INTRAVENOUS | Status: AC
  Administered 2017-10-14: 0.25 mg via INTRAVENOUS

## 2017-10-14 MED ORDER — SODIUM CHLORIDE 0.9% FLUSH
10.0000 mL | Freq: Once | INTRAVENOUS | Status: AC
  Administered 2017-10-14: 10 mL
  Filled 2017-10-14: qty 10

## 2017-10-14 MED ORDER — SODIUM CHLORIDE 0.9 % IV SOLN
Freq: Once | INTRAVENOUS | Status: AC
  Administered 2017-10-14: 11:00:00 via INTRAVENOUS

## 2017-10-14 MED ORDER — FAMOTIDINE IN NACL 20-0.9 MG/50ML-% IV SOLN
INTRAVENOUS | Status: AC
  Filled 2017-10-14: qty 50

## 2017-10-14 MED ORDER — DIPHENHYDRAMINE HCL 50 MG/ML IJ SOLN
50.0000 mg | Freq: Once | INTRAMUSCULAR | Status: AC
  Administered 2017-10-14: 50 mg via INTRAVENOUS

## 2017-10-14 MED ORDER — SODIUM CHLORIDE 0.9 % IV SOLN
175.0000 mg/m2 | Freq: Once | INTRAVENOUS | Status: AC
  Administered 2017-10-14: 324 mg via INTRAVENOUS
  Filled 2017-10-14: qty 54

## 2017-10-14 MED ORDER — SODIUM CHLORIDE 0.9 % IV SOLN
520.0000 mg | Freq: Once | INTRAVENOUS | Status: AC
  Administered 2017-10-14: 520 mg via INTRAVENOUS
  Filled 2017-10-14: qty 52

## 2017-10-14 NOTE — Patient Instructions (Signed)
[facility] Discharge Instructions for Patients Receiving Chemotherapy  Today you received the following chemotherapy agents: Paclitaxel, Carboplatin  To help prevent nausea and vomiting after your treatment, we encourage you to take your nausea medication as directed.   If you develop nausea and vomiting that is not controlled by your nausea medication, call the clinic.   BELOW ARE SYMPTOMS THAT SHOULD BE REPORTED IMMEDIATELY:  *FEVER GREATER THAN 100.5 F  *CHILLS WITH OR WITHOUT FEVER  NAUSEA AND VOMITING THAT IS NOT CONTROLLED WITH YOUR NAUSEA MEDICATION  *UNUSUAL SHORTNESS OF BREATH  *UNUSUAL BRUISING OR BLEEDING  TENDERNESS IN MOUTH AND THROAT WITH OR WITHOUT PRESENCE OF ULCERS  *URINARY PROBLEMS  *BOWEL PROBLEMS  UNUSUAL RASH Items with * indicate a potential emergency and should be followed up as soon as possible.  Feel free to call the clinic should you have any questions or concerns. The clinic phone number is [phone].  Please show the CHEMO ALERT CARD at check-in to the Emergency Department and triage nurse.   

## 2017-10-14 NOTE — Assessment & Plan Note (Addendum)
she has mild peripheral neuropathy, likely related to side effects of treatment. It is only mild, not bothering the patient. I will observe for now If it gets worse in the future, I will consider modifying the dose of the treatment  

## 2017-10-14 NOTE — Assessment & Plan Note (Signed)
She tolerated treatment well except for mild peripheral neuropathy and hyperglycemia I recommend continue on reduced premedication dexamethasone for future cycle We would proceed with treatment without dose adjustment

## 2017-10-14 NOTE — Assessment & Plan Note (Signed)
The patient had significant dexamethasone-induced hyperglycemia I recommend continue on reduced dose premedication dexamethasone and I discussed dietary modification around the time of the chemotherapy

## 2017-10-14 NOTE — Progress Notes (Signed)
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 It is only mild, not bothering the patient. I will observe for now If it gets worse in the future, I will consider modifying the dose of the treatment  Diabetes [facility]) The patient had significant dexamethasone-induced hyperglycemia I recommend continue on reduced dose premedication dexamethasone and I discussed dietary modification around the time of the chemotherapy   No orders of the defined types were placed in this encounter.   INTERVAL HISTORY: Please see below for problem oriented charting. She returns for further follow-up and chemotherapy She was treated with UTI recently but that has resolved She complained of mild intermittent numbness and tingling sensation in her toes but not persistent She denies recent nausea or constipation from treatment Overall, she tolerated treatment fairly well  SUMMARY OF ONCOLOGIC HISTORY: Oncology History   endometroid MSI by IHC showed loss of nuclear expression, BRAF neg. Neg genetic testing     Endometrial adenocarcinoma ([place])   06/13/2017 Procedure    PROCEDURE: Hysteroscopy, D&C  SURGEON: Richard D. Deatra Ina M.D.  FINDINGS: Hypertrophic endometrial tissue, no polyps, no myoma         06/13/2017 Pathology Results    Endometrium, curettage ENDOMETRIOID ADENOCARCINOMA, FIGO GRADE 1      08/05/2017 Pathology Results    1. Lymph node, sentinel, biopsy, right external iliac - NO CARCINOMA IDENTIFIED IN ONE  LYMPH NODE (0/1) - SEE COMMENT 2. Lymph node, sentinel, biopsy, left external iliac #1 - NO CARCINOMA IDENTIFIED IN ONE LYMPH NODE (0/1) - SEE COMMENT 3. Lymph node, sentinel, biopsy, left external iliac #2 - NO CARCINOMA IDENTIFIED IN ONE LYMPH NODE (0/1) - SEE COMMENT 4. Uterus +/- tubes/ovaries, neoplastic, ovaries UTERUS: - ENDOMETRIOID CARCINOMA, FIGO GRADE 2, INVOLVING THE ENTIRE POSTERIOR MYOMETRIUM WITH TUMOR PRESENT AT INKED SEROSAL SURFACE - SEE ONCOLOGY TABLE BELOW CERVIX: - NABOTHIAN CYSTS - NO DYSPLASIA OR MALIGNANCY IDENTIFIED BILATERAL OVARIES: - NO CARCINOMA IDENTIFIED BILATERAL FALLOPIAN TUBES: - ENDOMETRIOID CARCINOMA INVOLVING LEFT FALLOPIAN TUBE  1. , 2 and 3. Cytokeratin AE1/3 was performed on the sentinel lymph nodes to exclude micrometastasis; they are negative. 4. ONCOLOGY TABLE-UTERUS, CARCINOMA OR CARCINOSARCOMA Specimen: Uterus, bilateral fallopian tubes and ovaries Procedure: Total hysterectomy and bilateral salpingo-oophorectomy Histologic type: Endometrioid carcinoma, NOS Grade: FIGO Grade 2 Myometrial invasion: >50% (The posterior myometrium has full thickness involvement with tumor extending to the inked serosal surface) Uterine Serosa involvement: Present Cervical stromal involvement: Not identified Other Tissue/Organ Involvement: Left fallopian tube Lymphovascular invasion: Not identified Peritoneal washings: Not submitted/unknown Lymph nodes: Examined: 3 Sentinel 0 Non-sentinel 3 Total Lymph nodes with metastasis: 0 Isolated tumor cells (< 0.2 mm): 0 Micrometastasis: (> 0.2 mm and < 2.0 mm): 0 Macrometastasis: (> 2.0 mm): 0 Extracapsular extension: N/A TNM code: pT3a, pN0 FIGO Stage (based on pathologic findings, needs clinical correlation): IIIa *Block 4I is representative of the tumor and suitable for additional studies, if requested. COMMENT: MMR (by immunohistochemistry) is pending and will be reported in an addendum.       08/05/2017 Surgery    Operation: Robotic-assisted laparoscopic total hysterectomy with bilateral salpingoophorectomy, SLN biopsy,  umbilical hernia  Surgeon: Donaciano Eva  Operative Findings:  : 6cm normal appearing uterus, normal right tube and ovary, left ovary with cystic mass, left sigmoid colon diverticular disease, adhesions to left ovary.  No gross extrauterine disease.         08/14/2017 Imaging    1. Interval hysterectomy. No findings of recurrent or metastatic disease. 2. Small volume pelvic fluid and a presumed postoperative seroma or lymphangioma within the left hemipelvis. 3. Subtle irregular hepatic capsule is suspicious for mild cirrhosis. Correlate with risk factors. 4. Coronary artery atherosclerosis. Aortic Atherosclerosis (ICD10-I70.0).      08/27/2017 Procedure    Successful placement of a right internal jugular approach power injectable Port-A-Cath. The catheter is ready for immediate use.      09/02/2017 -  Chemotherapy    The patient had carboplatin and Taxol      09/11/2017 Genetic Testing    Patient has genetic testing done for: The genes analyzed were the 83 genes on [employer]'s Multi-Cancer panel (ALK, APC, ATM, AXIN2, BAP1, BARD1, BLM, BMPR1A, BRCA1, BRCA2, BRIP1, CASR, CDC73, CDH1, CDK4, CDKN1B, CDKN1C, CDKN2A, CEBPA, CHEK2, CTNNA1, DICER1, DIS3L2, EGFR, EPCAM, FH, FLCN, GATA2, GPC3, GREM1, HOXB13, HRAS, KIT, MAX, MEN1, MET, MITF, MLH1, MSH2, MSH3, MSH6, MUTYH, NBN, NF1, NF2, NTHL1, PALB2, PDGFRA, PHOX2B, PMS2, POLD1, POLE, POT1, PRKAR1A, PTCH1, PTEN, RAD50, RAD51C, RAD51D, RB1, RECQL4, RET, RUNX1, SDHA, SDHAF2, SDHB, SDHC, SDHD, SMAD4, SMARCA4, SMARCB1, SMARCE1, STK11, SUFU, TERC, TERT, TMEM127, TP53, TSC1, TSC2, VHL, WRN, WT1). Results revealed patient has no detectable mutation       REVIEW OF SYSTEMS:   Constitutional: Denies fevers, chills or abnormal weight loss Eyes: Denies blurriness of vision Ears, nose, mouth, throat, and face: Denies  mucositis or sore throat Respiratory: Denies cough, dyspnea or wheezes Cardiovascular: Denies palpitation, chest discomfort or lower extremity swelling Gastrointestinal:  Denies nausea, heartburn or change in bowel habits Skin: Denies abnormal skin rashes Lymphatics: Denies new lymphadenopathy or easy bruising Neurological:Denies numbness, tingling or new weaknesses Behavioral/Psych: Mood is stable, no new changes  All other systems were reviewed with the patient and are negative.  I have reviewed the past medical history, past surgical history, social history and family history with the patient and they are unchanged from previous note.  ALLERGIES:  is allergic to ace inhibitors; norvasc [amlodipine]; latex; neosporin [neomycin-bacitracin zn-polymyx]; and sulfa antibiotics.  MEDICATIONS:  Current Outpatient Medications  Medication Sig Dispense Refill  . acetaminophen (TYLENOL) 500 MG tablet Take 1,000 mg by mouth every 8 (eight) hours as needed for mild pain or headache.    . albuterol (PROVENTIL HFA;VENTOLIN HFA) 108 (90 BASE) MCG/ACT inhaler Inhale 2 puffs into the lungs every 6 (six) hours as needed for wheezing or shortness of breath. (Patient not taking: Reported on 09/08/2017) 1 Inhaler 1  . aspirin EC 81 MG tablet Take 81 mg by mouth daily.    . Calcium Citrate-Vitamin D ([employer] CALCIUM CITRATE +D PO) Take 2 tablets by mouth every evening.     . Cholecalciferol (D 2000) 2000 units TABS Take 2,000 Units by mouth daily.    . cloNIDine (CATAPRES) 0.1 MG tablet TAKE 1 TABLET BY MOUTH TWICE DAILY 180 tablet 1  . cyanocobalamin (,VITAMIN B-12,) 1000 MCG/ML injection Inject 1,000 mcg into the muscle every Thursday.    . cycloSPORINE (RESTASIS) 0.05 % ophthalmic emulsion Place 1 drop into both eyes 2 (two) times daily.     Marland Kitchen dexamethasone (DECADRON) 4 MG tablet Take 3 tabs at night and 3  tabs at 6 am the morning of chemotherapy, every 3 weeks with food 36 tablet 0  . Famotidine (PEPCID AC PO)  Take by mouth every morning.    . furosemide (LASIX) 40 MG tablet TAKE 1 TABLET BY MOUTH ONCE DAILY 90 tablet 0  . JANUVIA 50 MG tablet TAKE 1 TABLET BY MOUTH ONCE DAILY 90 tablet 0  . levothyroxine (SYNTHROID, LEVOTHROID) 100 MCG tablet TAKE 1 TABLET BY MOUTH ONCE DAILY 90 tablet 2  . lidocaine-prilocaine (EMLA) cream Apply to affected area once 30 g 3  . losartan (COZAAR) 100 MG tablet Take 1 tablet (100 mg total) by mouth daily. 90 tablet 0  . Magnesium 250 MG TABS Take 250 mg by mouth every evening.     . meclizine (ANTIVERT) 32 MG tablet Take 1 tablet (32 mg total) by mouth 3 (three) times daily as needed for dizziness. 30 tablet 0  . meclizine (ANTIVERT) 50 MG tablet Take 1 tablet (50 mg total) by mouth 2 (two) times daily as needed. (Patient not taking: Reported on 09/18/2017) 30 tablet 0  . metFORMIN (GLUCOPHAGE) 500 MG tablet TAKE 2 TABLETS BY MOUTH ONCE DAILY IN THE MORNING AND TAKE 1 TABLET ONCE DAILY IN THE EVENING (Patient taking differently: Take 500-1,000 mg by mouth 2 (two) times daily with a meal. TAKE 2 TABLETS BY MOUTH ONCE DAILY IN THE MORNING AND TAKE 1 TABLET ONCE DAILY IN THE EVENING) 270 tablet 1  . metoprolol succinate (TOPROL-XL) 50 MG 24 hr tablet TAKE ONE TABLET BY MOUTH TWICE DAILY WITH  OR  IMMEDIATELY  FOLLOWING  A  MEAL 180 tablet 4  . nitrofurantoin, macrocrystal-monohydrate, (MACROBID) 100 MG capsule Take 1 capsule (100 mg total) by mouth 2 (two) times daily. Take for 5 days. 10 capsule 0  . Omega-3 Fatty Acids (FISH OIL) 1000 MG CAPS Take 1,000 mg by mouth every evening.    . ondansetron (ZOFRAN) 8 MG tablet Take 1 tablet (8 mg total) by mouth 2 (two) times daily as needed for refractory nausea / vomiting. Start on day 3 after chemo. 30 tablet 1  . ONETOUCH DELICA LANCETS 28M MISC USE 1 TO CHECK GLUCOSE ONCE DAILY 100 each 1  . ONETOUCH VERIO test strip USE TO CHECK GLUCOSE DAILY 100 each 3  . prochlorperazine (COMPAZINE) 10 MG tablet Take 1 tablet (10 mg total) by  mouth every 6 (six) hours as needed (Nausea or vomiting). 30 tablet 1  . simvastatin (ZOCOR) 40 MG tablet TAKE 1 TABLET BY MOUTH ONCE DAILY AT 6PM 90 tablet 1  . vitamin C (ASCORBIC ACID) 500 MG tablet Take 500 mg by mouth daily.     Current Facility-Administered Medications  Medication Dose Route Frequency Provider Last Rate Last Dose  . cyanocobalamin ((VITAMIN B-12)) injection 1,000 mcg  1,000 mcg Intramuscular Daily Evelina Dun A, FNP   1,000 mcg at 09/26/17 3817   Facility-Administered Medications Ordered in Other Visits  Medication Dose Route Frequency Provider Last Rate Last Dose  . 0.9 %  sodium chloride infusion   Intravenous Once Alvy Bimler, Snyder Colavito, MD      . CARBOplatin (PARAPLATIN) 520 mg in sodium chloride 0.9 % 250 mL chemo infusion  520 mg Intravenous Once Alvy Bimler, Tyrece Vanterpool, MD      . diphenhydrAMINE (BENADRYL) injection 50 mg  50 mg Intravenous Once Alvy Bimler, Undra Harriman, MD      . famotidine (PEPCID) IVPB 20 mg premix  20 mg Intravenous Once Heath Lark, MD      . fosaprepitant (  EMEND) 150 mg, dexamethasone (DECADRON) 12 mg in sodium chloride 0.9 % 145 mL IVPB   Intravenous Once Nicklous Aburto, MD      . heparin lock flush 100 unit/mL  500 Units Intracatheter Once PRN Alvy Bimler, Jocee Kissick, MD      . PACLitaxel (TAXOL) 324 mg in sodium chloride 0.9 % 500 mL chemo infusion (> 25m/m2)  175 mg/m2 (Treatment Plan Recorded) Intravenous Once GAlvy Bimler Ailee Pates, MD      . palonosetron (ALOXI) injection 0.25 mg  0.25 mg Intravenous Once Grady Mohabir, MD      . sodium chloride flush (NS) 0.9 % injection 10 mL  10 mL Intracatheter PRN GAlvy Bimler Grayer Sproles, MD        PHYSICAL EXAMINATION: ECOG PERFORMANCE STATUS: 1 - Symptomatic but completely ambulatory  Vitals:   10/14/17 0919  BP: 118/64  Pulse: 71  Resp: 18  Temp: 97.7 F (36.5 C)  SpO2: 98%   Filed Weights   10/14/17 0919  Weight: 159 lb 8 oz (72.3 kg)    GENERAL:alert, no distress and comfortable SKIN: skin color, texture, turgor are normal, no rashes or  significant lesions EYES: normal, Conjunctiva are pink and non-injected, sclera clear OROPHARYNX:no exudate, no erythema and lips, buccal mucosa, and tongue normal  NECK: supple, thyroid normal size, non-tender, without nodularity LYMPH:  no palpable lymphadenopathy in the cervical, axillary or inguinal LUNGS: clear to auscultation and percussion with normal breathing effort HEART: regular rate & rhythm and no murmurs and no lower extremity edema ABDOMEN:abdomen soft, non-tender and normal bowel sounds Musculoskeletal:no cyanosis of digits and no clubbing  NEURO: alert & oriented x 3 with fluent speech, no focal motor/sensory deficits  LABORATORY DATA:  I have reviewed the data as listed    Component Value Date/Time   NA 139 10/14/2017 0843   NA 142 08/26/2017 0908   K 4.2 10/14/2017 0843   CL 102 10/14/2017 0843   CO2 27 10/14/2017 0843   GLUCOSE 172 (H) 10/14/2017 0843   BUN 15 10/14/2017 0843   BUN 12 08/26/2017 0908   CREATININE 0.85 10/14/2017 0843   CALCIUM 9.8 10/14/2017 0843   PROT 6.9 10/14/2017 0843   PROT 6.6 08/26/2017 0908   ALBUMIN 3.7 10/14/2017 0843   ALBUMIN 4.2 08/26/2017 0908   AST 19 10/14/2017 0843   ALT 21 10/14/2017 0843   ALKPHOS 58 10/14/2017 0843   BILITOT 0.6 10/14/2017 0843   GFRNONAA >60 10/14/2017 0843   GFRAA >60 10/14/2017 0843    No results found for: SPEP, UPEP  Lab Results  Component Value Date   WBC 9.6 10/14/2017   NEUTROABS 8.5 (H) 10/14/2017   HGB 13.1 08/27/2017   HCT 32.1 (L) 10/14/2017   MCV 92.3 10/14/2017   PLT 167 10/14/2017      Chemistry      Component Value Date/Time   NA 139 10/14/2017 0843   NA 142 08/26/2017 0908   K 4.2 10/14/2017 0843   CL 102 10/14/2017 0843   CO2 27 10/14/2017 0843   BUN 15 10/14/2017 0843   BUN 12 08/26/2017 0908   CREATININE 0.85 10/14/2017 0843      Component Value Date/Time   CALCIUM 9.8 10/14/2017 0843   ALKPHOS 58 10/14/2017 0843   AST 19 10/14/2017 0843   ALT 21 10/14/2017  0843   BILITOT 0.6 10/14/2017 0843       All questions were answered. The patient knows to call the clinic with any problems, questions or concerns. No barriers to learning  was detected.  I spent 15 minutes counseling the patient face to face. The total time spent in the appointment was 20 minutes and more than 50% was on counseling and review of test results  Heath Lark, MD 10/14/2017 10:48 AM

## 2017-10-16 ENCOUNTER — Inpatient Hospital Stay: Payer: Medicare Other

## 2017-10-16 ENCOUNTER — Other Ambulatory Visit: Payer: Self-pay | Admitting: Family

## 2017-10-16 ENCOUNTER — Ambulatory Visit: Payer: Medicare Other

## 2017-10-16 VITALS — BP 128/74 | HR 80 | Temp 97.8°F | Resp 18

## 2017-10-16 DIAGNOSIS — Z5111 Encounter for antineoplastic chemotherapy: Secondary | ICD-10-CM | POA: Diagnosis not present

## 2017-10-16 DIAGNOSIS — Z5189 Encounter for other specified aftercare: Secondary | ICD-10-CM | POA: Diagnosis not present

## 2017-10-16 DIAGNOSIS — C541 Malignant neoplasm of endometrium: Secondary | ICD-10-CM

## 2017-10-16 MED ORDER — PEGFILGRASTIM-CBQV 6 MG/0.6ML ~~LOC~~ SOSY
6.0000 mg | PREFILLED_SYRINGE | Freq: Once | SUBCUTANEOUS | Status: AC
  Administered 2017-10-16: 6 mg via SUBCUTANEOUS

## 2017-10-16 MED ORDER — PEGFILGRASTIM-CBQV 6 MG/0.6ML ~~LOC~~ SOSY
PREFILLED_SYRINGE | SUBCUTANEOUS | Status: AC
  Filled 2017-10-16: qty 0.6

## 2017-10-28 ENCOUNTER — Ambulatory Visit (INDEPENDENT_AMBULATORY_CARE_PROVIDER_SITE_OTHER): Payer: Medicare Other | Admitting: *Deleted

## 2017-10-28 DIAGNOSIS — E538 Deficiency of other specified B group vitamins: Secondary | ICD-10-CM | POA: Diagnosis not present

## 2017-10-28 NOTE — Progress Notes (Signed)
Pt given Cyanocobalamin inj Tolerated well 

## 2017-11-04 ENCOUNTER — Encounter: Payer: Self-pay | Admitting: Hematology and Oncology

## 2017-11-04 ENCOUNTER — Inpatient Hospital Stay: Payer: Medicare Other | Attending: Hematology and Oncology

## 2017-11-04 ENCOUNTER — Inpatient Hospital Stay (HOSPITAL_BASED_OUTPATIENT_CLINIC_OR_DEPARTMENT_OTHER): Payer: Medicare Other | Admitting: Hematology and Oncology

## 2017-11-04 ENCOUNTER — Other Ambulatory Visit: Payer: Self-pay | Admitting: Hematology and Oncology

## 2017-11-04 ENCOUNTER — Inpatient Hospital Stay: Payer: Medicare Other

## 2017-11-04 ENCOUNTER — Telehealth: Payer: Self-pay | Admitting: Hematology and Oncology

## 2017-11-04 DIAGNOSIS — C541 Malignant neoplasm of endometrium: Secondary | ICD-10-CM

## 2017-11-04 DIAGNOSIS — G620 Drug-induced polyneuropathy: Secondary | ICD-10-CM

## 2017-11-04 DIAGNOSIS — T451X5A Adverse effect of antineoplastic and immunosuppressive drugs, initial encounter: Secondary | ICD-10-CM | POA: Diagnosis not present

## 2017-11-04 DIAGNOSIS — E1169 Type 2 diabetes mellitus with other specified complication: Secondary | ICD-10-CM

## 2017-11-04 DIAGNOSIS — Z5111 Encounter for antineoplastic chemotherapy: Secondary | ICD-10-CM | POA: Insufficient documentation

## 2017-11-04 DIAGNOSIS — R350 Frequency of micturition: Secondary | ICD-10-CM | POA: Diagnosis not present

## 2017-11-04 DIAGNOSIS — Z5189 Encounter for other specified aftercare: Secondary | ICD-10-CM | POA: Diagnosis not present

## 2017-11-04 DIAGNOSIS — E785 Hyperlipidemia, unspecified: Secondary | ICD-10-CM

## 2017-11-04 LAB — CBC WITH DIFFERENTIAL (CANCER CENTER ONLY)
BASOS ABS: 0 10*3/uL (ref 0.0–0.1)
Basophils Relative: 0 %
EOS ABS: 0 10*3/uL (ref 0.0–0.5)
EOS PCT: 0 %
HCT: 29.3 % — ABNORMAL LOW (ref 34.8–46.6)
Hemoglobin: 10.1 g/dL — ABNORMAL LOW (ref 11.6–15.9)
Lymphocytes Relative: 10 %
Lymphs Abs: 0.8 10*3/uL — ABNORMAL LOW (ref 0.9–3.3)
MCH: 33 pg (ref 25.1–34.0)
MCHC: 34.4 g/dL (ref 31.5–36.0)
MCV: 95.8 fL (ref 79.5–101.0)
Monocytes Absolute: 0.1 10*3/uL (ref 0.1–0.9)
Monocytes Relative: 1 %
Neutro Abs: 7.4 10*3/uL — ABNORMAL HIGH (ref 1.5–6.5)
Neutrophils Relative %: 89 %
PLATELETS: 170 10*3/uL (ref 145–400)
RBC: 3.06 MIL/uL — ABNORMAL LOW (ref 3.70–5.45)
RDW: 19.4 % — ABNORMAL HIGH (ref 11.2–14.5)
WBC: 8.3 10*3/uL (ref 3.9–10.3)

## 2017-11-04 LAB — CMP (CANCER CENTER ONLY)
ALT: 16 U/L (ref 0–55)
AST: 18 U/L (ref 5–34)
Albumin: 3.7 g/dL (ref 3.5–5.0)
Alkaline Phosphatase: 50 U/L (ref 40–150)
Anion gap: 10 (ref 3–11)
BILIRUBIN TOTAL: 0.6 mg/dL (ref 0.2–1.2)
BUN: 15 mg/dL (ref 7–26)
CALCIUM: 9.6 mg/dL (ref 8.4–10.4)
CHLORIDE: 103 mmol/L (ref 98–109)
CO2: 25 mmol/L (ref 22–29)
CREATININE: 0.96 mg/dL (ref 0.60–1.10)
GFR, Estimated: 57 mL/min — ABNORMAL LOW (ref >60)
Glucose, Bld: 165 mg/dL — ABNORMAL HIGH (ref 70–140)
Potassium: 4.4 mmol/L (ref 3.5–5.1)
Sodium: 138 mmol/L (ref 136–145)
TOTAL PROTEIN: 6.8 g/dL (ref 6.4–8.3)

## 2017-11-04 MED ORDER — HEPARIN SOD (PORK) LOCK FLUSH 100 UNIT/ML IV SOLN
500.0000 [IU] | Freq: Once | INTRAVENOUS | Status: AC | PRN
  Administered 2017-11-04: 500 [IU]
  Filled 2017-11-04: qty 5

## 2017-11-04 MED ORDER — DIPHENHYDRAMINE HCL 50 MG/ML IJ SOLN
INTRAMUSCULAR | Status: AC
  Filled 2017-11-04: qty 1

## 2017-11-04 MED ORDER — FAMOTIDINE IN NACL 20-0.9 MG/50ML-% IV SOLN
20.0000 mg | Freq: Once | INTRAVENOUS | Status: AC
  Administered 2017-11-04: 20 mg via INTRAVENOUS

## 2017-11-04 MED ORDER — SODIUM CHLORIDE 0.9 % IV SOLN
175.0000 mg/m2 | Freq: Once | INTRAVENOUS | Status: AC
  Administered 2017-11-04: 324 mg via INTRAVENOUS
  Filled 2017-11-04: qty 54

## 2017-11-04 MED ORDER — DIPHENHYDRAMINE HCL 50 MG/ML IJ SOLN
50.0000 mg | Freq: Once | INTRAMUSCULAR | Status: AC
  Administered 2017-11-04: 50 mg via INTRAVENOUS

## 2017-11-04 MED ORDER — SODIUM CHLORIDE 0.9 % IV SOLN
520.0000 mg | Freq: Once | INTRAVENOUS | Status: AC
  Administered 2017-11-04: 520 mg via INTRAVENOUS
  Filled 2017-11-04: qty 52

## 2017-11-04 MED ORDER — PALONOSETRON HCL INJECTION 0.25 MG/5ML
INTRAVENOUS | Status: AC
  Filled 2017-11-04: qty 5

## 2017-11-04 MED ORDER — SODIUM CHLORIDE 0.9 % IV SOLN
Freq: Once | INTRAVENOUS | Status: AC
  Administered 2017-11-04: 12:00:00 via INTRAVENOUS

## 2017-11-04 MED ORDER — SODIUM CHLORIDE 0.9 % IV SOLN
Freq: Once | INTRAVENOUS | Status: AC
  Administered 2017-11-04: 12:00:00 via INTRAVENOUS
  Filled 2017-11-04: qty 5

## 2017-11-04 MED ORDER — SODIUM CHLORIDE 0.9% FLUSH
10.0000 mL | INTRAVENOUS | Status: DC | PRN
  Administered 2017-11-04: 10 mL
  Filled 2017-11-04: qty 10

## 2017-11-04 MED ORDER — FAMOTIDINE IN NACL 20-0.9 MG/50ML-% IV SOLN
INTRAVENOUS | Status: AC
  Filled 2017-11-04: qty 50

## 2017-11-04 MED ORDER — PALONOSETRON HCL INJECTION 0.25 MG/5ML
0.2500 mg | Freq: Once | INTRAVENOUS | Status: AC
  Administered 2017-11-04: 0.25 mg via INTRAVENOUS

## 2017-11-04 MED ORDER — SODIUM CHLORIDE 0.9% FLUSH
10.0000 mL | Freq: Once | INTRAVENOUS | Status: AC
  Administered 2017-11-04: 10 mL
  Filled 2017-11-04: qty 10

## 2017-11-04 NOTE — Patient Instructions (Signed)
   [facility] Discharge Instructions for Patients Receiving Chemotherapy  Today you received the following chemotherapy agents Taxol and Carboplatin   To help prevent nausea and vomiting after your treatment, we encourage you to take your nausea medication as directed.    If you develop nausea and vomiting that is not controlled by your nausea medication, call the clinic.   BELOW ARE SYMPTOMS THAT SHOULD BE REPORTED IMMEDIATELY:  *FEVER GREATER THAN 100.5 F  *CHILLS WITH OR WITHOUT FEVER  NAUSEA AND VOMITING THAT IS NOT CONTROLLED WITH YOUR NAUSEA MEDICATION  *UNUSUAL SHORTNESS OF BREATH  *UNUSUAL BRUISING OR BLEEDING  TENDERNESS IN MOUTH AND THROAT WITH OR WITHOUT PRESENCE OF ULCERS  *URINARY PROBLEMS  *BOWEL PROBLEMS  UNUSUAL RASH Items with * indicate a potential emergency and should be followed up as soon as possible.  Feel free to call the clinic should you have any questions or concerns. The clinic phone number is [phone].  Please show the CHEMO ALERT CARD at check-in to the Emergency Department and triage nurse.   

## 2017-11-04 NOTE — Assessment & Plan Note (Signed)
she has mild peripheral neuropathy, likely related to side effects of treatment. It is only mild, not bothering the patient. I will observe for now If it gets worse in the future, I will consider modifying the dose of the treatment  

## 2017-11-04 NOTE — Telephone Encounter (Signed)
Gave avs and calendar trying to find 5/29 chemo

## 2017-11-04 NOTE — Progress Notes (Signed)
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 It is only mild, not bothering the patient. I will observe for now If it gets worse in the future, I will consider modifying the dose of the treatment  Hyperlipidemia associated with type 2 diabetes mellitus ([place]) The patient had significant dexamethasone-induced hyperglycemia I recommend continue on reduced dose premedication dexamethasone and I discussed dietary modification around the time of the chemotherapy   No orders of the defined types were placed in this encounter.   INTERVAL HISTORY: Please see below for problem oriented charting. She returns for further follow-up, to be seen prior to cycle 4 of chemotherapy She tolerated treatment well Denies worsening peripheral neuropathy Denies severe hypoglycemia after each treatment No recent significant nausea, vomiting or changes in bowel habits No recent infection  SUMMARY OF ONCOLOGIC HISTORY: Oncology History   endometroid MSI by IHC showed loss of nuclear expression, BRAF neg. Neg genetic testing     Endometrial adenocarcinoma ([place])   06/13/2017 Procedure    PROCEDURE: Hysteroscopy, D&C  SURGEON: Richard D. Deatra Ina M.D.  FINDINGS: Hypertrophic endometrial tissue, no polyps, no myoma         06/13/2017 Pathology Results    Endometrium, curettage ENDOMETRIOID ADENOCARCINOMA, FIGO GRADE 1      08/05/2017 Pathology Results    1. Lymph node, sentinel, biopsy, right external iliac - NO  CARCINOMA IDENTIFIED IN ONE LYMPH NODE (0/1) - SEE COMMENT 2. Lymph node, sentinel, biopsy, left external iliac #1 - NO CARCINOMA IDENTIFIED IN ONE LYMPH NODE (0/1) - SEE COMMENT 3. Lymph node, sentinel, biopsy, left external iliac #2 - NO CARCINOMA IDENTIFIED IN ONE LYMPH NODE (0/1) - SEE COMMENT 4. Uterus +/- tubes/ovaries, neoplastic, ovaries UTERUS: - ENDOMETRIOID CARCINOMA, FIGO GRADE 2, INVOLVING THE ENTIRE POSTERIOR MYOMETRIUM WITH TUMOR PRESENT AT INKED SEROSAL SURFACE - SEE ONCOLOGY TABLE BELOW CERVIX: - NABOTHIAN CYSTS - NO DYSPLASIA OR MALIGNANCY IDENTIFIED BILATERAL OVARIES: - NO CARCINOMA IDENTIFIED BILATERAL FALLOPIAN TUBES: - ENDOMETRIOID CARCINOMA INVOLVING LEFT FALLOPIAN TUBE  1. , 2 and 3. Cytokeratin AE1/3 was performed on the sentinel lymph nodes to exclude micrometastasis; they are negative. 4. ONCOLOGY TABLE-UTERUS, CARCINOMA OR CARCINOSARCOMA Specimen: Uterus, bilateral fallopian tubes and ovaries Procedure: Total hysterectomy and bilateral salpingo-oophorectomy Histologic type: Endometrioid carcinoma, NOS Grade: FIGO Grade 2 Myometrial invasion: >50% (The posterior myometrium has full thickness involvement with tumor extending to the inked serosal surface) Uterine Serosa involvement: Present Cervical stromal involvement: Not identified Other Tissue/Organ Involvement: Left fallopian tube Lymphovascular invasion: Not identified Peritoneal washings: Not submitted/unknown Lymph nodes: Examined: 3 Sentinel 0 Non-sentinel 3 Total Lymph nodes with metastasis: 0 Isolated tumor cells (< 0.2 mm): 0 Micrometastasis: (> 0.2 mm and < 2.0 mm): 0 Macrometastasis: (> 2.0 mm): 0 Extracapsular extension: N/A TNM code: pT3a, pN0 FIGO Stage (based on pathologic findings, needs clinical correlation): IIIa *Block 4I is representative of the tumor and suitable for additional studies, if requested. COMMENT: MMR (by immunohistochemistry) is pending and will be reported in  an addendum.      08/05/2017 Surgery    Operation: Robotic-assisted laparoscopic total hysterectomy with bilateral salpingoophorectomy, SLN  biopsy, umbilical hernia  Surgeon: Donaciano Eva  Operative Findings:  : 6cm normal appearing uterus, normal right tube and ovary, left ovary with cystic mass, left sigmoid colon diverticular disease, adhesions to left ovary.  No gross extrauterine disease.         08/14/2017 Imaging    1. Interval hysterectomy. No findings of recurrent or metastatic disease. 2. Small volume pelvic fluid and a presumed postoperative seroma or lymphangioma within the left hemipelvis. 3. Subtle irregular hepatic capsule is suspicious for mild cirrhosis. Correlate with risk factors. 4. Coronary artery atherosclerosis. Aortic Atherosclerosis (ICD10-I70.0).      08/27/2017 Procedure    Successful placement of a right internal jugular approach power injectable Port-A-Cath. The catheter is ready for immediate use.      09/02/2017 -  Chemotherapy    The patient had carboplatin and Taxol      09/11/2017 Genetic Testing    Patient has genetic testing done for: The genes analyzed were the 83 genes on [employer]'s Multi-Cancer panel (ALK, APC, ATM, AXIN2, BAP1, BARD1, BLM, BMPR1A, BRCA1, BRCA2, BRIP1, CASR, CDC73, CDH1, CDK4, CDKN1B, CDKN1C, CDKN2A, CEBPA, CHEK2, CTNNA1, DICER1, DIS3L2, EGFR, EPCAM, FH, FLCN, GATA2, GPC3, GREM1, HOXB13, HRAS, KIT, MAX, MEN1, MET, MITF, MLH1, MSH2, MSH3, MSH6, MUTYH, NBN, NF1, NF2, NTHL1, PALB2, PDGFRA, PHOX2B, PMS2, POLD1, POLE, POT1, PRKAR1A, PTCH1, PTEN, RAD50, RAD51C, RAD51D, RB1, RECQL4, RET, RUNX1, SDHA, SDHAF2, SDHB, SDHC, SDHD, SMAD4, SMARCA4, SMARCB1, SMARCE1, STK11, SUFU, TERC, TERT, TMEM127, TP53, TSC1, TSC2, VHL, WRN, WT1). Results revealed patient has no detectable mutation       REVIEW OF SYSTEMS:   Constitutional: Denies fevers, chills or abnormal weight loss Eyes: Denies blurriness of vision Ears, nose, mouth, throat,  and face: Denies mucositis or sore throat Respiratory: Denies cough, dyspnea or wheezes Cardiovascular: Denies palpitation, chest discomfort or lower extremity swelling Gastrointestinal:  Denies nausea, heartburn or change in bowel habits Skin: Denies abnormal skin rashes Lymphatics: Denies new lymphadenopathy or easy bruising Neurological:Denies numbness, tingling or new weaknesses Behavioral/Psych: Mood is stable, no new changes  All other systems were reviewed with the patient and are negative.  I have reviewed the past medical history, past surgical history, social history and family history with the patient and they are unchanged from previous note.  ALLERGIES:  is allergic to ace inhibitors; norvasc [amlodipine]; latex; neosporin [neomycin-bacitracin zn-polymyx]; and sulfa antibiotics.  MEDICATIONS:  Current Outpatient Medications  Medication Sig Dispense Refill  . acetaminophen (TYLENOL) 500 MG tablet Take 1,000 mg by mouth every 8 (eight) hours as needed for mild pain or headache.    . albuterol (PROVENTIL HFA;VENTOLIN HFA) 108 (90 BASE) MCG/ACT inhaler Inhale 2 puffs into the lungs every 6 (six) hours as needed for wheezing or shortness of breath. (Patient not taking: Reported on 09/08/2017) 1 Inhaler 1  . aspirin EC 81 MG tablet Take 81 mg by mouth daily.    . Calcium Citrate-Vitamin D ([employer] CALCIUM CITRATE +D PO) Take 2 tablets by mouth every evening.     . Cholecalciferol (D 2000) 2000 units TABS Take 2,000 Units by mouth daily.    . cloNIDine (CATAPRES) 0.1 MG tablet TAKE 1 TABLET BY MOUTH TWICE DAILY 180 tablet 1  . cyanocobalamin (,VITAMIN B-12,) 1000 MCG/ML injection Inject 1,000 mcg into the muscle every Thursday.    . cycloSPORINE (RESTASIS) 0.05 % ophthalmic emulsion Place 1 drop into both eyes 2 (two) times daily.     Marland Kitchen dexamethasone (DECADRON) 4 MG tablet Take 3 tabs at night and  3 tabs at 6 am the morning of chemotherapy, every 3 weeks with food 36 tablet 0  . Famotidine  (PEPCID AC PO) Take by mouth every morning.    . furosemide (LASIX) 40 MG tablet TAKE 1 TABLET BY MOUTH ONCE DAILY 90 tablet 0  . JANUVIA 50 MG tablet TAKE 1 TABLET BY MOUTH ONCE DAILY 90 tablet 0  . levothyroxine (SYNTHROID, LEVOTHROID) 100 MCG tablet TAKE 1 TABLET BY MOUTH ONCE DAILY 90 tablet 2  . lidocaine-prilocaine (EMLA) cream Apply to affected area once 30 g 3  . losartan (COZAAR) 100 MG tablet Take 1 tablet (100 mg total) by mouth daily. 90 tablet 0  . Magnesium 250 MG TABS Take 250 mg by mouth every evening.     . meclizine (ANTIVERT) 32 MG tablet Take 1 tablet (32 mg total) by mouth 3 (three) times daily as needed for dizziness. 30 tablet 0  . meclizine (ANTIVERT) 50 MG tablet Take 1 tablet (50 mg total) by mouth 2 (two) times daily as needed. (Patient not taking: Reported on 09/18/2017) 30 tablet 0  . metFORMIN (GLUCOPHAGE) 500 MG tablet TAKE 2 TABLETS BY MOUTH IN THE MORNING AND 1 IN THE EVENING 270 tablet 0  . metoprolol succinate (TOPROL-XL) 50 MG 24 hr tablet TAKE ONE TABLET BY MOUTH TWICE DAILY WITH  OR  IMMEDIATELY  FOLLOWING  A  MEAL 180 tablet 4  . nitrofurantoin, macrocrystal-monohydrate, (MACROBID) 100 MG capsule Take 1 capsule (100 mg total) by mouth 2 (two) times daily. Take for 5 days. 10 capsule 0  . Omega-3 Fatty Acids (FISH OIL) 1000 MG CAPS Take 1,000 mg by mouth every evening.    . ondansetron (ZOFRAN) 8 MG tablet Take 1 tablet (8 mg total) by mouth 2 (two) times daily as needed for refractory nausea / vomiting. Start on day 3 after chemo. 30 tablet 1  . ONETOUCH DELICA LANCETS 44H MISC USE 1 TO CHECK GLUCOSE ONCE DAILY 100 each 1  . ONETOUCH VERIO test strip USE TO CHECK GLUCOSE DAILY 100 each 3  . prochlorperazine (COMPAZINE) 10 MG tablet Take 1 tablet (10 mg total) by mouth every 6 (six) hours as needed (Nausea or vomiting). 30 tablet 1  . simvastatin (ZOCOR) 40 MG tablet TAKE 1 TABLET BY MOUTH ONCE DAILY AT 6PM 90 tablet 1  . vitamin C (ASCORBIC ACID) 500 MG tablet  Take 500 mg by mouth daily.     Current Facility-Administered Medications  Medication Dose Route Frequency Provider Last Rate Last Dose  . cyanocobalamin ((VITAMIN B-12)) injection 1,000 mcg  1,000 mcg Intramuscular Daily Evelina Dun A, FNP   1,000 mcg at 10/28/17 6759    PHYSICAL EXAMINATION: ECOG PERFORMANCE STATUS: 1 - Symptomatic but completely ambulatory  Vitals:   11/04/17 1013  BP: 111/65  Pulse: 68  Resp: 18  Temp: 97.9 F (36.6 C)  SpO2: 97%   Filed Weights   11/04/17 1013  Weight: 161 lb 12.8 oz (73.4 kg)    GENERAL:alert, no distress and comfortable SKIN: skin color, texture, turgor are normal, no rashes or significant lesions EYES: normal, Conjunctiva are pink and non-injected, sclera clear OROPHARYNX:no exudate, no erythema and lips, buccal mucosa, and tongue normal  NECK: supple, thyroid normal size, non-tender, without nodularity LYMPH:  no palpable lymphadenopathy in the cervical, axillary or inguinal LUNGS: clear to auscultation and percussion with normal breathing effort HEART: regular rate & rhythm and no murmurs and no lower extremity edema ABDOMEN:abdomen soft, non-tender and normal bowel sounds Musculoskeletal:no  cyanosis of digits and no clubbing  NEURO: alert & oriented x 3 with fluent speech, no focal motor/sensory deficits  LABORATORY DATA:  I have reviewed the data as listed    Component Value Date/Time   NA 138 11/04/2017 0931   NA 142 08/26/2017 0908   K 4.4 11/04/2017 0931   CL 103 11/04/2017 0931   CO2 25 11/04/2017 0931   GLUCOSE 165 (H) 11/04/2017 0931   BUN 15 11/04/2017 0931   BUN 12 08/26/2017 0908   CREATININE 0.96 11/04/2017 0931   CALCIUM 9.6 11/04/2017 0931   PROT 6.8 11/04/2017 0931   PROT 6.6 08/26/2017 0908   ALBUMIN 3.7 11/04/2017 0931   ALBUMIN 4.2 08/26/2017 0908   AST 18 11/04/2017 0931   ALT 16 11/04/2017 0931   ALKPHOS 50 11/04/2017 0931   BILITOT 0.6 11/04/2017 0931   GFRNONAA 57 (L) 11/04/2017 0931    GFRAA >60 11/04/2017 0931    No results found for: SPEP, UPEP  Lab Results  Component Value Date   WBC 8.3 11/04/2017   NEUTROABS 7.4 (H) 11/04/2017   HGB 10.1 (L) 11/04/2017   HCT 29.3 (L) 11/04/2017   MCV 95.8 11/04/2017   PLT 170 11/04/2017      Chemistry      Component Value Date/Time   NA 138 11/04/2017 0931   NA 142 08/26/2017 0908   K 4.4 11/04/2017 0931   CL 103 11/04/2017 0931   CO2 25 11/04/2017 0931   BUN 15 11/04/2017 0931   BUN 12 08/26/2017 0908   CREATININE 0.96 11/04/2017 0931      Component Value Date/Time   CALCIUM 9.6 11/04/2017 0931   ALKPHOS 50 11/04/2017 0931   AST 18 11/04/2017 0931   ALT 16 11/04/2017 0931   BILITOT 0.6 11/04/2017 0931       All questions were answered. The patient knows to call the clinic with any problems, questions or concerns. No barriers to learning was detected.  I spent 15 minutes counseling the patient face to face. The total time spent in the appointment was 20 minutes and more than 50% was on counseling and review of test results  Heath Lark, MD 11/04/2017 11:07 AM

## 2017-11-04 NOTE — Assessment & Plan Note (Signed)
The patient had significant dexamethasone-induced hyperglycemia I recommend continue on reduced dose premedication dexamethasone and I discussed dietary modification around the time of the chemotherapy

## 2017-11-04 NOTE — Assessment & Plan Note (Signed)
She tolerated treatment well except for mild peripheral neuropathy and hyperglycemia I recommend continue on reduced premedication dexamethasone for future cycle We would proceed with treatment without dose adjustment

## 2017-11-06 ENCOUNTER — Inpatient Hospital Stay: Payer: Medicare Other

## 2017-11-06 VITALS — BP 121/63 | HR 64 | Temp 97.9°F | Resp 18

## 2017-11-06 DIAGNOSIS — Z5189 Encounter for other specified aftercare: Secondary | ICD-10-CM | POA: Diagnosis not present

## 2017-11-06 DIAGNOSIS — Z5111 Encounter for antineoplastic chemotherapy: Secondary | ICD-10-CM | POA: Diagnosis not present

## 2017-11-06 DIAGNOSIS — C541 Malignant neoplasm of endometrium: Secondary | ICD-10-CM | POA: Diagnosis not present

## 2017-11-06 DIAGNOSIS — R350 Frequency of micturition: Secondary | ICD-10-CM | POA: Diagnosis not present

## 2017-11-06 MED ORDER — PEGFILGRASTIM-CBQV 6 MG/0.6ML ~~LOC~~ SOSY
PREFILLED_SYRINGE | SUBCUTANEOUS | Status: AC
Start: 2017-11-06 — End: ?
  Filled 2017-11-06: qty 0.6

## 2017-11-06 MED ORDER — PEGFILGRASTIM-CBQV 6 MG/0.6ML ~~LOC~~ SOSY
6.0000 mg | PREFILLED_SYRINGE | Freq: Once | SUBCUTANEOUS | Status: AC
  Administered 2017-11-06: 6 mg via SUBCUTANEOUS

## 2017-11-10 NOTE — Progress Notes (Addendum)
  Home Care Instructions for the Insertion and Care of Your Vaginal Dilator  Why Do I Need a Vaginal Dilator?  Internal radiation therapy may cause scar tissue to form at the top of your vagina (vaginal cuff).  This may make vaginal examinations difficult in the future. You can prevent scar tissue from forming by using a vaginal dilator (a smooth plastic rod), and/or by having regular sexual intercourse.  If not using the dilator you should be having intercourse two or three times a week.  If you are unable to have intercourse, you should use your vaginal dilator.  You may have some spotting or bleeding from your dilator or intercourse the first few times. You may also have some discomfort. If discomfort occurs with intercourse, you and your partner may need to stop for a while and try again later.  How to Use Your Vaginal Dilator  - Wash the dilator with soap and water before and after each use. - Check the dilator to be sure it is smooth. Do not use the dilator if you find any roughspots. - Coat the dilator with K-Y Jelly, Astroglide, or Replens. Do not use Vaseline, baby oil, or other oil based lubricants. They are not water-soluble and can be irritating to the tissues in the vagina. - Lie on your back with your knees bent and legs apart. - Insert the rounded end of the dilator into your vagina as far as it will go without causing pain or discomfort. - Close your knees and slowly straighten your legs. - Keep the dilator in your vagina for about 10 to 15 minutes.  Please use 3 times a week, for example: Monday, Wednesday and Friday evenings. [facility] your knees, open your legs, and gently remove the dilator. - Gently cleanse the skin around the vaginal opening. - Wash the dilator after each use. -  It is important that you use the dilator routinely until instructed otherwise by your doctor.  Instructions given to pt and husband who both verbalized understanding and agreement.  Instructed to call our office should they have any future issues or concerns.

## 2017-11-13 ENCOUNTER — Encounter: Payer: Self-pay | Admitting: Radiation Oncology

## 2017-11-13 ENCOUNTER — Ambulatory Visit
Admission: RE | Admit: 2017-11-13 | Discharge: 2017-11-13 | Disposition: A | Discharge status: Home / Self Care | Payer: Medicare Other | Source: Ambulatory Visit | Attending: Radiation Oncology | Admitting: Radiation Oncology

## 2017-11-13 ENCOUNTER — Other Ambulatory Visit: Payer: Self-pay

## 2017-11-13 VITALS — BP 125/78 | HR 69 | Temp 98.1°F | Resp 20 | Ht 61.0 in | Wt 158.0 lb

## 2017-11-13 DIAGNOSIS — Z923 Personal history of irradiation: Secondary | ICD-10-CM | POA: Diagnosis not present

## 2017-11-13 DIAGNOSIS — Z08 Encounter for follow-up examination after completed treatment for malignant neoplasm: Secondary | ICD-10-CM | POA: Diagnosis not present

## 2017-11-13 DIAGNOSIS — Z7984 Long term (current) use of oral hypoglycemic drugs: Secondary | ICD-10-CM | POA: Diagnosis not present

## 2017-11-13 DIAGNOSIS — Z79899 Other long term (current) drug therapy: Secondary | ICD-10-CM | POA: Diagnosis not present

## 2017-11-13 DIAGNOSIS — Z7982 Long term (current) use of aspirin: Secondary | ICD-10-CM | POA: Diagnosis not present

## 2017-11-13 DIAGNOSIS — C541 Malignant neoplasm of endometrium: Secondary | ICD-10-CM | POA: Diagnosis not present

## 2017-11-13 NOTE — Progress Notes (Signed)
Pt here today for a follow-up for endometrial cancer. Pt denies having any pain. Pt states that she has mild fatigue. Pt states that she has constipation and diarrhea after chemotherapy treatments. Pt states that she has not taken Imodium or stool softners. Pt states that she has some nausea and takes antiemetics to help. Pt denies having any vomiting. Pt states that she has mild dysuria during urination. Pt states that she has had urgency and frequency. Pt states that she has had some leakage prior to urination. Pt denies having any vaginal bleeding or discharge. Pt states that she has had rectal bleeding due constipation. Pt states that she has some irritation in her pelvic area. Pt states that she has used Aquaphor and hydrocortisone.   BP 125/78 (BP Location: Right Arm, Patient Position: Sitting, Cuff Size: Normal)   Pulse 69   Temp 98.1 F (36.7 C) (Oral)   Resp 20   Ht 5\' 1"  (1.549 m)   Wt 158 lb (71.7 kg)   SpO2 96%   BMI 29.85 kg/m   Wt Readings from Last 3 Encounters:  11/13/17 158 lb (71.7 kg)  11/04/17 161 lb 12.8 oz (73.4 kg)  10/14/17 159 lb 8 oz (72.3 kg)

## 2017-11-13 NOTE — Progress Notes (Signed)
Radiation Oncology         [phone] ________________________________  Name: Alexandria Irwin MRN: [id number]  Date: 11/13/2017  DOB: 01/11/1946  Follow-Up Visit Note  CC: Hawks, Christy A, FNP  Rossi, Emma, MD    ICD-10-CM   1. Endometrial adenocarcinoma (HCC) C54.1   2. 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  Interval Since Last Radiation:  4 weeks ago  09/18/2017 - 10/13/2017 30 Gy directed to the Vaginal Cuff in 5 fractions HDR Ir- 192 Vaginal/ Iridium- 192  Narrative:  The patient returns today for routine follow-up.  Doing well overall.                              ALLERGIES:  is allergic to ace inhibitors; norvasc [amlodipine]; latex; neosporin [neomycin-bacitracin zn-polymyx]; and sulfa antibiotics.  Meds: Current Outpatient Medications  Medication Sig Dispense Refill  . acetaminophen (TYLENOL) 500 MG tablet Take 1,000 mg by mouth every 8 (eight) hours as needed for mild pain or headache.    . albuterol (PROVENTIL HFA;VENTOLIN HFA) 108 (90 BASE) MCG/ACT inhaler Inhale 2 puffs into the lungs every 6 (six) hours as needed for wheezing or shortness of breath. 1 Inhaler 1  . aspirin EC 81 MG tablet Take 81 mg by mouth daily.    . Calcium Citrate-Vitamin D ([employer]S CALCIUM CITRATE +D PO) Take 2 tablets by mouth every evening.     . Cholecalciferol (D 2000) 2000 units TABS Take 2,000 Units by mouth daily.    . cloNIDine (CATAPRES) 0.1 MG tablet TAKE 1 TABLET BY MOUTH TWICE DAILY 180 tablet 1  . cyanocobalamin (,VITAMIN B-12,) 1000 MCG/ML injection Inject 1,000 mcg into the muscle every Thursday.    . cycloSPORINE (RESTASIS) 0.05 % ophthalmic emulsion Place 1 drop into both eyes 2 (two) times daily.     . dexamethasone (DECADRON) 4 MG tablet Take 3 tabs at night and 3 tabs at 6 am the morning of chemotherapy, every 3 weeks with food 36 tablet 0  .  Famotidine (PEPCID AC PO) Take by mouth every morning.    . furosemide (LASIX) 40 MG tablet TAKE 1 TABLET BY MOUTH ONCE DAILY 90 tablet 0  . JANUVIA 50 MG tablet TAKE 1 TABLET BY MOUTH ONCE DAILY 90 tablet 0  . levothyroxine (SYNTHROID, LEVOTHROID) 100 MCG tablet TAKE 1 TABLET BY MOUTH ONCE DAILY 90 tablet 2  . lidocaine-prilocaine (EMLA) cream Apply to affected area once 30 g 3  . losartan (COZAAR) 100 MG tablet Take 1 tablet (100 mg total) by mouth daily. 90 tablet 0  . Magnesium 250 MG TABS Take 250 mg by mouth every evening.     . meclizine (ANTIVERT) 32 MG tablet Take 1 tablet (32 mg total) by mouth 3 (three) times daily as needed for dizziness. 30 tablet 0  . meclizine (ANTIVERT) 50 MG tablet Take 1 tablet (50 mg total) by mouth 2 (two) times daily as needed. 30 tablet 0  . metFORMIN (GLUCOPHAGE) 500 MG tablet TAKE 2 TABLETS BY MOUTH IN THE MORNING AND 1 IN THE EVENING 270 tablet 0  . metoprolol succinate (TOPROL-XL) 50 MG 24 hr tablet TAKE ONE TABLET BY MOUTH TWICE DAILY WITH  OR  IMMEDIATELY  FOLLOWING  A  MEAL 180 tablet 4  . nitrofurantoin, macrocrystal-monohydrate, (MACROBID) 100 MG capsule   Take 1 capsule (100 mg total) by mouth 2 (two) times daily. Take for 5 days. 10 capsule 0  . Omega-3 Fatty Acids (FISH OIL) 1000 MG CAPS Take 1,000 mg by mouth every evening.    . ondansetron (ZOFRAN) 8 MG tablet Take 1 tablet (8 mg total) by mouth 2 (two) times daily as needed for refractory nausea / vomiting. Start on day 3 after chemo. 30 tablet 1  . ONETOUCH DELICA LANCETS 71I MISC USE 1 TO CHECK GLUCOSE ONCE DAILY 100 each 1  . ONETOUCH VERIO test strip USE TO CHECK GLUCOSE DAILY 100 each 3  . prochlorperazine (COMPAZINE) 10 MG tablet Take 1 tablet (10 mg total) by mouth every 6 (six) hours as needed (Nausea or vomiting). 30 tablet 1  . simvastatin (ZOCOR) 40 MG tablet TAKE 1 TABLET BY MOUTH ONCE DAILY AT 6PM 90 tablet 1  . vitamin C (ASCORBIC ACID) 500 MG tablet Take 500 mg by mouth daily.      Current Facility-Administered Medications  Medication Dose Route Frequency Provider Last Rate Last Dose  . cyanocobalamin ((VITAMIN B-12)) injection 1,000 mcg  1,000 mcg Intramuscular Daily Evelina Dun A, FNP   1,000 mcg at 10/28/17 4580    Physical Findings: The patient is in no acute distress. Patient is alert and oriented.  height is 5' 1" (1.549 m) and weight is 158 lb (71.7 kg). Her oral temperature is 98.1 F (36.7 C). Her blood pressure is 125/78 and her pulse is 69. Her respiration is 20 and oxygen saturation is 96%. .  No significant changes. Lungs are clear to auscultation bilaterally. Heart has regular rate and rhythm. No palpable cervical, supraclavicular, or axillary adenopathy. Abdomen soft, non-tender, normal bowel sounds.  Pelvic exam was not performed, in light of recent treatment.  Lab Findings: Lab Results  Component Value Date   WBC 8.3 11/04/2017   HGB 10.1 (L) 11/04/2017   HCT 29.3 (L) 11/04/2017   MCV 95.8 11/04/2017   PLT 170 11/04/2017    Radiographic Findings: No results found.  Impression:  The patient is recovering from the effects of radiation.  Plan:  Vaginal dilator was given to patient with proper instructions on how to use it. Patient will have a routine follow up in 5 months with radiation oncology. She will follow up with Dr. Denman George in 2 months and finish her last two cycles of chemotherapy.  ____________________________________  Blair Promise, PhD, MD  This document serves as a record of services personally performed by Gery Pray MD. It was created on his behalf by Delton Coombes, a trained medical scribe. The creation of this record is based on the scribe's personal observations and the provider's statements to them.

## 2017-11-19 ENCOUNTER — Telehealth: Payer: Self-pay

## 2017-11-19 ENCOUNTER — Other Ambulatory Visit: Payer: Self-pay

## 2017-11-19 DIAGNOSIS — R300 Dysuria: Secondary | ICD-10-CM

## 2017-11-19 NOTE — Telephone Encounter (Signed)
Pt called saying she felt her UTI was back and wanted to see if Dr. Sondra Come would refill her Macrobid prescription. Updated Dr. Sondra Come who stated he would prefer pt come in for a urinalysis/culture, and see him after so he can assess her himself.   Orders placed and appts made. Pt called back with update and verbalized understanding and agreement to plan. Told pt to call back should she have any further questions/issues.

## 2017-11-20 ENCOUNTER — Telehealth: Payer: Self-pay | Admitting: Family

## 2017-11-20 ENCOUNTER — Ambulatory Visit
Admission: RE | Admit: 2017-11-20 | Discharge: 2017-11-20 | Disposition: A | Discharge status: Home / Self Care | Payer: Medicare Other | Source: Ambulatory Visit | Attending: Radiation Oncology | Admitting: Radiation Oncology

## 2017-11-20 ENCOUNTER — Encounter: Payer: Self-pay | Admitting: Radiation Oncology

## 2017-11-20 ENCOUNTER — Ambulatory Visit: Payer: Medicare Other

## 2017-11-20 ENCOUNTER — Other Ambulatory Visit: Payer: Self-pay

## 2017-11-20 VITALS — BP 139/79 | HR 78 | Temp 98.4°F | Resp 20 | Ht 61.0 in | Wt 160.1 lb

## 2017-11-20 DIAGNOSIS — C541 Malignant neoplasm of endometrium: Secondary | ICD-10-CM | POA: Diagnosis not present

## 2017-11-20 DIAGNOSIS — R300 Dysuria: Secondary | ICD-10-CM

## 2017-11-20 DIAGNOSIS — N898 Other specified noninflammatory disorders of vagina: Secondary | ICD-10-CM | POA: Diagnosis not present

## 2017-11-20 DIAGNOSIS — Z08 Encounter for follow-up examination after completed treatment for malignant neoplasm: Secondary | ICD-10-CM | POA: Diagnosis not present

## 2017-11-20 LAB — URINALYSIS, COMPLETE (UACMP) WITH MICROSCOPIC
Bacteria, UA: NONE SEEN
Bilirubin Urine: NEGATIVE
GLUCOSE, UA: NEGATIVE mg/dL
Hgb urine dipstick: NEGATIVE
Ketones, ur: NEGATIVE mg/dL
Nitrite: NEGATIVE
PH: 7 (ref 5.0–8.0)
Protein, ur: NEGATIVE mg/dL
SPECIFIC GRAVITY, URINE: 1.006 (ref 1.005–1.030)

## 2017-11-20 NOTE — Telephone Encounter (Signed)
Pt denied  °

## 2017-11-20 NOTE — Progress Notes (Signed)
Radiation Oncology         [phone] ________________________________  Name: Alexandria Irwin MRN: [id number]  Date: 11/20/2017  DOB: 1946-03-25  Follow-Up Visit Note  CC: Sharion Balloon, FNP  Everitt Amber, MD    ICD-10-CM   1. Endometrial adenocarcinoma (HCC) C54.1     Diagnosis:   72 y.o. 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 She states that her symptoms are slightly better today, but yesterday they were "unbearable". She has been using KY lubricant with her dilators and has noticed increased burning and irritation. She is worried she may have developed an allergic reaction to the product. She reports itching and burning in the perineal area. She states that she applied hydrocortisone cream to her outer vaginal area last night. She reports frequency and urgency with urination but very little urine when she does void. She states that she has tried taking Azo before, but it did not give her any relief.                             ALLERGIES:  is allergic to ace inhibitors; norvasc [amlodipine]; latex; neosporin [neomycin-bacitracin zn-polymyx]; and sulfa antibiotics.  Meds: Current Outpatient Medications  Medication Sig Dispense Refill  . acetaminophen (TYLENOL) 500 MG tablet Take 1,000 mg by mouth every 8 (eight) hours as needed for mild pain or headache.    . albuterol (PROVENTIL HFA;VENTOLIN HFA) 108 (90 BASE) MCG/ACT inhaler Inhale 2 puffs into the lungs every 6 (six) hours as needed for wheezing or shortness of breath. 1 Inhaler 1  . aspirin EC 81 MG tablet Take 81 mg by mouth daily.    . Calcium Citrate-Vitamin D ([employer] CALCIUM CITRATE +D PO) Take 2 tablets by mouth every evening.      . Cholecalciferol (D 2000) 2000 units TABS Take 2,000 Units by mouth daily.    . cloNIDine (CATAPRES) 0.1 MG tablet TAKE 1 TABLET BY MOUTH TWICE DAILY 180 tablet 1  . cyanocobalamin (,VITAMIN B-12,) 1000 MCG/ML injection Inject 1,000 mcg into the muscle every Thursday.    . cycloSPORINE (RESTASIS) 0.05 % ophthalmic emulsion Place 1 drop into both eyes 2 (two) times daily.     Marland Kitchen dexamethasone (DECADRON) 4 MG tablet Take 3 tabs at night and 3 tabs at 6 am the morning of chemotherapy, every 3 weeks with food 36 tablet 0  . Famotidine (PEPCID AC PO) Take by mouth every morning.    . furosemide (LASIX) 40 MG tablet TAKE 1 TABLET BY MOUTH ONCE DAILY 90 tablet 0  . JANUVIA 50 MG tablet TAKE 1 TABLET BY MOUTH ONCE DAILY 90 tablet 0  . levothyroxine (SYNTHROID, LEVOTHROID) 100 MCG tablet TAKE 1 TABLET BY MOUTH ONCE DAILY 90 tablet 2  . lidocaine-prilocaine (EMLA) cream Apply to affected area once 30 g 3  . losartan (COZAAR) 100 MG tablet Take 1 tablet (100 mg total) by mouth daily. 90 tablet 0  . Magnesium 250 MG TABS Take 250 mg by mouth every evening.     . meclizine (ANTIVERT) 32 MG tablet Take 1 tablet (32 mg total) by mouth 3 (three) times daily as needed for  dizziness. 30 tablet 0  . meclizine (ANTIVERT) 50 MG tablet Take 1 tablet (50 mg total) by mouth 2 (two) times daily as needed. 30 tablet 0  . metFORMIN (GLUCOPHAGE) 500 MG tablet TAKE 2 TABLETS BY MOUTH IN THE MORNING AND 1 IN THE EVENING 270 tablet 0  . metoprolol succinate (TOPROL-XL) 50 MG 24 hr tablet TAKE ONE TABLET BY MOUTH TWICE DAILY WITH  OR  IMMEDIATELY  FOLLOWING  A  MEAL 180 tablet 4  . nitrofurantoin, macrocrystal-monohydrate, (MACROBID) 100 MG capsule Take 1 capsule (100 mg total) by mouth 2 (two) times daily. Take for 5 days. 10 capsule 0  . Omega-3 Fatty Acids (FISH OIL) 1000 MG CAPS Take 1,000 mg by mouth every evening.    . ondansetron (ZOFRAN) 8 MG tablet Take 1 tablet (8 mg total) by mouth 2 (two) times daily as needed  for refractory nausea / vomiting. Start on day 3 after chemo. 30 tablet 1  . ONETOUCH DELICA LANCETS 39Q MISC USE 1 TO CHECK GLUCOSE ONCE DAILY 100 each 1  . ONETOUCH VERIO test strip USE TO CHECK GLUCOSE DAILY 100 each 3  . prochlorperazine (COMPAZINE) 10 MG tablet Take 1 tablet (10 mg total) by mouth every 6 (six) hours as needed (Nausea or vomiting). 30 tablet 1  . simvastatin (ZOCOR) 40 MG tablet TAKE 1 TABLET BY MOUTH ONCE DAILY AT 6PM 90 tablet 1  . vitamin C (ASCORBIC ACID) 500 MG tablet Take 500 mg by mouth daily.     Current Facility-Administered Medications  Medication Dose Route Frequency Provider Last Rate Last Dose  . cyanocobalamin ((VITAMIN B-12)) injection 1,000 mcg  1,000 mcg Intramuscular Daily Evelina Dun A, FNP   1,000 mcg at 10/28/17 3009    Physical Findings: The patient is in no acute distress. Patient is alert and oriented.  height is 5' 1" (1.549 m) and weight is 160 lb 2 oz (72.6 kg). Her oral temperature is 98.4 F (36.9 C). Her blood pressure is 139/79 and her pulse is 78. Her respiration is 20 and oxygen saturation is 98%.   Lungs are clear to auscultation bilaterally. Heart has regular rate and rhythm. No palpable cervical, supraclavicular, or axillary adenopathy. Abdomen soft, non-tender, normal bowel sounds.  Limited pelvic exam shows no sign of erythema to the external genitalia. Speculum exam was performed, and no mucosal lesions were noted in the vaginal vault. No signs of infection.   Lab Findings: Lab Results  Component Value Date   WBC 8.3 11/04/2017   HGB 10.1 (L) 11/04/2017   HCT 29.3 (L) 11/04/2017   MCV 95.8 11/04/2017   PLT 170 11/04/2017    Radiographic Findings: No results found.  Impression:  No evidence of recurrence on limited pelvic exam today. No signs of yeast infection or significant inflammation.   Plan:  I recommended the patient hold off on using her dilator over the next couple weeks. She was given additional samples of  Astroglide to use to see if she may be irritated from the SurgiLube. Preliminary results form urinalysis today do not suggest that she has a bladder infection, so we will not restart the antibiotics. Patient will receive additional chemotherapy next week and will return for follow-up in 5 months in radiation oncology. She will see Dr. Denman George in 2 months.   ____________________________________  Blair Promise, PhD, MD  This document serves as a record of services personally performed by Gery Pray, MD. It was created on his behalf by Rae Lips, a  trained medical scribe. The creation of this record is based on the scribe's personal observations and the provider's statements to them. This document has been checked and approved by the attending provider.

## 2017-11-20 NOTE — Addendum Note (Signed)
Encounter addended by: Zola Button, RN on: 11/20/2017 4:28 PM  Actions taken: Visit Navigator Flowsheet section accepted, Order Reconciliation Section accessed, Medication List reviewed, Allergies reviewed

## 2017-11-20 NOTE — Progress Notes (Signed)
Alexandria Irwin presents today for what feels like a recurrent UTI. States her symptoms are slightly better today, but yesterday they were "unbearable". She has been using KY lubricant with her dilators and has noticed increased burning/irritation--she's worried she might have developed an allergic reaction to the product. Reports itching and burning in the perineal area. As well as frequency and urgency with urination, but very little urine when she does void. As taken the OTC product Azo before and states it did not give her any relief.   Vitals:   11/20/17 1038  BP: 139/79  Pulse: 78  Resp: 20  Temp: 98.4 F (36.9 C)  TempSrc: Oral  SpO2: 98%  Weight: 160 lb 2 oz (72.6 kg)  Height: 5\' 1"  (1.549 m)   Wt Readings from Last 3 Encounters:  11/20/17 160 lb 2 oz (72.6 kg)  11/13/17 158 lb (71.7 kg)  11/04/17 161 lb 12.8 oz (73.4 kg)

## 2017-11-22 LAB — URINE CULTURE: Culture: 20000 — AB

## 2017-11-25 ENCOUNTER — Other Ambulatory Visit: Payer: Self-pay | Admitting: Family

## 2017-11-25 ENCOUNTER — Other Ambulatory Visit: Payer: Self-pay | Admitting: Radiation Oncology

## 2017-11-25 NOTE — Telephone Encounter (Signed)
Last seen 08/26/17

## 2017-11-26 ENCOUNTER — Encounter: Payer: Self-pay | Admitting: Hematology and Oncology

## 2017-11-26 ENCOUNTER — Other Ambulatory Visit: Payer: Self-pay

## 2017-11-26 ENCOUNTER — Inpatient Hospital Stay: Payer: Medicare Other

## 2017-11-26 ENCOUNTER — Inpatient Hospital Stay (HOSPITAL_BASED_OUTPATIENT_CLINIC_OR_DEPARTMENT_OTHER): Payer: Medicare Other | Admitting: Hematology and Oncology

## 2017-11-26 ENCOUNTER — Other Ambulatory Visit: Payer: Self-pay | Admitting: Medical Oncology

## 2017-11-26 DIAGNOSIS — R300 Dysuria: Secondary | ICD-10-CM

## 2017-11-26 DIAGNOSIS — C541 Malignant neoplasm of endometrium: Secondary | ICD-10-CM | POA: Diagnosis not present

## 2017-11-26 DIAGNOSIS — Z5189 Encounter for other specified aftercare: Secondary | ICD-10-CM | POA: Diagnosis not present

## 2017-11-26 DIAGNOSIS — D6481 Anemia due to antineoplastic chemotherapy: Secondary | ICD-10-CM

## 2017-11-26 DIAGNOSIS — G620 Drug-induced polyneuropathy: Secondary | ICD-10-CM

## 2017-11-26 DIAGNOSIS — T451X5A Adverse effect of antineoplastic and immunosuppressive drugs, initial encounter: Secondary | ICD-10-CM

## 2017-11-26 DIAGNOSIS — E1165 Type 2 diabetes mellitus with hyperglycemia: Secondary | ICD-10-CM

## 2017-11-26 DIAGNOSIS — E119 Type 2 diabetes mellitus without complications: Secondary | ICD-10-CM

## 2017-11-26 DIAGNOSIS — Z5111 Encounter for antineoplastic chemotherapy: Secondary | ICD-10-CM | POA: Diagnosis not present

## 2017-11-26 DIAGNOSIS — R350 Frequency of micturition: Secondary | ICD-10-CM | POA: Diagnosis not present

## 2017-11-26 LAB — URINALYSIS, COMPLETE (UACMP) WITH MICROSCOPIC
Bilirubin Urine: NEGATIVE
GLUCOSE, UA: NEGATIVE mg/dL
Hgb urine dipstick: NEGATIVE
Ketones, ur: 5 mg/dL — AB
NITRITE: NEGATIVE
PH: 5 (ref 5.0–8.0)
Protein, ur: NEGATIVE mg/dL
Specific Gravity, Urine: 1.016 (ref 1.005–1.030)

## 2017-11-26 LAB — CBC WITH DIFFERENTIAL (CANCER CENTER ONLY)
BASOS ABS: 0 10*3/uL (ref 0.0–0.1)
Basophils Relative: 0 %
EOS ABS: 0 10*3/uL (ref 0.0–0.5)
EOS PCT: 0 %
HCT: 29.4 % — ABNORMAL LOW (ref 34.8–46.6)
Hemoglobin: 9.9 g/dL — ABNORMAL LOW (ref 11.6–15.9)
LYMPHS ABS: 1.2 10*3/uL (ref 0.9–3.3)
Lymphocytes Relative: 11 %
MCH: 33.2 pg (ref 25.1–34.0)
MCHC: 33.7 g/dL (ref 31.5–36.0)
MCV: 98.7 fL (ref 79.5–101.0)
Monocytes Absolute: 0.3 10*3/uL (ref 0.1–0.9)
Monocytes Relative: 3 %
Neutro Abs: 9.4 10*3/uL — ABNORMAL HIGH (ref 1.5–6.5)
Neutrophils Relative %: 86 %
Platelet Count: 148 10*3/uL (ref 145–400)
RBC: 2.98 MIL/uL — AB (ref 3.70–5.45)
RDW: 17.4 % — ABNORMAL HIGH (ref 11.2–14.5)
WBC: 10.9 10*3/uL — AB (ref 3.9–10.3)

## 2017-11-26 LAB — CMP (CANCER CENTER ONLY)
ALK PHOS: 50 U/L (ref 40–150)
ALT: 14 U/L (ref 0–55)
AST: 16 U/L (ref 5–34)
Albumin: 3.9 g/dL (ref 3.5–5.0)
Anion gap: 15 — ABNORMAL HIGH (ref 3–11)
BILIRUBIN TOTAL: 0.7 mg/dL (ref 0.2–1.2)
BUN: 19 mg/dL (ref 7–26)
CO2: 23 mmol/L (ref 22–29)
CREATININE: 0.94 mg/dL (ref 0.60–1.10)
Calcium: 9.4 mg/dL (ref 8.4–10.4)
Chloride: 103 mmol/L (ref 98–109)
GFR, EST NON AFRICAN AMERICAN: 58 mL/min — AB (ref >60)
GFR, Est AFR Am: >60 mL/min (ref >60)
Glucose, Bld: 128 mg/dL (ref 70–140)
Potassium: 4 mmol/L (ref 3.5–5.1)
Sodium: 141 mmol/L (ref 136–145)
TOTAL PROTEIN: 7 g/dL (ref 6.4–8.3)

## 2017-11-26 MED ORDER — DIPHENHYDRAMINE HCL 50 MG/ML IJ SOLN
INTRAMUSCULAR | Status: AC
  Filled 2017-11-26: qty 1

## 2017-11-26 MED ORDER — SODIUM CHLORIDE 0.9% FLUSH
10.0000 mL | Freq: Once | INTRAVENOUS | Status: AC
  Administered 2017-11-26: 10 mL
  Filled 2017-11-26: qty 10

## 2017-11-26 MED ORDER — SODIUM CHLORIDE 0.9% FLUSH
10.0000 mL | INTRAVENOUS | Status: DC | PRN
  Administered 2017-11-26: 10 mL
  Filled 2017-11-26: qty 10

## 2017-11-26 MED ORDER — HEPARIN SOD (PORK) LOCK FLUSH 100 UNIT/ML IV SOLN
500.0000 [IU] | Freq: Once | INTRAVENOUS | Status: AC | PRN
  Administered 2017-11-26: 500 [IU]
  Filled 2017-11-26: qty 5

## 2017-11-26 MED ORDER — FOSAPREPITANT DIMEGLUMINE INJECTION 150 MG
Freq: Once | INTRAVENOUS | Status: AC
  Administered 2017-11-26: 13:00:00 via INTRAVENOUS
  Filled 2017-11-26: qty 5

## 2017-11-26 MED ORDER — SODIUM CHLORIDE 0.9 % IV SOLN
175.0000 mg/m2 | Freq: Once | INTRAVENOUS | Status: AC
  Administered 2017-11-26: 324 mg via INTRAVENOUS
  Filled 2017-11-26: qty 54

## 2017-11-26 MED ORDER — PALONOSETRON HCL INJECTION 0.25 MG/5ML
INTRAVENOUS | Status: AC
  Filled 2017-11-26: qty 5

## 2017-11-26 MED ORDER — DIPHENHYDRAMINE HCL 50 MG/ML IJ SOLN
50.0000 mg | Freq: Once | INTRAMUSCULAR | Status: AC
  Administered 2017-11-26: 50 mg via INTRAVENOUS

## 2017-11-26 MED ORDER — PALONOSETRON HCL INJECTION 0.25 MG/5ML
0.2500 mg | Freq: Once | INTRAVENOUS | Status: AC
  Administered 2017-11-26: 0.25 mg via INTRAVENOUS

## 2017-11-26 MED ORDER — SODIUM CHLORIDE 0.9 % IV SOLN
520.0000 mg | Freq: Once | INTRAVENOUS | Status: AC
  Administered 2017-11-26: 520 mg via INTRAVENOUS
  Filled 2017-11-26: qty 52

## 2017-11-26 MED ORDER — FAMOTIDINE IN NACL 20-0.9 MG/50ML-% IV SOLN
INTRAVENOUS | Status: AC
Start: 2017-11-26 — End: ?
  Filled 2017-11-26: qty 50

## 2017-11-26 MED ORDER — FAMOTIDINE IN NACL 20-0.9 MG/50ML-% IV SOLN
20.0000 mg | Freq: Once | INTRAVENOUS | Status: AC
  Administered 2017-11-26: 20 mg via INTRAVENOUS

## 2017-11-26 MED ORDER — SODIUM CHLORIDE 0.9 % IV SOLN
Freq: Once | INTRAVENOUS | Status: AC
  Administered 2017-11-26: 12:00:00 via INTRAVENOUS

## 2017-11-26 NOTE — Assessment & Plan Note (Signed)
The patient had significant dexamethasone-induced hyperglycemia I recommend continue on reduced dose premedication dexamethasone and I discussed dietary modification around the time of the chemotherapy

## 2017-11-26 NOTE — Assessment & Plan Note (Signed)
she has mild peripheral neuropathy, likely related to side effects of treatment. It is only mild, not bothering the patient. I will observe for now If it gets worse in the future, I will consider modifying the dose of the treatment  

## 2017-11-26 NOTE — Assessment & Plan Note (Addendum)
She tolerated treatment well except for mild peripheral neuropathy and hyperglycemia I recommend continue on reduced premedication dexamethasone for future cycle We would proceed with treatment without dose adjustment I plan to repeat CT imaging within the month of last dose of chemotherapy before we get her port removed

## 2017-11-26 NOTE — Progress Notes (Signed)
[place] OFFICE PROGRESS NOTE  Patient Care Team: Sharion Balloon, FNP as PCP - General (Family Medicine)  ASSESSMENT & PLAN:  Endometrial adenocarcinoma [facility]) She tolerated treatment well except for mild peripheral neuropathy and hyperglycemia I recommend continue on reduced premedication dexamethasone for future cycle We would proceed with treatment without dose adjustment I plan to repeat CT imaging within the month of last dose of chemotherapy before we get her port removed  Peripheral neuropathy due to chemotherapy [facility]) she has mild peripheral neuropathy, likely related to side effects of treatment. It is only mild, not bothering the patient. I will observe for now If it gets worse in the future, I will consider modifying the dose of the treatment  Diabetes [facility]) The patient had significant dexamethasone-induced hyperglycemia I recommend continue on reduced dose premedication dexamethasone and I discussed dietary modification around the time of the chemotherapy  Anemia due to antineoplastic chemotherapy This is likely due to recent treatment. The patient denies recent history of bleeding such as epistaxis, hematuria or hematochezia. She is asymptomatic from the anemia. I will observe for now.    No orders of the defined types were placed in this encounter.   INTERVAL HISTORY: Please see below for problem oriented charting. She is seen prior to cycle 5 of chemotherapy She tolerated last dose well She denies progression of peripheral neuropathy Her blood sugar is stable with reduced dose oral dexamethasone She denies dysuria, frequency or urgency No recent nausea or changes in bowel habits with treatment Denies recent infection She was evaluated by radiation oncologist, repeat urine cultures pending She denies symptoms since she stopped using the dilator  SUMMARY OF ONCOLOGIC HISTORY: Oncology History   endometroid MSI by IHC showed loss of nuclear  expression, BRAF neg. Neg genetic testing     Endometrial adenocarcinoma ([place])   06/13/2017 Procedure    PROCEDURE: Hysteroscopy, D&C  SURGEON: Richard D. Deatra Ina M.D.  FINDINGS: Hypertrophic endometrial tissue, no polyps, no myoma         06/13/2017 Pathology Results    Endometrium, curettage ENDOMETRIOID ADENOCARCINOMA, FIGO GRADE 1      08/05/2017 Pathology Results    1. Lymph node, sentinel, biopsy, right external iliac - NO CARCINOMA IDENTIFIED IN ONE LYMPH NODE (0/1) - SEE COMMENT 2. Lymph node, sentinel, biopsy, left external iliac #1 - NO CARCINOMA IDENTIFIED IN ONE LYMPH NODE (0/1) - SEE COMMENT 3. Lymph node, sentinel, biopsy, left external iliac #2 - NO CARCINOMA IDENTIFIED IN ONE LYMPH NODE (0/1) - SEE COMMENT 4. Uterus +/- tubes/ovaries, neoplastic, ovaries UTERUS: - ENDOMETRIOID CARCINOMA, FIGO GRADE 2, INVOLVING THE ENTIRE POSTERIOR MYOMETRIUM WITH TUMOR PRESENT AT INKED SEROSAL SURFACE - SEE ONCOLOGY TABLE BELOW CERVIX: - NABOTHIAN CYSTS - NO DYSPLASIA OR MALIGNANCY IDENTIFIED BILATERAL OVARIES: - NO CARCINOMA IDENTIFIED BILATERAL FALLOPIAN TUBES: - ENDOMETRIOID CARCINOMA INVOLVING LEFT FALLOPIAN TUBE  1. , 2 and 3. Cytokeratin AE1/3 was performed on the sentinel lymph nodes to exclude micrometastasis; they are negative. 4. ONCOLOGY TABLE-UTERUS, CARCINOMA OR CARCINOSARCOMA Specimen: Uterus, bilateral fallopian tubes and ovaries Procedure: Total hysterectomy and bilateral salpingo-oophorectomy Histologic type: Endometrioid carcinoma, NOS Grade: FIGO Grade 2 Myometrial invasion: >50% (The posterior myometrium has full thickness involvement with tumor extending to the inked serosal surface) Uterine Serosa involvement: Present Cervical stromal involvement: Not identified Other Tissue/Organ Involvement: Left fallopian tube Lymphovascular invasion: Not identified Peritoneal washings: Not submitted/unknown Lymph nodes: Examined: 3 Sentinel 0  Non-sentinel 3 Total Lymph nodes with metastasis: 0 Isolated tumor cells (< 0.2  mm): 0 Micrometastasis: (> 0.2 mm and < 2.0 mm): 0 Macrometastasis: (> 2.0 mm): 0 Extracapsular extension: N/A TNM code: pT3a, pN0 FIGO Stage (based on pathologic findings, needs clinical correlation): IIIa *Block 4I is representative of the tumor and suitable for additional studies, if requested. COMMENT: MMR (by immunohistochemistry) is pending and will be reported in an addendum.      08/05/2017 Surgery    Operation: Robotic-assisted laparoscopic total hysterectomy with bilateral salpingoophorectomy, SLN biopsy, umbilical hernia  Surgeon: Donaciano Eva  Operative Findings:  : 6cm normal appearing uterus, normal right tube and ovary, left ovary with cystic mass, left sigmoid colon diverticular disease, adhesions to left ovary.  No gross extrauterine disease.         08/14/2017 Imaging    1. Interval hysterectomy. No findings of recurrent or metastatic disease. 2. Small volume pelvic fluid and a presumed postoperative seroma or lymphangioma within the left hemipelvis. 3. Subtle irregular hepatic capsule is suspicious for mild cirrhosis. Correlate with risk factors. 4. Coronary artery atherosclerosis. Aortic Atherosclerosis (ICD10-I70.0).      08/27/2017 Procedure    Successful placement of a right internal jugular approach power injectable Port-A-Cath. The catheter is ready for immediate use.      09/02/2017 -  Chemotherapy    The patient had carboplatin and Taxol      09/11/2017 Genetic Testing    Patient has genetic testing done for: The genes analyzed were the 83 genes on [employer]'s Multi-Cancer panel (ALK, APC, ATM, AXIN2, BAP1, BARD1, BLM, BMPR1A, BRCA1, BRCA2, BRIP1, CASR, CDC73, CDH1, CDK4, CDKN1B, CDKN1C, CDKN2A, CEBPA, CHEK2, CTNNA1, DICER1, DIS3L2, EGFR, EPCAM, FH, FLCN, GATA2, GPC3, GREM1, HOXB13, HRAS, KIT, MAX, MEN1, MET, MITF, MLH1, MSH2, MSH3, MSH6, MUTYH, NBN, NF1, NF2, NTHL1,  PALB2, PDGFRA, PHOX2B, PMS2, POLD1, POLE, POT1, PRKAR1A, PTCH1, PTEN, RAD50, RAD51C, RAD51D, RB1, RECQL4, RET, RUNX1, SDHA, SDHAF2, SDHB, SDHC, SDHD, SMAD4, SMARCA4, SMARCB1, SMARCE1, STK11, SUFU, TERC, TERT, TMEM127, TP53, TSC1, TSC2, VHL, WRN, WT1). Results revealed patient has no detectable mutation      09/18/2017 - 10/13/2017 Radiation Therapy    Radiation treatment dates:  09/18/17-10/13/17  Site/dose:  Vaginal cuff/ 30 Gy in 5 fractions  Beams/energy:  HDR Ir-192 Vaginal/ Iridium-192        REVIEW OF SYSTEMS:   Constitutional: Denies fevers, chills or abnormal weight loss Eyes: Denies blurriness of vision Ears, nose, mouth, throat, and face: Denies mucositis or sore throat Respiratory: Denies cough, dyspnea or wheezes Cardiovascular: Denies palpitation, chest discomfort or lower extremity swelling Gastrointestinal:  Denies nausea, heartburn or change in bowel habits Skin: Denies abnormal skin rashes Lymphatics: Denies new lymphadenopathy or easy bruising Neurological:Denies numbness, tingling or new weaknesses Behavioral/Psych: Mood is stable, no new changes  All other systems were reviewed with the patient and are negative.  I have reviewed the past medical history, past surgical history, social history and family history with the patient and they are unchanged from previous note.  ALLERGIES:  is allergic to ace inhibitors; norvasc [amlodipine]; latex; neosporin [neomycin-bacitracin zn-polymyx]; and sulfa antibiotics.  MEDICATIONS:  Current Outpatient Medications  Medication Sig Dispense Refill  . acetaminophen (TYLENOL) 500 MG tablet Take 1,000 mg by mouth every 8 (eight) hours as needed for mild pain or headache.    . albuterol (PROVENTIL HFA;VENTOLIN HFA) 108 (90 BASE) MCG/ACT inhaler Inhale 2 puffs into the lungs every 6 (six) hours as needed for wheezing or shortness of breath. 1 Inhaler 1  . aspirin EC 81 MG tablet Take 81 mg by  mouth daily.    . Calcium  Citrate-Vitamin D (CVS CALCIUM CITRATE +D PO) Take 2 tablets by mouth every evening.     . Cholecalciferol (D 2000) 2000 units TABS Take 2,000 Units by mouth daily.    . cloNIDine (CATAPRES) 0.1 MG tablet TAKE 1 TABLET BY MOUTH TWICE DAILY 180 tablet 0  . cyanocobalamin (,VITAMIN B-12,) 1000 MCG/ML injection Inject 1,000 mcg into the muscle every Thursday.    . cycloSPORINE (RESTASIS) 0.05 % ophthalmic emulsion Place 1 drop into both eyes 2 (two) times daily.     Marland Kitchen dexamethasone (DECADRON) 4 MG tablet Take 3 tabs at night and 3 tabs at 6 am the morning of chemotherapy, every 3 weeks with food 36 tablet 0  . Famotidine (PEPCID AC PO) Take by mouth every morning.    . furosemide (LASIX) 40 MG tablet TAKE 1 TABLET BY MOUTH ONCE DAILY 90 tablet 0  . JANUVIA 50 MG tablet TAKE 1 TABLET BY MOUTH ONCE DAILY 90 tablet 0  . levothyroxine (SYNTHROID, LEVOTHROID) 100 MCG tablet TAKE 1 TABLET BY MOUTH ONCE DAILY 90 tablet 2  . lidocaine-prilocaine (EMLA) cream Apply to affected area once 30 g 3  . losartan (COZAAR) 100 MG tablet Take 1 tablet (100 mg total) by mouth daily. 90 tablet 0  . Magnesium 250 MG TABS Take 250 mg by mouth every evening.     . meclizine (ANTIVERT) 32 MG tablet Take 1 tablet (32 mg total) by mouth 3 (three) times daily as needed for dizziness. 30 tablet 0  . meclizine (ANTIVERT) 50 MG tablet Take 1 tablet (50 mg total) by mouth 2 (two) times daily as needed. 30 tablet 0  . metFORMIN (GLUCOPHAGE) 500 MG tablet TAKE 2 TABLETS BY MOUTH IN THE MORNING AND 1 IN THE EVENING 270 tablet 0  . metoprolol succinate (TOPROL-XL) 50 MG 24 hr tablet TAKE ONE TABLET BY MOUTH TWICE DAILY WITH  OR  IMMEDIATELY  FOLLOWING  A  MEAL 180 tablet 4  . nitrofurantoin, macrocrystal-monohydrate, (MACROBID) 100 MG capsule Take 1 capsule (100 mg total) by mouth 2 (two) times daily. Take for 5 days. 10 capsule 0  . Omega-3 Fatty Acids (FISH OIL) 1000 MG CAPS Take 1,000 mg by mouth every evening.    . ondansetron  (ZOFRAN) 8 MG tablet Take 1 tablet (8 mg total) by mouth 2 (two) times daily as needed for refractory nausea / vomiting. Start on day 3 after chemo. 30 tablet 1  . ONETOUCH DELICA LANCETS 23R MISC USE 1 TO CHECK GLUCOSE ONCE DAILY 100 each 1  . ONETOUCH VERIO test strip USE TO CHECK GLUCOSE DAILY 100 each 3  . prochlorperazine (COMPAZINE) 10 MG tablet Take 1 tablet (10 mg total) by mouth every 6 (six) hours as needed (Nausea or vomiting). 30 tablet 1  . simvastatin (ZOCOR) 40 MG tablet TAKE 1 TABLET BY MOUTH ONCE DAILY AT 6PM 90 tablet 0  . vitamin C (ASCORBIC ACID) 500 MG tablet Take 500 mg by mouth daily.     Current Facility-Administered Medications  Medication Dose Route Frequency Provider Last Rate Last Dose  . cyanocobalamin ((VITAMIN B-12)) injection 1,000 mcg  1,000 mcg Intramuscular Daily Evelina Dun A, FNP   1,000 mcg at 10/28/17 0076    PHYSICAL EXAMINATION: ECOG PERFORMANCE STATUS: 1 - Symptomatic but completely ambulatory GENERAL:alert, no distress and comfortable SKIN: skin color, texture, turgor are normal, no rashes or significant lesions EYES: normal, Conjunctiva are pink and non-injected, sclera clear OROPHARYNX:no exudate,  no erythema and lips, buccal mucosa, and tongue normal  NECK: supple, thyroid normal size, non-tender, without nodularity LYMPH:  no palpable lymphadenopathy in the cervical, axillary or inguinal LUNGS: clear to auscultation and percussion with normal breathing effort HEART: regular rate & rhythm and no murmurs and no lower extremity edema ABDOMEN:abdomen soft, non-tender and normal bowel sounds Musculoskeletal:no cyanosis of digits and no clubbing  NEURO: alert & oriented x 3 with fluent speech, no focal motor/sensory deficits  LABORATORY DATA:  I have reviewed the data as listed    Component Value Date/Time   NA 138 11/04/2017 0931   NA 142 08/26/2017 0908   K 4.4 11/04/2017 0931   CL 103 11/04/2017 0931   CO2 25 11/04/2017 0931   GLUCOSE  165 (H) 11/04/2017 0931   BUN 15 11/04/2017 0931   BUN 12 08/26/2017 0908   CREATININE 0.96 11/04/2017 0931   CALCIUM 9.6 11/04/2017 0931   PROT 6.8 11/04/2017 0931   PROT 6.6 08/26/2017 0908   ALBUMIN 3.7 11/04/2017 0931   ALBUMIN 4.2 08/26/2017 0908   AST 18 11/04/2017 0931   ALT 16 11/04/2017 0931   ALKPHOS 50 11/04/2017 0931   BILITOT 0.6 11/04/2017 0931   GFRNONAA 57 (L) 11/04/2017 0931   GFRAA >60 11/04/2017 0931    No results found for: SPEP, UPEP  Lab Results  Component Value Date   WBC 10.9 (H) 11/26/2017   NEUTROABS 9.4 (H) 11/26/2017   HGB 9.9 (L) 11/26/2017   HCT 29.4 (L) 11/26/2017   MCV 98.7 11/26/2017   PLT 148 11/26/2017      Chemistry      Component Value Date/Time   NA 138 11/04/2017 0931   NA 142 08/26/2017 0908   K 4.4 11/04/2017 0931   CL 103 11/04/2017 0931   CO2 25 11/04/2017 0931   BUN 15 11/04/2017 0931   BUN 12 08/26/2017 0908   CREATININE 0.96 11/04/2017 0931      Component Value Date/Time   CALCIUM 9.6 11/04/2017 0931   ALKPHOS 50 11/04/2017 0931   AST 18 11/04/2017 0931   ALT 16 11/04/2017 0931   BILITOT 0.6 11/04/2017 0931       All questions were answered. The patient knows to call the clinic with any problems, questions or concerns. No barriers to learning was detected.  I spent 15 minutes counseling the patient face to face. The total time spent in the appointment was 20 minutes and more than 50% was on counseling and review of test results  Heath Lark, MD 11/26/2017 10:36 AM

## 2017-11-26 NOTE — Patient Instructions (Signed)
[facility] Discharge Instructions for Patients Receiving Chemotherapy  Today you received the following chemotherapy agents:  Taxol & Carboplatin  To help prevent nausea and vomiting after your treatment, we encourage you to take your nausea medication.   If you develop nausea and vomiting that is not controlled by your nausea medication, call the clinic.   BELOW ARE SYMPTOMS THAT SHOULD BE REPORTED IMMEDIATELY:  *FEVER GREATER THAN 100.5 F  *CHILLS WITH OR WITHOUT FEVER  NAUSEA AND VOMITING THAT IS NOT CONTROLLED WITH YOUR NAUSEA MEDICATION  *UNUSUAL SHORTNESS OF BREATH  *UNUSUAL BRUISING OR BLEEDING  TENDERNESS IN MOUTH AND THROAT WITH OR WITHOUT PRESENCE OF ULCERS  *URINARY PROBLEMS  *BOWEL PROBLEMS  UNUSUAL RASH Items with * indicate a potential emergency and should be followed up as soon as possible.  Feel free to call the clinic should you have any questions or concerns. The clinic phone number is [phone].  Please show the CHEMO ALERT CARD at check-in to the Emergency Department and triage nurse.   

## 2017-11-26 NOTE — Assessment & Plan Note (Signed)
This is likely due to recent treatment. The patient denies recent history of bleeding such as epistaxis, hematuria or hematochezia. She is asymptomatic from the anemia. I will observe for now.   

## 2017-11-27 ENCOUNTER — Other Ambulatory Visit: Payer: Self-pay | Admitting: Radiation Oncology

## 2017-11-27 MED ORDER — AMOXICILLIN-POT CLAVULANATE 875-125 MG PO TABS: 1.0000 | ORAL_TABLET | Freq: Two times a day (BID) | ORAL | 0 refills | Status: DC

## 2017-11-28 ENCOUNTER — Ambulatory Visit (INDEPENDENT_AMBULATORY_CARE_PROVIDER_SITE_OTHER): Payer: Medicare Other | Admitting: *Deleted

## 2017-11-28 DIAGNOSIS — E538 Deficiency of other specified B group vitamins: Secondary | ICD-10-CM

## 2017-11-28 LAB — URINE CULTURE: Culture: >=100000 — AB

## 2017-11-28 NOTE — Progress Notes (Signed)
Pt given Cyanocobalamin inj Tolerated well 

## 2017-11-29 ENCOUNTER — Inpatient Hospital Stay: Payer: Medicare Other | Attending: Hematology and Oncology

## 2017-11-29 VITALS — BP 122/81 | HR 75 | Temp 98.1°F | Resp 18

## 2017-11-29 DIAGNOSIS — Z5189 Encounter for other specified aftercare: Secondary | ICD-10-CM | POA: Diagnosis not present

## 2017-11-29 DIAGNOSIS — C541 Malignant neoplasm of endometrium: Secondary | ICD-10-CM | POA: Insufficient documentation

## 2017-11-29 DIAGNOSIS — Z5111 Encounter for antineoplastic chemotherapy: Secondary | ICD-10-CM | POA: Diagnosis not present

## 2017-11-29 MED ORDER — PEGFILGRASTIM-CBQV 6 MG/0.6ML ~~LOC~~ SOSY
PREFILLED_SYRINGE | SUBCUTANEOUS | Status: AC
  Filled 2017-11-29: qty 0.6

## 2017-11-29 MED ORDER — PEGFILGRASTIM-CBQV 6 MG/0.6ML ~~LOC~~ SOSY
6.0000 mg | PREFILLED_SYRINGE | Freq: Once | SUBCUTANEOUS | Status: AC
  Administered 2017-11-29: 6 mg via SUBCUTANEOUS

## 2017-11-29 NOTE — Patient Instructions (Signed)
Pegfilgrastim injection What is this medicine? PEGFILGRASTIM (PEG fil gra stim) is a long-acting granulocyte colony-stimulating factor that stimulates the growth of neutrophils, a type of white blood cell important in the body's fight against infection. It is used to reduce the incidence of fever and infection in patients with certain types of cancer who are receiving chemotherapy that affects the bone marrow, and to increase survival after being exposed to high doses of radiation. This medicine may be used for other purposes; ask your health care provider or pharmacist if you have questions. COMMON BRAND NAME(S): Neulasta What should I tell my health care provider before I take this medicine? They need to know if you have any of these conditions: -kidney disease -latex allergy -ongoing radiation therapy -sickle cell disease -skin reactions to acrylic adhesives (On-Body Injector only) -an unusual or allergic reaction to pegfilgrastim, filgrastim, other medicines, foods, dyes, or preservatives -pregnant or trying to get pregnant -breast-feeding How should I use this medicine? This medicine is for injection under the skin. If you get this medicine at home, you will be taught how to prepare and give the pre-filled syringe or how to use the On-body Injector. Refer to the patient Instructions for Use for detailed instructions. Use exactly as directed. Tell your healthcare provider immediately if you suspect that the On-body Injector may not have performed as intended or if you suspect the use of the On-body Injector resulted in a missed or partial dose. It is important that you put your used needles and syringes in a special sharps container. Do not put them in a trash can. If you do not have a sharps container, call your pharmacist or healthcare provider to get one. Talk to your pediatrician regarding the use of this medicine in children. While this drug may be prescribed for selected conditions,  precautions do apply. Overdosage: If you think you have taken too much of this medicine contact a poison control center or emergency room at once. NOTE: This medicine is only for you. Do not share this medicine with others. What if I miss a dose? It is important not to miss your dose. Call your doctor or health care professional if you miss your dose. If you miss a dose due to an On-body Injector failure or leakage, a new dose should be administered as soon as possible using a single prefilled syringe for manual use. What may interact with this medicine? Interactions have not been studied. Give your health care provider a list of all the medicines, herbs, non-prescription drugs, or dietary supplements you use. Also tell them if you smoke, drink alcohol, or use illegal drugs. Some items may interact with your medicine. This list may not describe all possible interactions. Give your health care provider a list of all the medicines, herbs, non-prescription drugs, or dietary supplements you use. Also tell them if you smoke, drink alcohol, or use illegal drugs. Some items may interact with your medicine. What should I watch for while using this medicine? You may need blood work done while you are taking this medicine. If you are going to need a MRI, CT scan, or other procedure, tell your doctor that you are using this medicine (On-Body Injector only). What side effects may I notice from receiving this medicine? Side effects that you should report to your doctor or health care professional as soon as possible: -allergic reactions like skin rash, itching or hives, swelling of the face, lips, or tongue -dizziness -fever -pain, redness, or irritation at site   where injected -pinpoint red spots on the skin -red or dark-brown urine -shortness of breath or breathing problems -stomach or side pain, or pain at the shoulder -swelling -tiredness -trouble passing urine or change in the amount of urine Side  effects that usually do not require medical attention (report to your doctor or health care professional if they continue or are bothersome): -bone pain -muscle pain This list may not describe all possible side effects. Call your doctor for medical advice about side effects. You may report side effects to FDA at 1-800-FDA-1088. Where should I keep my medicine? Keep out of the reach of children. Store pre-filled syringes in a refrigerator between 2 and 8 degrees C (36 and 46 degrees F). Do not freeze. Keep in carton to protect from light. Throw away this medicine if it is left out of the refrigerator for more than 48 hours. Throw away any unused medicine after the expiration date. NOTE: This sheet is a summary. It may not cover all possible information. If you have questions about this medicine, talk to your doctor, pharmacist, or health care provider.  2018 Elsevier/Gold Standard (2016-06-13 12:58:03)  

## 2017-12-08 ENCOUNTER — Other Ambulatory Visit: Payer: Self-pay | Admitting: Family

## 2017-12-16 ENCOUNTER — Encounter: Payer: Self-pay | Admitting: Hematology and Oncology

## 2017-12-16 ENCOUNTER — Inpatient Hospital Stay: Payer: Medicare Other

## 2017-12-16 ENCOUNTER — Inpatient Hospital Stay (HOSPITAL_BASED_OUTPATIENT_CLINIC_OR_DEPARTMENT_OTHER): Payer: Medicare Other | Admitting: Hematology and Oncology

## 2017-12-16 ENCOUNTER — Telehealth: Payer: Self-pay | Admitting: Hematology and Oncology

## 2017-12-16 VITALS — BP 119/70 | HR 76 | Temp 98.3°F | Resp 18 | Ht 61.0 in | Wt 153.8 lb

## 2017-12-16 DIAGNOSIS — C541 Malignant neoplasm of endometrium: Secondary | ICD-10-CM

## 2017-12-16 DIAGNOSIS — Z5111 Encounter for antineoplastic chemotherapy: Secondary | ICD-10-CM | POA: Diagnosis not present

## 2017-12-16 DIAGNOSIS — G620 Drug-induced polyneuropathy: Secondary | ICD-10-CM | POA: Diagnosis not present

## 2017-12-16 DIAGNOSIS — Z5189 Encounter for other specified aftercare: Secondary | ICD-10-CM | POA: Diagnosis not present

## 2017-12-16 DIAGNOSIS — D61818 Other pancytopenia: Secondary | ICD-10-CM | POA: Insufficient documentation

## 2017-12-16 DIAGNOSIS — T451X5A Adverse effect of antineoplastic and immunosuppressive drugs, initial encounter: Secondary | ICD-10-CM

## 2017-12-16 LAB — CBC WITH DIFFERENTIAL (CANCER CENTER ONLY)
BASOS ABS: 0 10*3/uL (ref 0.0–0.1)
BASOS PCT: 0 %
EOS ABS: 0 10*3/uL (ref 0.0–0.5)
EOS PCT: 0 %
HCT: 27.8 % — ABNORMAL LOW (ref 34.8–46.6)
HEMOGLOBIN: 9.4 g/dL — AB (ref 11.6–15.9)
LYMPHS ABS: 1 10*3/uL (ref 0.9–3.3)
Lymphocytes Relative: 12 %
MCH: 34.6 pg — ABNORMAL HIGH (ref 25.1–34.0)
MCHC: 33.8 g/dL (ref 31.5–36.0)
MCV: 102.2 fL — ABNORMAL HIGH (ref 79.5–101.0)
Monocytes Absolute: 0.1 10*3/uL (ref 0.1–0.9)
Monocytes Relative: 2 %
NEUTROS PCT: 86 %
Neutro Abs: 6.7 10*3/uL — ABNORMAL HIGH (ref 1.5–6.5)
Platelet Count: 115 10*3/uL — ABNORMAL LOW (ref 145–400)
RBC: 2.72 MIL/uL — AB (ref 3.70–5.45)
RDW: 16.3 % — ABNORMAL HIGH (ref 11.2–14.5)
WBC: 7.8 10*3/uL (ref 3.9–10.3)

## 2017-12-16 LAB — CMP (CANCER CENTER ONLY)
ALT: 15 U/L (ref 0–55)
AST: 21 U/L (ref 5–34)
Albumin: 3.9 g/dL (ref 3.5–5.0)
Alkaline Phosphatase: 60 U/L (ref 40–150)
Anion gap: 14 — ABNORMAL HIGH (ref 3–11)
BILIRUBIN TOTAL: 0.5 mg/dL (ref 0.2–1.2)
BUN: 21 mg/dL (ref 7–26)
CALCIUM: 9.2 mg/dL (ref 8.4–10.4)
CHLORIDE: 104 mmol/L (ref 98–109)
CO2: 25 mmol/L (ref 22–29)
CREATININE: 0.94 mg/dL (ref 0.60–1.10)
GFR, EST NON AFRICAN AMERICAN: 58 mL/min — AB (ref >60)
Glucose, Bld: 140 mg/dL (ref 70–140)
Potassium: 4.2 mmol/L (ref 3.5–5.1)
Sodium: 143 mmol/L (ref 136–145)
TOTAL PROTEIN: 7 g/dL (ref 6.4–8.3)

## 2017-12-16 MED ORDER — PALONOSETRON HCL INJECTION 0.25 MG/5ML
INTRAVENOUS | Status: AC
  Filled 2017-12-16: qty 5

## 2017-12-16 MED ORDER — SODIUM CHLORIDE 0.9% FLUSH
10.0000 mL | INTRAVENOUS | Status: DC | PRN
  Administered 2017-12-16: 10 mL
  Filled 2017-12-16: qty 10

## 2017-12-16 MED ORDER — HEPARIN SOD (PORK) LOCK FLUSH 100 UNIT/ML IV SOLN
500.0000 [IU] | Freq: Once | INTRAVENOUS | Status: AC | PRN
  Administered 2017-12-16: 500 [IU]
  Filled 2017-12-16: qty 5

## 2017-12-16 MED ORDER — SODIUM CHLORIDE 0.9 % IV SOLN
175.0000 mg/m2 | Freq: Once | INTRAVENOUS | Status: AC
  Administered 2017-12-16: 324 mg via INTRAVENOUS
  Filled 2017-12-16: qty 54

## 2017-12-16 MED ORDER — DIPHENHYDRAMINE HCL 50 MG/ML IJ SOLN
50.0000 mg | Freq: Once | INTRAMUSCULAR | Status: AC
  Administered 2017-12-16: 50 mg via INTRAVENOUS

## 2017-12-16 MED ORDER — FAMOTIDINE IN NACL 20-0.9 MG/50ML-% IV SOLN
20.0000 mg | Freq: Once | INTRAVENOUS | Status: AC
  Administered 2017-12-16: 20 mg via INTRAVENOUS

## 2017-12-16 MED ORDER — SODIUM CHLORIDE 0.9 % IV SOLN
Freq: Once | INTRAVENOUS | Status: AC
  Administered 2017-12-16: 12:00:00 via INTRAVENOUS

## 2017-12-16 MED ORDER — SODIUM CHLORIDE 0.9 % IV SOLN
520.0000 mg | Freq: Once | INTRAVENOUS | Status: AC
  Administered 2017-12-16: 520 mg via INTRAVENOUS
  Filled 2017-12-16: qty 52

## 2017-12-16 MED ORDER — FAMOTIDINE IN NACL 20-0.9 MG/50ML-% IV SOLN
INTRAVENOUS | Status: AC
  Filled 2017-12-16: qty 50

## 2017-12-16 MED ORDER — DIPHENHYDRAMINE HCL 50 MG/ML IJ SOLN
INTRAMUSCULAR | Status: AC
  Filled 2017-12-16: qty 1

## 2017-12-16 MED ORDER — SODIUM CHLORIDE 0.9 % IV SOLN
Freq: Once | INTRAVENOUS | Status: AC
  Administered 2017-12-16: 12:00:00 via INTRAVENOUS
  Filled 2017-12-16: qty 5

## 2017-12-16 MED ORDER — PALONOSETRON HCL INJECTION 0.25 MG/5ML
0.2500 mg | Freq: Once | INTRAVENOUS | Status: AC
  Administered 2017-12-16: 0.25 mg via INTRAVENOUS

## 2017-12-16 MED ORDER — SODIUM CHLORIDE 0.9% FLUSH
10.0000 mL | Freq: Once | INTRAVENOUS | Status: AC
  Administered 2017-12-16: 10 mL
  Filled 2017-12-16: qty 10

## 2017-12-16 NOTE — Assessment & Plan Note (Signed)
This is due to chemotherapy side effects She is not symptomatic We will proceed with treatment without dose adjustment I plan to repeat blood work again in the month

## 2017-12-16 NOTE — Assessment & Plan Note (Signed)
she has mild peripheral neuropathy, likely related to side effects of treatment. It is only mild, not bothering the patient. I will observe for now 

## 2017-12-16 NOTE — Progress Notes (Signed)
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 It is only mild, not bothering the patient. I will observe for now  Pancytopenia, acquired [facility]) This is due to chemotherapy side effects She is not symptomatic We will proceed with treatment without dose adjustment I plan to repeat blood work again in the month   Orders Placed This Encounter  Procedures  . CT ABDOMEN PELVIS W CONTRAST    Standing Status:   Future    Standing Expiration Date:   12/17/2018    Order Specific Question:   If indicated for the ordered procedure, I authorize the administration of contrast media per Radiology protocol    Answer:   Yes    Order Specific Question:   Preferred imaging location?    Answer:   [facility]    Order Specific Question:   Radiology Contrast Protocol - do NOT remove file path    Answer:   [email or web address]    INTERVAL HISTORY: Please see below for problem oriented charting. She returns for cycle 6 of chemotherapy She tolerated last cycle of therapy well She has mildly elevated blood sugar Her neuropathy is intermittent in nature but not worse She denies recent infection The patient denies any recent signs or symptoms of bleeding such as spontaneous epistaxis, hematuria or hematochezia.  SUMMARY OF ONCOLOGIC HISTORY: Oncology History   endometroid MSI by IHC showed loss of nuclear expression, BRAF neg. Neg genetic testing     Endometrial  adenocarcinoma ([place])   06/13/2017 Procedure    PROCEDURE: Hysteroscopy, D&C  SURGEON: Richard D. Deatra Ina M.D.  FINDINGS: Hypertrophic endometrial tissue, no polyps, no myoma         06/13/2017 Pathology Results    Endometrium, curettage ENDOMETRIOID ADENOCARCINOMA, FIGO GRADE 1      08/05/2017 Pathology Results    1. Lymph node, sentinel, biopsy, right external iliac - NO CARCINOMA IDENTIFIED IN ONE LYMPH NODE (0/1) - SEE COMMENT 2. Lymph node, sentinel, biopsy, left external iliac #1 - NO CARCINOMA IDENTIFIED IN ONE LYMPH NODE (0/1) - SEE COMMENT 3. Lymph node, sentinel, biopsy, left external iliac #2 - NO CARCINOMA IDENTIFIED IN ONE LYMPH NODE (0/1) - SEE COMMENT 4. Uterus +/- tubes/ovaries, neoplastic, ovaries UTERUS: - ENDOMETRIOID CARCINOMA, FIGO GRADE 2, INVOLVING THE ENTIRE POSTERIOR MYOMETRIUM WITH TUMOR PRESENT AT INKED SEROSAL SURFACE - SEE ONCOLOGY TABLE BELOW CERVIX: - NABOTHIAN CYSTS - NO DYSPLASIA OR MALIGNANCY IDENTIFIED BILATERAL OVARIES: - NO CARCINOMA IDENTIFIED BILATERAL FALLOPIAN TUBES: - ENDOMETRIOID CARCINOMA INVOLVING LEFT FALLOPIAN TUBE  1. , 2 and 3. Cytokeratin AE1/3 was performed on the sentinel lymph nodes to exclude micrometastasis; they are negative. 4. ONCOLOGY TABLE-UTERUS, CARCINOMA OR CARCINOSARCOMA Specimen: Uterus, bilateral fallopian tubes and ovaries Procedure: Total hysterectomy and bilateral salpingo-oophorectomy Histologic type: Endometrioid carcinoma, NOS Grade: FIGO Grade 2 Myometrial invasion: >50% (The posterior myometrium has full thickness involvement with tumor extending to the inked serosal surface) Uterine Serosa involvement: Present Cervical stromal involvement: Not identified Other Tissue/Organ Involvement: Left fallopian tube Lymphovascular invasion: Not identified Peritoneal washings: Not submitted/unknown Lymph nodes:  Examined: 3 Sentinel 0 Non-sentinel 3 Total Lymph nodes with metastasis: 0 Isolated tumor  cells (< 0.2 mm): 0 Micrometastasis: (> 0.2 mm and < 2.0 mm): 0 Macrometastasis: (> 2.0 mm): 0 Extracapsular extension: N/A TNM code: pT3a, pN0 FIGO Stage (based on pathologic findings, needs clinical correlation): IIIa *Block 4I is representative of the tumor and suitable for additional studies, if requested. COMMENT: MMR (by immunohistochemistry) is pending and will be reported in an addendum.      08/05/2017 Surgery    Operation: Robotic-assisted laparoscopic total hysterectomy with bilateral salpingoophorectomy, SLN biopsy, umbilical hernia  Surgeon: Donaciano Eva  Operative Findings:  : 6cm normal appearing uterus, normal right tube and ovary, left ovary with cystic mass, left sigmoid colon diverticular disease, adhesions to left ovary.  No gross extrauterine disease.         08/14/2017 Imaging    1. Interval hysterectomy. No findings of recurrent or metastatic disease. 2. Small volume pelvic fluid and a presumed postoperative seroma or lymphangioma within the left hemipelvis. 3. Subtle irregular hepatic capsule is suspicious for mild cirrhosis. Correlate with risk factors. 4. Coronary artery atherosclerosis. Aortic Atherosclerosis (ICD10-I70.0).      08/27/2017 Procedure    Successful placement of a right internal jugular approach power injectable Port-A-Cath. The catheter is ready for immediate use.      09/02/2017 -  Chemotherapy    The patient had carboplatin and Taxol      09/11/2017 Genetic Testing    Patient has genetic testing done for: The genes analyzed were the 83 genes on [facility]'s Multi-Cancer panel (ALK, APC, ATM, AXIN2, BAP1, BARD1, BLM, BMPR1A, BRCA1, BRCA2, BRIP1, CASR, CDC73, CDH1, CDK4, CDKN1B, CDKN1C, CDKN2A, CEBPA, CHEK2, CTNNA1, DICER1, DIS3L2, EGFR, EPCAM, FH, FLCN, GATA2, GPC3, GREM1, HOXB13, HRAS, KIT, MAX, MEN1, MET, MITF, MLH1, MSH2, MSH3, MSH6, MUTYH, NBN, NF1, NF2, NTHL1, PALB2, PDGFRA, PHOX2B, PMS2, POLD1, POLE, POT1, PRKAR1A, PTCH1, PTEN,  RAD50, RAD51C, RAD51D, RB1, RECQL4, RET, RUNX1, SDHA, SDHAF2, SDHB, SDHC, SDHD, SMAD4, SMARCA4, SMARCB1, SMARCE1, STK11, SUFU, TERC, TERT, TMEM127, TP53, TSC1, TSC2, VHL, WRN, WT1). Results revealed patient has no detectable mutation      09/18/2017 - 10/13/2017 Radiation Therapy    Radiation treatment dates:  09/18/17-10/13/17  Site/dose:  Vaginal cuff/ 30 Gy in 5 fractions  Beams/energy:  HDR Ir-192 Vaginal/ Iridium-192        REVIEW OF SYSTEMS:   Constitutional: Denies fevers, chills or abnormal weight loss Eyes: Denies blurriness of vision Ears, nose, mouth, throat, and face: Denies mucositis or sore throat Respiratory: Denies cough, dyspnea or wheezes Cardiovascular: Denies palpitation, chest discomfort or lower extremity swelling Gastrointestinal:  Denies nausea, heartburn or change in bowel habits Skin: Denies abnormal skin rashes Lymphatics: Denies new lymphadenopathy or easy bruising Neurological:Denies numbness, tingling or new weaknesses Behavioral/Psych: Mood is stable, no new changes  All other systems were reviewed with the patient and are negative.  I have reviewed the past medical history, past surgical history, social history and family history with the patient and they are unchanged from previous note.  ALLERGIES:  is allergic to ace inhibitors; norvasc [amlodipine]; latex; neosporin [neomycin-bacitracin zn-polymyx]; and sulfa antibiotics.  MEDICATIONS:  Current Outpatient Medications  Medication Sig Dispense Refill  . acetaminophen (TYLENOL) 500 MG tablet Take 1,000 mg by mouth every 8 (eight) hours as needed for mild pain or headache.    . albuterol (PROVENTIL HFA;VENTOLIN HFA) 108 (90 BASE) MCG/ACT inhaler Inhale 2 puffs into the lungs every 6 (six) hours as needed for wheezing  or shortness of breath. 1 Inhaler 1  . amoxicillin-clavulanate (AUGMENTIN) 875-125 MG tablet Take 1 tablet by mouth 2 (two) times daily. 14 tablet 0  . aspirin EC 81 MG tablet Take 81  mg by mouth daily.    . Calcium Citrate-Vitamin D ([employer]S CALCIUM CITRATE +D PO) Take 2 tablets by mouth every evening.     . Cholecalciferol (D 2000) 2000 units TABS Take 2,000 Units by mouth daily.    . cloNIDine (CATAPRES) 0.1 MG tablet TAKE 1 TABLET BY MOUTH TWICE DAILY 180 tablet 0  . cyanocobalamin (,VITAMIN B-12,) 1000 MCG/ML injection Inject 1,000 mcg into the muscle every Thursday.    . cycloSPORINE (RESTASIS) 0.05 % ophthalmic emulsion Place 1 drop into both eyes 2 (two) times daily.     Marland Kitchen dexamethasone (DECADRON) 4 MG tablet Take 3 tabs at night and 3 tabs at 6 am the morning of chemotherapy, every 3 weeks with food 36 tablet 0  . Famotidine (PEPCID AC PO) Take by mouth every morning.    . furosemide (LASIX) 40 MG tablet TAKE 1 TABLET BY MOUTH ONCE DAILY 90 tablet 0  . JANUVIA 50 MG tablet TAKE 1 TABLET BY MOUTH ONCE DAILY 90 tablet 0  . levothyroxine (SYNTHROID, LEVOTHROID) 100 MCG tablet TAKE 1 TABLET BY MOUTH ONCE DAILY 90 tablet 2  . lidocaine-prilocaine (EMLA) cream Apply to affected area once 30 g 3  . losartan (COZAAR) 100 MG tablet Take 1 tablet (100 mg total) by mouth daily. 90 tablet 0  . Magnesium 250 MG TABS Take 250 mg by mouth every evening.     . meclizine (ANTIVERT) 32 MG tablet Take 1 tablet (32 mg total) by mouth 3 (three) times daily as needed for dizziness. 30 tablet 0  . meclizine (ANTIVERT) 50 MG tablet Take 1 tablet (50 mg total) by mouth 2 (two) times daily as needed. 30 tablet 0  . metFORMIN (GLUCOPHAGE) 500 MG tablet TAKE 2 TABLETS BY MOUTH IN THE MORNING AND 1 IN THE EVENING 270 tablet 0  . metoprolol succinate (TOPROL-XL) 50 MG 24 hr tablet TAKE ONE TABLET BY MOUTH TWICE DAILY WITH  OR  IMMEDIATELY  FOLLOWING  A  MEAL 180 tablet 4  . nitrofurantoin, macrocrystal-monohydrate, (MACROBID) 100 MG capsule Take 1 capsule (100 mg total) by mouth 2 (two) times daily. Take for 5 days. 10 capsule 0  . Omega-3 Fatty Acids (FISH OIL) 1000 MG CAPS Take 1,000 mg by mouth  every evening.    . ondansetron (ZOFRAN) 8 MG tablet Take 1 tablet (8 mg total) by mouth 2 (two) times daily as needed for refractory nausea / vomiting. Start on day 3 after chemo. 30 tablet 1  . ONETOUCH DELICA LANCETS 81O MISC USE 1 TO CHECK GLUCOSE ONCE DAILY 100 each 1  . ONETOUCH VERIO test strip USE TO CHECK GLUCOSE DAILY 100 each 3  . prochlorperazine (COMPAZINE) 10 MG tablet Take 1 tablet (10 mg total) by mouth every 6 (six) hours as needed (Nausea or vomiting). 30 tablet 1  . simvastatin (ZOCOR) 40 MG tablet TAKE 1 TABLET BY MOUTH ONCE DAILY AT 6PM 90 tablet 0  . vitamin C (ASCORBIC ACID) 500 MG tablet Take 500 mg by mouth daily.     Current Facility-Administered Medications  Medication Dose Route Frequency Provider Last Rate Last Dose  . cyanocobalamin ((VITAMIN B-12)) injection 1,000 mcg  1,000 mcg Intramuscular Daily Evelina Dun A, FNP   1,000 mcg at 11/28/17 1751    PHYSICAL EXAMINATION:  ECOG PERFORMANCE STATUS: 1 - Symptomatic but completely ambulatory  Vitals:   12/16/17 1010  BP: 119/70  Pulse: 76  Resp: 18  Temp: 98.3 F (36.8 C)  SpO2: 99%   Filed Weights   12/16/17 1010  Weight: 153 lb 12.8 oz (69.8 kg)    GENERAL:alert, no distress and comfortable SKIN: skin color, texture, turgor are normal, no rashes or significant lesions EYES: normal, Conjunctiva are pink and non-injected, sclera clear OROPHARYNX:no exudate, no erythema and lips, buccal mucosa, and tongue normal  NECK: supple, thyroid normal size, non-tender, without nodularity LYMPH:  no palpable lymphadenopathy in the cervical, axillary or inguinal LUNGS: clear to auscultation and percussion with normal breathing effort HEART: regular rate & rhythm and no murmurs and no lower extremity edema ABDOMEN:abdomen soft, non-tender and normal bowel sounds Musculoskeletal:no cyanosis of digits and no clubbing  NEURO: alert & oriented x 3 with fluent speech, no focal motor/sensory deficits  LABORATORY  DATA:  I have reviewed the data as listed    Component Value Date/Time   NA 143 12/16/2017 0938   NA 142 08/26/2017 0908   K 4.2 12/16/2017 0938   CL 104 12/16/2017 0938   CO2 25 12/16/2017 0938   GLUCOSE 140 12/16/2017 0938   BUN 21 12/16/2017 0938   BUN 12 08/26/2017 0908   CREATININE 0.94 12/16/2017 0938   CALCIUM 9.2 12/16/2017 0938   PROT 7.0 12/16/2017 0938   PROT 6.6 08/26/2017 0908   ALBUMIN 3.9 12/16/2017 0938   ALBUMIN 4.2 08/26/2017 0908   AST 21 12/16/2017 0938   ALT 15 12/16/2017 0938   ALKPHOS 60 12/16/2017 0938   BILITOT 0.5 12/16/2017 0938   GFRNONAA 58 (L) 12/16/2017 0938   GFRAA >60 12/16/2017 0938    No results found for: SPEP, UPEP  Lab Results  Component Value Date   WBC 7.8 12/16/2017   NEUTROABS 6.7 (H) 12/16/2017   HGB 9.4 (L) 12/16/2017   HCT 27.8 (L) 12/16/2017   MCV 102.2 (H) 12/16/2017   PLT 115 (L) 12/16/2017      Chemistry      Component Value Date/Time   NA 143 12/16/2017 0938   NA 142 08/26/2017 0908   K 4.2 12/16/2017 0938   CL 104 12/16/2017 0938   CO2 25 12/16/2017 0938   BUN 21 12/16/2017 0938   BUN 12 08/26/2017 0908   CREATININE 0.94 12/16/2017 0938      Component Value Date/Time   CALCIUM 9.2 12/16/2017 0938   ALKPHOS 60 12/16/2017 0938   AST 21 12/16/2017 0938   ALT 15 12/16/2017 0938   BILITOT 0.5 12/16/2017 0938     All questions were answered. The patient knows to call the clinic with any problems, questions or concerns. No barriers to learning was detected.  I spent 15 minutes counseling the patient face to face. The total time spent in the appointment was 20 minutes and more than 50% was on counseling and review of test results  Heath Lark, MD 12/16/2017 11:06 AM

## 2017-12-16 NOTE — Patient Instructions (Signed)
[facility] Discharge Instructions for Patients Receiving Chemotherapy  Today you received the following chemotherapy agents:  Taxol, Carboplatin  To help prevent nausea and vomiting after your treatment, we encourage you to take your nausea medication as prescribed.   If you develop nausea and vomiting that is not controlled by your nausea medication, call the clinic.   BELOW ARE SYMPTOMS THAT SHOULD BE REPORTED IMMEDIATELY:  *FEVER GREATER THAN 100.5 F  *CHILLS WITH OR WITHOUT FEVER  NAUSEA AND VOMITING THAT IS NOT CONTROLLED WITH YOUR NAUSEA MEDICATION  *UNUSUAL SHORTNESS OF BREATH  *UNUSUAL BRUISING OR BLEEDING  TENDERNESS IN MOUTH AND THROAT WITH OR WITHOUT PRESENCE OF ULCERS  *URINARY PROBLEMS  *BOWEL PROBLEMS  UNUSUAL RASH Items with * indicate a potential emergency and should be followed up as soon as possible.  Feel free to call the clinic should you have any questions or concerns. The clinic phone number is [phone].  Please show the CHEMO ALERT CARD at check-in to the Emergency Department and triage nurse.   

## 2017-12-16 NOTE — Assessment & Plan Note (Signed)
She tolerated treatment well except for mild peripheral neuropathy and hyperglycemia We would proceed with treatment without dose adjustment I plan to repeat CT imaging within the month of last dose of chemotherapy before we get her port removed

## 2017-12-16 NOTE — Telephone Encounter (Signed)
Gave avs and calendar ° °

## 2017-12-18 ENCOUNTER — Inpatient Hospital Stay: Payer: Medicare Other

## 2017-12-18 DIAGNOSIS — Z5111 Encounter for antineoplastic chemotherapy: Secondary | ICD-10-CM | POA: Diagnosis not present

## 2017-12-18 DIAGNOSIS — C541 Malignant neoplasm of endometrium: Secondary | ICD-10-CM | POA: Diagnosis not present

## 2017-12-18 DIAGNOSIS — Z5189 Encounter for other specified aftercare: Secondary | ICD-10-CM | POA: Diagnosis not present

## 2017-12-18 MED ORDER — PEGFILGRASTIM-CBQV 6 MG/0.6ML ~~LOC~~ SOSY
PREFILLED_SYRINGE | SUBCUTANEOUS | Status: AC
  Filled 2017-12-18: qty 0.6

## 2017-12-18 MED ORDER — PEGFILGRASTIM-CBQV 6 MG/0.6ML ~~LOC~~ SOSY
6.0000 mg | PREFILLED_SYRINGE | Freq: Once | SUBCUTANEOUS | Status: AC
  Administered 2017-12-18: 6 mg via SUBCUTANEOUS

## 2017-12-18 NOTE — Patient Instructions (Signed)
Pegfilgrastim injection What is this medicine? PEGFILGRASTIM (PEG fil gra stim) is a long-acting granulocyte colony-stimulating factor that stimulates the growth of neutrophils, a type of white blood cell important in the body's fight against infection. It is used to reduce the incidence of fever and infection in patients with certain types of cancer who are receiving chemotherapy that affects the bone marrow, and to increase survival after being exposed to high doses of radiation. This medicine may be used for other purposes; ask your health care provider or pharmacist if you have questions. COMMON BRAND NAME(S): Neulasta What should I tell my health care provider before I take this medicine? They need to know if you have any of these conditions: -kidney disease -latex allergy -ongoing radiation therapy -sickle cell disease -skin reactions to acrylic adhesives (On-Body Injector only) -an unusual or allergic reaction to pegfilgrastim, filgrastim, other medicines, foods, dyes, or preservatives -pregnant or trying to get pregnant -breast-feeding How should I use this medicine? This medicine is for injection under the skin. If you get this medicine at home, you will be taught how to prepare and give the pre-filled syringe or how to use the On-body Injector. Refer to the patient Instructions for Use for detailed instructions. Use exactly as directed. Tell your healthcare provider immediately if you suspect that the On-body Injector may not have performed as intended or if you suspect the use of the On-body Injector resulted in a missed or partial dose. It is important that you put your used needles and syringes in a special sharps container. Do not put them in a trash can. If you do not have a sharps container, call your pharmacist or healthcare provider to get one. Talk to your pediatrician regarding the use of this medicine in children. While this drug may be prescribed for selected conditions,  precautions do apply. Overdosage: If you think you have taken too much of this medicine contact a poison control center or emergency room at once. NOTE: This medicine is only for you. Do not share this medicine with others. What if I miss a dose? It is important not to miss your dose. Call your doctor or health care professional if you miss your dose. If you miss a dose due to an On-body Injector failure or leakage, a new dose should be administered as soon as possible using a single prefilled syringe for manual use. What may interact with this medicine? Interactions have not been studied. Give your health care provider a list of all the medicines, herbs, non-prescription drugs, or dietary supplements you use. Also tell them if you smoke, drink alcohol, or use illegal drugs. Some items may interact with your medicine. This list may not describe all possible interactions. Give your health care provider a list of all the medicines, herbs, non-prescription drugs, or dietary supplements you use. Also tell them if you smoke, drink alcohol, or use illegal drugs. Some items may interact with your medicine. What should I watch for while using this medicine? You may need blood work done while you are taking this medicine. If you are going to need a MRI, CT scan, or other procedure, tell your doctor that you are using this medicine (On-Body Injector only). What side effects may I notice from receiving this medicine? Side effects that you should report to your doctor or health care professional as soon as possible: -allergic reactions like skin rash, itching or hives, swelling of the face, lips, or tongue -dizziness -fever -pain, redness, or irritation at site   where injected -pinpoint red spots on the skin -red or dark-brown urine -shortness of breath or breathing problems -stomach or side pain, or pain at the shoulder -swelling -tiredness -trouble passing urine or change in the amount of urine Side  effects that usually do not require medical attention (report to your doctor or health care professional if they continue or are bothersome): -bone pain -muscle pain This list may not describe all possible side effects. Call your doctor for medical advice about side effects. You may report side effects to FDA at 1-800-FDA-1088. Where should I keep my medicine? Keep out of the reach of children. Store pre-filled syringes in a refrigerator between 2 and 8 degrees C (36 and 46 degrees F). Do not freeze. Keep in carton to protect from light. Throw away this medicine if it is left out of the refrigerator for more than 48 hours. Throw away any unused medicine after the expiration date. NOTE: This sheet is a summary. It may not cover all possible information. If you have questions about this medicine, talk to your doctor, pharmacist, or health care provider.  2018 Elsevier/Gold Standard (2016-06-13 12:58:03)  

## 2017-12-22 ENCOUNTER — Encounter: Payer: Self-pay | Admitting: Physician Assistant

## 2017-12-22 ENCOUNTER — Ambulatory Visit (INDEPENDENT_AMBULATORY_CARE_PROVIDER_SITE_OTHER): Payer: Medicare Other | Admitting: Physician Assistant

## 2017-12-22 VITALS — BP 96/63 | HR 78 | Temp 97.1°F | Ht 61.0 in | Wt 148.6 lb

## 2017-12-22 DIAGNOSIS — L600 Ingrowing nail: Secondary | ICD-10-CM

## 2017-12-22 MED ORDER — CEPHALEXIN 250 MG PO TABS: 250.0000 mg | ORAL_TABLET | Freq: Three times a day (TID) | ORAL | 1 refills | Status: DC

## 2017-12-22 NOTE — Progress Notes (Signed)
BP 96/63   Pulse 78   Temp (!) 97.1 F (36.2 C) (Oral)   Ht 5\' 1"  (1.549 m)   Wt 148 lb 9.6 oz (67.4 kg)   BMI 28.08 kg/m    Subjective:    Patient ID: Alexandria Irwin, female    DOB: 11/14/1945, 72 y.o.   MRN: [id number]  HPI: Alexandria Irwin is a 72 y.o. female presenting on 12/22/2017 for infected toe This patient comes in for an ingrown toenail on her left great toe.  She states it has been there for a couple of months but she has been undergoing chemotherapy for her ovarian cancer.  She finished that up last week and is ready to have treatment of this.  She stated she was on an antibiotic during the last couple months and it did seem to clear up some of the redness of the ingrown toenail.   Past Medical History:  Diagnosis Date  . Cataract   . Endometrial adenocarcinoma ([place])   . Environmental and seasonal allergies   . Family history of ovarian cancer   . Family history of pancreatic cancer   . Genetic testing 09/19/2017   Multi-Cancer panel (83 genes) @ [facility] - No pathogenic mutations detected  . GERD (gastroesophageal reflux disease)   . History of DVT of lower extremity    early 2000s--- lower right leg  . Hyperlipidemia   . Hypertension   . Hypothyroidism   . Type 2 diabetes mellitus ([place])    followed by pcp  . Umbilical hernia   . Vitamin B 12 deficiency   . Vitamin D deficiency   . Wears glasses    Relevant past medical, surgical, family and social history reviewed and updated as indicated. Interim medical history since our last visit reviewed. Allergies and medications reviewed and updated. DATA REVIEWED: CHART IN EPIC  Family History reviewed for pertinent findings.  Review of Systems  Constitutional: Negative.   HENT: Negative.   Eyes: Negative.   Respiratory: Negative.   Gastrointestinal: Negative.   Genitourinary: Negative.   Skin: Positive for color change and wound.    Allergies as of 12/22/2017      Reactions   Ace Inhibitors Cough   Norvasc [amlodipine] Cough   Latex Rash   Neosporin [neomycin-bacitracin Zn-polymyx] Rash   Sulfa Antibiotics Rash      Medication List        Accurate as of 12/22/17  2:16 PM. Always use your most recent med list.          acetaminophen 500 MG tablet Commonly known as:  TYLENOL Take 1,000 mg by mouth every 8 (eight) hours as needed for mild pain or headache.   albuterol 108 (90 Base) MCG/ACT inhaler Commonly known as:  PROVENTIL HFA;VENTOLIN HFA Inhale 2 puffs into the lungs every 6 (six) hours as needed for wheezing or shortness of breath.   aspirin EC 81 MG tablet Take 81 mg by mouth daily.   Cephalexin 250 MG tablet Take 1 tablet (250 mg total) by mouth 3 (three) times daily.   cloNIDine 0.1 MG tablet Commonly known as:  CATAPRES TAKE 1 TABLET BY MOUTH TWICE DAILY   CVS CALCIUM CITRATE +D PO Take 2 tablets by mouth every evening.   cyanocobalamin 1000 MCG/ML injection Commonly known as:  (VITAMIN B-12) Inject 1,000 mcg into the muscle every Thursday.   cycloSPORINE 0.05 % ophthalmic emulsion Commonly known as:  RESTASIS Place 1 drop into both eyes 2 (two)  times daily.   D 2000 2000 units Tabs Generic drug:  Cholecalciferol Take 2,000 Units by mouth daily.   dexamethasone 4 MG tablet Commonly known as:  DECADRON Take 3 tabs at night and 3 tabs at 6 am the morning of chemotherapy, every 3 weeks with food   Fish Oil 1000 MG Caps Take 1,000 mg by mouth every evening.   furosemide 40 MG tablet Commonly known as:  LASIX TAKE 1 TABLET BY MOUTH ONCE DAILY   JANUVIA 50 MG tablet Generic drug:  sitaGLIPtin TAKE 1 TABLET BY MOUTH ONCE DAILY   levothyroxine 100 MCG tablet Commonly known as:  SYNTHROID, LEVOTHROID TAKE 1 TABLET BY MOUTH ONCE DAILY   lidocaine-prilocaine cream Commonly known as:  EMLA Apply to affected area once   losartan 100 MG tablet Commonly known as:  COZAAR Take 1 tablet (100 mg total) by mouth daily.   Magnesium 250 MG Tabs Take  250 mg by mouth every evening.   meclizine 50 MG tablet Commonly known as:  ANTIVERT Take 1 tablet (50 mg total) by mouth 2 (two) times daily as needed.   meclizine 32 MG tablet Commonly known as:  ANTIVERT Take 1 tablet (32 mg total) by mouth 3 (three) times daily as needed for dizziness.   metFORMIN 500 MG tablet Commonly known as:  GLUCOPHAGE TAKE 2 TABLETS BY MOUTH IN THE MORNING AND 1 IN THE EVENING   metoprolol succinate 50 MG 24 hr tablet Commonly known as:  TOPROL-XL TAKE ONE TABLET BY MOUTH TWICE DAILY WITH  OR  IMMEDIATELY  FOLLOWING  A  MEAL   ondansetron 8 MG tablet Commonly known as:  ZOFRAN Take 1 tablet (8 mg total) by mouth 2 (two) times daily as needed for refractory nausea / vomiting. Start on day 3 after chemo.   ONETOUCH DELICA LANCETS 76B Misc USE 1 TO CHECK GLUCOSE ONCE DAILY   ONETOUCH VERIO test strip Generic drug:  glucose blood USE TO CHECK GLUCOSE DAILY   PEPCID AC PO Take by mouth every morning.   prochlorperazine 10 MG tablet Commonly known as:  COMPAZINE Take 1 tablet (10 mg total) by mouth every 6 (six) hours as needed (Nausea or vomiting).   simvastatin 40 MG tablet Commonly known as:  ZOCOR TAKE 1 TABLET BY MOUTH ONCE DAILY AT 6PM   vitamin C 500 MG tablet Commonly known as:  ASCORBIC ACID Take 500 mg by mouth daily.          Objective:    BP 96/63   Pulse 78   Temp (!) 97.1 F (36.2 C) (Oral)   Ht 5\' 1"  (1.549 m)   Wt 148 lb 9.6 oz (67.4 kg)   BMI 28.08 kg/m   Allergies  Allergen Reactions  . Ace Inhibitors Cough  . Norvasc [Amlodipine] Cough  . Latex Rash  . Neosporin [Neomycin-Bacitracin Zn-Polymyx] Rash  . Sulfa Antibiotics Rash    Wt Readings from Last 3 Encounters:  12/22/17 148 lb 9.6 oz (67.4 kg)  12/16/17 153 lb 12.8 oz (69.8 kg)  11/26/17 158 lb 6.4 oz (71.8 kg)    Physical Exam  Constitutional: She is oriented to person, place, and time. She appears well-developed and well-nourished.  HENT:  Head:  Normocephalic and atraumatic.  Eyes: Pupils are equal, round, and reactive to light. Conjunctivae and EOM are normal.  Cardiovascular: Normal rate, regular rhythm, normal heart sounds and intact distal pulses.  Pulmonary/Chest: Effort normal and breath sounds normal.  Abdominal: Soft. Bowel sounds are  normal.  Feet:  Right Foot:  Skin Integrity: Positive for erythema and warmth.  Neurological: She is alert and oriented to person, place, and time. She has normal reflexes.  Skin: Skin is warm and dry. No rash noted.  Red medial great toe with ingrown nail  Psychiatric: She has a normal mood and affect. Her behavior is normal. Judgment and thought content normal.    Results for orders placed or performed in visit on 12/16/17  CMP ([place] only)  Result Value Ref Range   Sodium 143 136 - 145 mmol/L   Potassium 4.2 3.5 - 5.1 mmol/L   Chloride 104 98 - 109 mmol/L   CO2 25 22 - 29 mmol/L   Glucose, Bld 140 70 - 140 mg/dL   BUN 21 7 - 26 mg/dL   Creatinine 0.94 0.60 - 1.10 mg/dL   Calcium 9.2 8.4 - 10.4 mg/dL   Total Protein 7.0 6.4 - 8.3 g/dL   Albumin 3.9 3.5 - 5.0 g/dL   AST 21 5 - 34 U/L   ALT 15 0 - 55 U/L   Alkaline Phosphatase 60 40 - 150 U/L   Total Bilirubin 0.5 0.2 - 1.2 mg/dL   GFR, Est Non Af Am 58 (L) >60 mL/min   GFR, Est AFR Am >60 >60 mL/min   Anion gap 14 (H) 3 - 11  CBC with Differential (Cancer Center Only)  Result Value Ref Range   WBC Count 7.8 3.9 - 10.3 K/uL   RBC 2.72 (L) 3.70 - 5.45 MIL/uL   Hemoglobin 9.4 (L) 11.6 - 15.9 g/dL   HCT 27.8 (L) 34.8 - 46.6 %   MCV 102.2 (H) 79.5 - 101.0 fL   MCH 34.6 (H) 25.1 - 34.0 pg   MCHC 33.8 31.5 - 36.0 g/dL   RDW 16.3 (H) 11.2 - 14.5 %   Platelet Count 115 (L) 145 - 400 K/uL   Neutrophils Relative % 86 %   Neutro Abs 6.7 (H) 1.5 - 6.5 K/uL   Lymphocytes Relative 12 %   Lymphs Abs 1.0 0.9 - 3.3 K/uL   Monocytes Relative 2 %   Monocytes Absolute 0.1 0.1 - 0.9 K/uL   Eosinophils Relative 0 %   Eosinophils  Absolute 0.0 0.0 - 0.5 K/uL   Basophils Relative 0 %   Basophils Absolute 0.0 0.0 - 0.1 K/uL      Assessment & Plan:   1. Ingrown nail of great toe of left foot  - Ambulatory referral to Podiatry   Continue all other maintenance medications as listed above.  Follow up plan: Return if symptoms worsen or fail to improve.  Educational handout given for [place] PA-C [place] [address]  [place], [place] [address] [phone]   12/22/2017, 2:16 PM

## 2017-12-22 NOTE — Patient Instructions (Signed)
Ingrown Toenail An ingrown toenail occurs when the corner or sides of your toenail grow into the surrounding skin. The big toe is most commonly affected, but it can happen to any of your toes. If your ingrown toenail is not treated, you will be at risk for infection. What are the causes? This condition may be caused by:  Wearing shoes that are too small or tight.  Injury or trauma, such as stubbing your toe or having your toe stepped on.  Improper cutting or care of your toenails.  Being born with (congenital) nail or foot abnormalities, such as having a nail that is too big for your toe.  What increases the risk? Risk factors for an ingrown toenail include:  Age. Your nails tend to thicken as you get older, so ingrown nails are more common in older people.  Diabetes.  Cutting your toenails incorrectly.  Blood circulation problems.  What are the signs or symptoms? Symptoms may include:  Pain, soreness, or tenderness.  Redness.  Swelling.  Hardening of the skin surrounding the toe.  Your ingrown toenail may be infected if there is fluid, pus, or drainage. How is this diagnosed? An ingrown toenail may be diagnosed by medical history and physical exam. If your toenail is infected, your health care provider may test a sample of the drainage. How is this treated? Treatment depends on the severity of your ingrown toenail. Some ingrown toenails may be treated at home. More severe or infected ingrown toenails may require surgery to remove all or part of the nail. Infected ingrown toenails may also be treated with antibiotic medicines. Follow these instructions at home:  If you were prescribed an antibiotic medicine, finish all of it even if you start to feel better.  Soak your foot in warm soapy water for 20 minutes, 3 times per day or as directed by your health care provider.  Carefully lift the edge of the nail away from the sore skin by wedging a small piece of cotton under  the corner of the nail. This may help with the pain. Be careful not to cause more injury to the area.  Wear shoes that fit well. If your ingrown toenail is causing you pain, try wearing sandals, if possible.  Trim your toenails regularly and carefully. Do not cut them in a curved shape. Cut your toenails straight across. This prevents injury to the skin at the corners of the toenail.  Keep your feet clean and dry.  If you are having trouble walking and are given crutches by your health care provider, use them as directed.  Do not pick at your toenail or try to remove it yourself.  Take medicines only as directed by your health care provider.  Keep all follow-up visits as directed by your health care provider. This is important. Contact a health care provider if:  Your symptoms do not improve with treatment. Get help right away if:  You have red streaks that start at your foot and go up your leg.  You have a fever.  You have increased redness, swelling, or pain.  You have fluid, blood, or pus coming from your toenail. This information is not intended to replace advice given to you by your health care provider. Make sure you discuss any questions you have with your health care provider. Document Released: 06/14/2000 Document Revised: 11/17/2015 Document Reviewed: 05/11/2014 Elsevier Interactive Patient Education  2018 Elsevier Inc.  

## 2017-12-24 ENCOUNTER — Other Ambulatory Visit: Payer: Self-pay | Admitting: Family

## 2017-12-29 ENCOUNTER — Other Ambulatory Visit: Payer: Self-pay | Admitting: Family

## 2017-12-29 ENCOUNTER — Ambulatory Visit (INDEPENDENT_AMBULATORY_CARE_PROVIDER_SITE_OTHER): Payer: Medicare Other | Admitting: *Deleted

## 2017-12-29 DIAGNOSIS — E538 Deficiency of other specified B group vitamins: Secondary | ICD-10-CM | POA: Diagnosis not present

## 2017-12-29 NOTE — Progress Notes (Signed)
Pt given Cyanocobalamin inj Tolerated well 

## 2018-01-08 DIAGNOSIS — L03032 Cellulitis of left toe: Secondary | ICD-10-CM | POA: Diagnosis not present

## 2018-01-08 DIAGNOSIS — M79675 Pain in left toe(s): Secondary | ICD-10-CM | POA: Diagnosis not present

## 2018-01-13 ENCOUNTER — Inpatient Hospital Stay: Payer: Medicare Other

## 2018-01-13 ENCOUNTER — Ambulatory Visit (HOSPITAL_COMMUNITY)
Admission: RE | Admit: 2018-01-13 | Discharge: 2018-01-13 | Disposition: A | Discharge status: Home / Self Care | Payer: Medicare Other | Source: Ambulatory Visit | Attending: Hematology and Oncology | Admitting: Hematology and Oncology

## 2018-01-13 ENCOUNTER — Encounter (HOSPITAL_COMMUNITY): Payer: Self-pay

## 2018-01-13 ENCOUNTER — Inpatient Hospital Stay: Payer: Medicare Other | Attending: Hematology and Oncology

## 2018-01-13 DIAGNOSIS — Z90722 Acquired absence of ovaries, bilateral: Secondary | ICD-10-CM | POA: Diagnosis not present

## 2018-01-13 DIAGNOSIS — I700 Atherosclerosis of aorta: Secondary | ICD-10-CM | POA: Insufficient documentation

## 2018-01-13 DIAGNOSIS — C541 Malignant neoplasm of endometrium: Secondary | ICD-10-CM | POA: Insufficient documentation

## 2018-01-13 DIAGNOSIS — N3090 Cystitis, unspecified without hematuria: Secondary | ICD-10-CM | POA: Diagnosis not present

## 2018-01-13 DIAGNOSIS — Z90710 Acquired absence of both cervix and uterus: Secondary | ICD-10-CM | POA: Insufficient documentation

## 2018-01-13 DIAGNOSIS — K5730 Diverticulosis of large intestine without perforation or abscess without bleeding: Secondary | ICD-10-CM | POA: Insufficient documentation

## 2018-01-13 DIAGNOSIS — Z9221 Personal history of antineoplastic chemotherapy: Secondary | ICD-10-CM | POA: Insufficient documentation

## 2018-01-13 DIAGNOSIS — Z923 Personal history of irradiation: Secondary | ICD-10-CM | POA: Insufficient documentation

## 2018-01-13 DIAGNOSIS — I2510 Atherosclerotic heart disease of native coronary artery without angina pectoris: Secondary | ICD-10-CM | POA: Diagnosis not present

## 2018-01-13 LAB — CBC WITH DIFFERENTIAL (CANCER CENTER ONLY)
Basophils Absolute: 0 10*3/uL (ref 0.0–0.1)
Basophils Relative: 1 %
EOS PCT: 2 %
Eosinophils Absolute: 0.1 10*3/uL (ref 0.0–0.5)
HCT: 28.4 % — ABNORMAL LOW (ref 34.8–46.6)
Hemoglobin: 9.8 g/dL — ABNORMAL LOW (ref 11.6–15.9)
Lymphocytes Relative: 32 %
Lymphs Abs: 1.7 10*3/uL (ref 0.9–3.3)
MCH: 36.4 pg — AB (ref 25.1–34.0)
MCHC: 34.7 g/dL (ref 31.5–36.0)
MCV: 104.9 fL — AB (ref 79.5–101.0)
MONO ABS: 0.3 10*3/uL (ref 0.1–0.9)
MONOS PCT: 6 %
NEUTROS PCT: 59 %
Neutro Abs: 3 10*3/uL (ref 1.5–6.5)
PLATELETS: 140 10*3/uL — AB (ref 145–400)
RBC: 2.71 MIL/uL — ABNORMAL LOW (ref 3.70–5.45)
RDW: 15.8 % — AB (ref 11.2–14.5)
WBC Count: 5.1 10*3/uL (ref 3.9–10.3)

## 2018-01-13 LAB — CMP (CANCER CENTER ONLY)
ALT: 18 U/L (ref 0–44)
AST: 20 U/L (ref 15–41)
Albumin: 4.2 g/dL (ref 3.5–5.0)
Alkaline Phosphatase: 48 U/L (ref 38–126)
Anion gap: 11 (ref 5–15)
BUN: 19 mg/dL (ref 8–23)
CHLORIDE: 101 mmol/L (ref 98–111)
CO2: 29 mmol/L (ref 22–32)
CREATININE: 0.98 mg/dL (ref 0.44–1.00)
Calcium: 9.8 mg/dL (ref 8.9–10.3)
GFR, EST NON AFRICAN AMERICAN: 55 mL/min — AB (ref >60)
Glucose, Bld: 103 mg/dL — ABNORMAL HIGH (ref 70–99)
POTASSIUM: 4.1 mmol/L (ref 3.5–5.1)
Sodium: 141 mmol/L (ref 135–145)
Total Bilirubin: 0.5 mg/dL (ref 0.3–1.2)
Total Protein: 7.1 g/dL (ref 6.5–8.1)

## 2018-01-13 MED ORDER — IOPAMIDOL (ISOVUE-300) INJECTION 61%
INTRAVENOUS | Status: AC
  Filled 2018-01-13: qty 100

## 2018-01-13 MED ORDER — HEPARIN SOD (PORK) LOCK FLUSH 100 UNIT/ML IV SOLN
INTRAVENOUS | Status: AC
  Filled 2018-01-13: qty 5

## 2018-01-13 MED ORDER — HEPARIN SOD (PORK) LOCK FLUSH 100 UNIT/ML IV SOLN
500.0000 [IU] | Freq: Once | INTRAVENOUS | Status: AC
  Administered 2018-01-13: 500 [IU] via INTRAVENOUS

## 2018-01-13 MED ORDER — IOPAMIDOL (ISOVUE-300) INJECTION 61%
100.0000 mL | Freq: Once | INTRAVENOUS | Status: AC | PRN
  Administered 2018-01-13: 100 mL via INTRAVENOUS

## 2018-01-14 ENCOUNTER — Encounter: Payer: Self-pay | Admitting: Gynecologic Oncology

## 2018-01-14 ENCOUNTER — Inpatient Hospital Stay (HOSPITAL_BASED_OUTPATIENT_CLINIC_OR_DEPARTMENT_OTHER): Payer: Medicare Other | Admitting: Gynecologic Oncology

## 2018-01-14 VITALS — BP 111/66 | HR 60 | Temp 97.7°F | Resp 20 | Ht 61.0 in | Wt 150.6 lb

## 2018-01-14 DIAGNOSIS — Z923 Personal history of irradiation: Secondary | ICD-10-CM

## 2018-01-14 DIAGNOSIS — Z90722 Acquired absence of ovaries, bilateral: Secondary | ICD-10-CM

## 2018-01-14 DIAGNOSIS — C541 Malignant neoplasm of endometrium: Secondary | ICD-10-CM | POA: Diagnosis not present

## 2018-01-14 DIAGNOSIS — Z9221 Personal history of antineoplastic chemotherapy: Secondary | ICD-10-CM | POA: Diagnosis not present

## 2018-01-14 DIAGNOSIS — R1907 Generalized intra-abdominal and pelvic swelling, mass and lump: Secondary | ICD-10-CM

## 2018-01-14 DIAGNOSIS — Z90710 Acquired absence of both cervix and uterus: Secondary | ICD-10-CM | POA: Diagnosis not present

## 2018-01-14 NOTE — Patient Instructions (Signed)
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 These will be looked at again in October, 2019 to monitor them for change. If they grow in size they can cause increased swelling in the legs.  If these remain stable in the new year, and there is no evidence of recurrent cancer, Dr Denman George will talk to you about having your port removed.  Please notify Dr Denman George at phone number [phone] if you notice vaginal bleeding, new pelvic or abdominal pains, bloating, feeling full easy, or a change in bladder or bowel function.   Please return to see Dr Sondra Come in October, 2019 and Dr Denman George in January, 2020.

## 2018-01-14 NOTE — Progress Notes (Signed)
Follow-up Note: Gyn-Onc  Consult was requested by Dr. Deatra Ina for the evaluation of Alexandria Irwin 72 y.o. female  CC:  Chief Complaint  Patient presents with  . Endometrial adenocarcinoma [facility])    Assessment/Plan:  Ms. Alexandria Irwin  is a 72 y.o.  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  I recommend adjuvant carboplatin and paclitaxel chemotherapy x 6 cycles with adjuvant vaginal brachytherapy for local control. We discussed treatment course and prognosis.  I recommend referral to genetics for counseling and possible testing for Lynch syndrome.   I will see Alexandria Irwin back in 6 months. She will see Dr Sondra Come in 3 months and will have a CT abd/pelvis at that time to re-evaluate the bilateral lymphocysts.   HPI: Ms. Alexandria Irwin is a 72 year old para 4 who was seen in consultation at the request of Dr. Deatra Ina for grade 1 endometrial cancer.  The patient has a history of postmenopausal bleeding since November 2017.  At this time she saw her primary care doctor who diagnosed a urinary tract infection and treated her intermittently with antibiotics for approximately 3 months.  However given the persistence of the symptoms she then sought evaluation with her gynecologist Dr. Deatra Ina in April 2018.  At this time a transvaginal ultrasound scan was performed which showed some fibroids in the uterus but no thickening of the endometrium.  Pap smear and pelvic examination were unremarkable and therefore it was felt that the source of bleeding was not gynecologic.  She was then sent to Dr. Noah Delaine from [facility] who performed an evaluation including a CT scan of the abdomen and pelvis in September 2018.  This showed grossly normal urologic system and no evidence of extrauterine disease however the uterus was slightly 4 cm intrauterine mass concerning for neoplasm.  Dr. Alyson Ingles then performed a cystoscopy which was  also unremarkable.  She was referred back to Dr. Deatra Ina for further evaluation.  In October 2018 Dr. Deatra Ina repeated the pelvic ultrasound scan which again confirmed what appeared to be a thin endometrium with fibroids.  However given the persistence of bleeding and the CT findings he took the patient to the operating room on June 13, 2017 and performed a hysteroscopy D&C and polypectomy.  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  The patient otherwise has medical history significant for a DVT in the right lower extremity approximately 12 years ago that was spontaneous.  She was treated with aspirin 325 mg daily but no anti-thrombotic agents.  She has had no recurrences since that time.  She has a history of type 2 diabetes mellitus with fairly poorly controlled with an HbA1c earlier in 2018 of 9.2%.  At that time she is taking only metformin.  She was then added Januvia.  Follow-up HbA1c in November 2018 had reduced to 7.2%.  The patient reports fasting blood glucose levels between 07/31/1950.  Her primary care doctor Dr. Lenna Gilford manages her blood glucose.  Patient also has hypertension hypercholesterolemia.  She denies a coronary artery disease history.  She has had 4 prior vaginal deliveries.  She has no family history concerning for Lynch syndrome.  She is obese with a height of 5 foot 2 inches and a weight of 174 pounds.  Her prior abdominal surgery includes a laparoscopic cholecystectomy.  She has an umbilical hernia. On 08/05/17 she underwent a robotic assisted total hysterectomy, BSO, sentinel lymph node biopsy.  Surgery also  included an umbilical hernia repair.  The final pathology revealed a uterus with a FIGO grade 2 endometrioid adenocarcinoma involving the full-thickness of the posterior myometrium with tumor present at the inked serosal surface.  There was also microscopic involvement of the left fallopian tube.  Sentinel lymph nodes bilaterally were negative as  was the cervix.  Lymphovascular space invasion was not identified.  The patient was staged as having stage III a grade 2 endometrioid adenocarcinoma and was recommended to proceed with adjuvant therapy with carboplatin paclitaxel chemotherapy and vaginal brachytherapy.  She demonstrated loss of mL H1 nuclear expression on her pathology and therefore genetics consultation was recommended.   Interval Hx:  She went on to complete 6 cycles of carb/taxol chemotherapy (completed June, 2019) and 30Gy (in 5 fractions) of vaginal brachytherapy. She tolerated therapy well.  Post treatment imaging with CT abd/pelvis on 01/13/18 showed bilateral lymphocysts measuring up to 5cm. These had slightly increased from postop imaging. Her bladder wall was also thickened. No definitive recurrent/persistent tumor.  The patient reports a history of a right DVT "years ago" and chronic right LE edema since that time. She has mild intermittent left foot and ankle swelling.    Current Meds:  Outpatient Encounter Medications as of 01/14/2018  Medication Sig  . acetaminophen (TYLENOL) 500 MG tablet Take 1,000 mg by mouth every 8 (eight) hours as needed for mild pain or headache.  . albuterol (PROVENTIL HFA;VENTOLIN HFA) 108 (90 BASE) MCG/ACT inhaler Inhale 2 puffs into the lungs every 6 (six) hours as needed for wheezing or shortness of breath.  Marland Kitchen aspirin EC 81 MG tablet Take 81 mg by mouth daily.  . Calcium Citrate-Vitamin D ([employer] CALCIUM CITRATE +D PO) Take 2 tablets by mouth every evening.   . Cholecalciferol (D 2000) 2000 units TABS Take 2,000 Units by mouth daily.  . cloNIDine (CATAPRES) 0.1 MG tablet TAKE 1 TABLET BY MOUTH TWICE DAILY  . cyanocobalamin (,VITAMIN B-12,) 1000 MCG/ML injection Inject 1,000 mcg into the muscle every Thursday.  . cycloSPORINE (RESTASIS) 0.05 % ophthalmic emulsion Place 1 drop into both eyes 2 (two) times daily.   Marland Kitchen dexamethasone (DECADRON) 4 MG tablet Take 3 tabs at night and 3 tabs at 6  am the morning of chemotherapy, every 3 weeks with food  . Famotidine (PEPCID AC PO) Take by mouth every morning.  . furosemide (LASIX) 40 MG tablet TAKE 1 TABLET BY MOUTH ONCE DAILY  . JANUVIA 50 MG tablet TAKE 1 TABLET BY MOUTH ONCE DAILY  . levothyroxine (SYNTHROID, LEVOTHROID) 100 MCG tablet TAKE 1 TABLET BY MOUTH ONCE DAILY  . lidocaine-prilocaine (EMLA) cream Apply to affected area once  . losartan (COZAAR) 100 MG tablet TAKE 1 TABLET BY MOUTH ONCE DAILY  . Magnesium 250 MG TABS Take 250 mg by mouth every evening.   . meclizine (ANTIVERT) 32 MG tablet Take 1 tablet (32 mg total) by mouth 3 (three) times daily as needed for dizziness.  . meclizine (ANTIVERT) 50 MG tablet Take 1 tablet (50 mg total) by mouth 2 (two) times daily as needed.  . metFORMIN (GLUCOPHAGE) 500 MG tablet TAKE 2 TABLETS BY MOUTH IN THE MORNING AND 1 IN THE EVENING  . metoprolol succinate (TOPROL-XL) 50 MG 24 hr tablet TAKE ONE TABLET BY MOUTH TWICE DAILY WITH  OR  IMMEDIATELY  FOLLOWING  A  MEAL  . Omega-3 Fatty Acids (FISH OIL) 1000 MG CAPS Take 1,000 mg by mouth every evening.  . ondansetron (ZOFRAN) 8 MG tablet Take  1 tablet (8 mg total) by mouth 2 (two) times daily as needed for refractory nausea / vomiting. Start on day 3 after chemo.  Glory Rosebush DELICA LANCETS 82U MISC USE 1 TO CHECK GLUCOSE ONCE DAILY  . ONETOUCH VERIO test strip USE TO CHECK GLUCOSE DAILY  . prochlorperazine (COMPAZINE) 10 MG tablet Take 1 tablet (10 mg total) by mouth every 6 (six) hours as needed (Nausea or vomiting).  . simvastatin (ZOCOR) 40 MG tablet TAKE 1 TABLET BY MOUTH ONCE DAILY AT 6PM  . vitamin C (ASCORBIC ACID) 500 MG tablet Take 500 mg by mouth daily.  . [DISCONTINUED] Cephalexin 250 MG tablet Take 1 tablet (250 mg total) by mouth 3 (three) times daily. (Patient not taking: Reported on 01/14/2018)  . [DISCONTINUED] metoprolol (LOPRESSOR) 50 MG tablet TAKE 1 TABLET (50 MG TOTAL) BY MOUTH 2 (TWO) TIMES DAILY.   Facility-Administered  Encounter Medications as of 01/14/2018  Medication  . cyanocobalamin ((VITAMIN B-12)) injection 1,000 mcg    Allergy:  Allergies  Allergen Reactions  . Ace Inhibitors Cough  . Norvasc [Amlodipine] Cough  . Latex Rash  . Neosporin [Neomycin-Bacitracin Zn-Polymyx] Rash  . Sulfa Antibiotics Rash    Social Hx:   Social History   Socioeconomic History  . Marital status: Married    Spouse name: Not on file  . Number of children: Not on file  . Years of education: Not on file  . Highest education level: Not on file  Occupational History  . Occupation: retired  Scientific laboratory technician  . Financial resource strain: Not on file  . Food insecurity:    Worry: Not on file    Inability: Not on file  . Transportation needs:    Medical: Not on file    Non-medical: Not on file  Tobacco Use  . Smoking status: Never Smoker  . Smokeless tobacco: Never Used  Substance and Sexual Activity  . Alcohol use: No  . Drug use: No  . Sexual activity: Yes    Birth control/protection: Post-menopausal  Lifestyle  . Physical activity:    Days per week: Not on file    Minutes per session: Not on file  . Stress: Not on file  Relationships  . Social connections:    Talks on phone: Not on file    Gets together: Not on file    Attends religious service: Not on file    Active member of club or organization: Not on file    Attends meetings of clubs or organizations: Not on file    Relationship status: Not on file  . Intimate partner violence:    Fear of current or ex partner: Not on file    Emotionally abused: Not on file    Physically abused: Not on file    Forced sexual activity: Not on file  Other Topics Concern  . Not on file  Social History Narrative  . Not on file    Past Surgical Hx:  Past Surgical History:  Procedure Laterality Date  . BREAST BIOPSY Left 2009   benign  . CATARACT EXTRACTION W/ INTRAOCULAR LENS IMPLANT Left 2018  . CATARACT EXTRACTION W/PHACO Right 03/14/2014   Procedure:  CATARACT EXTRACTION PHACO AND INTRAOCULAR LENS PLACEMENT (IOC);  Surgeon: Tonny Branch, MD;  Location: [facility];  Service: Ophthalmology;  Laterality: Right;  CDE: 8.34  . FOOT GANGLION EXCISION Right 2013  . HYSTEROSCOPY W/D&C N/A 06/13/2017   Procedure: DILATATION AND CURETTAGE /HYSTEROSCOPY;  Surgeon: Alden Hipp, MD;  Location: [facility];  Service: Gynecology;  Laterality: N/A;  . IR FLUORO GUIDE PORT INSERTION RIGHT  08/27/2017  . IR US GUIDE VASC ACCESS RIGHT  08/27/2017  . LAPAROSCOPIC CHOLECYSTECTOMY  2005  . ROBOTIC ASSISTED TOTAL HYSTERECTOMY WITH BILATERAL SALPINGO OOPHERECTOMY Bilateral 08/05/2017   Procedure: ROBOTIC ASSISTED TOTAL HYSTERECTOMY WITH BILATERAL SALPINGO OOPHORECTOMY;  Surgeon: Everitt Amber, MD;  Location: [facility];  Service: Gynecology;  Laterality: Bilateral;  . SENTINEL NODE BIOPSY Bilateral 08/05/2017   Procedure: SENTINEL NODE BIOPSY;  Surgeon: Everitt Amber, MD;  Location: [facility];  Service: Gynecology;  Laterality: Bilateral;  . UMBILICAL HERNIA REPAIR N/A 08/05/2017   Procedure: HERNIA REPAIR UMBILICAL ADULT;  Surgeon: Everitt Amber, MD;  Location: [facility];  Service: Gynecology;  Laterality: N/A;  . VEIN SURGERY Right 2011   vein stripping-leg  and laser treatment    Past Medical Hx:  Past Medical History:  Diagnosis Date  . Cataract   . Endometrial adenocarcinoma ([place])   . Environmental and seasonal allergies   . Family history of ovarian cancer   . Family history of pancreatic cancer   . Genetic testing 09/19/2017   Multi-Cancer panel (83 genes) @ [facility] - No pathogenic mutations detected  . GERD (gastroesophageal reflux disease)   . History of DVT of lower extremity    early 2000s--- lower right leg  . Hyperlipidemia   . Hypertension   . Hypothyroidism   . Type 2 diabetes mellitus ([place])    followed by pcp  . Umbilical hernia   . Vitamin B 12 deficiency   . Vitamin D deficiency   . Wears glasses     Past Gynecological History:  SVD x 4 No LMP recorded.  Patient has had a hysterectomy.  Family Hx:  Family History  Problem Relation Age of Onset  . Pancreatic cancer Mother 79       deceased 55  . Ovarian cancer Mother        dx 96s  . Cancer Other 33       maternal half-sister; unk. type    Review of Systems:  Constitutional  Feels well,    ENT Normal appearing ears and nares bilaterally Skin/Breast  No rash, sores, jaundice, itching, dryness Cardiovascular  No chest pain, shortness of breath, or edema  Pulmonary  No cough or wheeze.  Gastro Intestinal  No nausea, vomitting, or diarrhoea. No bright red blood per rectum, no abdominal pain, change in bowel movement, or constipation.  Genito Urinary  No frequency, urgency, dysuria,no bleeding Musculo Skeletal  No myalgia, arthralgia, joint swelling or pain  Neurologic  No weakness, numbness, change in gait,  Psychology  No depression, anxiety, insomnia.   Vitals:  Blood pressure 111/66, pulse 60, temperature 97.7 F (36.5 C), temperature source Oral, resp. rate 20, height '5\' 1"'  (1.549 m), weight 150 lb 9.6 oz (68.3 kg), SpO2 100 %.  Physical Exam: WD in NAD Neck  Supple NROM, without any enlargements.  Lymph Node Survey No cervical supraclavicular or inguinal adenopathy Cardiovascular  Pulse normal rate, regularity and rhythm. S1 and S2 normal.  Lungs  Clear to auscultation bilateraly, without wheezes/crackles/rhonchi. Good air movement.  Skin  No rash/lesions/breakdown  Psychiatry  Alert and oriented to person, place, and time  Abdomen  Normoactive bowel sounds, abdomen soft, non-tender and obese without hernia. Well healed incisions.  Back No CVA tenderness Genito Urinary  Vulva/vagina: vaginal cuff healed, no mucosal changes. No palpable masses. Rectal  deferred Extremities  No bilateral cyanosis, clubbing or edema.  Thereasa Solo, MD  01/14/2018, 3:44 PM

## 2018-01-15 ENCOUNTER — Telehealth: Payer: Self-pay | Admitting: Genetic Counselor

## 2018-01-15 ENCOUNTER — Encounter: Payer: Self-pay | Admitting: Oncology

## 2018-01-15 DIAGNOSIS — C541 Malignant neoplasm of endometrium: Secondary | ICD-10-CM

## 2018-01-15 NOTE — Telephone Encounter (Signed)
Per 7/18 sch msg.  Called patient to schedule for 8/12 @ 10:00 am. (first available appt).

## 2018-01-22 DIAGNOSIS — L03032 Cellulitis of left toe: Secondary | ICD-10-CM | POA: Diagnosis not present

## 2018-01-26 ENCOUNTER — Ambulatory Visit (INDEPENDENT_AMBULATORY_CARE_PROVIDER_SITE_OTHER): Payer: Medicare Other | Admitting: *Deleted

## 2018-01-26 DIAGNOSIS — E538 Deficiency of other specified B group vitamins: Secondary | ICD-10-CM

## 2018-01-26 NOTE — Progress Notes (Signed)
Pt given Cyanocobalamin inj Tolerated well 

## 2018-01-28 ENCOUNTER — Telehealth: Payer: Self-pay | Admitting: Hematology and Oncology

## 2018-01-28 NOTE — Telephone Encounter (Signed)
Patient called to cancel °

## 2018-02-09 ENCOUNTER — Encounter: Payer: Medicare Other | Admitting: Genetic Counselor

## 2018-02-17 ENCOUNTER — Inpatient Hospital Stay: Payer: Medicare Other | Attending: Hematology and Oncology

## 2018-02-17 DIAGNOSIS — Z452 Encounter for adjustment and management of vascular access device: Secondary | ICD-10-CM | POA: Diagnosis not present

## 2018-02-17 DIAGNOSIS — C541 Malignant neoplasm of endometrium: Secondary | ICD-10-CM | POA: Insufficient documentation

## 2018-02-17 MED ORDER — HEPARIN SOD (PORK) LOCK FLUSH 100 UNIT/ML IV SOLN
500.0000 [IU] | Freq: Once | INTRAVENOUS | Status: AC
  Administered 2018-02-17: 500 [IU]
  Filled 2018-02-17: qty 5

## 2018-02-17 MED ORDER — SODIUM CHLORIDE 0.9% FLUSH
10.0000 mL | Freq: Once | INTRAVENOUS | Status: AC
  Administered 2018-02-17: 10 mL
  Filled 2018-02-17: qty 10

## 2018-02-17 NOTE — Patient Instructions (Signed)
Implanted Port Home Guide An implanted port is a type of central line that is placed under the skin. Central lines are used to provide IV access when treatment or nutrition needs to be given through a person's veins. Implanted ports are used for long-term IV access. An implanted port may be placed because:  You need IV medicine that would be irritating to the small veins in your hands or arms.  You need long-term IV medicines, such as antibiotics.  You need IV nutrition for a long period.  You need frequent blood draws for lab tests.  You need dialysis.  Implanted ports are usually placed in the chest area, but they can also be placed in the upper arm, the abdomen, or the leg. An implanted port has two main parts:  Reservoir. The reservoir is round and will appear as a small, raised area under your skin. The reservoir is the part where a needle is inserted to give medicines or draw blood.  Catheter. The catheter is a thin, flexible tube that extends from the reservoir. The catheter is placed into a large vein. Medicine that is inserted into the reservoir goes into the catheter and then into the vein.  How will I care for my incision site? Do not get the incision site wet. Bathe or shower as directed by your health care provider. How is my port accessed? Special steps must be taken to access the port:  Before the port is accessed, a numbing cream can be placed on the skin. This helps numb the skin over the port site.  Your health care provider uses a sterile technique to access the port. ? Your health care provider must put on a mask and sterile gloves. ? The skin over your port is cleaned carefully with an antiseptic and allowed to dry. ? The port is gently pinched between sterile gloves, and a needle is inserted into the port.  Only "non-coring" port needles should be used to access the port. Once the port is accessed, a blood return should be checked. This helps ensure that the port  is in the vein and is not clogged.  If your port needs to remain accessed for a constant infusion, a clear (transparent) bandage will be placed over the needle site. The bandage and needle will need to be changed every week, or as directed by your health care provider.  Keep the bandage covering the needle clean and dry. Do not get it wet. Follow your health care provider's instructions on how to take a shower or bath while the port is accessed.  If your port does not need to stay accessed, no bandage is needed over the port.  What is flushing? Flushing helps keep the port from getting clogged. Follow your health care provider's instructions on how and when to flush the port. Ports are usually flushed with saline solution or a medicine called heparin. The need for flushing will depend on how the port is used.  If the port is used for intermittent medicines or blood draws, the port will need to be flushed: ? After medicines have been given. ? After blood has been drawn. ? As part of routine maintenance.  If a constant infusion is running, the port may not need to be flushed.  How long will my port stay implanted? The port can stay in for as long as your health care provider thinks it is needed. When it is time for the port to come out, surgery will be   done to remove it. The procedure is similar to the one performed when the port was put in. When should I seek immediate medical care? When you have an implanted port, you should seek immediate medical care if:  You notice a bad smell coming from the incision site.  You have swelling, redness, or drainage at the incision site.  You have more swelling or pain at the port site or the surrounding area.  You have a fever that is not controlled with medicine.  This information is not intended to replace advice given to you by your health care provider. Make sure you discuss any questions you have with your health care provider. Document  Released: 06/17/2005 Document Revised: 11/23/2015 Document Reviewed: 02/22/2013 Elsevier Interactive Patient Education  2017 Elsevier Inc.  

## 2018-02-20 ENCOUNTER — Ambulatory Visit (INDEPENDENT_AMBULATORY_CARE_PROVIDER_SITE_OTHER): Payer: Medicare Other | Admitting: *Deleted

## 2018-02-20 ENCOUNTER — Other Ambulatory Visit: Payer: Self-pay | Admitting: Family

## 2018-02-20 DIAGNOSIS — E538 Deficiency of other specified B group vitamins: Secondary | ICD-10-CM

## 2018-02-20 NOTE — Progress Notes (Signed)
Pt given Cyanocobalamin inj Tolerated well 

## 2018-03-07 ENCOUNTER — Ambulatory Visit (HOSPITAL_COMMUNITY)
Admission: EM | Admit: 2018-03-07 | Discharge: 2018-03-07 | Disposition: A | Discharge status: Home / Self Care | Payer: Medicare Other | Attending: Internal Medicine | Admitting: Internal Medicine

## 2018-03-07 ENCOUNTER — Other Ambulatory Visit: Payer: Self-pay

## 2018-03-07 ENCOUNTER — Encounter (HOSPITAL_COMMUNITY): Payer: Self-pay | Admitting: Emergency Medicine

## 2018-03-07 DIAGNOSIS — K219 Gastro-esophageal reflux disease without esophagitis: Secondary | ICD-10-CM | POA: Diagnosis not present

## 2018-03-07 DIAGNOSIS — Z923 Personal history of irradiation: Secondary | ICD-10-CM | POA: Insufficient documentation

## 2018-03-07 DIAGNOSIS — R350 Frequency of micturition: Secondary | ICD-10-CM | POA: Insufficient documentation

## 2018-03-07 DIAGNOSIS — Z7982 Long term (current) use of aspirin: Secondary | ICD-10-CM | POA: Diagnosis not present

## 2018-03-07 DIAGNOSIS — E559 Vitamin D deficiency, unspecified: Secondary | ICD-10-CM | POA: Diagnosis not present

## 2018-03-07 DIAGNOSIS — Z800 Family history of malignant neoplasm of digestive organs: Secondary | ICD-10-CM | POA: Insufficient documentation

## 2018-03-07 DIAGNOSIS — R509 Fever, unspecified: Secondary | ICD-10-CM | POA: Diagnosis not present

## 2018-03-07 DIAGNOSIS — Z7984 Long term (current) use of oral hypoglycemic drugs: Secondary | ICD-10-CM | POA: Diagnosis not present

## 2018-03-07 DIAGNOSIS — G620 Drug-induced polyneuropathy: Secondary | ICD-10-CM | POA: Diagnosis not present

## 2018-03-07 DIAGNOSIS — D61818 Other pancytopenia: Secondary | ICD-10-CM | POA: Insufficient documentation

## 2018-03-07 DIAGNOSIS — E785 Hyperlipidemia, unspecified: Secondary | ICD-10-CM | POA: Diagnosis not present

## 2018-03-07 DIAGNOSIS — Z90710 Acquired absence of both cervix and uterus: Secondary | ICD-10-CM | POA: Diagnosis not present

## 2018-03-07 DIAGNOSIS — R197 Diarrhea, unspecified: Secondary | ICD-10-CM | POA: Diagnosis not present

## 2018-03-07 DIAGNOSIS — E1142 Type 2 diabetes mellitus with diabetic polyneuropathy: Secondary | ICD-10-CM | POA: Diagnosis not present

## 2018-03-07 DIAGNOSIS — C541 Malignant neoplasm of endometrium: Secondary | ICD-10-CM | POA: Diagnosis not present

## 2018-03-07 DIAGNOSIS — Z86718 Personal history of other venous thrombosis and embolism: Secondary | ICD-10-CM | POA: Diagnosis not present

## 2018-03-07 DIAGNOSIS — E039 Hypothyroidism, unspecified: Secondary | ICD-10-CM | POA: Insufficient documentation

## 2018-03-07 DIAGNOSIS — Z9221 Personal history of antineoplastic chemotherapy: Secondary | ICD-10-CM | POA: Insufficient documentation

## 2018-03-07 DIAGNOSIS — E669 Obesity, unspecified: Secondary | ICD-10-CM | POA: Insufficient documentation

## 2018-03-07 DIAGNOSIS — J029 Acute pharyngitis, unspecified: Secondary | ICD-10-CM | POA: Diagnosis not present

## 2018-03-07 DIAGNOSIS — Z79899 Other long term (current) drug therapy: Secondary | ICD-10-CM | POA: Insufficient documentation

## 2018-03-07 DIAGNOSIS — I10 Essential (primary) hypertension: Secondary | ICD-10-CM | POA: Diagnosis not present

## 2018-03-07 DIAGNOSIS — Z6830 Body mass index (BMI) 30.0-30.9, adult: Secondary | ICD-10-CM | POA: Diagnosis not present

## 2018-03-07 LAB — POCT URINALYSIS DIP (DEVICE)
Bilirubin Urine: NEGATIVE
Glucose, UA: NEGATIVE mg/dL
Hgb urine dipstick: NEGATIVE
Ketones, ur: NEGATIVE mg/dL
Nitrite: NEGATIVE
PH: 5.5 (ref 5.0–8.0)
PROTEIN: 30 mg/dL — AB
SPECIFIC GRAVITY, URINE: 1.015 (ref 1.005–1.030)
UROBILINOGEN UA: 0.2 mg/dL (ref 0.0–1.0)

## 2018-03-07 LAB — POCT I-STAT, CHEM 8
BUN: 17 mg/dL (ref 8–23)
CALCIUM ION: 1.22 mmol/L (ref 1.15–1.40)
CREATININE: 1 mg/dL (ref 0.44–1.00)
Chloride: 98 mmol/L (ref 98–111)
GLUCOSE: 103 mg/dL — AB (ref 70–99)
HCT: 36 % (ref 36.0–46.0)
Hemoglobin: 12.2 g/dL (ref 12.0–15.0)
Potassium: 4.4 mmol/L (ref 3.5–5.1)
Sodium: 137 mmol/L (ref 135–145)
TCO2: 27 mmol/L (ref 22–32)

## 2018-03-07 LAB — CBC
HEMATOCRIT: 37.3 % (ref 36.0–46.0)
HEMOGLOBIN: 12.5 g/dL (ref 12.0–15.0)
MCH: 34.3 pg — ABNORMAL HIGH (ref 26.0–34.0)
MCHC: 33.5 g/dL (ref 30.0–36.0)
MCV: 102.5 fL — ABNORMAL HIGH (ref 78.0–100.0)
PLATELETS: 125 10*3/uL — AB (ref 150–400)
RBC: 3.64 MIL/uL — ABNORMAL LOW (ref 3.87–5.11)
RDW: 11.9 % (ref 11.5–15.5)
WBC: 5.3 10*3/uL (ref 4.0–10.5)

## 2018-03-07 MED ORDER — CEFTRIAXONE SODIUM 1 G IJ SOLR
1.0000 g | Freq: Once | INTRAMUSCULAR | Status: AC
  Administered 2018-03-07: 1 g via INTRAMUSCULAR

## 2018-03-07 MED ORDER — CEPHALEXIN 500 MG PO CAPS: 500.0000 mg | ORAL_CAPSULE | Freq: Four times a day (QID) | ORAL | 0 refills | Status: AC

## 2018-03-07 MED ORDER — CEFTRIAXONE SODIUM 1 G IJ SOLR
INTRAMUSCULAR | Status: AC
  Filled 2018-03-07: qty 10

## 2018-03-07 MED ORDER — LIDOCAINE HCL (PF) 1 % IJ SOLN
INTRAMUSCULAR | Status: AC
  Filled 2018-03-07: qty 4

## 2018-03-07 NOTE — Discharge Instructions (Addendum)
White blood cells stable  We gave you a shot of Rocephin today, please begin Keflex taking every 6 hours over the next 5 days  Please continue to use Tylenol for fever, continue to monitor, if fever persisting, developing new symptoms, please go to emergency room for further evaluation and work-up of this.

## 2018-03-07 NOTE — ED Triage Notes (Signed)
Fever for 2 days.  Patient has had several episodes of diarrhea.  Patient feeling tired.  Patient has had frequent urination earlier this week.  History of the same.  Patient reports pain in throat last night and feels fullness in throat currently.    Discussed patient with providers-decision is up to patient and family members . Patient and family has opted to be seen at [facility]

## 2018-03-07 NOTE — ED Provider Notes (Signed)
[place]    CSN: [id number] Arrival date & time: 03/07/18  1212     History   Chief Complaint Chief Complaint  Patient presents with  . Fever    HPI KAYLIA WINBORNE is a 72 y.o. female history of endometrial adenocarcinoma, hypertension, hyperlipidemia, DM type II, hypothyroidism presenting today for evaluation of fever.  Patient and her family have noted a fever spiking up to 102 last night.  Patient recently finished chemotherapy and radiation secondary to endometrial cancer in June.  She is not currently on any immunosuppressive therapy.  Fever has been present over the past 1 to 2 days.  She has noted a slight sore throat as well as increase in frequency and burning.  She is also noted over this past week to have some diarrhea.  Today she notes that the diarrhea is slightly decreased in frequency and has had slightly more formed to it.  She has not taken any Tylenol or ibuprofen today.  She has felt fatigued, but has not felt like she is going to pass out.  She notes that she has had some mild rhinorrhea earlier in the week, and an occasional cough, but denies shortness of breath.  HPI  Past Medical History:  Diagnosis Date  . Cataract   . Endometrial adenocarcinoma ([place])   . Environmental and seasonal allergies   . Family history of ovarian cancer   . Family history of pancreatic cancer   . Genetic testing 09/19/2017   Multi-Cancer panel (83 genes) @ [facility] - No pathogenic mutations detected  . GERD (gastroesophageal reflux disease)   . History of DVT of lower extremity    early 2000s--- lower right leg  . Hyperlipidemia   . Hypertension   . Hypothyroidism   . Type 2 diabetes mellitus ([place])    followed by pcp  . Umbilical hernia   . Vitamin B 12 deficiency   . Vitamin D deficiency   . Wears glasses     Patient Active Problem List   Diagnosis Date Noted  . Pancytopenia, acquired ([place]) 12/16/2017  . Anemia due to antineoplastic chemotherapy 11/26/2017  .  Peripheral neuropathy due to chemotherapy ([place]) 10/14/2017  . Genetic testing 09/19/2017  . Family history of ovarian cancer   . Family history of pancreatic cancer   . Obesity (BMI 30-39.9) 08/26/2017  . Easy bruising 08/18/2017  . Endometrial cancer, FIGO stage IIIA ([place]) 08/12/2017  . Endometrial cancer ([place]) 08/05/2017  . B12 deficiency 06/26/2017  . Endometrial adenocarcinoma ([place]) 06/25/2017  . Umbilical hernia without obstruction and without gangrene 06/25/2017  . Vitamin D deficiency   . Thrombophlebitis leg 09/18/2013  . Dysuria 09/18/2013  . Hypertension associated with diabetes ([place]) 12/03/2012  . Diabetes ([place]) 12/03/2012  . Hypothyroidism 12/03/2012  . Hyperlipidemia associated with type 2 diabetes mellitus ([place]) 12/03/2012    Past Surgical History:  Procedure Laterality Date  . BREAST BIOPSY Left 2009   benign  . CATARACT EXTRACTION W/ INTRAOCULAR LENS IMPLANT Left 2018  . CATARACT EXTRACTION W/PHACO Right 03/14/2014   Procedure: CATARACT EXTRACTION PHACO AND INTRAOCULAR LENS PLACEMENT (IOC);  Surgeon: Tonny Branch, MD;  Location: [facility];  Service: Ophthalmology;  Laterality: Right;  CDE: 8.34  . FOOT GANGLION EXCISION Right 2013  . HYSTEROSCOPY W/D&C N/A 06/13/2017   Procedure: DILATATION AND CURETTAGE /HYSTEROSCOPY;  Surgeon: Alden Hipp, MD;  Location: [facility];  Service: Gynecology;  Laterality: N/A;  . IR FLUORO GUIDE PORT INSERTION RIGHT  08/27/2017  . IR US  GUIDE VASC ACCESS RIGHT  08/27/2017  . LAPAROSCOPIC CHOLECYSTECTOMY  2005  . ROBOTIC ASSISTED TOTAL HYSTERECTOMY WITH BILATERAL SALPINGO OOPHERECTOMY Bilateral 08/05/2017   Procedure: ROBOTIC ASSISTED TOTAL HYSTERECTOMY WITH BILATERAL SALPINGO OOPHORECTOMY;  Surgeon: Everitt Amber, MD;  Location: [facility];  Service: Gynecology;  Laterality: Bilateral;  . SENTINEL NODE BIOPSY Bilateral 08/05/2017   Procedure: SENTINEL NODE BIOPSY;  Surgeon: Everitt Amber, MD;  Location: [facility];  Service: Gynecology;  Laterality:  Bilateral;  . UMBILICAL HERNIA REPAIR N/A 08/05/2017   Procedure: HERNIA REPAIR UMBILICAL ADULT;  Surgeon: Everitt Amber, MD;  Location: [facility];  Service: Gynecology;  Laterality: N/A;  . VEIN SURGERY Right 2011   vein stripping-leg  and laser treatment    OB History   None      Home Medications    Prior to Admission medications   Medication Sig Start Date End Date Taking? Authorizing Provider  acetaminophen (TYLENOL) 500 MG tablet Take 1,000 mg by mouth every 8 (eight) hours as needed for mild pain or headache.    [provider]  albuterol (PROVENTIL HFA;VENTOLIN HFA) 108 (90 BASE) MCG/ACT inhaler Inhale 2 puffs into the lungs every 6 (six) hours as needed for wheezing or shortness of breath. 06/19/13   Erby Pian, FNP  aspirin EC 81 MG tablet Take 81 mg by mouth daily.    [provider]  Calcium Citrate-Vitamin D ([employer] CALCIUM CITRATE +D PO) Take 2 tablets by mouth every evening.     [provider]  cephALEXin (KEFLEX) 500 MG capsule Take 1 capsule (500 mg total) by mouth 4 (four) times daily for 5 days. 03/07/18 03/12/18  Arlethia Basso C, PA-C  Cholecalciferol (D 2000) 2000 units TABS Take 2,000 Units by mouth daily.    [provider]  cloNIDine (CATAPRES) 0.1 MG tablet TAKE 1 TABLET BY MOUTH TWICE DAILY 02/23/18   Evelina Dun A, FNP  cyanocobalamin (,VITAMIN B-12,) 1000 MCG/ML injection Inject 1,000 mcg into the muscle every Thursday.    [provider]  cycloSPORINE (RESTASIS) 0.05 % ophthalmic emulsion Place 1 drop into both eyes 2 (two) times daily.     [provider]  dexamethasone (DECADRON) 4 MG tablet Take 3 tabs at night and 3 tabs at 6 am the morning of chemotherapy, every 3 weeks with food 08/29/17   Heath Lark, MD  Famotidine (PEPCID AC PO) Take by mouth every morning.    [provider]  furosemide (LASIX) 40 MG tablet TAKE 1 TABLET BY MOUTH ONCE DAILY 12/30/17   Evelina Dun A, FNP  JANUVIA 50 MG  tablet TAKE 1 TABLET BY MOUTH ONCE DAILY 12/08/17   Evelina Dun A, FNP  levothyroxine (SYNTHROID, LEVOTHROID) 100 MCG tablet TAKE 1 TABLET BY MOUTH ONCE DAILY 10/06/17   Evelina Dun A, FNP  lidocaine-prilocaine (EMLA) cream Apply to affected area once 08/29/17   Heath Lark, MD  losartan (COZAAR) 100 MG tablet TAKE 1 TABLET BY MOUTH ONCE DAILY 12/24/17   Evelina Dun A, FNP  Magnesium 250 MG TABS Take 250 mg by mouth every evening.     [provider]  meclizine (ANTIVERT) 32 MG tablet Take 1 tablet (32 mg total) by mouth 3 (three) times daily as needed for dizziness. 09/08/17   Gery Pray, MD  meclizine (ANTIVERT) 50 MG tablet Take 1 tablet (50 mg total) by mouth 2 (two) times daily as needed. 11/08/14   Wardell Honour, MD  metFORMIN (GLUCOPHAGE) 500 MG tablet TAKE 2 TABLETS BY MOUTH  IN THE MORNING AND 1 IN THE EVENING 10/20/17   Evelina Dun A, FNP  metoprolol succinate (TOPROL-XL) 50 MG 24 hr tablet TAKE ONE TABLET BY MOUTH TWICE DAILY WITH  OR  IMMEDIATELY  FOLLOWING  A  MEAL 01/06/17   Hawks, Merrillan A, FNP  Omega-3 Fatty Acids (FISH OIL) 1000 MG CAPS Take 1,000 mg by mouth every evening.    [provider]  ondansetron (ZOFRAN) 8 MG tablet Take 1 tablet (8 mg total) by mouth 2 (two) times daily as needed for refractory nausea / vomiting. Start on day 3 after chemo. 08/29/17   Heath Lark, MD  [facility] DELICA LANCETS 17P MISC USE 1 TO CHECK GLUCOSE ONCE DAILY 10/06/17   Sharion Balloon, FNP  [facility] VERIO test strip USE TO CHECK GLUCOSE DAILY 09/02/17   Evelina Dun A, FNP  prochlorperazine (COMPAZINE) 10 MG tablet Take 1 tablet (10 mg total) by mouth every 6 (six) hours as needed (Nausea or vomiting). 08/29/17   Heath Lark, MD  simvastatin (ZOCOR) 40 MG tablet TAKE 1 TABLET BY MOUTH ONCE DAILY AT  6PM 02/23/18   Evelina Dun A, FNP  vitamin C (ASCORBIC ACID) 500 MG tablet Take 500 mg by mouth daily.    [provider]  metoprolol (LOPRESSOR) 50 MG tablet TAKE 1  TABLET (50 MG TOTAL) BY MOUTH 2 (TWO) TIMES DAILY. 09/17/13 12/13/13  Erby Pian, FNP    Family History Family History  Problem Relation Age of Onset  . Pancreatic cancer Mother 45       deceased 45  . Ovarian cancer Mother        dx 22s  . Cancer Other 32       maternal half-sister; unk. type    Social History Social History   Tobacco Use  . Smoking status: Never Smoker  . Smokeless tobacco: Never Used  Substance Use Topics  . Alcohol use: No  . Drug use: No     Allergies   Ace inhibitors; Norvasc [amlodipine]; Latex; Neosporin [neomycin-bacitracin zn-polymyx]; and Sulfa antibiotics   Review of Systems Review of Systems  Constitutional: Positive for fatigue and fever. Negative for activity change and appetite change.  Respiratory: Positive for cough. Negative for shortness of breath.   Cardiovascular: Negative for chest pain.  Gastrointestinal: Positive for diarrhea. Negative for abdominal pain, nausea and vomiting.  Genitourinary: Positive for dysuria and frequency. Negative for flank pain, genital sores, hematuria, menstrual problem, vaginal bleeding, vaginal discharge and vaginal pain.  Musculoskeletal: Negative for back pain.  Skin: Negative for rash.  Neurological: Negative for dizziness, light-headedness and headaches.     Physical Exam Triage Vital Signs ED Triage Vitals  Enc Vitals Group     BP 03/07/18 1248 (!) 102/54     Pulse Rate 03/07/18 1248 73     Resp 03/07/18 1248 18     Temp 03/07/18 1248 98.7 F (37.1 C)     Temp Source 03/07/18 1248 Oral     SpO2 03/07/18 1248 100 %     Weight --      Height --      Head Circumference --      Peak Flow --      Pain Score 03/07/18 1300 2     Pain Loc --      Pain Edu? --      Excl. in [place]? --    No data found.  Updated Vital Signs BP (!) 102/54 (BP Location: Left Arm)  Pulse 73   Temp 98.7 F (37.1 C) (Oral)   Resp 18   SpO2 100%   Visual Acuity Right Eye Distance:   Left Eye  Distance:   Bilateral Distance:    Right Eye Near:   Left Eye Near:    Bilateral Near:     Physical Exam  Constitutional: She is oriented to person, place, and time. She appears well-developed and well-nourished. No distress.  HENT:  Head: Normocephalic and atraumatic.  Mouth/Throat: Oropharynx is clear and moist.  Bilateral ears without tenderness to palpation of external auricle, tragus and mastoid, EAC's without erythema or swelling, TM's with good bony landmarks and cone of light. Non erythematous.  Oral mucosa pink and moist, no tonsillar enlargement or exudate. Posterior pharynx patent and erythematous, no uvula deviation or swelling. Normal phonation.  Eyes: Pupils are equal, round, and reactive to light. Conjunctivae and EOM are normal.  Neck: Neck supple.  Cardiovascular: Normal rate and regular rhythm.  No murmur heard. Pulmonary/Chest: Effort normal and breath sounds normal. No respiratory distress.  Breathing comfortably at rest, CTABL, no wheezing, rales or other adventitious sounds auscultated  Abdominal: Soft. There is no tenderness.  Nontender light deep palpation throughout all 4 quadrants and epigastrium  Musculoskeletal: She exhibits no edema.  Neurological: She is alert and oriented to person, place, and time. No cranial nerve deficit.  Skin: Skin is warm and dry.  Psychiatric: She has a normal mood and affect.  Nursing note and vitals reviewed.    UC Treatments / Results  Labs (all labs ordered are listed, but only abnormal results are displayed) Labs Reviewed  CBC - Abnormal; Notable for the following components:      Result Value   RBC 3.64 (*)    MCV 102.5 (*)    MCH 34.3 (*)    Platelets 125 (*)    All other components within normal limits  POCT URINALYSIS DIP (DEVICE) - Abnormal; Notable for the following components:   Protein, ur 30 (*)    Leukocytes, UA SMALL (*)    All other components within normal limits  POCT I-STAT, CHEM 8 - Abnormal;  Notable for the following components:   Glucose, Bld 103 (*)    All other components within normal limits  URINE CULTURE    EKG None  Radiology No results found.  Procedures Procedures (including critical care time)  Medications Ordered in UC Medications  cefTRIAXone (ROCEPHIN) injection 1 g (has no administration in time range)    Initial Impression / Assessment and Plan / UC Course  I have reviewed the triage vital signs and the nursing notes.  Pertinent labs & imaging results that were available during my care of the patient were reviewed by me and considered in my medical decision making (see chart for details).    No fever or tachycardia today, blood pressure slightly soft, but this appears to be in relatively normal for her. I-STAT within normal limits, slightly elevated glucose, CBC with white blood cells stable, small leuks on UA.  Will send off for culture.  We will go ahead and treat for UTI, will provide a gram of Rocephin in clinic today, will send home with Keflex.  Discussed with patient and family continue to monitor temperature and symptoms, go to emergency room if symptoms worsening, having persistent fever or sick developing new symptoms, feeling weak or worsening fatigue.Discussed strict return precautions. Patient verbalized understanding and is agreeable with plan.  Final Clinical Impressions(s) / UC Diagnoses  Final diagnoses:  Fever, unspecified  Urinary frequency     Discharge Instructions     White blood cells stable  We gave you a shot of Rocephin today, please begin Keflex taking every 6 hours over the next 5 days  Please continue to use Tylenol for fever, continue to monitor, if fever persisting, developing new symptoms, please go to emergency room for further evaluation and work-up of this.    ED Prescriptions    Medication Sig Dispense Auth. Provider   cephALEXin (KEFLEX) 500 MG capsule Take 1 capsule (500 mg total) by mouth 4 (four)  times daily for 5 days. 20 capsule Korrina Zern C, PA-C     Controlled Substance Prescriptions [place] Controlled Substance Registry consulted? Not Applicable   Janith Lima, [place] 03/07/18 1406

## 2018-03-07 NOTE — ED Notes (Signed)
99-102 temperature over the past 2 days.  Has had diarrhea.  Has had frequent urination.

## 2018-03-09 ENCOUNTER — Telehealth (HOSPITAL_COMMUNITY): Payer: Self-pay

## 2018-03-09 LAB — URINE CULTURE

## 2018-03-09 NOTE — Telephone Encounter (Signed)
Urine culture positive for Klebsiella Pneumoniae. This was treated with Keflex at ucc visit. Pt called and made aware. Reports feeling better.

## 2018-03-24 ENCOUNTER — Ambulatory Visit (INDEPENDENT_AMBULATORY_CARE_PROVIDER_SITE_OTHER): Payer: Medicare Other | Admitting: *Deleted

## 2018-03-24 ENCOUNTER — Other Ambulatory Visit: Payer: Self-pay | Admitting: Family

## 2018-03-24 DIAGNOSIS — E538 Deficiency of other specified B group vitamins: Secondary | ICD-10-CM

## 2018-03-24 NOTE — Progress Notes (Signed)
Pt given Cyanocobalamin inj Tolerated well 

## 2018-03-31 DIAGNOSIS — B351 Tinea unguium: Secondary | ICD-10-CM | POA: Diagnosis not present

## 2018-03-31 DIAGNOSIS — M79676 Pain in unspecified toe(s): Secondary | ICD-10-CM | POA: Diagnosis not present

## 2018-03-31 DIAGNOSIS — L84 Corns and callosities: Secondary | ICD-10-CM | POA: Diagnosis not present

## 2018-03-31 DIAGNOSIS — E1142 Type 2 diabetes mellitus with diabetic polyneuropathy: Secondary | ICD-10-CM | POA: Diagnosis not present

## 2018-04-07 ENCOUNTER — Inpatient Hospital Stay: Payer: Medicare Other

## 2018-04-10 ENCOUNTER — Inpatient Hospital Stay: Payer: Medicare Other | Attending: Hematology and Oncology

## 2018-04-10 ENCOUNTER — Ambulatory Visit (HOSPITAL_COMMUNITY)
Admission: RE | Admit: 2018-04-10 | Discharge: 2018-04-10 | Disposition: A | Discharge status: Home / Self Care | Payer: Medicare Other | Source: Ambulatory Visit | Attending: Gynecologic Oncology | Admitting: Gynecologic Oncology

## 2018-04-10 ENCOUNTER — Inpatient Hospital Stay: Payer: Medicare Other

## 2018-04-10 DIAGNOSIS — C541 Malignant neoplasm of endometrium: Secondary | ICD-10-CM | POA: Diagnosis not present

## 2018-04-10 DIAGNOSIS — R1907 Generalized intra-abdominal and pelvic swelling, mass and lump: Secondary | ICD-10-CM | POA: Insufficient documentation

## 2018-04-10 DIAGNOSIS — N3289 Other specified disorders of bladder: Secondary | ICD-10-CM | POA: Insufficient documentation

## 2018-04-10 DIAGNOSIS — Z90710 Acquired absence of both cervix and uterus: Secondary | ICD-10-CM | POA: Insufficient documentation

## 2018-04-10 DIAGNOSIS — K5730 Diverticulosis of large intestine without perforation or abscess without bleeding: Secondary | ICD-10-CM | POA: Diagnosis not present

## 2018-04-10 DIAGNOSIS — I700 Atherosclerosis of aorta: Secondary | ICD-10-CM | POA: Diagnosis not present

## 2018-04-10 DIAGNOSIS — R932 Abnormal findings on diagnostic imaging of liver and biliary tract: Secondary | ICD-10-CM | POA: Diagnosis not present

## 2018-04-10 DIAGNOSIS — Z9049 Acquired absence of other specified parts of digestive tract: Secondary | ICD-10-CM | POA: Diagnosis not present

## 2018-04-10 DIAGNOSIS — Z90722 Acquired absence of ovaries, bilateral: Secondary | ICD-10-CM | POA: Diagnosis not present

## 2018-04-10 DIAGNOSIS — I2510 Atherosclerotic heart disease of native coronary artery without angina pectoris: Secondary | ICD-10-CM | POA: Diagnosis not present

## 2018-04-10 LAB — CBC WITH DIFFERENTIAL (CANCER CENTER ONLY)
ABS IMMATURE GRANULOCYTES: 0.01 10*3/uL (ref 0.00–0.07)
BASOS ABS: 0 10*3/uL (ref 0.0–0.1)
BASOS PCT: 0 %
Eosinophils Absolute: 0.1 10*3/uL (ref 0.0–0.5)
Eosinophils Relative: 2 %
HCT: 32 % — ABNORMAL LOW (ref 36.0–46.0)
Hemoglobin: 10.9 g/dL — ABNORMAL LOW (ref 12.0–15.0)
Immature Granulocytes: 0 %
Lymphocytes Relative: 30 %
Lymphs Abs: 2.1 10*3/uL (ref 0.7–4.0)
MCH: 33.1 pg (ref 26.0–34.0)
MCHC: 34.1 g/dL (ref 30.0–36.0)
MCV: 97.3 fL (ref 80.0–100.0)
Monocytes Absolute: 0.4 10*3/uL (ref 0.1–1.0)
Monocytes Relative: 6 %
NEUTROS ABS: 4.3 10*3/uL (ref 1.7–7.7)
Neutrophils Relative %: 62 %
PLATELETS: 129 10*3/uL — AB (ref 150–400)
RBC: 3.29 MIL/uL — AB (ref 3.87–5.11)
RDW: 12.4 % (ref 11.5–15.5)
WBC: 7 10*3/uL (ref 4.0–10.5)
nRBC: 0 % (ref 0.0–0.2)

## 2018-04-10 LAB — CMP (CANCER CENTER ONLY)
ALT: 17 U/L (ref 0–44)
AST: 20 U/L (ref 15–41)
Albumin: 3.8 g/dL (ref 3.5–5.0)
Alkaline Phosphatase: 43 U/L (ref 38–126)
Anion gap: 11 (ref 5–15)
BUN: 14 mg/dL (ref 8–23)
CHLORIDE: 103 mmol/L (ref 98–111)
CO2: 28 mmol/L (ref 22–32)
CREATININE: 0.91 mg/dL (ref 0.44–1.00)
Calcium: 9.8 mg/dL (ref 8.9–10.3)
GFR, Estimated: >60 mL/min (ref >60)
Glucose, Bld: 99 mg/dL (ref 70–99)
Potassium: 4.3 mmol/L (ref 3.5–5.1)
SODIUM: 142 mmol/L (ref 135–145)
Total Bilirubin: 0.7 mg/dL (ref 0.3–1.2)
Total Protein: 6.8 g/dL (ref 6.5–8.1)

## 2018-04-10 MED ORDER — SODIUM CHLORIDE 0.9% FLUSH
10.0000 mL | Freq: Once | INTRAVENOUS | Status: AC
  Administered 2018-04-10: 10 mL
  Filled 2018-04-10: qty 10

## 2018-04-10 MED ORDER — HEPARIN SOD (PORK) LOCK FLUSH 100 UNIT/ML IV SOLN
INTRAVENOUS | Status: AC
  Filled 2018-04-10: qty 5

## 2018-04-10 MED ORDER — HEPARIN SOD (PORK) LOCK FLUSH 100 UNIT/ML IV SOLN
500.0000 [IU] | Freq: Once | INTRAVENOUS | Status: AC
  Administered 2018-04-10: 500 [IU] via INTRAVENOUS

## 2018-04-10 MED ORDER — IOHEXOL 300 MG/ML  SOLN
100.0000 mL | Freq: Once | INTRAMUSCULAR | Status: AC | PRN
  Administered 2018-04-10: 100 mL via INTRAVENOUS

## 2018-04-13 ENCOUNTER — Other Ambulatory Visit: Payer: Self-pay | Admitting: Hematology and Oncology

## 2018-04-13 DIAGNOSIS — C541 Malignant neoplasm of endometrium: Secondary | ICD-10-CM

## 2018-04-14 ENCOUNTER — Encounter: Payer: Self-pay | Admitting: Family

## 2018-04-14 ENCOUNTER — Ambulatory Visit (INDEPENDENT_AMBULATORY_CARE_PROVIDER_SITE_OTHER): Payer: Medicare Other | Admitting: Family

## 2018-04-14 VITALS — BP 114/79 | HR 60 | Temp 97.6°F | Ht 61.0 in | Wt 149.4 lb

## 2018-04-14 DIAGNOSIS — R300 Dysuria: Secondary | ICD-10-CM | POA: Diagnosis not present

## 2018-04-14 DIAGNOSIS — N3001 Acute cystitis with hematuria: Secondary | ICD-10-CM

## 2018-04-14 LAB — URINALYSIS, COMPLETE
BILIRUBIN UA: NEGATIVE
Glucose, UA: NEGATIVE
Ketones, UA: NEGATIVE
Nitrite, UA: NEGATIVE
PH UA: 5 (ref 5.0–7.5)
Specific Gravity, UA: 1.01 (ref 1.005–1.030)
Urobilinogen, Ur: 0.2 mg/dL (ref 0.2–1.0)

## 2018-04-14 LAB — MICROSCOPIC EXAMINATION

## 2018-04-14 MED ORDER — CEPHALEXIN 500 MG PO CAPS: 500.0000 mg | ORAL_CAPSULE | Freq: Two times a day (BID) | ORAL | 0 refills | Status: DC

## 2018-04-14 NOTE — Patient Instructions (Signed)

## 2018-04-14 NOTE — Progress Notes (Signed)
   Subjective:    Patient ID: Alexandria Irwin, female    DOB: 03/10/1946, 72 y.o.   MRN: [id number]  Chief Complaint  Patient presents with  . Urinary Tract Infection    Dysuria   This is a new problem. The current episode started in the past 7 days. The problem occurs intermittently. The problem has been waxing and waning. The quality of the pain is described as burning. The pain is at a severity of 5/10. The pain is mild. Associated symptoms include frequency, hesitancy, nausea and urgency. Pertinent negatives include no flank pain or hematuria. She has tried increased fluids for the symptoms. The treatment provided no relief.      Review of Systems  Gastrointestinal: Positive for nausea.  Genitourinary: Positive for dysuria, frequency, hesitancy and urgency. Negative for flank pain and hematuria.  All other systems reviewed and are negative.      Objective:   Physical Exam  Constitutional: She is oriented to person, place, and time. She appears well-developed and well-nourished. No distress.  HENT:  Head: Normocephalic.  Eyes: Pupils are equal, round, and reactive to light.  Neck: Normal range of motion. Neck supple. No thyromegaly present.  Cardiovascular: Normal rate, regular rhythm, normal heart sounds and intact distal pulses.  No murmur heard. Pulmonary/Chest: Effort normal and breath sounds normal. No respiratory distress. She has no wheezes.  Abdominal: Soft. Bowel sounds are normal. She exhibits no distension. There is tenderness (lower abd ).  Musculoskeletal: She exhibits no edema or tenderness.  Generalized weakness  Neurological: She is alert and oriented to person, place, and time. She has normal reflexes. No cranial nerve deficit.  Skin: Skin is warm and dry.  Psychiatric: She has a normal mood and affect. Her behavior is normal. Judgment and thought content normal.  Vitals reviewed.     BP 114/79   Pulse 60   Temp 97.6 F (36.4 C) (Oral)   Ht 5\' 1"  (1.549  m)   Wt 149 lb 6.4 oz (67.8 kg)   BMI 28.23 kg/m      Assessment & Plan:  Alexandria Irwin comes in today with chief complaint of Urinary Tract Infection   Diagnosis and orders addressed:  1. Dysuria - Urine Culture - Urinalysis, Complete  2. Acute cystitis with hematuria Force fluids RTO prn Culture pending - cephALEXin (KEFLEX) 500 MG capsule; Take 1 capsule (500 mg total) by mouth 2 (two) times daily.  Dispense: 20 capsule; Refill: 0   Alexandria Dun, FNP

## 2018-04-15 ENCOUNTER — Telehealth: Payer: Self-pay

## 2018-04-15 NOTE — Telephone Encounter (Signed)
Outgoing call to patient per [name] regarding recent results of CTabd/pelvis "no findings to suggest recurent or metastatic disease ; bladder symptoms?; suspicious for cirrhosis, send results to PCP" Pt voiced understanding, reports she does have UTI and got started on antibiotics yesterday, made aware of suspicious for cirrhosis and her PCP is Evelina Dun NP.  I told pt I will fax report to their office and she can call tomorrow evening or Friday to see if they need her to have an office appt to discuss further.  Pt voiced understanding and no other needs per pt.  Notified Evelina Dun NP nurse Narda Rutherford and their fax is [phone] and she is aware to expect a fax of the CT report and said she will make sure Alyse Low NP reviews tomorrow as she is out of office today.

## 2018-04-16 ENCOUNTER — Ambulatory Visit: Payer: Self-pay | Admitting: Radiation Oncology

## 2018-04-17 ENCOUNTER — Other Ambulatory Visit: Payer: Self-pay | Admitting: Family

## 2018-04-17 DIAGNOSIS — K7460 Unspecified cirrhosis of liver: Secondary | ICD-10-CM

## 2018-04-17 NOTE — Progress Notes (Signed)
After reviewing CT scan. She has cirrhosis present. Will do referral to GI for them to evaluate. Low fat diet. Her liver enzymes are normal.

## 2018-04-17 NOTE — Progress Notes (Signed)
Pt aware of result and referral

## 2018-04-21 ENCOUNTER — Other Ambulatory Visit: Payer: Self-pay | Admitting: *Deleted

## 2018-04-21 ENCOUNTER — Other Ambulatory Visit: Payer: Self-pay | Admitting: Family

## 2018-04-21 DIAGNOSIS — A029 Salmonella infection, unspecified: Secondary | ICD-10-CM

## 2018-04-21 DIAGNOSIS — N390 Urinary tract infection, site not specified: Secondary | ICD-10-CM

## 2018-04-21 LAB — URINE CULTURE

## 2018-04-21 MED ORDER — CIPROFLOXACIN HCL 500 MG PO TABS: 500.0000 mg | ORAL_TABLET | Freq: Two times a day (BID) | ORAL | 0 refills | Status: DC

## 2018-04-22 ENCOUNTER — Telehealth: Payer: Self-pay

## 2018-04-22 NOTE — Telephone Encounter (Signed)
Patient states that she was given Cipro yesterday and took and today she has a welp on her leg and her arm. She is not having any SOB. Advised patient to stop Cipro and we would advise on a change of medication

## 2018-04-23 ENCOUNTER — Other Ambulatory Visit: Payer: Self-pay

## 2018-04-23 ENCOUNTER — Encounter: Payer: Self-pay | Admitting: Radiation Oncology

## 2018-04-23 ENCOUNTER — Ambulatory Visit
Admission: RE | Admit: 2018-04-23 | Discharge: 2018-04-23 | Disposition: A | Discharge status: Home / Self Care | Payer: Medicare Other | Source: Ambulatory Visit | Attending: Radiation Oncology | Admitting: Radiation Oncology

## 2018-04-23 VITALS — BP 139/77 | HR 56 | Temp 98.0°F | Resp 20 | Ht 61.0 in | Wt 149.0 lb

## 2018-04-23 DIAGNOSIS — Z888 Allergy status to other drugs, medicaments and biological substances status: Secondary | ICD-10-CM | POA: Insufficient documentation

## 2018-04-23 DIAGNOSIS — K7460 Unspecified cirrhosis of liver: Secondary | ICD-10-CM | POA: Insufficient documentation

## 2018-04-23 DIAGNOSIS — C541 Malignant neoplasm of endometrium: Secondary | ICD-10-CM | POA: Diagnosis not present

## 2018-04-23 DIAGNOSIS — Z923 Personal history of irradiation: Secondary | ICD-10-CM | POA: Insufficient documentation

## 2018-04-23 DIAGNOSIS — Z882 Allergy status to sulfonamides status: Secondary | ICD-10-CM | POA: Insufficient documentation

## 2018-04-23 DIAGNOSIS — Z8542 Personal history of malignant neoplasm of other parts of uterus: Secondary | ICD-10-CM | POA: Diagnosis not present

## 2018-04-23 DIAGNOSIS — Z87440 Personal history of urinary (tract) infections: Secondary | ICD-10-CM | POA: Diagnosis not present

## 2018-04-23 DIAGNOSIS — Z08 Encounter for follow-up examination after completed treatment for malignant neoplasm: Secondary | ICD-10-CM | POA: Diagnosis not present

## 2018-04-23 DIAGNOSIS — Z79899 Other long term (current) drug therapy: Secondary | ICD-10-CM | POA: Diagnosis not present

## 2018-04-23 DIAGNOSIS — Z7982 Long term (current) use of aspirin: Secondary | ICD-10-CM | POA: Insufficient documentation

## 2018-04-23 DIAGNOSIS — Z7984 Long term (current) use of oral hypoglycemic drugs: Secondary | ICD-10-CM | POA: Insufficient documentation

## 2018-04-23 MED ORDER — SULFAMETHOXAZOLE-TRIMETHOPRIM 800-160 MG PO TABS: 1.0000 | ORAL_TABLET | Freq: Two times a day (BID) | ORAL | 0 refills | Status: DC

## 2018-04-23 NOTE — Progress Notes (Signed)
Radiation Oncology         [phone] ________________________________  Name: Alexandria Irwin MRN: [id number]  Date: 04/23/2018  DOB: 22-Aug-1945  Follow-Up Visit Note  CC: Sharion Balloon, FNP  Everitt Amber, MD    ICD-10-CM   1. Endometrial adenocarcinoma (HCC) C54.1     Diagnosis:   72 y.o. 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  Last CT Abdomen/Pelvis performed 04/10/2018 showed: Interval decrease in size of fluid density structures along both pelvic sidewalls. This favors postoperative seromas or lymphangioma. No findings to suggest recurrent or metastatic disease. She last saw Dr. Denman George 3 months ago, and since then she presented to the ED with a fever and was found to have a UTI that was treated with Keflex. She developed a second UTI and was given Cipro initially, but she had a reaction and was switched to Bactrim. She also reports cirrhosis and has been referred to GI.  On review of systems, the patient denies any pain. She reports mild fatigue. She denies nausea, vomiting, or diarrhea. She reports dysuria, urgency, and frequency. She denies hematuria. She states that she has been given Bactrim for her UTI's. She denies any rectal or vaginal bleeding. She denies any vaginal discharge. She denies any skin irritation. She states that she has not been using the vaginal dilator lately due to having UTI's.                       ALLERGIES:  is allergic to ace inhibitors; ciprocin-fluocin-procin [fluocinolone]; norvasc [amlodipine]; latex; neosporin [neomycin-bacitracin zn-polymyx]; and sulfa antibiotics.  Meds: Current Outpatient Medications  Medication Sig Dispense Refill  . acetaminophen (TYLENOL) 500 MG  tablet Take 1,000 mg by mouth every 8 (eight) hours as needed for mild pain or headache.    . albuterol (PROVENTIL HFA;VENTOLIN HFA) 108 (90 BASE) MCG/ACT inhaler Inhale 2 puffs into the lungs every 6 (six) hours as needed for wheezing or shortness of breath. 1 Inhaler 1  . aspirin EC 81 MG tablet Take 81 mg by mouth daily.    . Calcium Citrate-Vitamin D ([employer] CALCIUM CITRATE +D PO) Take 2 tablets by mouth every evening.     . cephALEXin (KEFLEX) 500 MG capsule Take 1 capsule (500 mg total) by mouth 2 (two) times daily. 20 capsule 0  . Cholecalciferol (D 2000) 2000 units TABS Take 2,000 Units by mouth daily.    . cloNIDine (CATAPRES) 0.1 MG tablet TAKE 1 TABLET BY MOUTH TWICE DAILY 180 tablet 0  . cyanocobalamin (,VITAMIN B-12,) 1000 MCG/ML injection Inject 1,000 mcg into the muscle every Thursday.    . cycloSPORINE (RESTASIS) 0.05 % ophthalmic emulsion Place 1 drop into both eyes 2 (two) times daily.     Marland Kitchen dexamethasone (DECADRON) 4 MG tablet Take 3 tabs at night and 3 tabs at 6 am the morning of chemotherapy, every 3 weeks with food 36 tablet 0  . Famotidine (PEPCID AC PO) Take by mouth every morning.    . furosemide (LASIX) 40 MG tablet TAKE 1 TABLET BY MOUTH ONCE DAILY 90 tablet 0  . JANUVIA 50 MG tablet TAKE 1 TABLET BY MOUTH ONCE DAILY 90 tablet 0  . levothyroxine (SYNTHROID, LEVOTHROID) 100 MCG tablet TAKE 1 TABLET  BY MOUTH ONCE DAILY 90 tablet 2  . lidocaine-prilocaine (EMLA) cream Apply to affected area once 30 g 3  . losartan (COZAAR) 100 MG tablet TAKE 1 TABLET BY MOUTH ONCE DAILY 90 tablet 1  . Magnesium 250 MG TABS Take 250 mg by mouth every evening.     . meclizine (ANTIVERT) 25 MG tablet Take 25 mg by mouth 2 (two) times daily as needed for dizziness.    . metFORMIN (GLUCOPHAGE) 500 MG tablet TAKE 2 TABLETS BY MOUTH IN THE MORNING AND 1 IN THE EVENING 270 tablet 0  . metoprolol succinate (TOPROL-XL) 50 MG 24 hr tablet TAKE 1 TABLET BY MOUTH TWICE DAILY WITH OR IMMEDIATELY FOLLOWING A  MEAL 180 tablet 0  . Omega-3 Fatty Acids (FISH OIL) 1000 MG CAPS Take 1,000 mg by mouth every evening.    Glory Rosebush DELICA LANCETS 72C MISC USE 1 TO CHECK GLUCOSE ONCE DAILY 100 each 1  . ONETOUCH VERIO test strip USE TO CHECK GLUCOSE DAILY 100 each 3  . prochlorperazine (COMPAZINE) 10 MG tablet Take 1 tablet (10 mg total) by mouth every 6 (six) hours as needed (Nausea or vomiting). 30 tablet 1  . simvastatin (ZOCOR) 40 MG tablet TAKE 1 TABLET BY MOUTH ONCE DAILY AT  6PM 90 tablet 0  . sulfamethoxazole-trimethoprim (BACTRIM DS) 800-160 MG tablet Take 1 tablet by mouth 2 (two) times daily. 14 tablet 0  . vitamin C (ASCORBIC ACID) 500 MG tablet Take 500 mg by mouth daily.     Current Facility-Administered Medications  Medication Dose Route Frequency Provider Last Rate Last Dose  . cyanocobalamin ((VITAMIN B-12)) injection 1,000 mcg  1,000 mcg Intramuscular Daily Evelina Dun A, FNP   1,000 mcg at 03/24/18 9470    Physical Findings: The patient is in no acute distress. Patient is alert and oriented.  height is 5' 1" (1.549 m) and weight is 149 lb (67.6 kg). Her oral temperature is 98 F (36.7 C). Her blood pressure is 139/77 and her pulse is 56 (abnormal). Her respiration is 20 and oxygen saturation is 98%.   Lungs are clear to auscultation bilaterally. Heart has regular rate and rhythm. No palpable cervical, supraclavicular, or axillary adenopathy. Abdomen soft, non-tender, normal bowel sounds.  On pelvic examination the external genitalia were unremarkable. A speculum exam was performed. There are no mucosal lesions noted in the vaginal vault. Vaginal cuff intact. On bimanual and rectovaginal examination there were no pelvic masses appreciated.  Lab Findings: Lab Results  Component Value Date   WBC 7.0 04/10/2018   HGB 10.9 (L) 04/10/2018   HCT 32.0 (L) 04/10/2018   MCV 97.3 04/10/2018   PLT 129 (L) 04/10/2018    Radiographic Findings: Ct Abdomen Pelvis W Contrast  Result Date:  04/11/2018 CLINICAL DATA:  Endometrial cancer diagnosed in 2018. Chemotherapy and radiation therapy completed. Generalized intra-abdominal and pelvic swelling. EXAM: CT ABDOMEN AND PELVIS WITH CONTRAST TECHNIQUE: Multidetector CT imaging of the abdomen and pelvis was performed using the standard protocol following bolus administration of intravenous contrast. CONTRAST:  168m OMNIPAQUE IOHEXOL 300 MG/ML  SOLN COMPARISON:  01/13/2018 FINDINGS: Lower chest: Clear lung bases. Normal heart size without pericardial or pleural effusion. Hepatobiliary: Again identified is subtly irregular hepatic capsule, without focal liver lesion. Cholecystectomy. The common duct is mildly dilated for age 35.3 cm, felt to be similar back to February. No choledocholithiasis. Pancreas: Normal, without mass or ductal dilatation. Spleen: Normal in size, without focal abnormality. Adrenals/Urinary Tract: Normal adrenal glands. Normal kidneys, without  hydronephrosis. Again identified is moderate bladder wall thickening and mild mucosal hyperenhancement. This is accentuated by underdistention. There is trace air suspected within the nondependent bladder. Stomach/Bowel: Normal stomach, without wall thickening. Extensive colonic diverticulosis. Normal terminal ileum and appendix. Normal small bowel. Vascular/Lymphatic: Aortic and branch vessel atherosclerosis. No abdominopelvic adenopathy. Reproductive: Hysterectomy. Bilateral oophorectomy. Left pelvic sidewall simple appearing fluid density collection measures 3.5 x 2.9 cm on image 62/2. Decreased from 5.8 x 3.8 cm on the prior. The right pelvic sidewall fluid density is also decreased. On the order of 1.9 x 1.2 cm today versus 3.9 x 2.3 cm on the prior. Other: No significant free fluid. Mild to moderate pelvic floor laxity. No evidence of omental or peritoneal disease. Musculoskeletal: No acute osseous abnormality. IMPRESSION: 1. Status post hysterectomy and bilateral oophorectomy. Interval  decrease in size of fluid density structures along both pelvic sidewalls. This favors postoperative seromas or lymphangioma. No findings to suggest recurrent or metastatic disease. 2. Bladder wall thickening again suggest cystitis. Gas within for which correlation with instrumentation is suggested. 3. Coronary artery atherosclerosis. Aortic Atherosclerosis (ICD10-I70.0). 4. Subtle irregular hepatic capsule, again suspicious for cirrhosis. 5. Cholecystectomy with similar mild common duct dilatation. Normal bilirubin on 07/30/2017 suggests this is within normal variation. Electronically Signed   By: Abigail Miyamoto M.D.   On: 04/11/2018 12:14    Impression:   No evidence of recurrence on clinical exam. Recent abdominal/pelvic CT scan shows decrease in the size of fluid density structures along pelvic side walls. No evidence of recurrence.   Plan:  Patient will follow up with Dr. Denman George in 3 months. Return for follow-up in radiation oncology in 6 months.  ____________________________________  Blair Promise, PhD, MD  This document serves as a record of services personally performed by Gery Pray, MD. It was created on his behalf by Rae Lips, a trained medical scribe. The creation of this record is based on the scribe's personal observations and the provider's statements to them. This document has been checked and approved by the attending provider.

## 2018-04-23 NOTE — Telephone Encounter (Signed)
Bactrim Prescription sent to pharmacy  °

## 2018-04-23 NOTE — Progress Notes (Signed)
Pt here today for a follow-up appoint for endometrial cancer. Pt denies having pain. Pt states having mild fatigue. Pt denies having any nausea or vomiting. Pt denies having diarrhea. Pt states that she has had 2 UTI's and has dysuria, urgency and frequency. Pt denies having hematuria. Pt states that she has be given Bactrim. Pt denies having any rectal or vaginal bleeding. Pt denies having any vaginal discharge. Pt denies having any skin irritation. Pt states that she has not been using the vaginal dilator due to having UTI's.  BP 139/77 (BP Location: Right Arm, Patient Position: Sitting)   Pulse (!) 56   Temp 98 F (36.7 C) (Oral)   Resp 20   Ht 5\' 1"  (1.549 m)   Wt 149 lb (67.6 kg)   SpO2 98%   BMI 28.15 kg/m    Wt Readings from Last 3 Encounters:  04/23/18 149 lb (67.6 kg)  04/14/18 149 lb 6.4 oz (67.8 kg)  01/14/18 150 lb 9.6 oz (68.3 kg)

## 2018-04-23 NOTE — Telephone Encounter (Signed)
What type of rash does she have with bactrim? Since she could not tolerate cipro? If she can not tolerate Bactrim she will need to come in for Rocephin injections

## 2018-04-23 NOTE — Telephone Encounter (Signed)
She will try the bactrim again, she doesn't remember the rash. Send med to pharmacy

## 2018-04-23 NOTE — Addendum Note (Signed)
Addended by: Evelina Dun A on: 04/23/2018 10:19 AM   Modules accepted: Orders

## 2018-04-24 ENCOUNTER — Other Ambulatory Visit: Payer: Self-pay | Admitting: Student

## 2018-04-24 ENCOUNTER — Ambulatory Visit (INDEPENDENT_AMBULATORY_CARE_PROVIDER_SITE_OTHER): Payer: Medicare Other | Admitting: *Deleted

## 2018-04-24 DIAGNOSIS — E538 Deficiency of other specified B group vitamins: Secondary | ICD-10-CM

## 2018-04-24 NOTE — Progress Notes (Signed)
Pt comes in for B12 injection and is confused about her antibiotic. She was started on Keflex and then found out that UA CX shows salmonella. abx was changed to Cipro and now the cipro has started causing a few red patches on her legs and under arm. I brought in Monia Pouch, NP to take a look with me and she suggest that pt take some benadryl 25 mg nightly and cont. The cipro.  The only other med she could take to kill bacteria is sulfa and it causes rash ALL over.  She is aware if she has further problems to call back and speak with the on-call physician.

## 2018-04-27 ENCOUNTER — Ambulatory Visit (HOSPITAL_COMMUNITY)
Admission: RE | Admit: 2018-04-27 | Discharge: 2018-04-27 | Disposition: A | Discharge status: Home / Self Care | Payer: Medicare Other | Source: Ambulatory Visit | Attending: Hematology and Oncology | Admitting: Hematology and Oncology

## 2018-04-27 ENCOUNTER — Encounter (HOSPITAL_COMMUNITY): Payer: Self-pay

## 2018-04-27 ENCOUNTER — Other Ambulatory Visit: Payer: Self-pay

## 2018-04-27 DIAGNOSIS — Z79890 Hormone replacement therapy (postmenopausal): Secondary | ICD-10-CM | POA: Diagnosis not present

## 2018-04-27 DIAGNOSIS — Z7982 Long term (current) use of aspirin: Secondary | ICD-10-CM | POA: Diagnosis not present

## 2018-04-27 DIAGNOSIS — E538 Deficiency of other specified B group vitamins: Secondary | ICD-10-CM | POA: Insufficient documentation

## 2018-04-27 DIAGNOSIS — Z8542 Personal history of malignant neoplasm of other parts of uterus: Secondary | ICD-10-CM | POA: Diagnosis not present

## 2018-04-27 DIAGNOSIS — K219 Gastro-esophageal reflux disease without esophagitis: Secondary | ICD-10-CM | POA: Insufficient documentation

## 2018-04-27 DIAGNOSIS — I10 Essential (primary) hypertension: Secondary | ICD-10-CM | POA: Insufficient documentation

## 2018-04-27 DIAGNOSIS — Z7984 Long term (current) use of oral hypoglycemic drugs: Secondary | ICD-10-CM | POA: Diagnosis not present

## 2018-04-27 DIAGNOSIS — E559 Vitamin D deficiency, unspecified: Secondary | ICD-10-CM | POA: Diagnosis not present

## 2018-04-27 DIAGNOSIS — Z79899 Other long term (current) drug therapy: Secondary | ICD-10-CM | POA: Insufficient documentation

## 2018-04-27 DIAGNOSIS — Z9889 Other specified postprocedural states: Secondary | ICD-10-CM | POA: Diagnosis not present

## 2018-04-27 DIAGNOSIS — E039 Hypothyroidism, unspecified: Secondary | ICD-10-CM | POA: Diagnosis not present

## 2018-04-27 DIAGNOSIS — H269 Unspecified cataract: Secondary | ICD-10-CM | POA: Diagnosis not present

## 2018-04-27 DIAGNOSIS — E785 Hyperlipidemia, unspecified: Secondary | ICD-10-CM | POA: Diagnosis not present

## 2018-04-27 DIAGNOSIS — C541 Malignant neoplasm of endometrium: Secondary | ICD-10-CM

## 2018-04-27 DIAGNOSIS — Z9841 Cataract extraction status, right eye: Secondary | ICD-10-CM | POA: Diagnosis not present

## 2018-04-27 DIAGNOSIS — Z452 Encounter for adjustment and management of vascular access device: Secondary | ICD-10-CM | POA: Insufficient documentation

## 2018-04-27 DIAGNOSIS — Z90710 Acquired absence of both cervix and uterus: Secondary | ICD-10-CM | POA: Insufficient documentation

## 2018-04-27 DIAGNOSIS — Z91040 Latex allergy status: Secondary | ICD-10-CM | POA: Insufficient documentation

## 2018-04-27 DIAGNOSIS — Z4682 Encounter for fitting and adjustment of non-vascular catheter: Secondary | ICD-10-CM | POA: Diagnosis not present

## 2018-04-27 DIAGNOSIS — Z9842 Cataract extraction status, left eye: Secondary | ICD-10-CM | POA: Diagnosis not present

## 2018-04-27 DIAGNOSIS — Z90722 Acquired absence of ovaries, bilateral: Secondary | ICD-10-CM | POA: Diagnosis not present

## 2018-04-27 DIAGNOSIS — Z86718 Personal history of other venous thrombosis and embolism: Secondary | ICD-10-CM | POA: Diagnosis not present

## 2018-04-27 DIAGNOSIS — Z882 Allergy status to sulfonamides status: Secondary | ICD-10-CM | POA: Insufficient documentation

## 2018-04-27 DIAGNOSIS — E119 Type 2 diabetes mellitus without complications: Secondary | ICD-10-CM | POA: Diagnosis not present

## 2018-04-27 DIAGNOSIS — Z888 Allergy status to other drugs, medicaments and biological substances status: Secondary | ICD-10-CM | POA: Insufficient documentation

## 2018-04-27 DIAGNOSIS — Z5111 Encounter for antineoplastic chemotherapy: Secondary | ICD-10-CM | POA: Diagnosis not present

## 2018-04-27 HISTORY — PX: IR REMOVAL TUN ACCESS W/ PORT W/O FL MOD SED: IMG2290

## 2018-04-27 LAB — CBC
HEMATOCRIT: 37.3 % (ref 36.0–46.0)
HEMOGLOBIN: 12.1 g/dL (ref 12.0–15.0)
MCH: 32.8 pg (ref 26.0–34.0)
MCHC: 32.4 g/dL (ref 30.0–36.0)
MCV: 101.1 fL — AB (ref 80.0–100.0)
NRBC: 0 % (ref 0.0–0.2)
Platelets: 158 10*3/uL (ref 150–400)
RBC: 3.69 MIL/uL — ABNORMAL LOW (ref 3.87–5.11)
RDW: 13.1 % (ref 11.5–15.5)
WBC: 6.6 10*3/uL (ref 4.0–10.5)

## 2018-04-27 LAB — PROTIME-INR
INR: 0.97
Prothrombin Time: 12.8 seconds (ref 11.4–15.2)

## 2018-04-27 LAB — GLUCOSE, CAPILLARY: GLUCOSE-CAPILLARY: 100 mg/dL — AB (ref 70–99)

## 2018-04-27 LAB — APTT: aPTT: 27 seconds (ref 24–36)

## 2018-04-27 MED ORDER — CEFAZOLIN SODIUM-DEXTROSE 2-4 GM/100ML-% IV SOLN
INTRAVENOUS | Status: AC
  Filled 2018-04-27: qty 100

## 2018-04-27 MED ORDER — FLUMAZENIL 0.5 MG/5ML IV SOLN
INTRAVENOUS | Status: AC
  Filled 2018-04-27: qty 5

## 2018-04-27 MED ORDER — NALOXONE HCL 0.4 MG/ML IJ SOLN
INTRAMUSCULAR | Status: AC
  Filled 2018-04-27: qty 1

## 2018-04-27 MED ORDER — CEFAZOLIN SODIUM-DEXTROSE 2-4 GM/100ML-% IV SOLN
2.0000 g | INTRAVENOUS | Status: AC
  Administered 2018-04-27: 2 g via INTRAVENOUS

## 2018-04-27 MED ORDER — FENTANYL CITRATE (PF) 100 MCG/2ML IJ SOLN
INTRAMUSCULAR | Status: AC | PRN
  Administered 2018-04-27: 50 ug via INTRAVENOUS

## 2018-04-27 MED ORDER — FENTANYL CITRATE (PF) 100 MCG/2ML IJ SOLN
INTRAMUSCULAR | Status: AC
  Filled 2018-04-27: qty 2

## 2018-04-27 MED ORDER — SODIUM CHLORIDE 0.9 % IV SOLN
INTRAVENOUS | Status: DC
  Administered 2018-04-27: 14:00:00 via INTRAVENOUS

## 2018-04-27 MED ORDER — LIDOCAINE-EPINEPHRINE (PF) 2 %-1:200000 IJ SOLN
INTRAMUSCULAR | Status: AC
  Filled 2018-04-27: qty 20

## 2018-04-27 MED ORDER — MIDAZOLAM HCL 2 MG/2ML IJ SOLN
INTRAMUSCULAR | Status: AC | PRN
  Administered 2018-04-27: 1 mg via INTRAVENOUS

## 2018-04-27 MED ORDER — MIDAZOLAM HCL 2 MG/2ML IJ SOLN
INTRAMUSCULAR | Status: AC
  Filled 2018-04-27: qty 2

## 2018-04-27 NOTE — Discharge Instructions (Signed)
Moderate Conscious Sedation, Adult, Care After °These instructions provide you with information about caring for yourself after your procedure. Your health care provider may also give you more specific instructions. Your treatment has been planned according to current medical practices, but problems sometimes occur. Call your health care provider if you have any problems or questions after your procedure. °What can I expect after the procedure? °After your procedure, it is common: °· To feel sleepy for several hours. °· To feel clumsy and have poor balance for several hours. °· To have poor judgment for several hours. °· To vomit if you eat too soon. ° °Follow these instructions at home: °For at least 24 hours after the procedure: ° °· Do not: °? Participate in activities where you could fall or become injured. °? Drive. °? Use heavy machinery. °? Drink alcohol. °? Take sleeping pills or medicines that cause drowsiness. °? Make important decisions or sign legal documents. °? Take care of children on your own. °· Rest. °Eating and drinking °· Follow the diet recommended by your health care provider. °· If you vomit: °? Drink water, juice, or soup when you can drink without vomiting. °? Make sure you have little or no nausea before eating solid foods. °General instructions °· Have a responsible adult stay with you until you are awake and alert. °· Take over-the-counter and prescription medicines only as told by your health care provider. °· If you smoke, do not smoke without supervision. °· Keep all follow-up visits as told by your health care provider. This is important. °Contact a health care provider if: °· You keep feeling nauseous or you keep vomiting. °· You feel light-headed. °· You develop a rash. °· You have a fever. °Get help right away if: °· You have trouble breathing. °This information is not intended to replace advice given to you by your health care provider. Make sure you discuss any questions you have  with your health care provider. °Document Released: 04/07/2013 Document Revised: 11/20/2015 Document Reviewed: 10/07/2015 °Elsevier Interactive Patient Education © 2018 [employer] ° ° °Implanted Port Removal, Care After °Refer to this sheet in the next few weeks. These instructions provide you with information about caring for yourself after your procedure. Your health care provider may also give you more specific instructions. Your treatment has been planned according to current medical practices, but problems sometimes occur. Call your health care provider if you have any problems or questions after your procedure. °What can I expect after the procedure? °After the procedure, it is common to have: °· Soreness or pain near your incision. °· Some swelling or bruising near your incision. ° °Follow these instructions at home: °Medicines °· Take over-the-counter and prescription medicines only as told by your health care provider. °· If you were prescribed an antibiotic medicine, take it as told by your health care provider. Do not stop taking the antibiotic even if you start to feel better. °Bathing °· Do not take baths, swim, or use a hot tub until your health care provider approves. Ask your health care provider if you can take showers. You may only be allowed to take sponge baths for bathing.  You may shower tomorrow. °Incision care °· Follow instructions from your health care provider about how to take care of your incision. Make sure you: °? Wash your hands with soap and water before you change your bandage (dressing). If soap and water are not available, use hand sanitizer. °? Change your dressing as told by your   health care provider.  You may remove your dressing tomorrow. °? Keep your dressing dry. °? Leave stitches (sutures), skin glue, or adhesive strips in place. These skin closures may need to stay in place for 2 weeks or longer. If adhesive strip edges start to loosen and curl up, you may trim the  loose edges. Do not remove adhesive strips completely unless your health care provider tells you to do that. °· Check your incision area every day for signs of infection. Check for: °? More redness, swelling, or pain. °? More fluid or blood. °? Warmth. °? Pus or a bad smell. °Driving °· If you received a sedative, do not drive for 24 hours after the procedure. °· If you did not receive a sedative, ask your health care provider when it is safe to drive. °Activity °· Return to your normal activities as told by your health care provider. Ask your health care provider what activities are safe for you. °· Until your health care provider says it is safe: °? Do not lift anything that is heavier than 10 lb (4.5 kg). °? Do not do activities that involve lifting your arms over your head. °General instructions °· Do not use any tobacco products, such as cigarettes, chewing tobacco, and e-cigarettes. Tobacco can delay healing. If you need help quitting, ask your health care provider. °· Keep all follow-up visits as told by your health care provider. This is important. °Contact a health care provider if: °· You have more redness, swelling, or pain around your incision. °· You have more fluid or blood coming from your incision. °· Your incision feels warm to the touch. °· You have pus or a bad smell coming from your incision. °· You have a fever. °· You have pain that is not relieved by your pain medicine. °Get help right away if: °· You have chest pain. °· You have difficulty breathing. °This information is not intended to replace advice given to you by your health care provider. Make sure you discuss any questions you have with your health care provider. °Document Released: 05/29/2015 Document Revised: 11/23/2015 Document Reviewed: 03/22/2015 °Elsevier Interactive Patient Education © 2018 [employer] ° °

## 2018-04-27 NOTE — H&P (Signed)
Referring Physician(s): Heath Lark  Supervising Physician: Jacqulynn Cadet  Patient Status:  Alexandria Irwin OP  Chief Complaint:  "I'm getting my port out"  Subjective: Patient familiar to IR service from prior Port-A-Cath placement on 08/27/2017.  She has a history of metastatic endometrial carcinoma, status post surgery and chemoradiation.  She no longer needs Port-A-Cath and presents today for port removal.  She currently denies fever, headache, chest pain, dyspnea, cough, abdominal/back pain, nausea, vomiting or bleeding.  She has recently been treated for UTI.  Past Medical History:  Diagnosis Date  . Cataract   . Endometrial adenocarcinoma ([place])   . Environmental and seasonal allergies   . Family history of ovarian cancer   . Family history of pancreatic cancer   . Genetic testing 09/19/2017   Multi-Cancer panel (83 genes) @ [facility] - No pathogenic mutations detected  . GERD (gastroesophageal reflux disease)   . History of DVT of lower extremity    early 2000s--- lower right leg  . Hyperlipidemia   . Hypertension   . Hypothyroidism   . Type 2 diabetes mellitus ([place])    followed by pcp  . Umbilical hernia   . Vitamin B 12 deficiency   . Vitamin D deficiency   . Wears glasses    Past Surgical History:  Procedure Laterality Date  . BREAST BIOPSY Left 2009   benign  . CATARACT EXTRACTION W/ INTRAOCULAR LENS IMPLANT Left 2018  . CATARACT EXTRACTION W/PHACO Right 03/14/2014   Procedure: CATARACT EXTRACTION PHACO AND INTRAOCULAR LENS PLACEMENT (IOC);  Surgeon: Tonny Branch, MD;  Location: [facility];  Service: Ophthalmology;  Laterality: Right;  CDE: 8.34  . FOOT GANGLION EXCISION Right 2013  . HYSTEROSCOPY W/D&C N/A 06/13/2017   Procedure: DILATATION AND CURETTAGE /HYSTEROSCOPY;  Surgeon: Alden Hipp, MD;  Location: [facility];  Service: Gynecology;  Laterality: N/A;  . IR FLUORO GUIDE PORT INSERTION RIGHT  08/27/2017  . IR US GUIDE VASC ACCESS RIGHT  08/27/2017  . LAPAROSCOPIC  CHOLECYSTECTOMY  2005  . ROBOTIC ASSISTED TOTAL HYSTERECTOMY WITH BILATERAL SALPINGO OOPHERECTOMY Bilateral 08/05/2017   Procedure: ROBOTIC ASSISTED TOTAL HYSTERECTOMY WITH BILATERAL SALPINGO OOPHORECTOMY;  Surgeon: Everitt Amber, MD;  Location: [facility];  Service: Gynecology;  Laterality: Bilateral;  . SENTINEL NODE BIOPSY Bilateral 08/05/2017   Procedure: SENTINEL NODE BIOPSY;  Surgeon: Everitt Amber, MD;  Location: [facility];  Service: Gynecology;  Laterality: Bilateral;  . UMBILICAL HERNIA REPAIR N/A 08/05/2017   Procedure: HERNIA REPAIR UMBILICAL ADULT;  Surgeon: Everitt Amber, MD;  Location: [facility];  Service: Gynecology;  Laterality: N/A;  . VEIN SURGERY Right 2011   vein stripping-leg  and laser treatment      Allergies: Ace inhibitors; Ciprocin-fluocin-procin [fluocinolone]; Norvasc [amlodipine]; Latex; Neosporin [neomycin-bacitracin zn-polymyx]; and Sulfa antibiotics  Medications: Prior to Admission medications   Medication Sig Start Date End Date Taking? Authorizing Provider  acetaminophen (TYLENOL) 500 MG tablet Take 1,000 mg by mouth every 8 (eight) hours as needed for mild pain or headache.    [provider]  albuterol (PROVENTIL HFA;VENTOLIN HFA) 108 (90 BASE) MCG/ACT inhaler Inhale 2 puffs into the lungs every 6 (six) hours as needed for wheezing or shortness of breath. 06/19/13   Erby Pian, FNP  aspirin EC 81 MG tablet Take 81 mg by mouth daily.    [provider]  Calcium Citrate-Vitamin D ([employer] CALCIUM CITRATE +D PO) Take 2 tablets by mouth every evening.     [provider]  Cholecalciferol (D 2000) 2000 units TABS Take  2,000 Units by mouth daily.    [provider]  cloNIDine (CATAPRES) 0.1 MG tablet TAKE 1 TABLET BY MOUTH TWICE DAILY 02/23/18   Evelina Dun A, FNP  cyanocobalamin (,VITAMIN B-12,) 1000 MCG/ML injection Inject 1,000 mcg into the muscle every Thursday.    [provider]  cycloSPORINE (RESTASIS) 0.05 % ophthalmic  emulsion Place 1 drop into both eyes 2 (two) times daily.     [provider]  dexamethasone (DECADRON) 4 MG tablet Take 3 tabs at night and 3 tabs at 6 am the morning of chemotherapy, every 3 weeks with food 08/29/17   Heath Lark, MD  Famotidine (PEPCID AC PO) Take by mouth every morning.    [provider]  furosemide (LASIX) 40 MG tablet TAKE 1 TABLET BY MOUTH ONCE DAILY 12/30/17   Evelina Dun A, FNP  JANUVIA 50 MG tablet TAKE 1 TABLET BY MOUTH ONCE DAILY 12/08/17   Evelina Dun A, FNP  levothyroxine (SYNTHROID, LEVOTHROID) 100 MCG tablet TAKE 1 TABLET BY MOUTH ONCE DAILY 10/06/17   Evelina Dun A, FNP  lidocaine-prilocaine (EMLA) cream Apply to affected area once 08/29/17   Heath Lark, MD  losartan (COZAAR) 100 MG tablet TAKE 1 TABLET BY MOUTH ONCE DAILY 12/24/17   Evelina Dun A, FNP  Magnesium 250 MG TABS Take 250 mg by mouth every evening.     [provider]  meclizine (ANTIVERT) 25 MG tablet Take 25 mg by mouth 2 (two) times daily as needed for dizziness.    [provider]  metFORMIN (GLUCOPHAGE) 500 MG tablet TAKE 2 TABLETS BY MOUTH IN THE MORNING AND 1 IN THE EVENING 10/20/17   Evelina Dun A, FNP  metoprolol succinate (TOPROL-XL) 50 MG 24 hr tablet TAKE 1 TABLET BY MOUTH TWICE DAILY WITH OR IMMEDIATELY FOLLOWING A MEAL 03/26/18   Hawks, Christy A, FNP  Omega-3 Fatty Acids (FISH OIL) 1000 MG CAPS Take 1,000 mg by mouth every evening.    [provider]  [facility] DELICA LANCETS 87F MISC USE 1 TO CHECK GLUCOSE ONCE DAILY 10/06/17   Sharion Balloon, FNP  [facility] VERIO test strip USE TO CHECK GLUCOSE DAILY 09/02/17   Evelina Dun A, FNP  prochlorperazine (COMPAZINE) 10 MG tablet Take 1 tablet (10 mg total) by mouth every 6 (six) hours as needed (Nausea or vomiting). 08/29/17   Heath Lark, MD  simvastatin (ZOCOR) 40 MG tablet TAKE 1 TABLET BY MOUTH ONCE DAILY AT  6PM 02/23/18   Evelina Dun A, FNP  sulfamethoxazole-trimethoprim (BACTRIM DS)  800-160 MG tablet Take 1 tablet by mouth 2 (two) times daily. 04/23/18   Sharion Balloon, FNP  vitamin C (ASCORBIC ACID) 500 MG tablet Take 500 mg by mouth daily.    [provider]  metoprolol (LOPRESSOR) 50 MG tablet TAKE 1 TABLET (50 MG TOTAL) BY MOUTH 2 (TWO) TIMES DAILY. 09/17/13 12/13/13  Erby Pian, FNP     Vital Signs: BP 119/72   Pulse 94   Temp 97.7 F (36.5 C) (Oral)   Resp 16   SpO2 98%   Physical Exam awake, alert.  Chest clear to auscultation bilaterally;  clean, intact right chest wall Port-A-Cath.  Heart with regular rate and rhythm.  Abdomen soft, positive bowel sounds, nontender.  Bilateral lower extremity edema noted.  Imaging: No results found.  Labs:  CBC: Recent Labs    12/16/17 0938 01/13/18 1137 03/07/18 1307 03/07/18 1325 04/10/18 1144  WBC 7.8 5.1 5.3  --  7.0  HGB 9.4* 9.8* 12.5 12.2 10.9*  HCT 27.8* 28.4* 37.3 36.0 32.0*  PLT 115* 140* 125*  --  129*    COAGS: Recent Labs    08/27/17 0928  INR 0.99    BMP: Recent Labs    11/26/17 0952 12/16/17 0938 01/13/18 1137 03/07/18 1325 04/10/18 1144  NA 141 143 141 137 142  K 4.0 4.2 4.1 4.4 4.3  CL 103 104 101 98 103  CO2 23 25 29   --  28  GLUCOSE 128 140 103* 103* 99  BUN 19 21 19 17 14   CALCIUM 9.4 9.2 9.8  --  9.8  CREATININE 0.94 0.94 0.98 1.00 0.91  GFRNONAA 58* 58* 55*  --  >60  GFRAA >60 >60 >60  --  >60    LIVER FUNCTION TESTS: Recent Labs    11/26/17 0952 12/16/17 0938 01/13/18 1137 04/10/18 1144  BILITOT 0.7 0.5 0.5 0.7  AST 16 21 20 20   ALT 14 15 18 17   ALKPHOS 50 60 48 43  PROT 7.0 7.0 7.1 6.8  ALBUMIN 3.9 3.9 4.2 3.8    Assessment and Plan:  Pt with history of metastatic endometrial carcinoma, status post surgery and chemoradiation.  She no longer needs her Port-A-Cath and presents today for port removal.  Details/risks of procedure, including but not limited to, internal bleeding, infection, injury to adjacent structures discussed with  patient with her understanding and consent.  LABS PENDING  Electronically Signed: D. Rowe Robert, PA-C 04/27/2018, 1:18 PM   I spent a total of 20 minutes at the the patient's bedside AND on the patient's hospital floor or unit, greater than 50% of which was counseling/coordinating care for Port-A-Cath removal

## 2018-04-27 NOTE — Procedures (Signed)
Interventional Radiology Procedure Note  Procedure: Port removal  Complications: None  Estimated Blood Loss: None  Recommendations: - DC Home  Signed,  Criselda Peaches, MD

## 2018-04-29 ENCOUNTER — Encounter (INDEPENDENT_AMBULATORY_CARE_PROVIDER_SITE_OTHER): Payer: Self-pay | Admitting: *Deleted

## 2018-04-29 ENCOUNTER — Encounter (INDEPENDENT_AMBULATORY_CARE_PROVIDER_SITE_OTHER): Payer: Self-pay | Admitting: Internal Medicine

## 2018-04-29 ENCOUNTER — Ambulatory Visit (INDEPENDENT_AMBULATORY_CARE_PROVIDER_SITE_OTHER): Payer: Medicare Other | Admitting: Internal Medicine

## 2018-04-29 VITALS — BP 130/80 | HR 68 | Temp 97.7°F | Ht 61.0 in | Wt 147.5 lb

## 2018-04-29 DIAGNOSIS — K7469 Other cirrhosis of liver: Secondary | ICD-10-CM | POA: Diagnosis not present

## 2018-04-29 LAB — STATE LABORATORY REPORT

## 2018-04-29 NOTE — Progress Notes (Signed)
Subjective:    Patient ID: Alexandria Irwin, female    DOB: [birth year], 72 y.o.   MRN: [id number]  HPI Referred by Evelina Dun for cirrhosis. Underwent a CT 01/13/2018 (endometrial cancer diagnosed in 2018. Chemo and radiation complete).  4. Subtle irregular hepatic capsule, again suspicious for cirrhosis. CT 03/13/2017 shwed slightly sunken appearance and nodular contour, suggesting early changes of cirrhosis.  Liver enzymes normal.  Port a cath removed this past Monday. She denies every having any abdominal ascites. Noted her platelet are down.  Her appetite is okay. She is trying to eat. BMs are normal.  No hx of etoh abuse.  No family hx of NASH that she can recall.    Hepatic Function Latest Ref Rng & Units 04/10/2018 01/13/2018 12/16/2017  Total Protein 6.5 - 8.1 g/dL 6.8 7.1 7.0  Albumin 3.5 - 5.0 g/dL 3.8 4.2 3.9  AST 15 - 41 U/L '20 20 21  ' ALT 0 - 44 U/L '17 18 15  ' Alk Phosphatase 38 - 126 U/L 43 48 60  Total Bilirubin 0.3 - 1.2 mg/dL 0.7 0.5 0.5   CBC    Component Value Date/Time   WBC 6.6 04/27/2018 1331   RBC 3.69 (L) 04/27/2018 1331   HGB 12.1 04/27/2018 1331   HGB 10.9 (L) 04/10/2018 1144   HGB 12.4 05/26/2017 0912   HCT 37.3 04/27/2018 1331   HCT 36.8 05/26/2017 0912   PLT 158 04/27/2018 1331   PLT 129 (L) 04/10/2018 1144   PLT 209 05/26/2017 0912   MCV 101.1 (H) 04/27/2018 1331   MCV 92 05/26/2017 0912   MCH 32.8 04/27/2018 1331   MCHC 32.4 04/27/2018 1331   RDW 13.1 04/27/2018 1331   RDW 13.9 05/26/2017 0912   LYMPHSABS 2.1 04/10/2018 1144   LYMPHSABS 1.9 05/26/2017 0912   MONOABS 0.4 04/10/2018 1144   EOSABS 0.1 04/10/2018 1144   EOSABS 0.2 05/26/2017 0912   BASOSABS 0.0 04/10/2018 1144   BASOSABS 0.0 05/26/2017 0912      Review of Systems Past Medical History:  Diagnosis Date  . Cataract   . Endometrial adenocarcinoma ([place])   . Environmental and seasonal allergies   . Family history of ovarian cancer   . Family history of pancreatic cancer     . Genetic testing 09/19/2017   Multi-Cancer panel (83 genes) @ [facility] - No pathogenic mutations detected  . GERD (gastroesophageal reflux disease)   . History of DVT of lower extremity    early 2000s--- lower right leg  . Hyperlipidemia   . Hypertension   . Hypothyroidism   . Type 2 diabetes mellitus ([place])    followed by pcp  . Umbilical hernia   . Vitamin B 12 deficiency   . Vitamin D deficiency   . Wears glasses     Past Surgical History:  Procedure Laterality Date  . BREAST BIOPSY Left 2009   benign  . CATARACT EXTRACTION W/ INTRAOCULAR LENS IMPLANT Left 2018  . CATARACT EXTRACTION W/PHACO Right 03/14/2014   Procedure: CATARACT EXTRACTION PHACO AND INTRAOCULAR LENS PLACEMENT (IOC);  Surgeon: Tonny Branch, MD;  Location: [facility];  Service: Ophthalmology;  Laterality: Right;  CDE: 8.34  . FOOT GANGLION EXCISION Right 2013  . HYSTEROSCOPY W/D&C N/A 06/13/2017   Procedure: DILATATION AND CURETTAGE /HYSTEROSCOPY;  Surgeon: Alden Hipp, MD;  Location: [facility];  Service: Gynecology;  Laterality: N/A;  . IR FLUORO GUIDE PORT INSERTION RIGHT  08/27/2017  . IR REMOVAL TUN ACCESS W/ PORT W/O FL MOD  SED  04/27/2018  . IR US GUIDE VASC ACCESS RIGHT  08/27/2017  . LAPAROSCOPIC CHOLECYSTECTOMY  2005  . ROBOTIC ASSISTED TOTAL HYSTERECTOMY WITH BILATERAL SALPINGO OOPHERECTOMY Bilateral 08/05/2017   Procedure: ROBOTIC ASSISTED TOTAL HYSTERECTOMY WITH BILATERAL SALPINGO OOPHORECTOMY;  Surgeon: Everitt Amber, MD;  Location: [facility];  Service: Gynecology;  Laterality: Bilateral;  . SENTINEL NODE BIOPSY Bilateral 08/05/2017   Procedure: SENTINEL NODE BIOPSY;  Surgeon: Everitt Amber, MD;  Location: [facility];  Service: Gynecology;  Laterality: Bilateral;  . UMBILICAL HERNIA REPAIR N/A 08/05/2017   Procedure: HERNIA REPAIR UMBILICAL ADULT;  Surgeon: Everitt Amber, MD;  Location: [facility];  Service: Gynecology;  Laterality: N/A;  . VEIN SURGERY Right 2011   vein stripping-leg  and laser treatment    Allergies   Allergen Reactions  . Ace Inhibitors Cough  . Ciprocin-Fluocin-Procin [Fluocinolone] Hives  . Norvasc [Amlodipine] Cough  . Latex Rash  . Neosporin [Neomycin-Bacitracin Zn-Polymyx] Rash  . Sulfa Antibiotics Rash    Current Outpatient Medications on File Prior to Visit  Medication Sig Dispense Refill  . acetaminophen (TYLENOL) 500 MG tablet Take 1,000 mg by mouth every 8 (eight) hours as needed for mild pain or headache.    . albuterol (PROVENTIL HFA;VENTOLIN HFA) 108 (90 BASE) MCG/ACT inhaler Inhale 2 puffs into the lungs every 6 (six) hours as needed for wheezing or shortness of breath. 1 Inhaler 1  . aspirin EC 81 MG tablet Take 81 mg by mouth daily.    . Calcium Citrate-Vitamin D ([employer]S CALCIUM CITRATE +D PO) Take 2 tablets by mouth every evening.     . Cholecalciferol (D 2000) 2000 units TABS Take 2,000 Units by mouth daily.    . cloNIDine (CATAPRES) 0.1 MG tablet TAKE 1 TABLET BY MOUTH TWICE DAILY 180 tablet 0  . cyanocobalamin (,VITAMIN B-12,) 1000 MCG/ML injection Inject 1,000 mcg into the muscle every 30 (thirty) days.     . cycloSPORINE (RESTASIS) 0.05 % ophthalmic emulsion Place 1 drop into both eyes 2 (two) times daily.     . furosemide (LASIX) 40 MG tablet TAKE 1 TABLET BY MOUTH ONCE DAILY 90 tablet 0  . JANUVIA 50 MG tablet TAKE 1 TABLET BY MOUTH ONCE DAILY 90 tablet 0  . levothyroxine (SYNTHROID, LEVOTHROID) 100 MCG tablet TAKE 1 TABLET BY MOUTH ONCE DAILY 90 tablet 2  . losartan (COZAAR) 100 MG tablet TAKE 1 TABLET BY MOUTH ONCE DAILY 90 tablet 1  . Magnesium 250 MG TABS Take 250 mg by mouth every evening.     . meclizine (ANTIVERT) 25 MG tablet Take 25 mg by mouth 2 (two) times daily as needed for dizziness.    . metFORMIN (GLUCOPHAGE) 500 MG tablet TAKE 2 TABLETS BY MOUTH IN THE MORNING AND 1 IN THE EVENING 270 tablet 0  . metoprolol succinate (TOPROL-XL) 50 MG 24 hr tablet TAKE 1 TABLET BY MOUTH TWICE DAILY WITH OR IMMEDIATELY FOLLOWING A MEAL 180 tablet 0  . Omega-3  Fatty Acids (FISH OIL) 1000 MG CAPS Take 1,000 mg by mouth every evening.    [facility] DELICA LANCETS 76A MISC USE 1 TO CHECK GLUCOSE ONCE DAILY 100 each 1  . ONETOUCH VERIO test strip USE TO CHECK GLUCOSE DAILY 100 each 3  . simvastatin (ZOCOR) 40 MG tablet TAKE 1 TABLET BY MOUTH ONCE DAILY AT  6PM 90 tablet 0  . vitamin C (ASCORBIC ACID) 500 MG tablet Take 500 mg by mouth daily.    . [DISCONTINUED] metoprolol (LOPRESSOR) 50 MG tablet  TAKE 1 TABLET (50 MG TOTAL) BY MOUTH 2 (TWO) TIMES DAILY. 60 tablet 2   Current Facility-Administered Medications on File Prior to Visit  Medication Dose Route Frequency Provider Last Rate Last Dose  . cyanocobalamin ((VITAMIN B-12)) injection 1,000 mcg  1,000 mcg Intramuscular Daily Evelina Dun A, FNP   1,000 mcg at 03/24/18 0909        Objective:   Physical Exam Blood pressure 130/80, pulse 68, temperature 97.7 F (36.5 C), height '5\' 1"'  (1.549 m), weight 147 lb 8 oz (66.9 kg). Alert and oriented. Skin warm and dry. Oral mucosa is moist.   . Sclera anicteric, conjunctivae is pink. Thyroid not enlarged. No cervical lymphadenopathy. Lungs clear. Heart regular rate and rhythm.  Abdomen is soft. Bowel sounds are positive. No hepatomegaly. No abdominal masses felt. No tenderness.  No edema to lower extremities.            Assessment & Plan:  Cirrhosis. Am going to get a CBC, AFP, PT/INR, acute hepatitis panel. [place] RUQ   OV in 6 months.  Further recommendations to follow.

## 2018-04-29 NOTE — Patient Instructions (Addendum)
Labs. OV in 6 months.  Diet and exercise.  Low fat diet.

## 2018-04-30 ENCOUNTER — Other Ambulatory Visit: Payer: Medicare Other

## 2018-04-30 DIAGNOSIS — A029 Salmonella infection, unspecified: Secondary | ICD-10-CM

## 2018-04-30 DIAGNOSIS — N390 Urinary tract infection, site not specified: Secondary | ICD-10-CM | POA: Diagnosis not present

## 2018-04-30 LAB — CBC
HCT: 32.5 % — ABNORMAL LOW (ref 35.0–45.0)
Hemoglobin: 11.2 g/dL — ABNORMAL LOW (ref 11.7–15.5)
MCH: 32.7 pg (ref 27.0–33.0)
MCHC: 34.5 g/dL (ref 32.0–36.0)
MCV: 95 fL (ref 80.0–100.0)
MPV: 10.8 fL (ref 7.5–12.5)
PLATELETS: 135 10*3/uL — AB (ref 140–400)
RBC: 3.42 10*6/uL — ABNORMAL LOW (ref 3.80–5.10)
RDW: 12.4 % (ref 11.0–15.0)
WBC: 5.3 10*3/uL (ref 3.8–10.8)

## 2018-04-30 LAB — HEPATITIS PANEL, ACUTE
HEP A IGM: NONREACTIVE
Hep B C IgM: NONREACTIVE
Hepatitis B Surface Ag: NONREACTIVE
Hepatitis C Ab: NONREACTIVE
SIGNAL TO CUT-OFF: 0.02 (ref <1.00)

## 2018-04-30 LAB — PROTIME-INR
INR: 1
PROTHROMBIN TIME: 10.7 s (ref 9.0–11.5)

## 2018-04-30 LAB — AFP TUMOR MARKER: AFP-Tumor Marker: 2.1 ng/mL

## 2018-05-01 LAB — URINE CULTURE: ORGANISM ID, BACTERIA: NO GROWTH

## 2018-05-04 ENCOUNTER — Other Ambulatory Visit: Payer: Self-pay | Admitting: Family

## 2018-05-07 ENCOUNTER — Ambulatory Visit (HOSPITAL_COMMUNITY)
Admission: RE | Admit: 2018-05-07 | Discharge: 2018-05-07 | Disposition: A | Discharge status: Home / Self Care | Payer: Medicare Other | Source: Ambulatory Visit | Attending: Internal Medicine | Admitting: Internal Medicine

## 2018-05-07 DIAGNOSIS — K7469 Other cirrhosis of liver: Secondary | ICD-10-CM | POA: Diagnosis not present

## 2018-05-07 DIAGNOSIS — K7460 Unspecified cirrhosis of liver: Secondary | ICD-10-CM | POA: Diagnosis not present

## 2018-05-07 DIAGNOSIS — Z9049 Acquired absence of other specified parts of digestive tract: Secondary | ICD-10-CM | POA: Diagnosis not present

## 2018-05-19 LAB — HM DIABETES EYE EXAM

## 2018-05-21 ENCOUNTER — Other Ambulatory Visit: Payer: Self-pay | Admitting: Family

## 2018-05-25 ENCOUNTER — Telehealth (INDEPENDENT_AMBULATORY_CARE_PROVIDER_SITE_OTHER): Payer: Self-pay | Admitting: Internal Medicine

## 2018-05-25 DIAGNOSIS — K7469 Other cirrhosis of liver: Secondary | ICD-10-CM

## 2018-05-25 NOTE — Telephone Encounter (Signed)
err

## 2018-05-25 NOTE — Telephone Encounter (Signed)
Will rule out auto immune process

## 2018-05-26 ENCOUNTER — Ambulatory Visit (INDEPENDENT_AMBULATORY_CARE_PROVIDER_SITE_OTHER): Payer: Medicare Other | Admitting: *Deleted

## 2018-05-26 ENCOUNTER — Telehealth: Payer: Self-pay | Admitting: *Deleted

## 2018-05-26 DIAGNOSIS — E538 Deficiency of other specified B group vitamins: Secondary | ICD-10-CM | POA: Diagnosis not present

## 2018-05-26 NOTE — Progress Notes (Signed)
Pt given cyancobalamin inj Tolerated wll

## 2018-05-26 NOTE — Telephone Encounter (Signed)
Returned the patient's call and scheduled a follow up appt for January  

## 2018-06-01 ENCOUNTER — Encounter: Payer: Self-pay | Admitting: Family

## 2018-06-01 ENCOUNTER — Ambulatory Visit: Payer: Self-pay | Admitting: *Deleted

## 2018-06-01 ENCOUNTER — Telehealth (INDEPENDENT_AMBULATORY_CARE_PROVIDER_SITE_OTHER): Payer: Self-pay | Admitting: Internal Medicine

## 2018-06-01 ENCOUNTER — Ambulatory Visit (INDEPENDENT_AMBULATORY_CARE_PROVIDER_SITE_OTHER): Payer: Medicare Other | Admitting: Family

## 2018-06-01 VITALS — BP 100/68 | HR 66 | Temp 97.1°F | Ht 61.0 in | Wt 147.0 lb

## 2018-06-01 DIAGNOSIS — E119 Type 2 diabetes mellitus without complications: Secondary | ICD-10-CM | POA: Diagnosis not present

## 2018-06-01 DIAGNOSIS — Z23 Encounter for immunization: Secondary | ICD-10-CM

## 2018-06-01 DIAGNOSIS — K7469 Other cirrhosis of liver: Secondary | ICD-10-CM | POA: Diagnosis not present

## 2018-06-01 DIAGNOSIS — E1169 Type 2 diabetes mellitus with other specified complication: Secondary | ICD-10-CM

## 2018-06-01 DIAGNOSIS — I10 Essential (primary) hypertension: Secondary | ICD-10-CM | POA: Diagnosis not present

## 2018-06-01 DIAGNOSIS — E538 Deficiency of other specified B group vitamins: Secondary | ICD-10-CM

## 2018-06-01 DIAGNOSIS — E785 Hyperlipidemia, unspecified: Secondary | ICD-10-CM

## 2018-06-01 DIAGNOSIS — E559 Vitamin D deficiency, unspecified: Secondary | ICD-10-CM | POA: Diagnosis not present

## 2018-06-01 DIAGNOSIS — E1159 Type 2 diabetes mellitus with other circulatory complications: Secondary | ICD-10-CM

## 2018-06-01 DIAGNOSIS — C541 Malignant neoplasm of endometrium: Secondary | ICD-10-CM | POA: Diagnosis not present

## 2018-06-01 DIAGNOSIS — E669 Obesity, unspecified: Secondary | ICD-10-CM | POA: Diagnosis not present

## 2018-06-01 DIAGNOSIS — E039 Hypothyroidism, unspecified: Secondary | ICD-10-CM | POA: Diagnosis not present

## 2018-06-01 DIAGNOSIS — I152 Hypertension secondary to endocrine disorders: Secondary | ICD-10-CM

## 2018-06-01 DIAGNOSIS — R42 Dizziness and giddiness: Secondary | ICD-10-CM

## 2018-06-01 LAB — BAYER DCA HB A1C WAIVED: HB A1C (BAYER DCA - WAIVED): 5.7 % (ref <7.0)

## 2018-06-01 MED ORDER — GLUCOSE BLOOD VI STRP: ORAL_STRIP | 3 refills | Status: DC

## 2018-06-01 MED ORDER — MECLIZINE HCL 25 MG PO TABS: 25.0000 mg | ORAL_TABLET | Freq: Two times a day (BID) | ORAL | 1 refills | Status: DC | PRN

## 2018-06-01 MED ORDER — ONETOUCH DELICA LANCETS 33G MISC: 1 refills | Status: DC

## 2018-06-01 NOTE — Patient Instructions (Signed)
Alexandria Irwin was given information about Chronic Care Management services today including:  1. CCM service includes personalized support from designated clinical staff supervised by her physician, including individualized plan of care and coordination with other care providers 2. 24/7 contact phone numbers for assistance for urgent and routine care needs. 3. Service will only be billed when office clinical staff spend 20 minutes or more in a month to coordinate care. 4. Only one practitioner may furnish and bill the service in a calendar month. 5. The patient may stop CCM services at any time (effective at the end of the month) by phone call to the office staff. 6. The patient will be responsible for cost sharing (co-pay) of up to 20% of the service fee (after annual deductible is met).  Alexandria Irwin was informed that I will return a call to her, as per her request, to review any questions she may have about CCM services.   Chong Sicilian, RN-BC, BSN

## 2018-06-01 NOTE — Chronic Care Management (AMB) (Addendum)
  Chronic Care Management   Note  06/01/2018 Name: Alexandria Irwin MRN: [id number] DOB: 05/31/1946   Ms Alexandria Irwin was referred to the CCM team by her health plan. I reached out by phone to [name] today and explained CCM services as outlined below.    [name] was given information about Chronic Care Management services today including:  1. CCM service includes personalized support from designated clinical staff supervised by her physician, including individualized plan of care and coordination with other care providers 2. 24/7 contact phone numbers for assistance for urgent and routine care needs. 3. Service will only be billed when office clinical staff spend 20 minutes or more in a month to coordinate care. 4. Only one practitioner may furnish and bill the service in a calendar month. 5. The patient may stop CCM services at any time (effective at the end of the month) by phone call to the office staff. 6. The patient will be responsible for cost sharing (co-pay) of up to 20% of the service fee (after annual deductible is met).  [name] declined enrollment in the CCM program at this time but agreed to allow me to return a call to her sometime over the next 14 days.   Should [name] agree to CCM services in the future, the focus of our initial engagement would be: diabetes management, HTN management, medication management, and recent reports of dizziness.   Plan I will reach out to patient by phone over the next 14 days.   Chong Sicilian, RN-BC, BSN [facility] Chronic Care Management Team

## 2018-06-01 NOTE — Patient Instructions (Signed)
Hypotension Hypotension, commonly called low blood pressure, is when the force of blood pumping through your arteries is too weak. Arteries are blood vessels that carry blood from the heart throughout the body. When blood pressure is too low, you may not get enough blood to your brain or to the rest of your organs. This can cause weakness, light-headedness, rapid heartbeat, and fainting. Depending on the cause and severity, hypotension may be harmless (benign) or cause serious problems (critical). What are the causes? Possible causes of hypotension include:  Blood loss.  Loss of body fluids (dehydration).  Heart problems.  Hormone (endocrine) problems.  Pregnancy.  Severe infection.  Lack of certain nutrients.  Severe allergic reactions (anaphylaxis).  Certain medicines, such as blood pressure medicine or medicines that make the body lose excess fluids (diuretics). Sometimes, hypotension can be caused by not taking medicine as directed, such as taking too much of a certain medicine.  What increases the risk? Certain factors can make you more likely to develop hypotension, including:  Age. Risk increases as you get older.  Conditions that affect the heart or the central nervous system.  Taking certain medicines, such as blood pressure medicine or diuretics.  Being pregnant.  What are the signs or symptoms? Symptoms of this condition may include:  Weakness.  Light-headedness.  Dizziness.  Blurred vision.  Fatigue.  Rapid heartbeat.  Fainting, in severe cases.  How is this diagnosed? This condition is diagnosed based on:  Your medical history.  Your symptoms.  Your blood pressure measurement. Your health care provider will check your blood pressure when you are: ? Lying down. ? Sitting. ? Standing.  A blood pressure reading is recorded as two numbers, such as "120 over 80" (or 120/80). The first ("top") number is called the systolic pressure. It is a  measure of the pressure in your arteries as your heart beats. The second ("bottom") number is called the diastolic pressure. It is a measure of the pressure in your arteries when your heart relaxes between beats. Blood pressure is measured in a unit called mm Hg. Healthy blood pressure for adults is 120/80. If your blood pressure is below 90/60, you may be diagnosed with hypotension. Other information or tests that may be used to diagnose hypotension include:  Your other vital signs, such as your heart rate and temperature.  Blood tests.  Tilt table test. For this test, you will be safely secured to a table that moves you from a lying position to an upright position. Your heart rhythm and blood pressure will be monitored during the test.  How is this treated? Treatment for this condition may include:  Changing your diet. This may involve eating more salt (sodium) or drinking more water.  Taking medicines to raise your blood pressure.  Changing the dosage of certain medicines you are taking that might be lowering your blood pressure.  Wearing compression stockings. These stockings help to prevent blood clots and reduce swelling in your legs.  In some cases, you may need to go to the hospital for:  Fluid replacement. This means you will receive fluids through an IV tube.  Blood replacement. This means you will receive donated blood through an IV tube (transfusion).  Treating an infection or heart problems, if this applies.  Monitoring. You may need to be monitored while medicines that you are taking wear off.  Follow these instructions at home: Eating and drinking   Drink enough fluid to keep your urine clear or pale yellow.    Eat a healthy diet and follow instructions from your health care provider about eating or drinking restrictions. A healthy diet includes: ? Fresh fruits and vegetables. ? Whole grains. ? Lean meats. ? Low-fat dairy products.  Eat extra salt only as  directed. Do not add extra salt to your diet unless your health care provider told you to do that.  Eat frequent, small meals.  Avoid standing up suddenly after eating. Medicines  Take over-the-counter and prescription medicines only as told by your health care provider. ? Follow instructions from your health care provider about changing the dosage of your current medicines, if this applies. ? Do not stop or adjust any of your medicines on your own. General instructions  Wear compression stockings as told by your health care provider.  Get up slowly from lying down or sitting positions. This gives your blood pressure a chance to adjust.  Avoid hot showers and excessive heat as directed by your health care provider.  Return to your normal activities as told by your health care provider. Ask your health care provider what activities are safe for you.  Do not use any products that contain nicotine or tobacco, such as cigarettes and e-cigarettes. If you need help quitting, ask your health care provider.  Keep all follow-up visits as told by your health care provider. This is important. Contact a health care provider if:  You vomit.  You have diarrhea.  You have a fever for more than 2-3 days.  You feel more thirsty than usual.  You feel weak and tired. Get help right away if:  You have chest pain.  You have a fast or irregular heartbeat.  You develop numbness in any part of your body.  You cannot move your arms or your legs.  You have trouble speaking.  You become sweaty or feel light-headed.  You faint.  You feel short of breath.  You have trouble staying awake.  You feel confused. This information is not intended to replace advice given to you by your health care provider. Make sure you discuss any questions you have with your health care provider. Document Released: 06/17/2005 Document Revised: 01/05/2016 Document Reviewed: 12/07/2015 Elsevier Interactive  Patient Education  2018 Elsevier Inc.  

## 2018-06-01 NOTE — Telephone Encounter (Signed)
err

## 2018-06-01 NOTE — Progress Notes (Signed)
Subjective:    Patient ID: Alexandria Irwin, female    DOB: June 17, 1946, 72 y.o.   MRN: [id number]  Chief Complaint  Patient presents with  . Medical Management of Chronic Issues   Pt presents to the office today for chronic follow up. Pt has Endometrial Adenocarcinoma. PT has completed Chemotherapy and radiation. She had a CT scan 04/11/18 that did not find recurrent or metastatic disease. However, it did find cirrhosis. She is being followed by GI.   Diabetes  She presents for her follow-up diabetic visit. She has type 2 diabetes mellitus. Her disease course has been stable. Hypoglycemia symptoms include dizziness. Associated symptoms include fatigue and foot paresthesias. Pertinent negatives for diabetes include no blurred vision and no visual change. Symptoms are stable. Risk factors for coronary artery disease include diabetes mellitus, hypertension, sedentary lifestyle and post-menopausal. She is following a generally healthy diet. Her overall blood glucose range is 110-130 mg/dl. Eye exam is current.  Hyperlipidemia  This is a chronic problem. The current episode started more than 1 year ago. Recent lipid tests were reviewed and are normal. Current antihyperlipidemic treatment includes statins and herbal therapy. The current treatment provides moderate improvement of lipids. Risk factors for coronary artery disease include dyslipidemia, diabetes mellitus, hypertension, post-menopausal and a sedentary lifestyle.  Hypertension  This is a chronic problem. The current episode started more than 1 year ago. The problem has been resolved since onset. The problem is controlled. Associated symptoms include malaise/fatigue and peripheral edema ("a little"). Pertinent negatives include no blurred vision. Risk factors for coronary artery disease include dyslipidemia, diabetes mellitus and sedentary lifestyle. The current treatment provides moderate improvement. Identifiable causes of hypertension include a  thyroid problem.  Dizziness  This is a chronic problem. The current episode started more than 1 year ago. The problem occurs intermittently. The problem has been waxing and waning. Associated symptoms include fatigue. Pertinent negatives include no visual change. The symptoms are aggravated by bending. She has tried rest (antivert) for the symptoms. The treatment provided mild relief.  Thyroid Problem  Presents for follow-up visit. Symptoms include fatigue and hoarse voice. Patient reports no depressed mood or visual change. The symptoms have been stable. Her past medical history is significant for hyperlipidemia.      Review of Systems  Constitutional: Positive for fatigue and malaise/fatigue.  HENT: Positive for hoarse voice.   Eyes: Negative for blurred vision.  Neurological: Positive for dizziness.  All other systems reviewed and are negative.      Objective:   Physical Exam  Constitutional: She is oriented to person, place, and time. She appears well-developed and well-nourished. No distress.  HENT:  Head: Normocephalic and atraumatic.  Right Ear: External ear normal.  Left Ear: External ear normal.  Mouth/Throat: Oropharynx is clear and moist.  Eyes: Pupils are equal, round, and reactive to light.  Neck: Normal range of motion. Neck supple. No thyromegaly present.  Cardiovascular: Normal rate, regular rhythm, normal heart sounds and intact distal pulses.  No murmur heard. Pulmonary/Chest: Effort normal and breath sounds normal. No respiratory distress. She has no wheezes.  Abdominal: Soft. Bowel sounds are normal. She exhibits no distension. There is no tenderness.  Musculoskeletal: Normal range of motion. She exhibits edema (trace BLE). She exhibits no tenderness.  Neurological: She is alert and oriented to person, place, and time. She has normal reflexes. No cranial nerve deficit.  Skin: Skin is warm and dry.  Psychiatric: She has a normal mood and affect. Her behavior is  normal. Judgment and thought content normal.  Vitals reviewed.  Diabetic Foot Exam - Simple   Simple Foot Form Diabetic Foot exam was performed with the following findings:  Yes 06/01/2018  8:44 AM  Visual Inspection No deformities, no ulcerations, no other skin breakdown bilaterally:  Yes Sensation Testing Intact to touch and monofilament testing bilaterally:  Yes Pulse Check Posterior Tibialis and Dorsalis pulse intact bilaterally:  Yes Comments       BP 100/68   Pulse 66   Temp (!) 97.1 F (36.2 C) (Oral)   Ht _0  (1.549 m)   Wt 147 lb (66.7 kg)   BMI 27.78 kg/m      Assessment & Plan:  GRAYCIE HALLEY comes in today with chief complaint of Medical Management of Chronic Issues   Diagnosis and orders addressed:  1. Hypertension associated with diabetes ([place]) -We will stop Clonidine. She is to decrease to 0.05 mg BID for 3 days then decrease to one 0.05 mg  For 3 days and then stop RTO in 2 weeks to recheck HTN - CBC with Differential/Platelet - CMP14+EGFR  2. Type 2 diabetes mellitus without complication, without long-term current use of insulin ([facility]) - ONETOUCH DELICA LANCETS 28Z MISC; USE 1 TO CHECK GLUCOSE ONCE DAILY  Dispense: 100 each; Refill: 1 - glucose blood (ONETOUCH VERIO) test strip; USE TO CHECK GLUCOSE DAILY  Dispense: 100 each; Refill: 3 - CBC with Differential/Platelet - CMP14+EGFR - Bayer DCA Hb A1c Waived - Microalbumin / creatinine urine ratio  3. Hypothyroidism, unspecified type - CBC with Differential/Platelet - CMP14+EGFR  4. Hyperlipidemia associated with type 2 diabetes mellitus ([place]) - CBC with Differential/Platelet - CMP14+EGFR - Lipid panel  5. Obesity (BMI 30-39.9) - CBC with Differential/Platelet - CMP14+EGFR  6. Endometrial cancer, FIGO stage IIIA ([facility]) - CBC with Differential/Platelet - CMP14+EGFR  7. B12 deficiency - CBC with Differential/Platelet - CMP14+EGFR - Vitamin B12  8. Vitamin D deficiency - CBC with  Differential/Platelet - CMP14+EGFR - VITAMIN D 25 Hydroxy (Vit-D Deficiency, Fractures)  9. Endometrial adenocarcinoma ([place]) - CBC with Differential/Platelet - CMP14+EGFR  10. Dizziness - We will tamper Clonidine today Follow up in 2 weeks  - meclizine (ANTIVERT) 25 MG tablet; Take 1 tablet (25 mg total) by mouth 2 (two) times daily as needed for dizziness.  Dispense: 30 tablet; Refill: 1 - CBC with Differential/Platelet - CMP14+EGFR   Labs pending Health Maintenance reviewed Diet and exercise encouraged  Follow up plan: 2 weeks to recheck HTN   Evelina Dun, FNP

## 2018-06-01 NOTE — Addendum Note (Signed)
Addended by: Ilean China on: 06/01/2018 10:12 AM   Modules accepted: Orders

## 2018-06-01 NOTE — Addendum Note (Signed)
Addended by: Shelbie Ammons on: 06/01/2018 03:01 PM   Modules accepted: Orders

## 2018-06-01 NOTE — Telephone Encounter (Signed)
Labs for [facility]

## 2018-06-02 ENCOUNTER — Other Ambulatory Visit: Payer: Self-pay | Admitting: Family

## 2018-06-02 LAB — CMP14+EGFR
ALT: 18 IU/L (ref 0–32)
AST: 21 IU/L (ref 0–40)
Albumin/Globulin Ratio: 2.2 (ref 1.2–2.2)
Albumin: 4.4 g/dL (ref 3.5–4.8)
Alkaline Phosphatase: 40 IU/L (ref 39–117)
BUN/Creatinine Ratio: 20 (ref 12–28)
BUN: 19 mg/dL (ref 8–27)
Bilirubin Total: 0.6 mg/dL (ref 0.0–1.2)
CALCIUM: 9.8 mg/dL (ref 8.7–10.3)
CO2: 25 mmol/L (ref 20–29)
Chloride: 100 mmol/L (ref 96–106)
Creatinine, Ser: 0.96 mg/dL (ref 0.57–1.00)
GFR calc Af Amer: 67 mL/min/{1.73_m2} (ref >59)
GFR, EST NON AFRICAN AMERICAN: 58 mL/min/{1.73_m2} — AB (ref >59)
Globulin, Total: 2 g/dL (ref 1.5–4.5)
Glucose: 124 mg/dL — ABNORMAL HIGH (ref 65–99)
Potassium: 4.4 mmol/L (ref 3.5–5.2)
Sodium: 143 mmol/L (ref 134–144)
Total Protein: 6.4 g/dL (ref 6.0–8.5)

## 2018-06-02 LAB — LIPID PANEL
Chol/HDL Ratio: 2.9 ratio (ref 0.0–4.4)
Cholesterol, Total: 108 mg/dL (ref 100–199)
HDL: 37 mg/dL — ABNORMAL LOW (ref >39)
LDL CALC: 45 mg/dL (ref 0–99)
Triglycerides: 129 mg/dL (ref 0–149)
VLDL Cholesterol Cal: 26 mg/dL (ref 5–40)

## 2018-06-02 LAB — VITAMIN B12: Vitamin B-12: 952 pg/mL (ref 232–1245)

## 2018-06-02 LAB — CBC WITH DIFFERENTIAL/PLATELET
Basophils Absolute: 0 10*3/uL (ref 0.0–0.2)
Basos: 0 %
EOS (ABSOLUTE): 0.1 10*3/uL (ref 0.0–0.4)
EOS: 2 %
Hematocrit: 32.1 % — ABNORMAL LOW (ref 34.0–46.6)
Hemoglobin: 11 g/dL — ABNORMAL LOW (ref 11.1–15.9)
Immature Grans (Abs): 0 10*3/uL (ref 0.0–0.1)
Immature Granulocytes: 0 %
Lymphocytes Absolute: 1.6 10*3/uL (ref 0.7–3.1)
Lymphs: 34 %
MCH: 33.2 pg — ABNORMAL HIGH (ref 26.6–33.0)
MCHC: 34.3 g/dL (ref 31.5–35.7)
MCV: 97 fL (ref 79–97)
Monocytes Absolute: 0.3 10*3/uL (ref 0.1–0.9)
Monocytes: 6 %
NEUTROS PCT: 58 %
Neutrophils Absolute: 2.8 10*3/uL (ref 1.4–7.0)
Platelets: 142 10*3/uL — ABNORMAL LOW (ref 150–450)
RBC: 3.31 x10E6/uL — ABNORMAL LOW (ref 3.77–5.28)
RDW: 12.7 % (ref 12.3–15.4)
WBC: 4.8 10*3/uL (ref 3.4–10.8)

## 2018-06-02 LAB — VITAMIN D 25 HYDROXY (VIT D DEFICIENCY, FRACTURES): Vit D, 25-Hydroxy: 48.4 ng/mL (ref 30.0–100.0)

## 2018-06-02 MED ORDER — METFORMIN HCL 500 MG PO TABS: 500.0000 mg | ORAL_TABLET | Freq: Two times a day (BID) | ORAL | 1 refills | Status: DC

## 2018-06-03 LAB — CERULOPLASMIN: Ceruloplasmin: 23.1 mg/dL (ref 19.0–39.0)

## 2018-06-03 LAB — IRON AND TIBC
Iron Saturation: 25 % (ref 15–55)
Iron: 79 ug/dL (ref 27–139)
Total Iron Binding Capacity: 317 ug/dL (ref 250–450)
UIBC: 238 ug/dL (ref 118–369)

## 2018-06-03 LAB — SEDIMENTATION RATE: Sed Rate: 5 mm/hr (ref 0–40)

## 2018-06-03 LAB — ANA W/REFLEX IF POSITIVE: Anti Nuclear Antibody(ANA): NEGATIVE

## 2018-06-03 LAB — ANTI-SMOOTH MUSCLE ANTIBODY, IGG: Smooth Muscle Ab: 12 Units (ref 0–19)

## 2018-06-03 LAB — FERRITIN: Ferritin: 26 ng/mL (ref 15–150)

## 2018-06-03 LAB — IGG: IgG (Immunoglobin G), Serum: 865 mg/dL (ref 700–1600)

## 2018-06-09 DIAGNOSIS — M79676 Pain in unspecified toe(s): Secondary | ICD-10-CM | POA: Diagnosis not present

## 2018-06-09 DIAGNOSIS — B351 Tinea unguium: Secondary | ICD-10-CM | POA: Diagnosis not present

## 2018-06-09 DIAGNOSIS — E1142 Type 2 diabetes mellitus with diabetic polyneuropathy: Secondary | ICD-10-CM | POA: Diagnosis not present

## 2018-06-09 DIAGNOSIS — L84 Corns and callosities: Secondary | ICD-10-CM | POA: Diagnosis not present

## 2018-06-11 ENCOUNTER — Ambulatory Visit: Payer: Self-pay | Admitting: *Deleted

## 2018-06-11 DIAGNOSIS — I152 Hypertension secondary to endocrine disorders: Secondary | ICD-10-CM

## 2018-06-11 DIAGNOSIS — E1169 Type 2 diabetes mellitus with other specified complication: Secondary | ICD-10-CM

## 2018-06-11 DIAGNOSIS — E785 Hyperlipidemia, unspecified: Secondary | ICD-10-CM

## 2018-06-11 DIAGNOSIS — R42 Dizziness and giddiness: Secondary | ICD-10-CM

## 2018-06-11 DIAGNOSIS — E119 Type 2 diabetes mellitus without complications: Secondary | ICD-10-CM

## 2018-06-11 DIAGNOSIS — E1159 Type 2 diabetes mellitus with other circulatory complications: Secondary | ICD-10-CM

## 2018-06-11 DIAGNOSIS — I10 Essential (primary) hypertension: Principal | ICD-10-CM

## 2018-06-11 NOTE — Patient Instructions (Signed)
Please call me if you need anything to help manage your medical conditions in the future.  Chong Sicilian, RN-BC, BSN Nurse Case Manager [facility] Ph: [phone]

## 2018-06-11 NOTE — Chronic Care Management (AMB) (Addendum)
  Chronic Care Management   Note  06/11/2018 Name: Alexandria Irwin MRN: [id number] DOB: 1946-06-08  Follow up call made to [name] to discuss potential CCM enrollment. She recently finished cancer treatments and followed up with GI regarding newly diagnosed non-alcoholic cirrhosis. She stated, "I don't see that I need any help right now. I have been going to a lot of doctors over the past year and I was looking forward to not being so busy". We discussed the dizziness, which was a focus at her last visit. She stated that it has improved with the meclizine. She is aware that it will make her drowsy and is moving cautiously to avoid falls. I explained CCM services to her again. Since she does not have a current problem with health management, she did not see the benefit of CCM.  PLAN Previously scheduled appointment for initial CCM visit canceled per patient's request.  Patient will call me with any future concerns regarding her chronic medical conditions. Will reevaluate the need for CCM services at that time and at any point in the future that the need arises.      Chong Sicilian, RN-BC, BSN Nurse Case Manager [facility] Ph: [phone]   I have reviewed and agree with the above  documentation.   Evelina Dun, FNP

## 2018-06-15 ENCOUNTER — Ambulatory Visit: Payer: Medicare Other

## 2018-06-25 ENCOUNTER — Other Ambulatory Visit: Payer: Self-pay | Admitting: Family

## 2018-06-26 ENCOUNTER — Ambulatory Visit (INDEPENDENT_AMBULATORY_CARE_PROVIDER_SITE_OTHER): Payer: Medicare Other | Admitting: *Deleted

## 2018-06-26 DIAGNOSIS — E538 Deficiency of other specified B group vitamins: Secondary | ICD-10-CM

## 2018-06-26 MED ORDER — CYANOCOBALAMIN 1000 MCG/ML IJ SOLN
1000.0000 ug | INTRAMUSCULAR | Status: AC
  Administered 2018-06-26 – 2019-02-23 (×8): 1000 ug via INTRAMUSCULAR

## 2018-06-26 NOTE — Progress Notes (Signed)
Vitamin b12 injection given and patient tolerated well.  

## 2018-06-26 NOTE — Patient Instructions (Signed)
Cyanocobalamin, Vitamin B12 injection What is this medicine? CYANOCOBALAMIN (sye an oh koe BAL a min) is a man made form of vitamin B12. Vitamin B12 is used in the growth of healthy blood cells, nerve cells, and proteins in the body. It also helps with the metabolism of fats and carbohydrates. This medicine is used to treat people who can not absorb vitamin B12. This medicine may be used for other purposes; ask your health care provider or pharmacist if you have questions. COMMON BRAND NAME(S): B-12 Compliance Kit, B-12 Injection Kit, Cyomin, LA-12, Nutri-Twelve, Physicians EZ Use B-12, Primabalt What should I tell my health care provider before I take this medicine? They need to know if you have any of these conditions: -kidney disease -Leber's disease -megaloblastic anemia -an unusual or allergic reaction to cyanocobalamin, cobalt, other medicines, foods, dyes, or preservatives -pregnant or trying to get pregnant -breast-feeding How should I use this medicine? This medicine is injected into a muscle or deeply under the skin. It is usually given by a health care professional in a clinic or doctor's office. However, your doctor may teach you how to inject yourself. Follow all instructions. Talk to your pediatrician regarding the use of this medicine in children. Special care may be needed. Overdosage: If you think you have taken too much of this medicine contact a poison control center or emergency room at once. NOTE: This medicine is only for you. Do not share this medicine with others. What if I miss a dose? If you are given your dose at a clinic or doctor's office, call to reschedule your appointment. If you give your own injections and you miss a dose, take it as soon as you can. If it is almost time for your next dose, take only that dose. Do not take double or extra doses. What may interact with this medicine? -colchicine -heavy alcohol intake This list may not describe all possible  interactions. Give your health care provider a list of all the medicines, herbs, non-prescription drugs, or dietary supplements you use. Also tell them if you smoke, drink alcohol, or use illegal drugs. Some items may interact with your medicine. What should I watch for while using this medicine? Visit your doctor or health care professional regularly. You may need blood work done while you are taking this medicine. You may need to follow a special diet. Talk to your doctor. Limit your alcohol intake and avoid smoking to get the best benefit. What side effects may I notice from receiving this medicine? Side effects that you should report to your doctor or health care professional as soon as possible: -allergic reactions like skin rash, itching or hives, swelling of the face, lips, or tongue -blue tint to skin -chest tightness, pain -difficulty breathing, wheezing -dizziness -red, swollen painful area on the leg Side effects that usually do not require medical attention (report to your doctor or health care professional if they continue or are bothersome): -diarrhea -headache This list may not describe all possible side effects. Call your doctor for medical advice about side effects. You may report side effects to FDA at 1-800-FDA-1088. Where should I keep my medicine? Keep out of the reach of children. Store at room temperature between 15 and 30 degrees C (59 and 85 degrees F). Protect from light. Throw away any unused medicine after the expiration date. NOTE: This sheet is a summary. It may not cover all possible information. If you have questions about this medicine, talk to your doctor, pharmacist, or   health care provider.  2019 Elsevier/Gold Standard (2007-09-28 22:10:20)  

## 2018-06-28 ENCOUNTER — Other Ambulatory Visit: Payer: Self-pay | Admitting: Family

## 2018-06-28 DIAGNOSIS — R42 Dizziness and giddiness: Secondary | ICD-10-CM

## 2018-06-29 ENCOUNTER — Other Ambulatory Visit: Payer: Self-pay | Admitting: Family

## 2018-07-02 ENCOUNTER — Encounter: Payer: Self-pay | Admitting: Family Medicine

## 2018-07-02 ENCOUNTER — Ambulatory Visit (INDEPENDENT_AMBULATORY_CARE_PROVIDER_SITE_OTHER): Payer: Medicare Other | Admitting: Family Medicine

## 2018-07-02 VITALS — BP 156/84 | HR 67 | Temp 97.0°F | Ht 61.0 in | Wt 151.6 lb

## 2018-07-02 DIAGNOSIS — J0110 Acute frontal sinusitis, unspecified: Secondary | ICD-10-CM | POA: Diagnosis not present

## 2018-07-02 MED ORDER — AMOXICILLIN 500 MG PO CAPS: 500.0000 mg | ORAL_CAPSULE | Freq: Two times a day (BID) | ORAL | 0 refills | Status: DC

## 2018-07-02 NOTE — Progress Notes (Signed)
BP (!) 156/84   Pulse 67   Temp (!) 97 F (36.1 C) (Oral)   Ht 5\' 1"  (1.549 m)   Wt 151 lb 9.6 oz (68.8 kg)   SpO2 99%   BMI 28.64 kg/m    Subjective:    Patient ID: Alexandria Irwin, female    DOB: 1945/08/18, 73 y.o.   MRN: [id number]  HPI: Alexandria Irwin is a 73 y.o. female presenting on 07/02/2018 for Cough (x 4 days- OTC mucinex ); Nasal Congestion; and Sore Throat   HPI Cough and nasal congestion and sinus congestion and sore throat Patient has been having 4 days of cough and nasal congestion and sore throat.  She denies any fevers or chills or shortness of breath or wheezing but has been have a lot of postnasal drainage and sinus congestion and coughing.  She has been using over-the-counter Mucinex which has been helping some.  She has recently in the past year had cancer and chemotherapy and that is why she is concerned because her immune system is just not what it normally is.  Relevant past medical, surgical, family and social history reviewed and updated as indicated. Interim medical history since our last visit reviewed. Allergies and medications reviewed and updated.  Review of Systems  Constitutional: Negative for chills and fever.  HENT: Positive for congestion, postnasal drip, rhinorrhea, sinus pressure and sore throat. Negative for ear discharge, ear pain and sneezing.   Eyes: Negative for pain, redness and visual disturbance.  Respiratory: Positive for cough. Negative for chest tightness and shortness of breath.   Cardiovascular: Negative for chest pain and leg swelling.  Musculoskeletal: Negative for back pain and gait problem.  Skin: Negative for rash.  Neurological: Negative for light-headedness and headaches.  Psychiatric/Behavioral: Negative for agitation and behavioral problems.  All other systems reviewed and are negative.   Per HPI unless specifically indicated above   Allergies as of 07/02/2018      Reactions   Ace Inhibitors Cough   Ciprocin-fluocin-procin [fluocinolone] Hives   Norvasc [amlodipine] Cough   Latex Rash   Neosporin [neomycin-bacitracin Zn-polymyx] Rash   Sulfa Antibiotics Rash      Medication List       Accurate as of July 02, 2018 11:59 PM. Always use your most recent med list.        acetaminophen 500 MG tablet Commonly known as:  TYLENOL Take 1,000 mg by mouth every 8 (eight) hours as needed for mild pain or headache.   amoxicillin 500 MG capsule Commonly known as:  AMOXIL Take 1 capsule (500 mg total) by mouth 2 (two) times daily.   aspirin EC 81 MG tablet Take 81 mg by mouth daily.   [employer] CALCIUM CITRATE +D PO Take 2 tablets by mouth every evening.   cyanocobalamin 1000 MCG/ML injection Commonly known as:  (VITAMIN B-12) Inject 1,000 mcg into the muscle every 30 (thirty) days.   cycloSPORINE 0.05 % ophthalmic emulsion Commonly known as:  RESTASIS Place 1 drop into both eyes 2 (two) times daily.   D 2000 50 MCG (2000 UT) Tabs Generic drug:  Cholecalciferol Take 2,000 Units by mouth daily.   Fish Oil 1000 MG Caps Take 1,000 mg by mouth every evening.   furosemide 40 MG tablet Commonly known as:  LASIX Take 1 tablet (40 mg total) by mouth daily.   glucose blood test strip Commonly known as:  ONETOUCH VERIO USE TO CHECK GLUCOSE DAILY   JANUVIA 50 MG tablet Generic drug:  sitaGLIPtin TAKE 1 TABLET BY MOUTH ONCE DAILY   levothyroxine 100 MCG tablet Commonly known as:  SYNTHROID, LEVOTHROID TAKE 1 TABLET BY MOUTH ONCE DAILY   losartan 100 MG tablet Commonly known as:  COZAAR TAKE 1 TABLET BY MOUTH ONCE DAILY   Magnesium 250 MG Tabs Take 250 mg by mouth every evening.   meclizine 25 MG tablet Commonly known as:  ANTIVERT TAKE 1 TABLET BY MOUTH TWICE DAILY FOR DIZZINESS   metFORMIN 500 MG tablet Commonly known as:  GLUCOPHAGE Take 1 tablet (500 mg total) by mouth 2 (two) times daily with a meal.   metoprolol succinate 50 MG 24 hr tablet Commonly known as:   TOPROL-XL TAKE 1 TABLET BY MOUTH TWICE DAILY WITH MEALS   ONETOUCH DELICA LANCETS 00X Misc USE 1 TO CHECK GLUCOSE ONCE DAILY   simvastatin 40 MG tablet Commonly known as:  ZOCOR TAKE 1 TABLET BY MOUTH ONCE DAILY AT  6PM   vitamin C 500 MG tablet Commonly known as:  ASCORBIC ACID Take 500 mg by mouth daily.          Objective:    BP (!) 156/84   Pulse 67   Temp (!) 97 F (36.1 C) (Oral)   Ht 5\' 1"  (1.549 m)   Wt 151 lb 9.6 oz (68.8 kg)   SpO2 99%   BMI 28.64 kg/m   Wt Readings from Last 3 Encounters:  07/02/18 151 lb 9.6 oz (68.8 kg)  06/01/18 147 lb (66.7 kg)  04/29/18 147 lb 8 oz (66.9 kg)    Physical Exam Vitals signs reviewed.  Constitutional:      General: She is not in acute distress.    Appearance: She is well-developed. She is not diaphoretic.  HENT:     Right Ear: Tympanic membrane, ear canal and external ear normal.     Left Ear: Tympanic membrane, ear canal and external ear normal.     Nose: Mucosal edema and rhinorrhea present.     Right Sinus: Frontal sinus tenderness present. No maxillary sinus tenderness.     Left Sinus: Frontal sinus tenderness present. No maxillary sinus tenderness.     Mouth/Throat:     Pharynx: Uvula midline. Posterior oropharyngeal erythema present. No oropharyngeal exudate.     Tonsils: No tonsillar abscesses.  Eyes:     Conjunctiva/sclera: Conjunctivae normal.  Cardiovascular:     Rate and Rhythm: Normal rate and regular rhythm.     Heart sounds: Normal heart sounds. No murmur.  Pulmonary:     Effort: Pulmonary effort is normal. No respiratory distress.     Breath sounds: Normal breath sounds. No wheezing.  Musculoskeletal: Normal range of motion.        General: No tenderness.  Skin:    General: Skin is warm and dry.     Findings: No rash.  Neurological:     Mental Status: She is alert and oriented to person, place, and time.     Coordination: Coordination normal.  Psychiatric:        Behavior: Behavior normal.          Assessment & Plan:   Problem List Items Addressed This Visit    None    Visit Diagnoses    Acute non-recurrent frontal sinusitis    -  Primary   Recently within the past year for cancer and had chemo, will treat with amoxicillin because of her immunosuppressed state   Relevant Medications   amoxicillin (AMOXIL) 500 MG capsule  Follow up plan: Return if symptoms worsen or fail to improve.  Counseling provided for all of the vaccine components No orders of the defined types were placed in this encounter.   Caryl Pina, MD [facility] 07/05/2018, 9:27 PM

## 2018-07-27 ENCOUNTER — Ambulatory Visit (INDEPENDENT_AMBULATORY_CARE_PROVIDER_SITE_OTHER): Payer: Medicare Other | Admitting: *Deleted

## 2018-07-27 DIAGNOSIS — E538 Deficiency of other specified B group vitamins: Secondary | ICD-10-CM

## 2018-07-27 NOTE — Progress Notes (Signed)
Pt given Cyanocobalamin inj Tolerated well 

## 2018-07-30 ENCOUNTER — Inpatient Hospital Stay: Payer: Medicare Other | Attending: Gynecologic Oncology | Admitting: Gynecologic Oncology

## 2018-07-30 ENCOUNTER — Encounter: Payer: Self-pay | Admitting: Gynecologic Oncology

## 2018-07-30 VITALS — BP 133/70 | HR 68 | Temp 98.2°F | Resp 20 | Ht 61.0 in | Wt 157.0 lb

## 2018-07-30 DIAGNOSIS — Z90722 Acquired absence of ovaries, bilateral: Secondary | ICD-10-CM | POA: Insufficient documentation

## 2018-07-30 DIAGNOSIS — Z90710 Acquired absence of both cervix and uterus: Secondary | ICD-10-CM | POA: Insufficient documentation

## 2018-07-30 DIAGNOSIS — Z87891 Personal history of nicotine dependence: Secondary | ICD-10-CM | POA: Diagnosis not present

## 2018-07-30 DIAGNOSIS — C541 Malignant neoplasm of endometrium: Secondary | ICD-10-CM | POA: Insufficient documentation

## 2018-07-30 NOTE — Progress Notes (Signed)
Follow-up Note: Gyn-Onc  Consult was requested by Dr. Deatra Ina for the evaluation of Alexandria Irwin 73 y.o. female  CC:  Chief Complaint  Patient presents with  . Endometrial adenocarcinoma [facility])    Assessment/Plan:  Ms. Alexandria Irwin  is a 73 y.o.  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 Genetic testing negative.  S/p adjuvant carboplatin and paclitaxel chemotherapy x 6 cycles with adjuvant vaginal brachytherapy for local control completed in June, 2019.  Complete clinical response.   I will see Letonia back in 6 months. She will see Dr Sondra Come in 3 months.   HPI: Ms. Alexandria Irwin is a 73 year old para 4 who was seen in consultation at the request of Dr. Deatra Ina for grade 1 endometrial cancer.  The patient has a history of postmenopausal bleeding since November 2017.  At this time she saw her primary care doctor who diagnosed a urinary tract infection and treated her intermittently with antibiotics for approximately 3 months.  However given the persistence of the symptoms she then sought evaluation with her gynecologist Dr. Deatra Ina in April 2018.  At this time a transvaginal ultrasound scan was performed which showed some fibroids in the uterus but no thickening of the endometrium.  Pap smear and pelvic examination were unremarkable and therefore it was felt that the source of bleeding was not gynecologic.  She was then sent to Dr. Noah Delaine from [facility]rology who performed an evaluation including a CT scan of the abdomen and pelvis in September 2018.  This showed grossly normal urologic system and no evidence of extrauterine disease however the uterus was slightly 4 cm intrauterine mass concerning for neoplasm.  Dr. Alyson Ingles then performed a cystoscopy which was also unremarkable.  She was referred back to Dr. Deatra Ina for further evaluation.  In October 2018 Dr. Deatra Ina repeated the pelvic ultrasound scan  which again confirmed what appeared to be a thin endometrium with fibroids.  However given the persistence of bleeding and the CT findings he took the patient to the operating room on June 13, 2017 and performed a hysteroscopy D&C and polypectomy.  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  The patient otherwise has medical history significant for a DVT in the right lower extremity approximately 12 years ago that was spontaneous.  She was treated with aspirin 325 mg daily but no anti-thrombotic agents.  She has had no recurrences since that time.  She has a history of type 2 diabetes mellitus with fairly poorly controlled with an HbA1c earlier in 2018 of 9.2%.  At that time she is taking only metformin.  She was then added Januvia.  Follow-up HbA1c in November 2018 had reduced to 7.2%.  The patient reports fasting blood glucose levels between 07/31/1950.  Her primary care doctor Dr. Lenna Gilford manages her blood glucose.  Patient also has hypertension hypercholesterolemia.  She denies a coronary artery disease history.  She has had 4 prior vaginal deliveries.  She has no family history concerning for Lynch syndrome.  She is obese with a height of 5 foot 2 inches and a weight of 174 pounds.  Her prior abdominal surgery includes a laparoscopic cholecystectomy.  She has an umbilical hernia. On 08/05/17 she underwent a robotic assisted total hysterectomy, BSO, sentinel lymph node biopsy.  Surgery also included an umbilical hernia repair.  The final pathology revealed a uterus with a FIGO grade 2 endometrioid adenocarcinoma involving the full-thickness of the  posterior myometrium with tumor present at the inked serosal surface.  There was also microscopic involvement of the left fallopian tube.  Sentinel lymph nodes bilaterally were negative as was the cervix.  Lymphovascular space invasion was not identified.  The patient was staged as having stage III a grade 2 endometrioid adenocarcinoma  and was recommended to proceed with adjuvant therapy with carboplatin paclitaxel chemotherapy and vaginal brachytherapy.  She demonstrated loss of mL H1 nuclear expression on her pathology and therefore genetics consultation was recommended, but no genetic mutations were identified on comprehensive testing in March, 2019.   Interval Hx:  She went on to complete 6 cycles of carb/taxol chemotherapy (completed June, 2019) and 30Gy (in 5 fractions) of vaginal brachytherapy. She tolerated therapy well.  Post treatment imaging with CT abd/pelvis on 01/13/18 showed bilateral lymphocysts measuring up to 5cm. These had slightly increased from postop imaging. Her bladder wall was also thickened. No definitive recurrent/persistent tumor. Follow-up CT imaging in October, 2019 showed reduction in size of lymphocysts.   The patient reports a history of a right DVT "years ago" and chronic right LE edema since that time. She has mild intermittent left foot and ankle swelling.    Current Meds:  Outpatient Encounter Medications as of 07/30/2018  Medication Sig  . acetaminophen (TYLENOL) 500 MG tablet Take 1,000 mg by mouth every 8 (eight) hours as needed for mild pain or headache.  Marland Kitchen aspirin EC 81 MG tablet Take 81 mg by mouth daily.  . Calcium Citrate-Vitamin D ([employer]S CALCIUM CITRATE +D PO) Take 2 tablets by mouth every evening.   . Cholecalciferol (D 2000) 2000 units TABS Take 2,000 Units by mouth daily.  . cyanocobalamin (,VITAMIN B-12,) 1000 MCG/ML injection Inject 1,000 mcg into the muscle every 30 (thirty) days.   . cycloSPORINE (RESTASIS) 0.05 % ophthalmic emulsion Place 1 drop into both eyes 2 (two) times daily.   . furosemide (LASIX) 40 MG tablet Take 1 tablet (40 mg total) by mouth daily.  Marland Kitchen glucose blood (ONETOUCH VERIO) test strip USE TO CHECK GLUCOSE DAILY  . JANUVIA 50 MG tablet TAKE 1 TABLET BY MOUTH ONCE DAILY  . levothyroxine (SYNTHROID, LEVOTHROID) 100 MCG tablet TAKE 1 TABLET BY MOUTH ONCE  DAILY  . losartan (COZAAR) 100 MG tablet TAKE 1 TABLET BY MOUTH ONCE DAILY  . Magnesium 250 MG TABS Take 250 mg by mouth every evening.   . meclizine (ANTIVERT) 25 MG tablet TAKE 1 TABLET BY MOUTH TWICE DAILY FOR DIZZINESS  . metFORMIN (GLUCOPHAGE) 500 MG tablet Take 1 tablet (500 mg total) by mouth 2 (two) times daily with a meal.  . metoprolol succinate (TOPROL-XL) 50 MG 24 hr tablet TAKE 1 TABLET BY MOUTH TWICE DAILY WITH MEALS  . Omega-3 Fatty Acids (FISH OIL) 1000 MG CAPS Take 1,000 mg by mouth every evening.  Glory Rosebush DELICA LANCETS 76E MISC USE 1 TO CHECK GLUCOSE ONCE DAILY  . simvastatin (ZOCOR) 40 MG tablet TAKE 1 TABLET BY MOUTH ONCE DAILY AT  6PM  . vitamin C (ASCORBIC ACID) 500 MG tablet Take 500 mg by mouth daily.  . [DISCONTINUED] amoxicillin (AMOXIL) 500 MG capsule Take 1 capsule (500 mg total) by mouth 2 (two) times daily.  . [DISCONTINUED] metoprolol (LOPRESSOR) 50 MG tablet TAKE 1 TABLET (50 MG TOTAL) BY MOUTH 2 (TWO) TIMES DAILY.   Facility-Administered Encounter Medications as of 07/30/2018  Medication  . cyanocobalamin ((VITAMIN B-12)) injection 1,000 mcg    Allergy:  Allergies  Allergen Reactions  .  Ace Inhibitors Cough  . Ciprocin-Fluocin-Procin [Fluocinolone] Hives  . Norvasc [Amlodipine] Cough  . Latex Rash  . Neosporin [Neomycin-Bacitracin Zn-Polymyx] Rash  . Sulfa Antibiotics Rash    Social Hx:   Social History   Socioeconomic History  . Marital status: Married    Spouse name: Not on file  . Number of children: Not on file  . Years of education: Not on file  . Highest education level: Not on file  Occupational History  . Occupation: retired  Scientific laboratory technician  . Financial resource strain: Not on file  . Food insecurity:    Worry: Not on file    Inability: Not on file  . Transportation needs:    Medical: No    Non-medical: No  Tobacco Use  . Smoking status: Never Smoker  . Smokeless tobacco: Never Used  Substance and Sexual Activity  . Alcohol  use: No  . Drug use: No  . Sexual activity: Yes    Birth control/protection: Post-menopausal  Lifestyle  . Physical activity:    Days per week: Not on file    Minutes per session: Not on file  . Stress: Not on file  Relationships  . Social connections:    Talks on phone: Not on file    Gets together: Not on file    Attends religious service: Not on file    Active member of club or organization: Not on file    Attends meetings of clubs or organizations: Not on file    Relationship status: Not on file  . Intimate partner violence:    Fear of current or ex partner: Not on file    Emotionally abused: Not on file    Physically abused: Not on file    Forced sexual activity: Not on file  Other Topics Concern  . Not on file  Social History Narrative  . Not on file    Past Surgical Hx:  Past Surgical History:  Procedure Laterality Date  . BREAST BIOPSY Left 2009   benign  . CATARACT EXTRACTION W/ INTRAOCULAR LENS IMPLANT Left 2018  . CATARACT EXTRACTION W/PHACO Right 03/14/2014   Procedure: CATARACT EXTRACTION PHACO AND INTRAOCULAR LENS PLACEMENT (IOC);  Surgeon: Tonny Branch, MD;  Location: [facility];  Service: Ophthalmology;  Laterality: Right;  CDE: 8.34  . FOOT GANGLION EXCISION Right 2013  . HYSTEROSCOPY W/D&C N/A 06/13/2017   Procedure: DILATATION AND CURETTAGE /HYSTEROSCOPY;  Surgeon: Alden Hipp, MD;  Location: [facility];  Service: Gynecology;  Laterality: N/A;  . IR FLUORO GUIDE PORT INSERTION RIGHT  08/27/2017  . IR REMOVAL TUN ACCESS W/ PORT W/O FL MOD SED  04/27/2018  . IR US GUIDE VASC ACCESS RIGHT  08/27/2017  . LAPAROSCOPIC CHOLECYSTECTOMY  2005  . ROBOTIC ASSISTED TOTAL HYSTERECTOMY WITH BILATERAL SALPINGO OOPHERECTOMY Bilateral 08/05/2017   Procedure: ROBOTIC ASSISTED TOTAL HYSTERECTOMY WITH BILATERAL SALPINGO OOPHORECTOMY;  Surgeon: Everitt Amber, MD;  Location: [facility];  Service: Gynecology;  Laterality: Bilateral;  . SENTINEL NODE BIOPSY Bilateral 08/05/2017   Procedure:  SENTINEL NODE BIOPSY;  Surgeon: Everitt Amber, MD;  Location: [facility];  Service: Gynecology;  Laterality: Bilateral;  . UMBILICAL HERNIA REPAIR N/A 08/05/2017   Procedure: HERNIA REPAIR UMBILICAL ADULT;  Surgeon: Everitt Amber, MD;  Location: [facility];  Service: Gynecology;  Laterality: N/A;  . VEIN SURGERY Right 2011   vein stripping-leg  and laser treatment    Past Medical Hx:  Past Medical History:  Diagnosis Date  . Cataract   . Endometrial adenocarcinoma ([place])   .  Environmental and seasonal allergies   . Family history of ovarian cancer   . Family history of pancreatic cancer   . Genetic testing 09/19/2017   Multi-Cancer panel (83 genes) @ [facility] - No pathogenic mutations detected  . GERD (gastroesophageal reflux disease)   . History of DVT of lower extremity    early 2000s--- lower right leg  . Hyperlipidemia   . Hypertension   . Hypothyroidism   . Type 2 diabetes mellitus ([place])    followed by pcp  . Umbilical hernia   . Vitamin B 12 deficiency   . Vitamin D deficiency   . Wears glasses     Past Gynecological History:  SVD x 4 No LMP recorded. Patient has had a hysterectomy.  Family Hx:  Family History  Problem Relation Age of Onset  . Pancreatic cancer Mother 72       deceased 90  . Ovarian cancer Mother        dx 54s  . Cancer Other 20       maternal half-sister; unk. type    Review of Systems:  Constitutional  Feels well,    ENT Normal appearing ears and nares bilaterally Skin/Breast  No rash, sores, jaundice, itching, dryness Cardiovascular  No chest pain, shortness of breath, or edema  Pulmonary  No cough or wheeze.  Gastro Intestinal  No nausea, vomitting, or diarrhoea. No bright red blood per rectum, no abdominal pain, change in bowel movement, or constipation.  Genito Urinary  No frequency, urgency, dysuria,no bleeding Musculo Skeletal  No myalgia, arthralgia, joint swelling or pain  Neurologic  No weakness, numbness, change in gait,  Psychology   No depression, anxiety, insomnia.   Vitals:  Blood pressure 133/70, pulse 68, temperature 98.2 F (36.8 C), temperature source Oral, resp. rate 20, height '5\' 1"'  (1.549 m), weight 157 lb (71.2 kg), SpO2 98 %.  Physical Exam: WD in NAD Neck  Supple NROM, without any enlargements.  Lymph Node Survey No cervical supraclavicular or inguinal adenopathy Cardiovascular  Pulse normal rate, regularity and rhythm. S1 and S2 normal.  Lungs  Clear to auscultation bilateraly, without wheezes/crackles/rhonchi. Good air movement.  Skin  No rash/lesions/breakdown  Psychiatry  Alert and oriented to person, place, and time  Abdomen  Normoactive bowel sounds, abdomen soft, non-tender and obese without hernia. Well healed incisions.  Back No CVA tenderness Genito Urinary  Vulva/vagina: vaginal cuff healed, no mucosal changes. No palpable masses. Rectal  deferred Extremities  No bilateral cyanosis, clubbing or edema.     Thereasa Solo, MD  07/30/2018, 2:38 PM

## 2018-07-30 NOTE — Patient Instructions (Signed)
Please notify Dr Denman George at phone number [phone] if you notice vaginal bleeding, new pelvic or abdominal pains, bloating, feeling full easy, or a change in bladder or bowel function.   Please contact Dr Serita Grit office (at [phone]) in April, 2020 to request an appointment with her for July, 2020 (or you may ask Dr Clabe Seal office staff to schedule this for you after your appointment with him in April).

## 2018-08-12 ENCOUNTER — Other Ambulatory Visit: Payer: Self-pay | Admitting: Family

## 2018-08-12 DIAGNOSIS — R42 Dizziness and giddiness: Secondary | ICD-10-CM

## 2018-08-17 MED ORDER — MECLIZINE HCL 25 MG PO TABS: ORAL_TABLET | ORAL | 0 refills | Status: DC

## 2018-08-17 NOTE — Addendum Note (Signed)
Addended by: Antonietta Barcelona D on: 08/17/2018 03:39 PM   Modules accepted: Orders

## 2018-08-17 NOTE — Telephone Encounter (Signed)
Refill failed. resent °

## 2018-08-18 DIAGNOSIS — E1142 Type 2 diabetes mellitus with diabetic polyneuropathy: Secondary | ICD-10-CM | POA: Diagnosis not present

## 2018-08-18 DIAGNOSIS — M79676 Pain in unspecified toe(s): Secondary | ICD-10-CM | POA: Diagnosis not present

## 2018-08-18 DIAGNOSIS — L84 Corns and callosities: Secondary | ICD-10-CM | POA: Diagnosis not present

## 2018-08-18 DIAGNOSIS — B351 Tinea unguium: Secondary | ICD-10-CM | POA: Diagnosis not present

## 2018-08-24 ENCOUNTER — Other Ambulatory Visit: Payer: Self-pay | Admitting: Family

## 2018-08-26 ENCOUNTER — Ambulatory Visit: Payer: Medicare Other

## 2018-08-27 ENCOUNTER — Ambulatory Visit: Payer: Medicare Other

## 2018-08-28 ENCOUNTER — Ambulatory Visit (INDEPENDENT_AMBULATORY_CARE_PROVIDER_SITE_OTHER): Payer: Medicare Other | Admitting: *Deleted

## 2018-08-28 DIAGNOSIS — E538 Deficiency of other specified B group vitamins: Secondary | ICD-10-CM | POA: Diagnosis not present

## 2018-08-28 NOTE — Progress Notes (Signed)
Pt given Cyanocobalamin inj Tolerated well 

## 2018-09-21 ENCOUNTER — Other Ambulatory Visit: Payer: Self-pay | Admitting: Family

## 2018-09-21 NOTE — Telephone Encounter (Signed)
Last seen 07/02/2018  Last thyroid 08/26/17

## 2018-09-25 ENCOUNTER — Other Ambulatory Visit: Payer: Self-pay

## 2018-09-25 ENCOUNTER — Ambulatory Visit (INDEPENDENT_AMBULATORY_CARE_PROVIDER_SITE_OTHER): Payer: Medicare Other | Admitting: *Deleted

## 2018-09-25 DIAGNOSIS — E538 Deficiency of other specified B group vitamins: Secondary | ICD-10-CM

## 2018-09-25 NOTE — Progress Notes (Signed)
Pt given Cyanocobalamin inj Tolerated well 

## 2018-10-26 ENCOUNTER — Encounter: Payer: Self-pay | Admitting: Radiation Oncology

## 2018-10-26 ENCOUNTER — Other Ambulatory Visit: Payer: Self-pay

## 2018-10-26 ENCOUNTER — Ambulatory Visit
Admission: RE | Admit: 2018-10-26 | Discharge: 2018-10-26 | Disposition: A | Discharge status: Home / Self Care | Payer: Medicare Other | Source: Ambulatory Visit | Attending: Radiation Oncology | Admitting: Radiation Oncology

## 2018-10-26 VITALS — BP 133/72 | HR 72 | Temp 98.1°F | Resp 20 | Ht 61.0 in | Wt 167.4 lb

## 2018-10-26 DIAGNOSIS — R140 Abdominal distension (gaseous): Secondary | ICD-10-CM | POA: Insufficient documentation

## 2018-10-26 DIAGNOSIS — Z8542 Personal history of malignant neoplasm of other parts of uterus: Secondary | ICD-10-CM | POA: Diagnosis not present

## 2018-10-26 DIAGNOSIS — C541 Malignant neoplasm of endometrium: Secondary | ICD-10-CM

## 2018-10-26 DIAGNOSIS — R350 Frequency of micturition: Secondary | ICD-10-CM | POA: Insufficient documentation

## 2018-10-26 DIAGNOSIS — Z923 Personal history of irradiation: Secondary | ICD-10-CM | POA: Insufficient documentation

## 2018-10-26 DIAGNOSIS — Z7984 Long term (current) use of oral hypoglycemic drugs: Secondary | ICD-10-CM | POA: Insufficient documentation

## 2018-10-26 DIAGNOSIS — Z79899 Other long term (current) drug therapy: Secondary | ICD-10-CM | POA: Diagnosis not present

## 2018-10-26 DIAGNOSIS — Z7982 Long term (current) use of aspirin: Secondary | ICD-10-CM | POA: Diagnosis not present

## 2018-10-26 DIAGNOSIS — Z08 Encounter for follow-up examination after completed treatment for malignant neoplasm: Secondary | ICD-10-CM | POA: Diagnosis not present

## 2018-10-26 NOTE — Patient Instructions (Signed)
Coronavirus (COVID-19) Are you at risk?  Are you at risk for the Coronavirus (COVID-19)?  To be considered HIGH RISK for Coronavirus (COVID-19), you have to meet the following criteria:  . Traveled to [place], [place], [place], [place] or [place]; or in the [place] to [place], [place], [place], or [place]; and have fever, cough, and shortness of breath within the last 2 weeks of travel OR . Been in close contact with a person diagnosed with COVID-19 within the last 2 weeks and have fever, cough, and shortness of breath . IF YOU DO NOT MEET THESE CRITERIA, YOU ARE CONSIDERED LOW RISK FOR COVID-19.  What to do if you are HIGH RISK for COVID-19?  . If you are having a medical emergency, call 911. . Seek medical care right away. Before you go to a doctor's office, urgent care or emergency department, call ahead and tell them about your recent travel, contact with someone diagnosed with COVID-19, and your symptoms. You should receive instructions from your physician's office regarding next steps of care.  . When you arrive at healthcare provider, tell the healthcare staff immediately you have returned from visiting [place], [place], [place], [place] or [place]; or traveled in the [place] to [place], [place], [place], or [place]; in the last two weeks or you have been in close contact with a person diagnosed with COVID-19 in the last 2 weeks.   . Tell the health care staff about your symptoms: fever, cough and shortness of breath. . After you have been seen by a medical provider, you will be either: o Tested for (COVID-19) and discharged home on quarantine except to seek medical care if symptoms worsen, and asked to  - Stay home and avoid contact with others until you get your results (4-5 days)  - Avoid travel on public transportation if possible (such as bus, train, or airplane) or o Sent to the Emergency Department by EMS for evaluation, COVID-19 testing, and possible  admission depending on your condition and test results.  What to do if you are LOW RISK for COVID-19?  Reduce your risk of any infection by using the same precautions used for avoiding the common cold or flu:  . Wash your hands often with soap and warm water for at least 20 seconds.  If soap and water are not readily available, use an alcohol-based hand sanitizer with at least 60% alcohol.  . If coughing or sneezing, cover your mouth and nose by coughing or sneezing into the elbow areas of your shirt or coat, into a tissue or into your sleeve (not your hands). . Avoid shaking hands with others and consider head nods or verbal greetings only. . Avoid touching your eyes, nose, or mouth with unwashed hands.  . Avoid close contact with people who are sick. . Avoid places or events with large numbers of people in one location, like concerts or sporting events. . Carefully consider travel plans you have or are making. . If you are planning any travel outside or inside the [place], visit the CDC's Travelers' Health webpage for the latest health notices. . If you have some symptoms but not all symptoms, continue to monitor at home and seek medical attention if your symptoms worsen. . If you are having a medical emergency, call 911.   ADDITIONAL HEALTHCARE OPTIONS FOR PATIENTS  [facility] / e-Visit: [email or web address]         [facility]: [phone]  [facility]: [phone]                   [facility]: [phone]   

## 2018-10-26 NOTE — Progress Notes (Signed)
Radiation Oncology         [phone] ________________________________  Name: Alexandria Irwin MRN: [id number]  Date: 10/26/2018  DOB: [birth year]  Follow-Up Visit Note  CC: Sharion Balloon, FNP  Everitt Amber, MD    ICD-10-CM   1. Endometrial cancer, FIGO stage IIIA (HCC) C54.1     Diagnosis:   73 y.o. female with Stage IIIA Endometrioid adenocarcinoma of the endometrium with loss of MLH1 nuclear expression, Grade 2  Interval Since Last Radiation:  1 year  Radiation treatment dates:09/18/2017-10/13/2017  Site/dose:Vaginal cuff / 30 Gy in 5 fractions  Narrative:  The patient returns today for routine follow-up. She underwent an abdominal ultrasound as part of her GI workup on May 07, 2018 which showed s/p cholecystectomy: mildly prominent common bile duct, up to 9 mm, but similar to recent CT, not felt to represent obstruction. Hepatic changes consistent with the clinical history of cirrhosis. No focal lesion.  On review of systems, she denies c/o pain. Pt reports urinary frequency, denies dysuria/hematuria. Pt denies vaginal bleeding/discharge. Pt denies rectal bleeding, diarrhea/constipation. Pt denies N/V. Pt reports occasional abdominal bloating and attributes that to diet/PO intake.                      ALLERGIES:  is allergic to ace inhibitors; ciprocin-fluocin-procin [fluocinolone]; norvasc [amlodipine]; latex; neosporin [neomycin-bacitracin zn-polymyx]; and sulfa antibiotics.  Meds: Current Outpatient Medications  Medication Sig Dispense Refill  . acetaminophen (TYLENOL) 500 MG tablet Take 1,000 mg by mouth every 8 (eight) hours as needed for mild pain or headache.    Marland Kitchen aspirin EC 81 MG tablet Take 81 mg by mouth daily.    . Calcium Citrate-Vitamin D (CVS CALCIUM CITRATE +D PO) Take 2 tablets by mouth every evening.     . Cholecalciferol (D 2000) 2000 units TABS Take 2,000 Units by mouth daily.    . cyanocobalamin (,VITAMIN B-12,) 1000 MCG/ML injection Inject 1,000  mcg into the muscle every 30 (thirty) days.     . cycloSPORINE (RESTASIS) 0.05 % ophthalmic emulsion Place 1 drop into both eyes 2 (two) times daily.     . furosemide (LASIX) 40 MG tablet Take 1 tablet by mouth once daily 90 tablet 0  . glucose blood (ONETOUCH VERIO) test strip USE TO CHECK GLUCOSE DAILY 100 each 3  . JANUVIA 50 MG tablet TAKE 1 TABLET BY MOUTH ONCE DAILY 90 tablet 0  . levothyroxine (SYNTHROID, LEVOTHROID) 100 MCG tablet Take 1 tablet by mouth once daily 90 tablet 0  . losartan (COZAAR) 100 MG tablet TAKE 1 TABLET BY MOUTH ONCE DAILY 90 tablet 1  . Magnesium 250 MG TABS Take 250 mg by mouth every evening.     . meclizine (ANTIVERT) 25 MG tablet TAKE 1 TABLET BY MOUTH TWICE DAILY FOR DIZZINESS 30 tablet 0  . metFORMIN (GLUCOPHAGE) 500 MG tablet Take 1 tablet (500 mg total) by mouth 2 (two) times daily with a meal. 180 tablet 1  . metoprolol succinate (TOPROL-XL) 50 MG 24 hr tablet TAKE 1 TABLET BY MOUTH TWICE DAILY WITH MEALS 180 tablet 1  . Omega-3 Fatty Acids (FISH OIL) 1000 MG CAPS Take 1,000 mg by mouth every evening.    Glory Rosebush DELICA LANCETS 56C MISC USE 1 TO CHECK GLUCOSE ONCE DAILY 100 each 1  . simvastatin (ZOCOR) 40 MG tablet TAKE 1 TABLET BY MOUTH ONCE DAILY AT 6PM 90 tablet 1  . vitamin C (ASCORBIC ACID) 500 MG tablet Take 500 mg  by mouth daily.     Current Facility-Administered Medications  Medication Dose Route Frequency Provider Last Rate Last Dose  . cyanocobalamin ((VITAMIN B-12)) injection 1,000 mcg  1,000 mcg Intramuscular Q30 days Evelina Dun A, FNP   1,000 mcg at 09/25/18 1020    Physical Findings: The patient is in no acute distress. Patient is alert and oriented.  height is '5\' 1"'  (1.549 m) and weight is 167 lb 6.4 oz (75.9 kg). Her oral temperature is 98.1 F (36.7 C). Her blood pressure is 133/72 and her pulse is 72. Her respiration is 20 and oxygen saturation is 100%.   Lungs are clear to auscultation bilaterally. Heart has regular rate and  rhythm. No palpable cervical, supraclavicular, or axillary adenopathy. Abdomen soft, non-tender, normal bowel sounds.  On pelvic examination the external genitalia were unremarkable. A speculum exam was performed. There are no mucosal lesions noted in the vaginal vault. Vaginal cuff intact. On bimanual  examination there were no pelvic masses appreciated.   Lab Findings: Lab Results  Component Value Date   WBC 4.8 06/01/2018   HGB 11.0 (L) 06/01/2018   HCT 32.1 (L) 06/01/2018   MCV 97 06/01/2018   PLT 142 (L) 06/01/2018                                                               Radiographic Findings: No results found.  Impression:   No evidence of recurrence on clinical exam.   Plan:  She will meet with Dr. Denman George in 3 months and follow up in radiation oncology in 6 months.   ____________________________________  Blair Promise, PhD, MD  This document serves as a record of services personally performed by Gery Pray, MD. It was created on his behalf by Mary-Margaret Loma Messing, a trained medical scribe. The creation of this record is based on the scribe's personal observations and the provider's statements to them. This document has been checked and approved by the attending provider.

## 2018-10-26 NOTE — Progress Notes (Signed)
Pt presents today for f/u with Dr. Sondra Come. Pt denies c/o pain. Pt reports urinary frequency, denies dysuria/hematuria. Pt denies vaginal bleeding/discharge. Pt denies rectal bleeding, diarrhea/constipation. Pt denies N/V. Pt reports occasional abdominal bloating and attributes that to diet/PO intake.  BP 133/72 (BP Location: Left Arm, Patient Position: Sitting)   Pulse 72   Temp 98.1 F (36.7 C) (Oral)   Resp 20   Ht 5\' 1"  (1.549 m)   Wt 167 lb 6.4 oz (75.9 kg)   SpO2 100%   BMI 31.63 kg/m   Wt Readings from Last 3 Encounters:  10/26/18 167 lb 6.4 oz (75.9 kg)  07/30/18 157 lb (71.2 kg)  07/02/18 151 lb 9.6 oz (68.8 kg)   Loma Sousa, RN BSN

## 2018-10-27 ENCOUNTER — Ambulatory Visit (INDEPENDENT_AMBULATORY_CARE_PROVIDER_SITE_OTHER): Payer: Medicare Other | Admitting: *Deleted

## 2018-10-27 DIAGNOSIS — E538 Deficiency of other specified B group vitamins: Secondary | ICD-10-CM | POA: Diagnosis not present

## 2018-10-29 ENCOUNTER — Encounter (INDEPENDENT_AMBULATORY_CARE_PROVIDER_SITE_OTHER): Payer: Self-pay | Admitting: Internal Medicine

## 2018-10-29 ENCOUNTER — Ambulatory Visit (INDEPENDENT_AMBULATORY_CARE_PROVIDER_SITE_OTHER): Payer: Medicare Other | Admitting: Internal Medicine

## 2018-10-29 ENCOUNTER — Other Ambulatory Visit: Payer: Self-pay

## 2018-10-29 DIAGNOSIS — K7460 Unspecified cirrhosis of liver: Secondary | ICD-10-CM | POA: Diagnosis not present

## 2018-10-29 NOTE — Progress Notes (Signed)
Subjective:    Patient ID: Alexandria Irwin, female    DOB: Jan 30, 1946, 73 y.o.   MRN: [id number]   Start time 1123am Ended 1135am.  Total time 12 minutes.  PCP Sharlot Gowda FNP Patient consent to this telephone OV. She is at home. I am in the office.  Telephone OV due to risk of COVID-19.  Unable to do video OV. HPI Reason for visit. Cirrhosis. Seen as a new patient in October of 2019. She is doing good well. Hx of endometrial cancer in remission. Her appetite is good. Her weight now is 167. Last weight in October was 147. She is not retaining fluid. Her appetite is good.  BMs are moving okay. No melena or BRRB   . Underwent a CT 01/13/2018 (endometrial cancer diagnosed in 2018. Chemo and radiation complete).  4. Subtle irregular hepatic capsule, again suspicious for cirrhosis. CT 03/13/2017 shwed slightly sunken appearance and nodular contour, suggesting early changes of cirrhosis.  Auto immune process ruled out.  06/01/2018 sedrate 9, ceruloplasmin 23.1, TIBC 317, UIBC 238, Iron 79, Iron sat 25, Igg 865 (normal), ferritin 26, SMA 12, ANA negative. Acute hepatitis panel negative.   05/07/2018 [place] RUQ: cirrhosis  IMPRESSION: Status post cholecystectomy. Mildly prominent common bile duct, up to 9 mm, but similar to recent CT, not felt to represent obstruction.  Hepatic changes consistent with the clinical history of cirrhosis. No focal lesion.   Review of Systems Past Medical History:  Diagnosis Date  . Cataract   . Endometrial adenocarcinoma ([place])   . Environmental and seasonal allergies   . Family history of ovarian cancer   . Family history of pancreatic cancer   . Genetic testing 09/19/2017   Multi-Cancer panel (83 genes) @ [facility] - No pathogenic mutations detected  . GERD (gastroesophageal reflux disease)   . History of DVT of lower extremity    early 2000s--- lower right leg  . Hyperlipidemia   . Hypertension   . Hypothyroidism   . Type 2 diabetes mellitus ([place])    followed by pcp  . Umbilical hernia   . Vitamin B 12 deficiency   . Vitamin D deficiency   . Wears glasses     Past Surgical History:  Procedure Laterality Date  . BREAST BIOPSY Left 2009   benign  . CATARACT EXTRACTION W/ INTRAOCULAR LENS IMPLANT Left 2018  . CATARACT EXTRACTION W/PHACO Right 03/14/2014   Procedure: CATARACT EXTRACTION PHACO AND INTRAOCULAR LENS PLACEMENT (IOC);  Surgeon: Tonny Branch, MD;  Location: [facility];  Service: Ophthalmology;  Laterality: Right;  CDE: 8.34  . FOOT GANGLION EXCISION Right 2013  . HYSTEROSCOPY W/D&C N/A 06/13/2017   Procedure: DILATATION AND CURETTAGE /HYSTEROSCOPY;  Surgeon: Alden Hipp, MD;  Location: [facility];  Service: Gynecology;  Laterality: N/A;  . IR FLUORO GUIDE PORT INSERTION RIGHT  08/27/2017  . IR REMOVAL TUN ACCESS W/ PORT W/O FL MOD SED  04/27/2018  . IR US GUIDE VASC ACCESS RIGHT  08/27/2017  . LAPAROSCOPIC CHOLECYSTECTOMY  2005  . ROBOTIC ASSISTED TOTAL HYSTERECTOMY WITH BILATERAL SALPINGO OOPHERECTOMY Bilateral 08/05/2017   Procedure: ROBOTIC ASSISTED TOTAL HYSTERECTOMY WITH BILATERAL SALPINGO OOPHORECTOMY;  Surgeon: Everitt Amber, MD;  Location: [facility];  Service: Gynecology;  Laterality: Bilateral;  . SENTINEL NODE BIOPSY Bilateral 08/05/2017   Procedure: SENTINEL NODE BIOPSY;  Surgeon: Everitt Amber, MD;  Location: [facility];  Service: Gynecology;  Laterality: Bilateral;  . UMBILICAL HERNIA REPAIR N/A 08/05/2017   Procedure: HERNIA REPAIR UMBILICAL ADULT;  Surgeon: Everitt Amber,  MD;  Location: [facility];  Service: Gynecology;  Laterality: N/A;  . VEIN SURGERY Right 2011   vein stripping-leg  and laser treatment    Allergies  Allergen Reactions  . Ace Inhibitors Cough  . Ciprocin-Fluocin-Procin [Fluocinolone] Hives  . Norvasc [Amlodipine] Cough  . Latex Rash  . Neosporin [Neomycin-Bacitracin Zn-Polymyx] Rash  . Sulfa Antibiotics Rash    Current Outpatient Medications on File Prior to Visit  Medication Sig Dispense Refill  .  acetaminophen (TYLENOL) 500 MG tablet Take 1,000 mg by mouth every 8 (eight) hours as needed for mild pain or headache.    Marland Kitchen aspirin EC 81 MG tablet Take 81 mg by mouth daily.    . Calcium Citrate-Vitamin D ([employer] CALCIUM CITRATE +D PO) Take 2 tablets by mouth every evening.     . Cholecalciferol (D 2000) 2000 units TABS Take 2,000 Units by mouth daily.    . cyanocobalamin (,VITAMIN B-12,) 1000 MCG/ML injection Inject 1,000 mcg into the muscle every 30 (thirty) days.     . cycloSPORINE (RESTASIS) 0.05 % ophthalmic emulsion Place 1 drop into both eyes 2 (two) times daily.     . furosemide (LASIX) 40 MG tablet Take 1 tablet by mouth once daily 90 tablet 0  . glucose blood (ONETOUCH VERIO) test strip USE TO CHECK GLUCOSE DAILY 100 each 3  . JANUVIA 50 MG tablet TAKE 1 TABLET BY MOUTH ONCE DAILY 90 tablet 0  . levothyroxine (SYNTHROID, LEVOTHROID) 100 MCG tablet Take 1 tablet by mouth once daily 90 tablet 0  . losartan (COZAAR) 100 MG tablet TAKE 1 TABLET BY MOUTH ONCE DAILY 90 tablet 1  . Magnesium 250 MG TABS Take 250 mg by mouth every evening.     . meclizine (ANTIVERT) 25 MG tablet TAKE 1 TABLET BY MOUTH TWICE DAILY FOR DIZZINESS 30 tablet 0  . metFORMIN (GLUCOPHAGE) 500 MG tablet Take 1 tablet (500 mg total) by mouth 2 (two) times daily with a meal. 180 tablet 1  . metoprolol succinate (TOPROL-XL) 50 MG 24 hr tablet TAKE 1 TABLET BY MOUTH TWICE DAILY WITH MEALS 180 tablet 1  . Omega-3 Fatty Acids (FISH OIL) 1000 MG CAPS Take 1,000 mg by mouth every evening.    Glory Rosebush DELICA LANCETS 02D MISC USE 1 TO CHECK GLUCOSE ONCE DAILY 100 each 1  . simvastatin (ZOCOR) 40 MG tablet TAKE 1 TABLET BY MOUTH ONCE DAILY AT 6PM 90 tablet 1  . vitamin C (ASCORBIC ACID) 500 MG tablet Take 500 mg by mouth daily.    . [DISCONTINUED] metoprolol (LOPRESSOR) 50 MG tablet TAKE 1 TABLET (50 MG TOTAL) BY MOUTH 2 (TWO) TIMES DAILY. 60 tablet 2   Current Facility-Administered Medications on File Prior to Visit   Medication Dose Route Frequency Provider Last Rate Last Dose  . cyanocobalamin ((VITAMIN B-12)) injection 1,000 mcg  1,000 mcg Intramuscular Q30 days Sharion Balloon, FNP   1,000 mcg at 10/27/18 7412        Objective:   Physical Exam Deferred.        Assessment & Plan:  Cirrhosis: NAFLD. Will get an Hepatic,CBC. OV in 1 year.  She is due for a CT in July. Will hold off on [place] RUQ.

## 2018-10-29 NOTE — Patient Instructions (Signed)
Hepatic and CBC.  OV in 1 year.

## 2018-10-31 ENCOUNTER — Other Ambulatory Visit: Payer: Self-pay | Admitting: Family

## 2018-10-31 DIAGNOSIS — R42 Dizziness and giddiness: Secondary | ICD-10-CM

## 2018-11-05 ENCOUNTER — Other Ambulatory Visit: Payer: Self-pay | Admitting: Family

## 2018-11-05 DIAGNOSIS — B351 Tinea unguium: Secondary | ICD-10-CM | POA: Diagnosis not present

## 2018-11-05 DIAGNOSIS — E1142 Type 2 diabetes mellitus with diabetic polyneuropathy: Secondary | ICD-10-CM | POA: Diagnosis not present

## 2018-11-05 DIAGNOSIS — M79676 Pain in unspecified toe(s): Secondary | ICD-10-CM | POA: Diagnosis not present

## 2018-11-05 DIAGNOSIS — L84 Corns and callosities: Secondary | ICD-10-CM | POA: Diagnosis not present

## 2018-11-06 NOTE — Telephone Encounter (Signed)
[facility]. NTBS. Approving 30 days. Last A1C 06/01/18

## 2018-11-17 ENCOUNTER — Ambulatory Visit: Payer: Medicare Other | Admitting: Family

## 2018-11-19 ENCOUNTER — Other Ambulatory Visit: Payer: Self-pay

## 2018-11-20 ENCOUNTER — Ambulatory Visit (INDEPENDENT_AMBULATORY_CARE_PROVIDER_SITE_OTHER): Payer: Medicare Other | Admitting: Family

## 2018-11-20 ENCOUNTER — Encounter: Payer: Self-pay | Admitting: Family

## 2018-11-20 VITALS — BP 127/79 | HR 69 | Temp 97.1°F | Ht 61.0 in | Wt 167.8 lb

## 2018-11-20 DIAGNOSIS — E039 Hypothyroidism, unspecified: Secondary | ICD-10-CM | POA: Diagnosis not present

## 2018-11-20 DIAGNOSIS — R42 Dizziness and giddiness: Secondary | ICD-10-CM | POA: Diagnosis not present

## 2018-11-20 DIAGNOSIS — E538 Deficiency of other specified B group vitamins: Secondary | ICD-10-CM

## 2018-11-20 DIAGNOSIS — E1169 Type 2 diabetes mellitus with other specified complication: Secondary | ICD-10-CM

## 2018-11-20 DIAGNOSIS — C541 Malignant neoplasm of endometrium: Secondary | ICD-10-CM

## 2018-11-20 DIAGNOSIS — E669 Obesity, unspecified: Secondary | ICD-10-CM | POA: Diagnosis not present

## 2018-11-20 DIAGNOSIS — E1159 Type 2 diabetes mellitus with other circulatory complications: Secondary | ICD-10-CM

## 2018-11-20 DIAGNOSIS — I10 Essential (primary) hypertension: Secondary | ICD-10-CM | POA: Diagnosis not present

## 2018-11-20 DIAGNOSIS — I152 Hypertension secondary to endocrine disorders: Secondary | ICD-10-CM

## 2018-11-20 DIAGNOSIS — E785 Hyperlipidemia, unspecified: Secondary | ICD-10-CM

## 2018-11-20 DIAGNOSIS — E119 Type 2 diabetes mellitus without complications: Secondary | ICD-10-CM

## 2018-11-20 LAB — BAYER DCA HB A1C WAIVED: HB A1C (BAYER DCA - WAIVED): 6.6 % (ref <7.0)

## 2018-11-20 MED ORDER — FUROSEMIDE 20 MG PO TABS: 20.0000 mg | ORAL_TABLET | Freq: Every day | ORAL | 3 refills | Status: DC

## 2018-11-20 MED ORDER — MECLIZINE HCL 25 MG PO TABS: 25.0000 mg | ORAL_TABLET | Freq: Two times a day (BID) | ORAL | 2 refills | Status: DC | PRN

## 2018-11-20 NOTE — Progress Notes (Signed)
Subjective:    Patient ID: Alexandria Irwin, female    DOB: 09-26-1945, 73 y.o.   MRN: [id number]  No chief complaint on file.  Pt presents to the office today for chronic follow up. [name] Endometrial Adenocarcinoma. PT has completed Chemotherapy and radiation. She had a CT scan 04/11/18 that did not find recurrent or metastatic disease. However, it did find cirrhosis. She is being followed by GI.  Diabetes  She presents for her follow-up diabetic visit. She has type 2 diabetes mellitus. Her disease course has been stable. Hypoglycemia symptoms include dizziness. Associated symptoms include fatigue and foot paresthesias. Pertinent negatives for diabetes include no blurred vision and no visual change. Symptoms are stable. Pertinent negatives for diabetic complications include no CVA, heart disease or nephropathy. Risk factors for coronary artery disease include dyslipidemia, diabetes mellitus, post-menopausal, sedentary lifestyle and hypertension. She is following a generally unhealthy diet. Her overall blood glucose range is 140-180 mg/dl.  Hypertension  This is a chronic problem. The current episode started more than 1 year ago. The problem has been resolved since onset. The problem is controlled. Associated symptoms include malaise/fatigue. Pertinent negatives include no blurred vision, peripheral edema or shortness of breath. Risk factors for coronary artery disease include dyslipidemia, diabetes mellitus, obesity and sedentary lifestyle. The current treatment provides moderate improvement. There is no history of CAD/MI, CVA or heart failure. Identifiable causes of hypertension include a thyroid problem.  Hyperlipidemia  This is a chronic problem. The current episode started more than 1 year ago. The problem is controlled. Recent lipid tests were reviewed and are normal. Exacerbating diseases include obesity. Pertinent negatives include no shortness of breath. Current antihyperlipidemic treatment  includes statins. The current treatment provides moderate improvement of lipids. Risk factors for coronary artery disease include dyslipidemia, diabetes mellitus, hypertension, a sedentary lifestyle and post-menopausal.  Thyroid Problem  Presents for follow-up visit. Symptoms include dry skin and fatigue. Patient reports no constipation, depressed mood, diarrhea, hoarse voice or visual change. The symptoms have been stable. Her past medical history is significant for hyperlipidemia. There is no history of heart failure.  Anemia  Presents for follow-up visit. Symptoms include light-headedness and malaise/fatigue. There is no history of heart failure.  Dizziness  This is a chronic problem. The current episode started more than 1 year ago. The problem occurs intermittently. Associated symptoms include fatigue. Pertinent negatives include no visual change. The symptoms are aggravated by stress.      Review of Systems  Constitutional: Positive for fatigue and malaise/fatigue.  HENT: Negative for hoarse voice.   Eyes: Negative for blurred vision.  Respiratory: Negative for shortness of breath.   Gastrointestinal: Negative for constipation and diarrhea.  Neurological: Positive for dizziness and light-headedness.  All other systems reviewed and are negative.      Objective:   Physical Exam Vitals signs reviewed.  Constitutional:      General: She is not in acute distress.    Appearance: She is well-developed.  HENT:     Head: Normocephalic and atraumatic.     Right Ear: Tympanic membrane normal.     Left Ear: Tympanic membrane normal.  Eyes:     Pupils: Pupils are equal, round, and reactive to light.  Neck:     Musculoskeletal: Normal range of motion and neck supple.     Thyroid: No thyromegaly.  Cardiovascular:     Rate and Rhythm: Normal rate and regular rhythm.     Heart sounds: Normal heart sounds. No murmur.  Pulmonary:  Effort: Pulmonary effort is normal. No respiratory  distress.     Breath sounds: Normal breath sounds. No wheezing.  Abdominal:     General: Bowel sounds are normal. There is no distension.     Palpations: Abdomen is soft.     Tenderness: There is no abdominal tenderness.  Musculoskeletal: Normal range of motion.        General: No tenderness.  Skin:    General: Skin is warm and dry.  Neurological:     Mental Status: She is alert and oriented to person, place, and time.     Cranial Nerves: No cranial nerve deficit.     Deep Tendon Reflexes: Reflexes are normal and symmetric.  Psychiatric:        Behavior: Behavior normal.        Thought Content: Thought content normal.        Judgment: Judgment normal.       BP 127/79   Pulse 69   Temp (!) 97.1 F (36.2 C) (Oral)   Ht _0  (1.549 m)   Wt 167 lb 12.8 oz (76.1 kg)   BMI 31.71 kg/m      Assessment & Plan:  Alexandria Irwin comes in today with chief complaint of Medical Management of Chronic Issues   Diagnosis and orders addressed:  1. Type 2 diabetes mellitus without complication, without long-term current use of insulin (H[facility]) - CMP14+EGFR - Bayer DCA Hb A1c Waived  2. Hypertension associated with diabetes ([place]) - CMP14+EGFR  3. Hyperlipidemia associated with type 2 diabetes mellitus ([facility]) - CMP14+EGFR  4. B12 deficiency - CMP14+EGFR  5. Endometrial adenocarcinoma ([place]) - CMP14+EGFR  6. Hypothyroidism, unspecified type - CMP14+EGFR - TSH  7. Obesity (BMI 30-39.9) - CMP14+EGFR  8. Dizziness Will decrease lasix to 20 mg from 40 mg Discussed that we may decrease this to as needed as this may be causing her dizziness. She has no history of CHF.   - furosemide (LASIX) 20 MG tablet; Take 1 tablet (20 mg total) by mouth daily.  Dispense: 30 tablet; Refill: 3 - meclizine (ANTIVERT) 25 MG tablet; Take 1 tablet (25 mg total) by mouth 2 (two) times daily as needed for dizziness.  Dispense: 60 tablet; Refill: 2 - CMP14+EGFR   Labs pending Health Maintenance  reviewed Diet and exercise encouraged  Follow up plan: 1 month to recheck dizziness  Evelina Dun, FNP

## 2018-11-20 NOTE — Patient Instructions (Signed)
Dizziness Dizziness is a common problem. It is a feeling of unsteadiness or light-headedness. You may feel like you are about to faint. Dizziness can lead to injury if you stumble or fall. Anyone can become dizzy, but dizziness is more common in older adults. This condition can be caused by a number of things, including medicines, dehydration, or illness. Follow these instructions at home: Eating and drinking  Drink enough fluid to keep your urine clear or pale yellow. This helps to keep you from becoming dehydrated. Try to drink more clear fluids, such as water.  Do not drink alcohol.  Limit your caffeine intake if told to do so by your health care provider. Check ingredients and nutrition facts to see if a food or beverage contains caffeine.  Limit your salt (sodium) intake if told to do so by your health care provider. Check ingredients and nutrition facts to see if a food or beverage contains sodium. Activity  Avoid making quick movements. ? Rise slowly from chairs and steady yourself until you feel okay. ? In the morning, first sit up on the side of the bed. When you feel okay, stand slowly while you hold onto something until you know that your balance is fine.  If you need to stand in one place for a long time, move your legs often. Tighten and relax the muscles in your legs while you are standing.  Do not drive or use heavy machinery if you feel dizzy.  Avoid bending down if you feel dizzy. Place items in your home so that they are easy for you to reach without leaning over. Lifestyle  Do not use any products that contain nicotine or tobacco, such as cigarettes and e-cigarettes. If you need help quitting, ask your health care provider.  Try to reduce your stress level by using methods such as yoga or meditation. Talk with your health care provider if you need help to manage your stress. General instructions  Watch your dizziness for any changes.  Take over-the-counter and  prescription medicines only as told by your health care provider. Talk with your health care provider if you think that your dizziness is caused by a medicine that you are taking.  Tell a friend or a family member that you are feeling dizzy. If he or she notices any changes in your behavior, have this person call your health care provider.  Keep all follow-up visits as told by your health care provider. This is important. Contact a health care provider if:  Your dizziness does not go away.  Your dizziness or light-headedness gets worse.  You feel nauseous.  You have reduced hearing.  You have new symptoms.  You are unsteady on your feet or you feel like the room is spinning. Get help right away if:  You vomit or have diarrhea and are unable to eat or drink anything.  You have problems talking, walking, swallowing, or using your arms, hands, or legs.  You feel generally weak.  You are not thinking clearly or you have trouble forming sentences. It may take a friend or family member to notice this.  You have chest pain, abdominal pain, shortness of breath, or sweating.  Your vision changes.  You have any bleeding.  You have a severe headache.  You have neck pain or a stiff neck.  You have a fever. These symptoms may represent a serious problem that is an emergency. Do not wait to see if the symptoms will go away. Get medical help   right away. Call your local emergency services (911 in the [place] Do not drive yourself to the hospital. Summary  Dizziness is a feeling of unsteadiness or light-headedness. This condition can be caused by a number of things, including medicines, dehydration, or illness.  Anyone can become dizzy, but dizziness is more common in older adults.  Drink enough fluid to keep your urine clear or pale yellow. Do not drink alcohol.  Avoid making quick movements if you feel dizzy. Monitor your dizziness for any changes. This information is not intended to  replace advice given to you by your health care provider. Make sure you discuss any questions you have with your health care provider. Document Released: 12/11/2000 Document Revised: 07/20/2016 Document Reviewed: 07/20/2016 Elsevier Interactive Patient Education  2019 [employer]  

## 2018-11-21 LAB — CBC WITH DIFFERENTIAL/PLATELET
Basophils Absolute: 0 10*3/uL (ref 0.0–0.2)
Basos: 1 %
EOS (ABSOLUTE): 0.1 10*3/uL (ref 0.0–0.4)
Eos: 2 %
Hematocrit: 35.3 % (ref 34.0–46.6)
Hemoglobin: 12.1 g/dL (ref 11.1–15.9)
Immature Grans (Abs): 0 10*3/uL (ref 0.0–0.1)
Immature Granulocytes: 0 %
Lymphocytes Absolute: 2.3 10*3/uL (ref 0.7–3.1)
Lymphs: 40 %
MCH: 32.2 pg (ref 26.6–33.0)
MCHC: 34.3 g/dL (ref 31.5–35.7)
MCV: 94 fL (ref 79–97)
Monocytes Absolute: 0.5 10*3/uL (ref 0.1–0.9)
Monocytes: 9 %
Neutrophils Absolute: 2.8 10*3/uL (ref 1.4–7.0)
Neutrophils: 48 %
Platelets: 167 10*3/uL (ref 150–450)
RBC: 3.76 x10E6/uL — ABNORMAL LOW (ref 3.77–5.28)
RDW: 13.1 % (ref 11.7–15.4)
WBC: 5.7 10*3/uL (ref 3.4–10.8)

## 2018-11-21 LAB — CMP14+EGFR
ALT: 16 IU/L (ref 0–32)
AST: 20 IU/L (ref 0–40)
Albumin/Globulin Ratio: 2 (ref 1.2–2.2)
Albumin: 4.5 g/dL (ref 3.7–4.7)
Alkaline Phosphatase: 37 IU/L — ABNORMAL LOW (ref 39–117)
BUN/Creatinine Ratio: 21 (ref 12–28)
BUN: 20 mg/dL (ref 8–27)
Bilirubin Total: 0.6 mg/dL (ref 0.0–1.2)
CO2: 25 mmol/L (ref 20–29)
Calcium: 9.8 mg/dL (ref 8.7–10.3)
Chloride: 99 mmol/L (ref 96–106)
Creatinine, Ser: 0.97 mg/dL (ref 0.57–1.00)
GFR calc Af Amer: 66 mL/min/{1.73_m2} (ref >59)
GFR calc non Af Amer: 57 mL/min/{1.73_m2} — ABNORMAL LOW (ref >59)
Globulin, Total: 2.3 g/dL (ref 1.5–4.5)
Glucose: 118 mg/dL — ABNORMAL HIGH (ref 65–99)
Potassium: 5 mmol/L (ref 3.5–5.2)
Sodium: 140 mmol/L (ref 134–144)
Total Protein: 6.8 g/dL (ref 6.0–8.5)

## 2018-11-21 LAB — TSH: TSH: 1.04 u[IU]/mL (ref 0.450–4.500)

## 2018-11-27 ENCOUNTER — Ambulatory Visit: Payer: Medicare Other

## 2018-11-30 ENCOUNTER — Telehealth: Payer: Self-pay | Admitting: *Deleted

## 2018-11-30 ENCOUNTER — Encounter: Payer: Self-pay | Admitting: Hematology and Oncology

## 2018-11-30 NOTE — Telephone Encounter (Signed)
Enid Derry from radiation called and scheduled an appt for July. She will contact the patient

## 2018-11-30 NOTE — Telephone Encounter (Signed)
Called patient to inform of fu with Dr. Denman George on 12-30-18 - arrival time- 1:30 pm, spoke with patient and she is aware of this appt.

## 2018-12-07 ENCOUNTER — Other Ambulatory Visit: Payer: Self-pay | Admitting: Family

## 2018-12-07 DIAGNOSIS — E119 Type 2 diabetes mellitus without complications: Secondary | ICD-10-CM

## 2018-12-18 ENCOUNTER — Ambulatory Visit (INDEPENDENT_AMBULATORY_CARE_PROVIDER_SITE_OTHER): Payer: Medicare Other | Admitting: *Deleted

## 2018-12-18 ENCOUNTER — Other Ambulatory Visit: Payer: Self-pay | Admitting: Family

## 2018-12-18 ENCOUNTER — Other Ambulatory Visit: Payer: Self-pay

## 2018-12-18 VITALS — Ht 61.0 in | Wt 167.8 lb

## 2018-12-18 DIAGNOSIS — Z0000 Encounter for general adult medical examination without abnormal findings: Secondary | ICD-10-CM

## 2018-12-18 NOTE — Progress Notes (Signed)
MEDICARE ANNUAL WELLNESS VISIT  12/18/2018  Telephone Visit Disclaimer This Medicare AWV was conducted by telephone due to national recommendations for restrictions regarding the COVID-19 Pandemic (e.g. social distancing).  I verified, using two identifiers, that I am speaking with Alexandria Irwin or their authorized healthcare agent. I discussed the limitations, risks, security, and privacy concerns of performing an evaluation and management service by telephone and the potential availability of an in-person appointment in the future. The patient expressed understanding and agreed to proceed.   Subjective:  Alexandria Irwin is a 73 y.o. female patient of Hawks, [name], FNP who had a Medicare Annual Wellness Visit today via telephone. Alexandria Irwin is Retired and lives with their spouse. she has 4 children. she reports that she is socially active and does interact with friends/family regularly. she is minimally physically active and enjoys computer games and watching hallmark movies.  Patient Care Team: Sharion Balloon, FNP as PCP - General (Family Medicine)  Advanced Directives 12/18/2018 10/26/2018 07/30/2018 04/27/2018 04/23/2018 01/14/2018 11/20/2017  Does Patient Have a Medical Advance Directive? No No No No No No No  Would patient like information on creating a medical advance directive? Yes (MAU/Ambulatory/Procedural Areas - Information given) - No - Patient declined No - Patient declined - No - Patient declined No - Patient declined    Hospital Utilization Over the Past 12 Months: # of hospitalizations or ER visits: 0 # of surgeries: 0  Review of Systems    Patient reports that her overall health is unchanged compared to last year.  Patient Reported Readings (BP, Pulse, CBG, Weight, etc) CBG- 122  Review of Systems: History obtained from chart review General ROS: negative  All other systems negative.  Pain Assessment Pain : No/denies pain     Current Medications & Allergies  (verified) Allergies as of 12/18/2018      Reactions   Ace Inhibitors Cough   Ciprocin-fluocin-procin [fluocinolone] Hives   Norvasc [amlodipine] Cough   Latex Rash   Neosporin [neomycin-bacitracin Zn-polymyx] Rash   Sulfa Antibiotics Rash      Medication List       Accurate as of December 18, 2018  9:13 AM. If you have any questions, ask your nurse or doctor.        acetaminophen 500 MG tablet Commonly known as: TYLENOL Take 1,000 mg by mouth every 8 (eight) hours as needed for mild pain or headache.   aspirin EC 81 MG tablet Take 81 mg by mouth daily.   [employer] CALCIUM CITRATE +D PO Take 2 tablets by mouth every evening.   cyanocobalamin 1000 MCG/ML injection Commonly known as: (VITAMIN B-12) Inject 1,000 mcg into the muscle every 30 (thirty) days.   cycloSPORINE 0.05 % ophthalmic emulsion Commonly known as: RESTASIS Place 1 drop into both eyes 2 (two) times daily.   D 2000 50 MCG (2000 UT) Tabs Generic drug: Cholecalciferol Take 2,000 Units by mouth daily.   Fish Oil 1000 MG Caps Take 1,000 mg by mouth every evening.   furosemide 20 MG tablet Commonly known as: LASIX Take 1 tablet (20 mg total) by mouth daily.   glucose blood test strip Commonly known as: OneTouch Verio USE TO CHECK GLUCOSE DAILY   Januvia 50 MG tablet Generic drug: sitaGLIPtin TAKE 1 TABLET BY MOUTH ONCE DAILY (NEEDS  TO  BE  SEEN  BEFORE  NEXT  REFILL)   levothyroxine 100 MCG tablet Commonly known as: SYNTHROID Take 1 tablet by mouth once daily  losartan 100 MG tablet Commonly known as: COZAAR TAKE 1 TABLET BY MOUTH ONCE DAILY   Magnesium 250 MG Tabs Take 250 mg by mouth every evening.   meclizine 25 MG tablet Commonly known as: ANTIVERT Take 1 tablet (25 mg total) by mouth 2 (two) times daily as needed for dizziness.   metFORMIN 500 MG tablet Commonly known as: GLUCOPHAGE TAKE 1 TABLET BY MOUTH TWICE DAILY WITH A MEAL   metoprolol succinate 50 MG 24 hr tablet Commonly known as:  TOPROL-XL TAKE 1 TABLET BY MOUTH TWICE DAILY WITH MEALS   OneTouch Delica Plus [id number] Misc USE 1  TO CHECK GLUCOSE ONCE DAILY   simvastatin 40 MG tablet Commonly known as: ZOCOR TAKE 1 TABLET BY MOUTH ONCE DAILY AT 6PM   vitamin C 500 MG tablet Commonly known as: ASCORBIC ACID Take 500 mg by mouth daily.       History (reviewed): Past Medical History:  Diagnosis Date  . Cataract   . Endometrial adenocarcinoma ([place])   . Environmental and seasonal allergies   . Family history of ovarian cancer   . Family history of pancreatic cancer   . Genetic testing 09/19/2017   Multi-Cancer panel (83 genes) @ [facility] - No pathogenic mutations detected  . GERD (gastroesophageal reflux disease)   . History of DVT of lower extremity    early 2000s--- lower right leg  . Hyperlipidemia   . Hypertension   . Hypothyroidism   . Type 2 diabetes mellitus ([place])    followed by pcp  . Umbilical hernia   . Vitamin B 12 deficiency   . Vitamin D deficiency   . Wears glasses    Past Surgical History:  Procedure Laterality Date  . BREAST BIOPSY Left 2009   benign  . CATARACT EXTRACTION W/ INTRAOCULAR LENS IMPLANT Left 2018  . CATARACT EXTRACTION W/PHACO Right 03/14/2014   Procedure: CATARACT EXTRACTION PHACO AND INTRAOCULAR LENS PLACEMENT (IOC);  Surgeon: Tonny Branch, MD;  Location: [facility];  Service: Ophthalmology;  Laterality: Right;  CDE: 8.34  . FOOT GANGLION EXCISION Right 2013  . HYSTEROSCOPY W/D&C N/A 06/13/2017   Procedure: DILATATION AND CURETTAGE /HYSTEROSCOPY;  Surgeon: Alden Hipp, MD;  Location: [facility];  Service: Gynecology;  Laterality: N/A;  . IR FLUORO GUIDE PORT INSERTION RIGHT  08/27/2017  . IR REMOVAL TUN ACCESS W/ PORT W/O FL MOD SED  04/27/2018  . IR US GUIDE VASC ACCESS RIGHT  08/27/2017  . LAPAROSCOPIC CHOLECYSTECTOMY  2005  . ROBOTIC ASSISTED TOTAL HYSTERECTOMY WITH BILATERAL SALPINGO OOPHERECTOMY Bilateral 08/05/2017   Procedure: ROBOTIC ASSISTED TOTAL HYSTERECTOMY WITH  BILATERAL SALPINGO OOPHORECTOMY;  Surgeon: Everitt Amber, MD;  Location: [facility];  Service: Gynecology;  Laterality: Bilateral;  . SENTINEL NODE BIOPSY Bilateral 08/05/2017   Procedure: SENTINEL NODE BIOPSY;  Surgeon: Everitt Amber, MD;  Location: [facility];  Service: Gynecology;  Laterality: Bilateral;  . UMBILICAL HERNIA REPAIR N/A 08/05/2017   Procedure: HERNIA REPAIR UMBILICAL ADULT;  Surgeon: Everitt Amber, MD;  Location: [facility];  Service: Gynecology;  Laterality: N/A;  . VEIN SURGERY Right 2011   vein stripping-leg  and laser treatment   Family History  Problem Relation Age of Onset  . Pancreatic cancer Mother 51       deceased 49  . Ovarian cancer Mother        dx 71s  . Cancer Other 36       maternal half-sister; unk. type   Social History   Socioeconomic History  . Marital status: Married  Spouse name: Johnny  . Number of children: 4  . Years of education: Not on file  . Highest education level: Some college, no degree  Occupational History  . Occupation: retired  Scientific laboratory technician  . Financial resource strain: Not hard at all  . Food insecurity    Worry: Never true    Inability: Never true  . Transportation needs    Medical: No    Non-medical: No  Tobacco Use  . Smoking status: Never Smoker  . Smokeless tobacco: Never Used  Substance and Sexual Activity  . Alcohol use: No  . Drug use: No  . Sexual activity: Yes    Birth control/protection: Post-menopausal  Lifestyle  . Physical activity    Days per week: 0 days    Minutes per session: 0 min  . Stress: Not at all  Relationships  . Social connections    Talks on phone: More than three times a week    Gets together: More than three times a week    Attends religious service: Never    Active member of club or organization: No    Attends meetings of clubs or organizations: Never    Relationship status: Married  Other Topics Concern  . Not on file  Social History Narrative  . Not on file    Activities of Daily Living  In your present state of health, do you have any difficulty performing the following activities: 12/18/2018 04/27/2018  Hearing? N N  Vision? N N  Difficulty concentrating or making decisions? N N  Walking or climbing stairs? N N  Dressing or bathing? N N  Doing errands, shopping? N -  Preparing Food and eating ? N -  Using the Toilet? N -  In the past six months, have you accidently leaked urine? N -  Do you have problems with loss of bowel control? N -  Managing your Medications? N -  Managing your Finances? N -  Housekeeping or managing your Housekeeping? N -  Some recent data might be hidden    Patient Literacy How often do you need to have someone help you when you read instructions, pamphlets, or other written materials from your doctor or pharmacy?: 1 - Never What is the last grade level you completed in school?: Some College  Exercise    Diet Patient reports consuming 3 meals a day and 2 snack(s) a day Patient reports that her primary diet is: Regular Patient reports that she does have regular access to food.   Depression Screen PHQ 2/9 Scores 12/18/2018 11/20/2018 07/02/2018 06/01/2018 04/14/2018 12/22/2017 11/20/2017  PHQ - 2 Score 0 0 0 0 0 0 0     Fall Risk Fall Risk  12/18/2018 11/20/2018 07/02/2018 06/01/2018 04/14/2018  Falls in the past year? 0 0 0 0 No  Number falls in past yr: 0 - - - -  Injury with Fall? 0 - - - -     Objective:  Alexandria Irwin seemed alert and oriented and she participated appropriately during our telephone visit.  Blood Pressure Weight BMI  BP Readings from Last 3 Encounters:  11/20/18 127/79  10/26/18 133/72  07/30/18 133/70   Wt Readings from Last 3 Encounters:  12/18/18 167 lb 12.8 oz (76.1 kg)  11/20/18 167 lb 12.8 oz (76.1 kg)  10/26/18 167 lb 6.4 oz (75.9 kg)   BMI Readings from Last 1 Encounters:  12/18/18 31.71 kg/m    *Unable to obtain current vital signs, weight, and BMI due  to telephone visit type  Hearing/Vision  . Alexandria Irwin  did not seem to have difficulty with hearing/understanding during the telephone conversation . Reports that she has had a formal eye exam by an eye care professional within the past year . Reports that she has not had a formal hearing evaluation within the past year *Unable to fully assess hearing and vision during telephone visit type  Cognitive Function: 6CIT Screen 12/18/2018  What Year? 0 points  What month? 0 points  What time? 0 points  Count back from 20 0 points  Months in reverse 0 points  Repeat phrase 0 points  Total Score 0   (Normal:0-7, Significant for Dysfunction: >8)  Normal Cognitive Function Screening: Yes   Immunization & Health Maintenance Record Immunization History  Administered Date(s) Administered  . Influenza Whole 08/02/2011  . Influenza,inj,Quad PF,6+ Mos 05/04/2013, 04/23/2014, 04/17/2015  . Influenza-Unspecified 01/30/2016  . Pneumococcal Conjugate-13 04/22/2017  . Pneumococcal Polysaccharide-23 06/01/2018  . Tdap 12/12/2018    Health Maintenance  Topic Date Due  . COLONOSCOPY  07/04/1993  . COLON CANCER SCREENING ANNUAL FOBT  01/22/2018  . INFLUENZA VACCINE  01/30/2019  . OPHTHALMOLOGY EXAM  05/20/2019  . HEMOGLOBIN A1C  05/23/2019  . FOOT EXAM  06/02/2019  . TETANUS/TDAP  12/11/2028  . DEXA SCAN  Completed  . Hepatitis C Screening  Completed  . PNA vac Low Risk Adult  Completed       Assessment  This is a routine wellness examination for Alexandria Irwin.  Health Maintenance: Due or Overdue Health Maintenance Due  Topic Date Due  . COLONOSCOPY  07/04/1993  . COLON CANCER SCREENING ANNUAL FOBT  01/22/2018    Alexandria Irwin does not need a referral for Community Assistance: Care Management:   no Social Work:    no Prescription Assistance:  no Nutrition/Diabetes Education:  no   Plan:  Personalized Goals Goals Addressed            This Visit's Progress   . Exercise 3x per week (30 min per time)       Try to exercise for at  least 30 minutes, 3 times weekly      Personalized Health Maintenance & Screening Recommendations  Colorectal cancer screening  Lung Cancer Screening Recommended: no (Low Dose CT Chest recommended if Age 81-80 years, 30 pack-year currently smoking OR have quit w/in past 15 years) Hepatitis C Screening recommended: no HIV Screening recommended: no  Advanced Directives: Written information was prepared per patient's request.  Referrals & Orders No orders of the defined types were placed in this encounter.   Follow-up Plan . Follow-up with Sharion Balloon, FNP as planned   I have personally reviewed and noted the following in the patient's chart:   . Medical and social history . Use of alcohol, tobacco or illicit drugs  . Current medications and supplements . Functional ability and status . Nutritional status . Physical activity . Advanced directives . List of other physicians . Hospitalizations, surgeries, and ER visits in previous 12 months . Vitals . Screenings to include cognitive, depression, and falls . Referrals and appointments  In addition, I have reviewed and discussed with Alexandria Irwin certain preventive protocols, quality metrics, and best practice recommendations. A written personalized care plan for preventive services as well as general preventive health recommendations is available and can be mailed to the patient at her request.      Alexandria Heath, LPN      3/50/0938  I have reviewed and agree with the above AWV documentation.   Evelina Dun, FNP

## 2018-12-18 NOTE — Patient Instructions (Signed)
Alexandria Irwin , Thank you for taking time to come for your Medicare Wellness Visit. I appreciate your ongoing commitment to your health goals. Please review the following plan we discussed and let me know if I can assist you in the future.   These are the goals we discussed: Goals    . Exercise 3x per week (30 min per time)     Try to exercise for at least 30 minutes, 3 times weekly       This is a list of the screening recommended for you and due dates:  Health Maintenance  Topic Date Due  . Colon Cancer Screening  07/04/1993  . Stool Blood Test  01/22/2018  . Flu Shot  01/30/2019  . Eye exam for diabetics  05/20/2019  . Hemoglobin A1C  05/23/2019  . Complete foot exam   06/02/2019  . Tetanus Vaccine  12/11/2028  . DEXA scan (bone density measurement)  Completed  .  Hepatitis C: One time screening is recommended by Center for Disease Control  (CDC) for  adults born from [birth year] through 1967.   Completed  . Pneumonia vaccines  Completed     Advance Directive  Advance directives are legal documents that let you make choices ahead of time about your health care and medical treatment in case you become unable to communicate for yourself. Advance directives are a way for you to communicate your wishes to family, friends, and health care providers. This can help convey your decisions about end-of-life care if you become unable to communicate. Discussing and writing advance directives should happen over time rather than all at once. Advance directives can be changed depending on your situation and what you want, even after you have signed the advance directives. If you do not have an advance directive, some states assign family decision makers to act on your behalf based on how closely you are related to them. Each state has its own laws regarding advance directives. You may want to check with your health care provider, attorney, or state representative about the laws in your state. There are  different types of advance directives, such as:  Medical power of attorney.  Living will.  Do not resuscitate (DNR) or do not attempt resuscitation (DNAR) order. Health care proxy and medical power of attorney A health care proxy, also called a health care agent, is a person who is appointed to make medical decisions for you in cases in which you are unable to make the decisions yourself. Generally, people choose someone they know well and trust to represent their preferences. Make sure to ask this person for an agreement to act as your proxy. A proxy may have to exercise judgment in the event of a medical decision for which your wishes are not known. A medical power of attorney is a legal document that names your health care proxy. Depending on the laws in your state, after the document is written, it may also need to be:  Signed.  Notarized.  Dated.  Copied.  Witnessed.  Incorporated into your medical record. You may also want to appoint someone to manage your financial affairs in a situation in which you are unable to do so. This is called a durable power of attorney for finances. It is a separate legal document from the durable power of attorney for health care. You may choose the same person or someone different from your health care proxy to act as your agent in financial matters. If you do not  appoint a proxy, or if there is a concern that the proxy is not acting in your best interests, a court-appointed guardian may be designated to act on your behalf. Living will A living will is a set of instructions documenting your wishes about medical care when you cannot express them yourself. Health care providers should keep a copy of your living will in your medical record. You may want to give a copy to family members or friends. To alert caregivers in case of an emergency, you can place a card in your wallet to let them know that you have a living will and where they can find it. A living  will is used if you become:  Terminally ill.  Incapacitated.  Unable to communicate or make decisions. Items to consider in your living will include:  The use or non-use of life-sustaining equipment, such as dialysis machines and breathing machines (ventilators).  A DNR or DNAR order, which is the instruction not to use cardiopulmonary resuscitation (CPR) if breathing or heartbeat stops.  The use or non-use of tube feeding.  Withholding of food and fluids.  Comfort (palliative) care when the goal becomes comfort rather than a cure.  Organ and tissue donation. A living will does not give instructions for distributing your money and property if you should pass away. It is recommended that you seek the advice of a lawyer when writing a will. Decisions about taxes, beneficiaries, and asset distribution will be legally binding. This process can relieve your family and friends of any concerns surrounding disputes or questions that may come up about the distribution of your assets. DNR or DNAR A DNR or DNAR order is a request not to have CPR in the event that your heart stops beating or you stop breathing. If a DNR or DNAR order has not been made and shared, a health care provider will try to help any patient whose heart has stopped or who has stopped breathing. If you plan to have surgery, talk with your health care provider about how your DNR or DNAR order will be followed if problems occur. Summary  Advance directives are the legal documents that allow you to make choices ahead of time about your health care and medical treatment in case you become unable to communicate for yourself.  The process of discussing and writing advance directives should happen over time. You can change the advance directives, even after you have signed them.  Advance directives include DNR or DNAR orders, living wills, and designating an agent as your medical power of attorney. This information is not intended to  replace advice given to you by your health care provider. Make sure you discuss any questions you have with your health care provider. Document Released: 09/24/2007 Document Revised: 05/06/2016 Document Reviewed: 05/06/2016 Elsevier Interactive Patient Education  2019 [employer].

## 2018-12-22 ENCOUNTER — Ambulatory Visit: Payer: Medicare Other | Admitting: Family

## 2018-12-23 ENCOUNTER — Other Ambulatory Visit: Payer: Self-pay

## 2018-12-23 ENCOUNTER — Ambulatory Visit (INDEPENDENT_AMBULATORY_CARE_PROVIDER_SITE_OTHER): Payer: Medicare Other | Admitting: *Deleted

## 2018-12-23 DIAGNOSIS — E538 Deficiency of other specified B group vitamins: Secondary | ICD-10-CM | POA: Diagnosis not present

## 2018-12-24 ENCOUNTER — Other Ambulatory Visit: Payer: Self-pay | Admitting: Family

## 2018-12-30 ENCOUNTER — Encounter: Payer: Self-pay | Admitting: Gynecologic Oncology

## 2018-12-30 ENCOUNTER — Inpatient Hospital Stay: Payer: Medicare Other | Attending: Gynecologic Oncology | Admitting: Gynecologic Oncology

## 2018-12-30 ENCOUNTER — Other Ambulatory Visit: Payer: Self-pay

## 2018-12-30 VITALS — BP 146/83 | HR 63 | Temp 98.9°F | Resp 18 | Ht 61.0 in | Wt 173.2 lb

## 2018-12-30 DIAGNOSIS — I10 Essential (primary) hypertension: Secondary | ICD-10-CM | POA: Insufficient documentation

## 2018-12-30 DIAGNOSIS — E7800 Pure hypercholesterolemia, unspecified: Secondary | ICD-10-CM | POA: Insufficient documentation

## 2018-12-30 DIAGNOSIS — Z923 Personal history of irradiation: Secondary | ICD-10-CM

## 2018-12-30 DIAGNOSIS — Z90722 Acquired absence of ovaries, bilateral: Secondary | ICD-10-CM | POA: Diagnosis not present

## 2018-12-30 DIAGNOSIS — E119 Type 2 diabetes mellitus without complications: Secondary | ICD-10-CM | POA: Diagnosis not present

## 2018-12-30 DIAGNOSIS — E785 Hyperlipidemia, unspecified: Secondary | ICD-10-CM | POA: Diagnosis not present

## 2018-12-30 DIAGNOSIS — Z7984 Long term (current) use of oral hypoglycemic drugs: Secondary | ICD-10-CM | POA: Diagnosis not present

## 2018-12-30 DIAGNOSIS — E669 Obesity, unspecified: Secondary | ICD-10-CM | POA: Diagnosis not present

## 2018-12-30 DIAGNOSIS — Z9221 Personal history of antineoplastic chemotherapy: Secondary | ICD-10-CM

## 2018-12-30 DIAGNOSIS — C541 Malignant neoplasm of endometrium: Secondary | ICD-10-CM | POA: Diagnosis not present

## 2018-12-30 DIAGNOSIS — Z79899 Other long term (current) drug therapy: Secondary | ICD-10-CM | POA: Diagnosis not present

## 2018-12-30 DIAGNOSIS — Z90710 Acquired absence of both cervix and uterus: Secondary | ICD-10-CM

## 2018-12-30 DIAGNOSIS — K219 Gastro-esophageal reflux disease without esophagitis: Secondary | ICD-10-CM | POA: Insufficient documentation

## 2018-12-30 DIAGNOSIS — E039 Hypothyroidism, unspecified: Secondary | ICD-10-CM | POA: Diagnosis not present

## 2018-12-30 DIAGNOSIS — E538 Deficiency of other specified B group vitamins: Secondary | ICD-10-CM | POA: Insufficient documentation

## 2018-12-30 DIAGNOSIS — E559 Vitamin D deficiency, unspecified: Secondary | ICD-10-CM | POA: Diagnosis not present

## 2018-12-30 DIAGNOSIS — Z86718 Personal history of other venous thrombosis and embolism: Secondary | ICD-10-CM | POA: Insufficient documentation

## 2018-12-30 NOTE — Progress Notes (Signed)
Follow-up Note: Gyn-Onc  Consult was requested by Dr. Kaplan for the evaluation of Alexandria Irwin 73 y.o. female  CC:  Chief Complaint  Patient presents with  . endometrial cancer    follow-up    Assessment/Plan:  Alexandria Irwin  is a 73 y.o.  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 Genetic testing negative.  S/p adjuvant carboplatin and paclitaxel chemotherapy x 6 cycles with adjuvant vaginal brachytherapy for local control completed in June, 2019.  Complete clinical response.   I will see Alexandria Irwin back in 6 months. She will see Dr Kinard in 3 months.   HPI: Alexandria Irwin is a 71-year-old para 4 who was seen in consultation at the request of Dr. Kaplan for grade 1 endometrial cancer.  The patient has a history of postmenopausal bleeding since November 2017.  At this time she saw her primary care doctor who diagnosed a urinary tract infection and treated her intermittently with antibiotics for approximately 3 months.  However given the persistence of the symptoms she then sought evaluation with her gynecologist Dr. Kaplan in April 2018.  At this time a transvaginal ultrasound scan was performed which showed some fibroids in the uterus but no thickening of the endometrium.  Pap smear and pelvic examination were unremarkable and therefore it was felt that the source of bleeding was not gynecologic.  She was then sent to Dr. Mackenzie from [facility] who performed an evaluation including a CT scan of the abdomen and pelvis in September 2018.  This showed grossly normal urologic system and no evidence of extrauterine disease however the uterus was slightly 4 cm intrauterine mass concerning for neoplasm.  Dr. McKenzie then performed a cystoscopy which was also unremarkable.  She was referred back to Dr. Kaplan for further evaluation.  In October 2018 Dr. Kaplan repeated the pelvic ultrasound scan  which again confirmed what appeared to be a thin endometrium with fibroids.  However given the persistence of bleeding and the CT findings he took the patient to the operating room on June 13, 2017 and performed a hysteroscopy D&C and polypectomy.  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  The patient otherwise has medical history significant for a DVT in the right lower extremity approximately 12 years ago that was spontaneous.  She was treated with aspirin 325 mg daily but no anti-thrombotic agents.  She has had no recurrences since that time.  She has a history of type 2 diabetes mellitus with fairly poorly controlled with an HbA1c earlier in 2018 of 9.2%.  At that time she is taking only metformin.  She was then added Januvia.  Follow-up HbA1c in November 2018 had reduced to 7.2%.  The patient reports fasting blood glucose levels between 07/31/1950.  Her primary care doctor Dr. Hawks manages her blood glucose.  Patient also has hypertension hypercholesterolemia.  She denies a coronary artery disease history.  She has had 4 prior vaginal deliveries.  She has no family history concerning for Lynch syndrome.  She is obese with a height of 5 foot 2 inches and a weight of 174 pounds.  Her prior abdominal surgery includes a laparoscopic cholecystectomy.  She has an umbilical hernia. On 08/05/17 she underwent a robotic assisted total hysterectomy, BSO, sentinel lymph node biopsy.  Surgery also included an umbilical hernia repair.  The final pathology revealed a uterus with a FIGO grade 2 endometrioid adenocarcinoma involving the   full-thickness of the posterior myometrium with tumor present at the inked serosal surface.  There was also microscopic involvement of the left fallopian tube.  Sentinel lymph nodes bilaterally were negative as was the cervix.  Lymphovascular space invasion was not identified.  The patient was staged as having stage III a grade 2 endometrioid adenocarcinoma  and was recommended to proceed with adjuvant therapy with carboplatin paclitaxel chemotherapy and vaginal brachytherapy.  She demonstrated loss of mL H1 nuclear expression on her pathology and therefore genetics consultation was recommended, but no genetic mutations were identified on comprehensive testing in March, 2019.  She went on to complete 6 cycles of carb/taxol chemotherapy (completed June, 2019) and 30Gy (in 5 fractions) of vaginal brachytherapy. She tolerated therapy well.  Post treatment imaging with CT abd/pelvis on 01/13/18 showed bilateral lymphocysts measuring up to 5cm. These had slightly increased from postop imaging. Her bladder wall was also thickened. No definitive recurrent/persistent tumor. Follow-up CT imaging in October, 2019 showed reduction in size of lymphocysts.   The patient reports a history of a right DVT "years ago" and chronic right LE edema since that time. She has mild intermittent left foot and ankle swelling. It has been stable.  Interval Hx:  We discussed survivorship issues including sexual function, surveillance schedule, symptom review.   Current Meds:  Outpatient Encounter Medications as of 12/30/2018  Medication Sig  . acetaminophen (TYLENOL) 500 MG tablet Take 1,000 mg by mouth every 8 (eight) hours as needed for mild pain or headache.  Marland Kitchen aspirin EC 81 MG tablet Take 81 mg by mouth daily.  . Calcium Citrate-Vitamin D ([employer] CALCIUM CITRATE +D PO) Take 2 tablets by mouth every evening.   . Cholecalciferol (D 2000) 2000 units TABS Take 2,000 Units by mouth daily.  . cyanocobalamin (,VITAMIN B-12,) 1000 MCG/ML injection Inject 1,000 mcg into the muscle every 30 (thirty) days.   . cycloSPORINE (RESTASIS) 0.05 % ophthalmic emulsion Place 1 drop into both eyes 2 (two) times daily.   . EUTHYROX 100 MCG tablet Take 1 tablet by mouth once daily  . furosemide (LASIX) 20 MG tablet Take 1 tablet (20 mg total) by mouth daily.  Marland Kitchen glucose blood (ONETOUCH VERIO) test  strip USE TO CHECK GLUCOSE DAILY  . JANUVIA 50 MG tablet TAKE 1 TABLET BY MOUTH ONCE DAILY (NEEDS  TO  BE  SEEN  BEFORE  NEXT  REFILL)  . Lancets (ONETOUCH DELICA PLUS [id number]) MISC USE 1  TO CHECK GLUCOSE ONCE DAILY  . losartan (COZAAR) 100 MG tablet Take 1 tablet by mouth once daily  . Magnesium 250 MG TABS Take 250 mg by mouth every evening.   . meclizine (ANTIVERT) 25 MG tablet Take 1 tablet (25 mg total) by mouth 2 (two) times daily as needed for dizziness.  . metFORMIN (GLUCOPHAGE) 500 MG tablet TAKE 1 TABLET BY MOUTH TWICE DAILY WITH A MEAL  . metoprolol succinate (TOPROL-XL) 50 MG 24 hr tablet TAKE 1 TABLET BY MOUTH TWICE DAILY WITH MEALS  . Omega-3 Fatty Acids (FISH OIL) 1000 MG CAPS Take 1,000 mg by mouth every evening.  . simvastatin (ZOCOR) 40 MG tablet TAKE 1 TABLET BY MOUTH ONCE DAILY AT 6PM  . vitamin C (ASCORBIC ACID) 500 MG tablet Take 500 mg by mouth daily.  . [DISCONTINUED] metoprolol (LOPRESSOR) 50 MG tablet TAKE 1 TABLET (50 MG TOTAL) BY MOUTH 2 (TWO) TIMES DAILY.   Facility-Administered Encounter Medications as of 12/30/2018  Medication  . cyanocobalamin ((VITAMIN B-12)) injection 1,000 mcg  Allergy:  Allergies  Allergen Reactions  . Ace Inhibitors Cough  . Ciprocin-Fluocin-Procin [Fluocinolone] Hives  . Norvasc [Amlodipine] Cough  . Latex Rash  . Neosporin [Neomycin-Bacitracin Zn-Polymyx] Rash  . Sulfa Antibiotics Rash    Social Hx:   Social History   Socioeconomic History  . Marital status: Married    Spouse name: Johnny  . Number of children: 4  . Years of education: Not on file  . Highest education level: Some college, no degree  Occupational History  . Occupation: retired  Social Needs  . Financial resource strain: Not hard at all  . Food insecurity    Worry: Never true    Inability: Never true  . Transportation needs    Medical: No    Non-medical: No  Tobacco Use  . Smoking status: Never Smoker  . Smokeless tobacco: Never Used   Substance and Sexual Activity  . Alcohol use: No  . Drug use: No  . Sexual activity: Yes    Birth control/protection: Post-menopausal  Lifestyle  . Physical activity    Days per week: 0 days    Minutes per session: 0 min  . Stress: Not at all  Relationships  . Social connections    Talks on phone: More than three times a week    Gets together: More than three times a week    Attends religious service: Never    Active member of club or organization: No    Attends meetings of clubs or organizations: Never    Relationship status: Married  . Intimate partner violence    Fear of current or ex partner: No    Emotionally abused: No    Physically abused: No    Forced sexual activity: Not on file  Other Topics Concern  . Not on file  Social History Narrative  . Not on file    Past Surgical Hx:  Past Surgical History:  Procedure Laterality Date  . BREAST BIOPSY Left 2009   benign  . CATARACT EXTRACTION W/ INTRAOCULAR LENS IMPLANT Left 2018  . CATARACT EXTRACTION W/PHACO Right 03/14/2014   Procedure: CATARACT EXTRACTION PHACO AND INTRAOCULAR LENS PLACEMENT (IOC);  Surgeon: Kerry Hunt, MD;  Location: [facility];  Service: Ophthalmology;  Laterality: Right;  CDE: 8.34  . FOOT GANGLION EXCISION Right 2013  . HYSTEROSCOPY W/D&C N/A 06/13/2017   Procedure: DILATATION AND CURETTAGE /HYSTEROSCOPY;  Surgeon: Kaplan, Richard, MD;  Location: [facility];  Service: Gynecology;  Laterality: N/A;  . IR FLUORO GUIDE PORT INSERTION RIGHT  08/27/2017  . IR REMOVAL TUN ACCESS W/ PORT W/O FL MOD SED  04/27/2018  . IR US GUIDE VASC ACCESS RIGHT  08/27/2017  . LAPAROSCOPIC CHOLECYSTECTOMY  2005  . ROBOTIC ASSISTED TOTAL HYSTERECTOMY WITH BILATERAL SALPINGO OOPHERECTOMY Bilateral 08/05/2017   Procedure: ROBOTIC ASSISTED TOTAL HYSTERECTOMY WITH BILATERAL SALPINGO OOPHORECTOMY;  Surgeon: Jules Baty, MD;  Location: [facility];  Service: Gynecology;  Laterality: Bilateral;  . SENTINEL NODE BIOPSY Bilateral 08/05/2017    Procedure: SENTINEL NODE BIOPSY;  Surgeon: Pablo Mathurin, MD;  Location: [facility];  Service: Gynecology;  Laterality: Bilateral;  . UMBILICAL HERNIA REPAIR N/A 08/05/2017   Procedure: HERNIA REPAIR UMBILICAL ADULT;  Surgeon: Ayushi Pla, MD;  Location: [facility];  Service: Gynecology;  Laterality: N/A;  . VEIN SURGERY Right 2011   vein stripping-leg  and laser treatment    Past Medical Hx:  Past Medical History:  Diagnosis Date  . Cataract   . Endometrial adenocarcinoma (HCC)   . Environmental and seasonal   allergies   . Family history of ovarian cancer   . Family history of pancreatic cancer   . Genetic testing 09/19/2017   Multi-Cancer panel (83 genes) @ [facility] - No pathogenic mutations detected  . GERD (gastroesophageal reflux disease)   . History of DVT of lower extremity    early 2000s--- lower right leg  . Hyperlipidemia   . Hypertension   . Hypothyroidism   . Type 2 diabetes mellitus ([place])    followed by pcp  . Umbilical hernia   . Vitamin B 12 deficiency   . Vitamin D deficiency   . Wears glasses     Past Gynecological History:  SVD x 4 No LMP recorded. Patient has had a hysterectomy.  Family Hx:  Family History  Problem Relation Age of Onset  . Pancreatic cancer Mother 22       deceased 89  . Ovarian cancer Mother        dx 24s  . Cancer Other 14       maternal half-sister; unk. type    Review of Systems:  Constitutional  Feels well,    ENT Normal appearing ears and nares bilaterally Skin/Breast  No rash, sores, jaundice, itching, dryness Cardiovascular  No chest pain, shortness of breath, or edema  Pulmonary  No cough or wheeze.  Gastro Intestinal  No nausea, vomitting, or diarrhoea. No bright red blood per rectum, no abdominal pain, change in bowel movement, or constipation.  Genito Urinary  No frequency, urgency, dysuria,no bleeding Musculo Skeletal  No myalgia, arthralgia, joint swelling or pain  Neurologic  No weakness, numbness, change in gait,   Psychology  No depression, anxiety, insomnia.   Vitals:  Blood pressure (!) 146/83, pulse 63, temperature 98.9 F (37.2 C), temperature source Oral, resp. rate 18, height 5' 1" (1.549 m), weight 173 lb 3.2 oz (78.6 kg), SpO2 96 %.  Physical Exam: WD in NAD Neck  Supple NROM, without any enlargements.  Lymph Node Survey No cervical supraclavicular or inguinal adenopathy Cardiovascular  Pulse normal rate, regularity and rhythm. S1 and S2 normal.  Lungs  Clear to auscultation bilateraly, without wheezes/crackles/rhonchi. Good air movement.  Skin  No rash/lesions/breakdown  Psychiatry  Alert and oriented to person, place, and time  Abdomen  Normoactive bowel sounds, abdomen soft, non-tender and obese without hernia. Well healed incisions.  Back No CVA tenderness Genito Urinary  Vulva/vagina: vaginal cuff healed, no mucosal changes. No palpable masses. Rectal  deferred Extremities  No bilateral cyanosis, clubbing or edema.     Thereasa Solo, MD  12/30/2018, 2:22 PM

## 2018-12-30 NOTE — Patient Instructions (Signed)
Please notify Dr Denman George at phone number [phone] if you notice vaginal bleeding, new pelvic or abdominal pains, bloating, feeling full easy, or a change in bladder or bowel function.   Please contact Dr Serita Grit office (at [phone]) in October, 2020 (or ask Dr Clabe Seal staff t do this for you) to request an appointment with her for January, 2021.

## 2019-01-06 ENCOUNTER — Other Ambulatory Visit: Payer: Self-pay | Admitting: Family

## 2019-01-06 ENCOUNTER — Telehealth: Payer: Medicare Other

## 2019-01-06 NOTE — Telephone Encounter (Signed)
Last office visit in may with an A1C lab result.

## 2019-01-16 IMAGING — CT CT CHEST W/ CM
2 of 5 series · 13 of 36 positions shown, 16 images · IV contrast (ISOVUE 300)
Comparison: 03/13/2017 abdominopelvic CT. Chest radiograph
09/27/2014.

CLINICAL DATA: Staging of endometrial cancer.

EXAM:
CT CHEST, ABDOMEN, AND PELVIS WITH CONTRAST
TECHNIQUE: Multidetector CT imaging of the chest, abdomen and pelvis was
performed following the standard protocol during bolus
administration of intravenous contrast.
CONTRAST:  100mL FQ6L21-V00 IOPAMIDOL (FQ6L21-V00) INJECTION 61%

[Series 2: cap with · axial · 0.82mm/px · z∈[-393,+102]mm · 10 of 123 slices shown, 13 images]
[im 12/123  mediastinal]
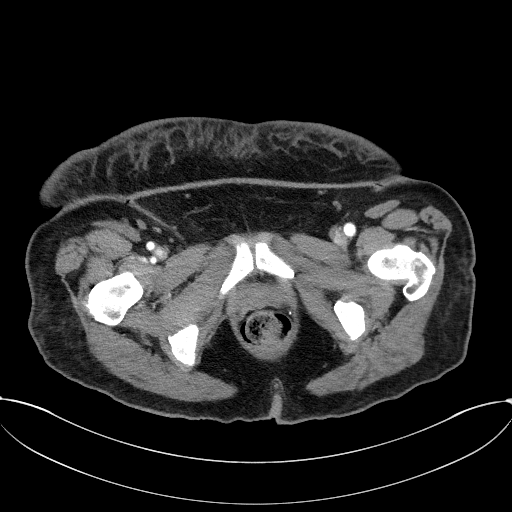
[im 12/123  lung]
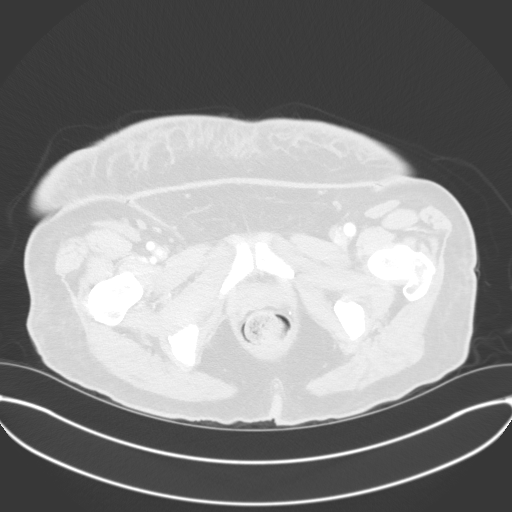
[im 23/123  lung]
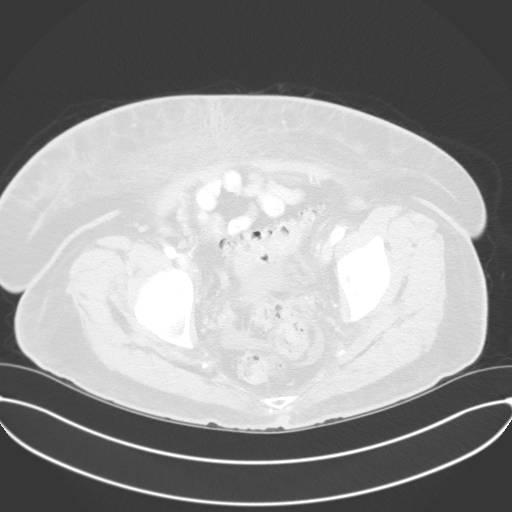
[im 34/123  lung]
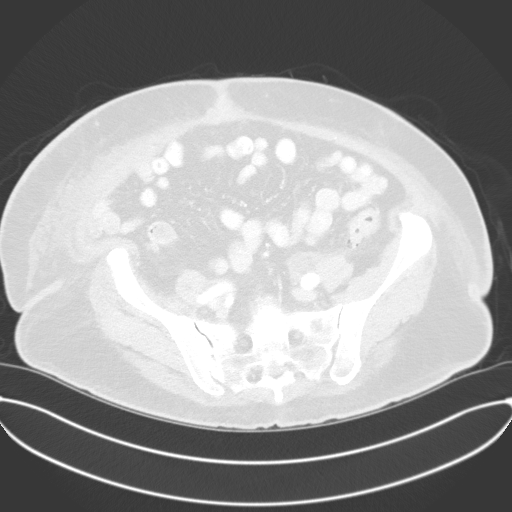
[im 45/123  lung]
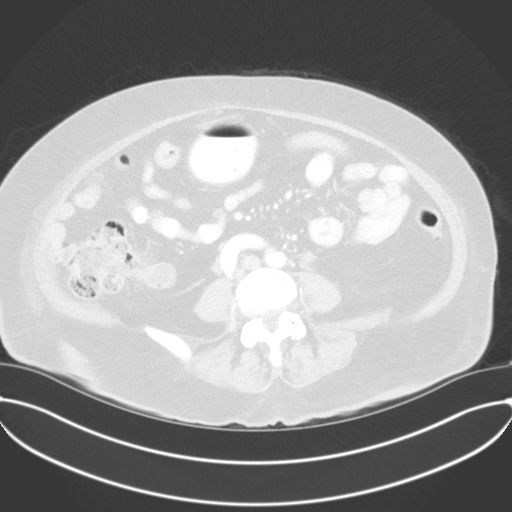
[im 56/123  mediastinal]
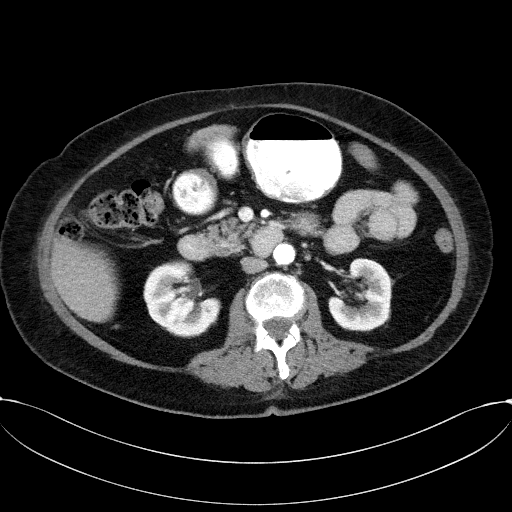
[im 56/123  lung]
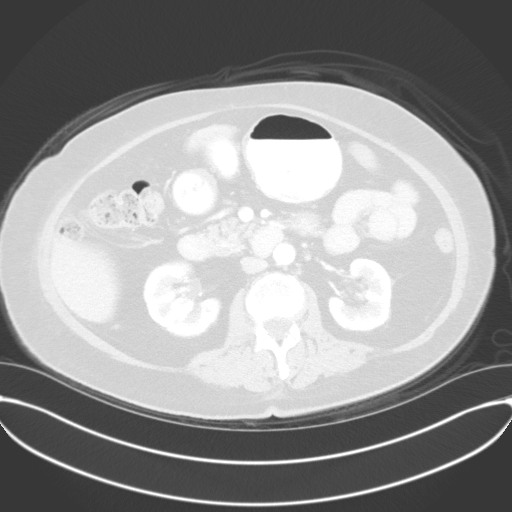
[im 67/123  lung]
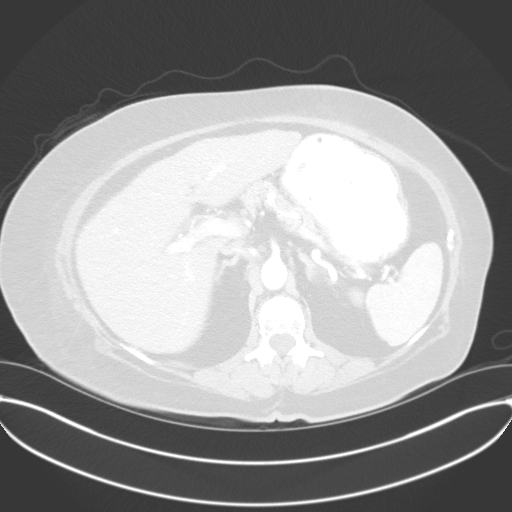
[im 78/123  lung]
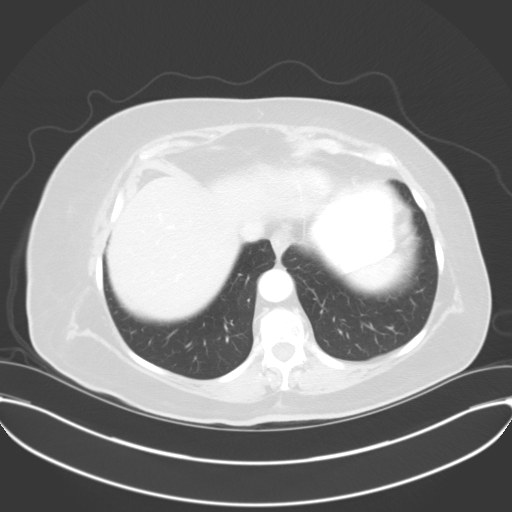
[im 89/123  lung]
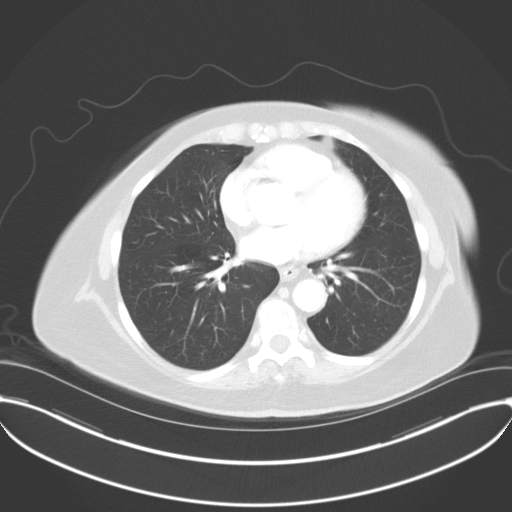
[im 100/123  mediastinal]
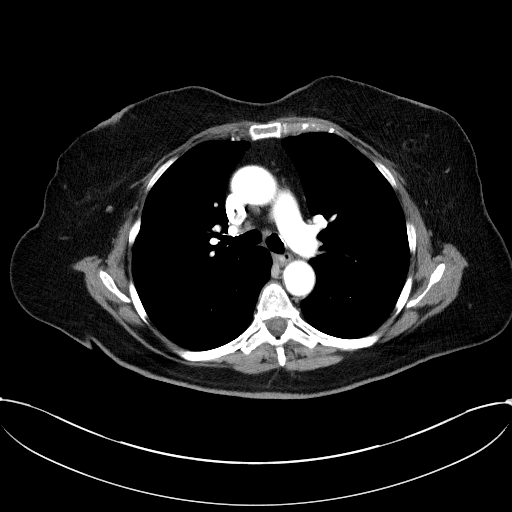
[im 100/123  lung]
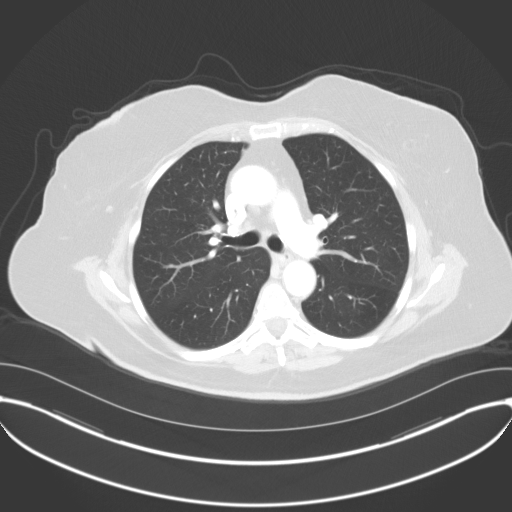
[im 111/123  lung]
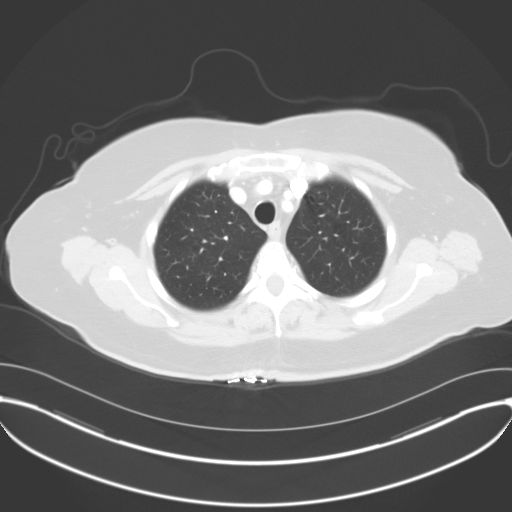

[Series 4: coronals · coronal · 0.89mm/px · 3 of 133 slices shown]
[im 27/133  lung]
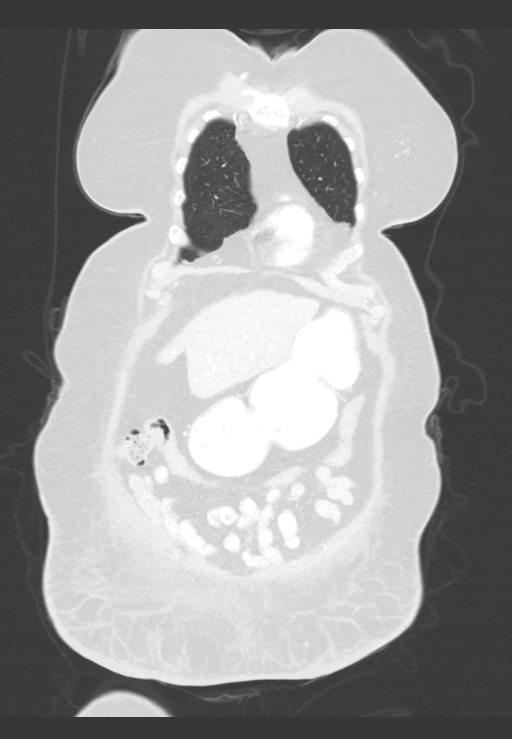
[im 53/133  lung]
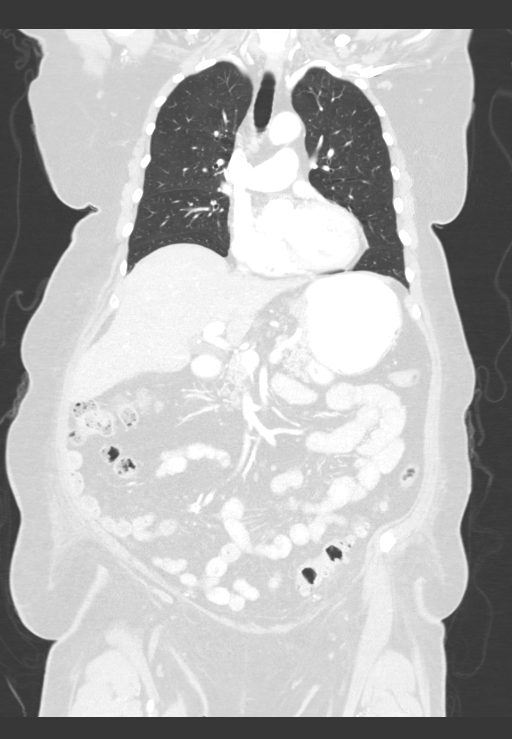
[im 80/133  lung]
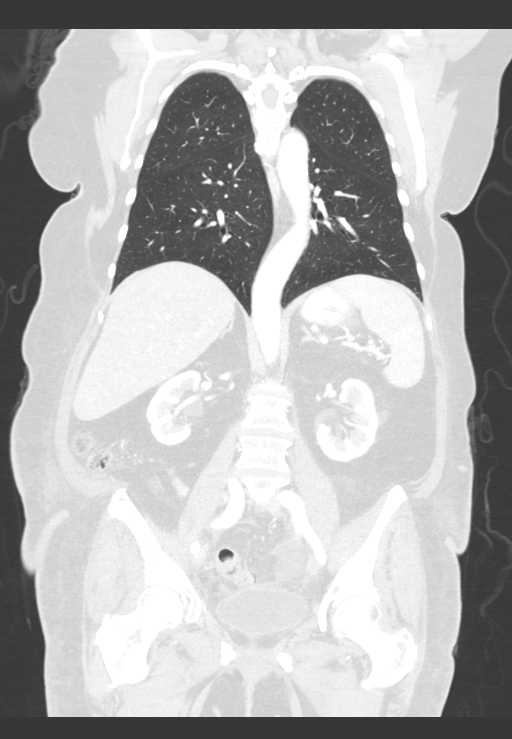

[13 of 36 positions shown; findings below may reference images not displayed]

FINDINGS: CT CHEST FINDINGS

Cardiovascular: Aortic and branch vessel atherosclerosis. Tortuous
thoracic aorta. Normal heart size, without pericardial effusion.
Right coronary artery atherosclerosis. No central pulmonary
embolism, on this non-dedicated study.

Mediastinum/Nodes: No supraclavicular adenopathy. No mediastinal or
hilar adenopathy.

Lungs/Pleura: No pleural fluid.  Clear lungs.

Musculoskeletal: No acute osseous abnormality.

CT ABDOMEN PELVIS FINDINGS

Hepatobiliary: Subtle irregular hepatic capsule, including on image
57/series 2. No focal liver lesion. Cholecystectomy, without biliary
ductal dilatation.

Pancreas: Normal, without mass or ductal dilatation.

Spleen: Normal in size, without focal abnormality.

Adrenals/Urinary Tract: Normal kidneys, without hydronephrosis.
Normal urinary bladder.

Stomach/[place] stomach, without wall thickening. Normal colon
and terminal ileum. Normal small bowel.

Vascular/Lymphatic: Aortic and branch vessel atherosclerosis. No
abdominopelvic adenopathy.

Reproductive: Hysterectomy. Fluid in the left hemipelvis, likely
adnexal, measures 4.4 x 3.8 cm on image 95/series 2.

Other: Minimal free fluid in the pelvis. Anterior pelvic wall edema
is likely postoperative. No well-defined fluid collection.

Musculoskeletal: Degenerate disc disease at L4-5 and L5-S1.
IMPRESSION: 1. Interval hysterectomy. No findings of recurrent or metastatic
disease.
2. Small volume pelvic fluid and a presumed postoperative seroma or
lymphangioma within the left hemipelvis.
3. Subtle irregular hepatic capsule is suspicious for mild
cirrhosis. Correlate with risk factors.
4. Coronary artery atherosclerosis. Aortic Atherosclerosis
([id number]).

## 2019-01-21 ENCOUNTER — Other Ambulatory Visit: Payer: Self-pay

## 2019-01-22 ENCOUNTER — Ambulatory Visit (INDEPENDENT_AMBULATORY_CARE_PROVIDER_SITE_OTHER): Payer: Medicare Other | Admitting: *Deleted

## 2019-01-22 DIAGNOSIS — E538 Deficiency of other specified B group vitamins: Secondary | ICD-10-CM

## 2019-01-22 NOTE — Progress Notes (Signed)
Pt given Cyanocobalamin inj Tolerated well 

## 2019-01-28 DIAGNOSIS — E1142 Type 2 diabetes mellitus with diabetic polyneuropathy: Secondary | ICD-10-CM | POA: Diagnosis not present

## 2019-01-28 DIAGNOSIS — L84 Corns and callosities: Secondary | ICD-10-CM | POA: Diagnosis not present

## 2019-01-28 DIAGNOSIS — M79676 Pain in unspecified toe(s): Secondary | ICD-10-CM | POA: Diagnosis not present

## 2019-01-28 DIAGNOSIS — B351 Tinea unguium: Secondary | ICD-10-CM | POA: Diagnosis not present

## 2019-01-29 IMAGING — US IR US GUIDE VASC ACCESS RIGHT
1 series · 2 of 2 positions shown · non-contrast
Comparison: Chest CT - 08/14/2017

INDICATION: History of metastatic endometrial cancer in need of durable
intravenous access for chemotherapy administration.

EXAM:
IMPLANTED PORT A CATH PLACEMENT WITH ULTRASOUND AND FLUOROSCOPIC
GUIDANCE

[Series 1: ir fluoro/shunt/fist · 2 of 2 slices shown]
[im 1/2]
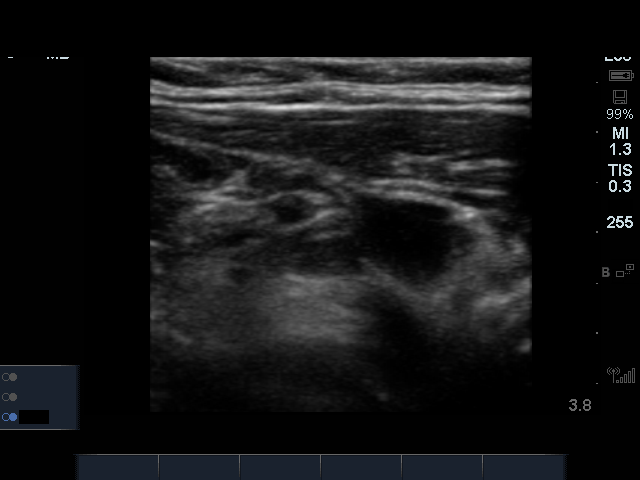
[im 2/2]
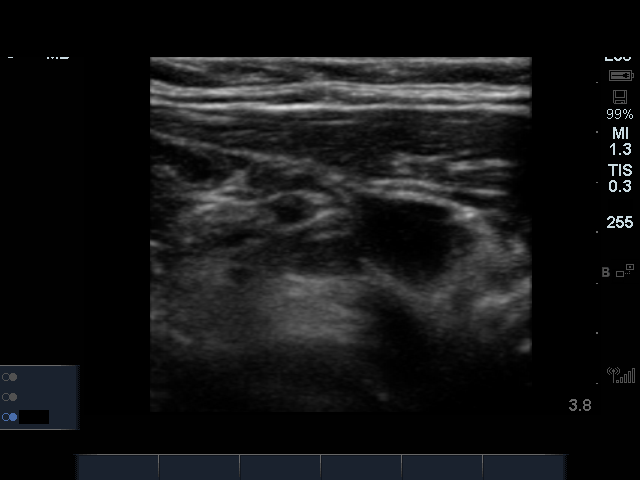

[2 of 2 positions shown; findings below may reference images not displayed]

MEDICATIONS:
Ancef 2 gm IV; The antibiotic was administered within an appropriate
time interval prior to skin puncture.

ANESTHESIA/SEDATION:
Moderate (conscious) sedation was employed during this procedure. A
total of Versed 1 mg and Fentanyl 50 mcg was administered
intravenously.

Moderate Sedation Time: 36 minutes. The patient's level of
consciousness and vital signs were monitored continuously by
radiology nursing throughout the procedure under my direct
supervision.

CONTRAST:  None

FLUOROSCOPY TIME:  2 minutes, 36 seconds (3 mGy)

COMPLICATIONS:
None immediate.

PROCEDURE:
The procedure, risks, benefits, and alternatives were explained to
the patient. Questions regarding the procedure were encouraged and
answered. The patient understands and consents to the procedure.

The right neck and chest were prepped with chlorhexidine in a
sterile fashion, and a sterile drape was applied covering the
operative field. Maximum barrier sterile technique with sterile
gowns and gloves were used for the procedure. A timeout was
performed prior to the initiation of the procedure. Local anesthesia
was provided with 1% lidocaine with epinephrine.

After creating a small venotomy incision, a micropuncture kit was
utilized to access the internal jugular vein. Real-time ultrasound
guidance was utilized for vascular access including the acquisition
of a permanent ultrasound image documenting patency of the accessed
vessel. The microwire was utilized to measure appropriate catheter
length.

A subcutaneous port pocket was then created along the upper chest
wall utilizing a combination of sharp and blunt dissection. The
pocket was irrigated with sterile saline. A single lumen ISP power
injectable port was chosen for placement. The 8 Fr catheter was
tunneled from the port pocket site to the venotomy incision. The
port was placed in the pocket. The external catheter was trimmed to
appropriate length. At the venotomy, an 8 Fr peel-away sheath was
placed over a guidewire under fluoroscopic guidance. The catheter
was then placed through the sheath and the sheath was removed. Final
catheter positioning was confirmed and documented with a
fluoroscopic spot radiograph. The port was accessed with Jhemboy Padam
needle, aspirated and flushed with heparinized saline.

The venotomy site was closed with an interrupted 4-0 Vicryl suture.
The port pocket incision was closed with interrupted 2-0 Vicryl
suture and the skin was opposed with a running subcuticular 4-0
Vicryl suture. Dermabond and Hi Cham were applied to both
incisions. Dressings were placed. The patient tolerated the
procedure well without immediate post procedural complication.
FINDINGS: After catheter placement, the tip lies within the superior
cavoatrial junction. The catheter aspirates and flushes normally and
is ready for immediate use.
IMPRESSION: Successful placement of a right internal jugular approach power
injectable Port-A-Cath. The catheter is ready for immediate use.

## 2019-02-02 ENCOUNTER — Other Ambulatory Visit: Payer: Self-pay | Admitting: Family

## 2019-02-03 ENCOUNTER — Other Ambulatory Visit: Payer: Self-pay | Admitting: *Deleted

## 2019-02-03 MED ORDER — SITAGLIPTIN PHOSPHATE 50 MG PO TABS: 50.0000 mg | ORAL_TABLET | Freq: Every day | ORAL | 0 refills | Status: DC

## 2019-02-22 ENCOUNTER — Other Ambulatory Visit: Payer: Self-pay | Admitting: Family

## 2019-02-22 ENCOUNTER — Other Ambulatory Visit: Payer: Self-pay

## 2019-02-23 ENCOUNTER — Ambulatory Visit (INDEPENDENT_AMBULATORY_CARE_PROVIDER_SITE_OTHER): Payer: Medicare Other | Admitting: Family

## 2019-02-23 ENCOUNTER — Encounter: Payer: Self-pay | Admitting: Family

## 2019-02-23 ENCOUNTER — Other Ambulatory Visit: Payer: Self-pay

## 2019-02-23 VITALS — BP 130/78 | HR 60 | Temp 96.8°F | Ht 61.0 in | Wt 169.0 lb

## 2019-02-23 DIAGNOSIS — I10 Essential (primary) hypertension: Secondary | ICD-10-CM | POA: Diagnosis not present

## 2019-02-23 DIAGNOSIS — E538 Deficiency of other specified B group vitamins: Secondary | ICD-10-CM

## 2019-02-23 DIAGNOSIS — E785 Hyperlipidemia, unspecified: Secondary | ICD-10-CM

## 2019-02-23 DIAGNOSIS — E039 Hypothyroidism, unspecified: Secondary | ICD-10-CM | POA: Diagnosis not present

## 2019-02-23 DIAGNOSIS — E559 Vitamin D deficiency, unspecified: Secondary | ICD-10-CM | POA: Diagnosis not present

## 2019-02-23 DIAGNOSIS — E669 Obesity, unspecified: Secondary | ICD-10-CM

## 2019-02-23 DIAGNOSIS — C541 Malignant neoplasm of endometrium: Secondary | ICD-10-CM | POA: Diagnosis not present

## 2019-02-23 DIAGNOSIS — E1169 Type 2 diabetes mellitus with other specified complication: Secondary | ICD-10-CM

## 2019-02-23 DIAGNOSIS — I152 Hypertension secondary to endocrine disorders: Secondary | ICD-10-CM

## 2019-02-23 DIAGNOSIS — E1159 Type 2 diabetes mellitus with other circulatory complications: Secondary | ICD-10-CM

## 2019-02-23 LAB — BAYER DCA HB A1C WAIVED: HB A1C (BAYER DCA - WAIVED): 6.8 % (ref <7.0)

## 2019-02-23 NOTE — Patient Instructions (Signed)
Health Maintenance After Age 75 After age 75, you are at a higher risk for certain long-term diseases and infections as well as injuries from falls. Falls are a major cause of broken bones and head injuries in people who are older than age 75. Getting regular preventive care can help to keep you healthy and well. Preventive care includes getting regular testing and making lifestyle changes as recommended by your health care provider. Talk with your health care provider about:  Which screenings and tests you should have. A screening is a test that checks for a disease when you have no symptoms.  A diet and exercise plan that is right for you. What should I know about screenings and tests to prevent falls? Screening and testing are the best ways to find a health problem early. Early diagnosis and treatment give you the best chance of managing medical conditions that are common after age 75. Certain conditions and lifestyle choices may make you more likely to have a fall. Your health care provider may recommend:  Regular vision checks. Poor vision and conditions such as cataracts can make you more likely to have a fall. If you wear glasses, make sure to get your prescription updated if your vision changes.  Medicine review. Work with your health care provider to regularly review all of the medicines you are taking, including over-the-counter medicines. Ask your health care provider about any side effects that may make you more likely to have a fall. Tell your health care provider if any medicines that you take make you feel dizzy or sleepy.  Osteoporosis screening. Osteoporosis is a condition that causes the bones to get weaker. This can make the bones weak and cause them to break more easily.  Blood pressure screening. Blood pressure changes and medicines to control blood pressure can make you feel dizzy.  Strength and balance checks. Your health care provider may recommend certain tests to check your  strength and balance while standing, walking, or changing positions.  Foot health exam. Foot pain and numbness, as well as not wearing proper footwear, can make you more likely to have a fall.  Depression screening. You may be more likely to have a fall if you have a fear of falling, feel emotionally low, or feel unable to do activities that you used to do.  Alcohol use screening. Using too much alcohol can affect your balance and may make you more likely to have a fall. What actions can I take to lower my risk of falls? General instructions  Talk with your health care provider about your risks for falling. Tell your health care provider if: ? You fall. Be sure to tell your health care provider about all falls, even ones that seem minor. ? You feel dizzy, sleepy, or off-balance.  Take over-the-counter and prescription medicines only as told by your health care provider. These include any supplements.  Eat a healthy diet and maintain a healthy weight. A healthy diet includes low-fat dairy products, low-fat (lean) meats, and fiber from whole grains, beans, and lots of fruits and vegetables. Home safety  Remove any tripping hazards, such as rugs, cords, and clutter.  Install safety equipment such as grab bars in bathrooms and safety rails on stairs.  Keep rooms and walkways well-lit. Activity   Follow a regular exercise program to stay fit. This will help you maintain your balance. Ask your health care provider what types of exercise are appropriate for you.  If you need a cane or   walker, use it as recommended by your health care provider.  Wear supportive shoes that have nonskid soles. Lifestyle  Do not drink alcohol if your health care provider tells you not to drink.  If you drink alcohol, limit how much you have: ? 0-1 drink a day for women. ? 0-2 drinks a day for men.  Be aware of how much alcohol is in your drink. In the [place] one drink equals one typical bottle of beer (12  oz), one-half glass of wine (5 oz), or one shot of hard liquor (1 oz).  Do not use any products that contain nicotine or tobacco, such as cigarettes and e-cigarettes. If you need help quitting, ask your health care provider. Summary  Having a healthy lifestyle and getting preventive care can help to protect your health and wellness after age 75.  Screening and testing are the best way to find a health problem early and help you avoid having a fall. Early diagnosis and treatment give you the best chance for managing medical conditions that are more common for people who are older than age 75.  Falls are a major cause of broken bones and head injuries in people who are older than age 75. Take precautions to prevent a fall at home.  Work with your health care provider to learn what changes you can make to improve your health and wellness and to prevent falls. This information is not intended to replace advice given to you by your health care provider. Make sure you discuss any questions you have with your health care provider. Document Released: 04/30/2017 Document Revised: 10/08/2018 Document Reviewed: 04/30/2017 Elsevier Patient Education  2020 [employer]  

## 2019-02-23 NOTE — Progress Notes (Signed)
Subjective:    Patient ID: Alexandria Irwin, female    DOB: 04-30-1946, 73 y.o.   MRN: [id number]  Chief Complaint  Patient presents with  . Medical Management of Chronic Issues   Pt presents to the office today for chronic follow up. 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 She has completed chemo and radiation.She is followed by GI for cirrhosis.   Diabetes She presents for her follow-up diabetic visit. She has type 2 diabetes mellitus. Her disease course has been stable. There are no hypoglycemic associated symptoms. Pertinent negatives for hypoglycemia include no confusion or hunger. Associated symptoms include fatigue and foot paresthesias. Pertinent negatives for diabetes include no blurred vision and no visual change. Symptoms are stable. Diabetic complications include peripheral neuropathy. Pertinent negatives for diabetic complications include no CVA, heart disease or nephropathy. Risk factors for coronary artery disease include dyslipidemia, diabetes mellitus, hypertension, sedentary lifestyle and post-menopausal. She is following a generally healthy diet. Her overall blood glucose range is 140-180 mg/dl. Eye exam is current.  Hyperlipidemia This is a chronic problem. The current episode started more than 1 year ago. The problem is controlled. Exacerbating diseases include obesity. Pertinent negatives include no shortness of breath. Current antihyperlipidemic treatment includes statins. The current treatment provides moderate improvement of lipids. Risk factors for coronary artery disease include dyslipidemia, diabetes mellitus, hypertension, a sedentary lifestyle and post-menopausal.  Thyroid Problem Presents for follow-up visit. Symptoms include dry skin, fatigue and hoarse voice. Patient reports no depressed mood or visual change. The symptoms have been stable. Her past medical history is significant for hyperlipidemia.  Hypertension This is a chronic  problem. The current episode started more than 1 year ago. The problem has been resolved since onset. The problem is controlled. Associated symptoms include malaise/fatigue. Pertinent negatives include no blurred vision, peripheral edema or shortness of breath. Risk factors for coronary artery disease include dyslipidemia, diabetes mellitus, obesity and sedentary lifestyle. The current treatment provides moderate improvement. There is no history of CAD/MI or CVA. Identifiable causes of hypertension include a thyroid problem.  Anemia Presents for follow-up visit. Symptoms include bruises/bleeds easily and malaise/fatigue. There has been no confusion. Side effects of medications include fatigue.      Review of Systems  Constitutional: Positive for fatigue and malaise/fatigue.  HENT: Positive for hoarse voice.   Eyes: Negative for blurred vision.  Respiratory: Negative for shortness of breath.   Hematological: Bruises/bleeds easily.  Psychiatric/Behavioral: Negative for confusion.  All other systems reviewed and are negative.      Objective:   Physical Exam Vitals signs reviewed.  Constitutional:      General: She is not in acute distress.    Appearance: She is well-developed. She is obese.  HENT:     Head: Normocephalic and atraumatic.     Right Ear: Tympanic membrane normal.     Left Ear: Tympanic membrane normal.  Eyes:     Pupils: Pupils are equal, round, and reactive to light.  Neck:     Musculoskeletal: Normal range of motion and neck supple.     Thyroid: No thyromegaly.  Cardiovascular:     Rate and Rhythm: Normal rate and regular rhythm.     Heart sounds: Normal heart sounds. No murmur.  Pulmonary:     Effort: Pulmonary effort is normal. No respiratory distress.     Breath sounds: Normal breath sounds. No wheezing.  Abdominal:     General: Bowel sounds are normal. There is no distension.  Palpations: Abdomen is soft.     Tenderness: There is no abdominal tenderness.   Musculoskeletal: Normal range of motion.        General: No tenderness.     Right lower leg: Edema (trace) present.     Left lower leg: Edema (trace) present.  Skin:    General: Skin is warm and dry.  Neurological:     Mental Status: She is alert and oriented to person, place, and time.     Cranial Nerves: No cranial nerve deficit.     Deep Tendon Reflexes: Reflexes are normal and symmetric.  Psychiatric:        Behavior: Behavior normal.        Thought Content: Thought content normal.        Judgment: Judgment normal.      BP 130/78   Pulse 60   Temp (!) 96.8 F (36 C) (Temporal)   Ht 5' 1" (1.549 m)   Wt 169 lb (76.7 kg)   BMI 31.93 kg/m      Assessment & Plan:  Alexandria Irwin comes in today with chief complaint of Medical Management of Chronic Issues   Diagnosis and orders addressed:  1. Hypertension associated with diabetes ([place]) - CMP14+EGFR  2. Type 2 diabetes mellitus with other specified complication, without long-term current use of insulin ([facility]) - Bayer DCA Hb A1c Waived - CMP14+EGFR - Microalbumin / creatinine urine ratio  3. Hyperlipidemia associated with type 2 diabetes mellitus ([facility]CC) - CMP14+EGFR - Lipid panel  4. Hypothyroidism, unspecified type - CMP14+EGFR - TSH  5. Endometrial adenocarcinoma ([place]) - CMP14+EGFR  6. B12 deficiency - CMP14+EGFR - Anemia Profile B  7. Obesity (BMI 30-39.9) - CMP14+EGFR  8. Vitamin D deficiency - CMP14+EGFR - VITAMIN D 25 Hydroxy (Vit-D Deficiency, Fractures)   Labs pending Health Maintenance reviewed Diet and exercise encouraged  Follow up plan: 4 months    Evelina Dun, FNP

## 2019-02-24 ENCOUNTER — Other Ambulatory Visit: Payer: Self-pay | Admitting: Family

## 2019-02-24 LAB — LIPID PANEL
Chol/HDL Ratio: 2.8 ratio (ref 0.0–4.4)
Cholesterol, Total: 118 mg/dL (ref 100–199)
HDL: 42 mg/dL (ref >39)
LDL Calculated: 44 mg/dL (ref 0–99)
Triglycerides: 161 mg/dL — ABNORMAL HIGH (ref 0–149)
VLDL Cholesterol Cal: 32 mg/dL (ref 5–40)

## 2019-02-24 LAB — ANEMIA PROFILE B
Basophils Absolute: 0 10*3/uL (ref 0.0–0.2)
Basos: 0 %
EOS (ABSOLUTE): 0.1 10*3/uL (ref 0.0–0.4)
Eos: 3 %
Ferritin: 11 ng/mL — ABNORMAL LOW (ref 15–150)
Folate: 16.3 ng/mL (ref >3.0)
Hematocrit: 39.7 % (ref 34.0–46.6)
Hemoglobin: 13.2 g/dL (ref 11.1–15.9)
Immature Grans (Abs): 0 10*3/uL (ref 0.0–0.1)
Immature Granulocytes: 0 %
Iron Saturation: 18 % (ref 15–55)
Iron: 69 ug/dL (ref 27–139)
Lymphocytes Absolute: 1.6 10*3/uL (ref 0.7–3.1)
Lymphs: 36 %
MCH: 32.4 pg (ref 26.6–33.0)
MCHC: 33.2 g/dL (ref 31.5–35.7)
MCV: 97 fL (ref 79–97)
Monocytes Absolute: 0.3 10*3/uL (ref 0.1–0.9)
Monocytes: 6 %
Neutrophils Absolute: 2.5 10*3/uL (ref 1.4–7.0)
Neutrophils: 55 %
Platelets: 174 10*3/uL (ref 150–450)
RBC: 4.08 x10E6/uL (ref 3.77–5.28)
RDW: 13.8 % (ref 11.7–15.4)
Retic Ct Pct: 1.6 % (ref 0.6–2.6)
Total Iron Binding Capacity: 388 ug/dL (ref 250–450)
UIBC: 319 ug/dL (ref 118–369)
Vitamin B-12: >2000 pg/mL — ABNORMAL HIGH (ref 232–1245)
WBC: 4.5 10*3/uL (ref 3.4–10.8)

## 2019-02-24 LAB — CMP14+EGFR
ALT: 21 IU/L (ref 0–32)
AST: 24 IU/L (ref 0–40)
Albumin/Globulin Ratio: 1.9 (ref 1.2–2.2)
Albumin: 4.7 g/dL (ref 3.7–4.7)
Alkaline Phosphatase: 41 IU/L (ref 39–117)
BUN/Creatinine Ratio: 14 (ref 12–28)
BUN: 13 mg/dL (ref 8–27)
Bilirubin Total: 0.5 mg/dL (ref 0.0–1.2)
CO2: 26 mmol/L (ref 20–29)
Calcium: 9.9 mg/dL (ref 8.7–10.3)
Chloride: 100 mmol/L (ref 96–106)
Creatinine, Ser: 0.96 mg/dL (ref 0.57–1.00)
GFR calc Af Amer: 67 mL/min/{1.73_m2} (ref >59)
GFR calc non Af Amer: 58 mL/min/{1.73_m2} — ABNORMAL LOW (ref >59)
Globulin, Total: 2.5 g/dL (ref 1.5–4.5)
Glucose: 141 mg/dL — ABNORMAL HIGH (ref 65–99)
Potassium: 4.1 mmol/L (ref 3.5–5.2)
Sodium: 142 mmol/L (ref 134–144)
Total Protein: 7.2 g/dL (ref 6.0–8.5)

## 2019-02-24 LAB — MICROALBUMIN / CREATININE URINE RATIO
Creatinine, Urine: 36.3 mg/dL
Microalb/Creat Ratio: 37 mg/g creat — ABNORMAL HIGH (ref 0–29)
Microalbumin, Urine: 13.5 ug/mL

## 2019-02-24 LAB — VITAMIN D 25 HYDROXY (VIT D DEFICIENCY, FRACTURES): Vit D, 25-Hydroxy: 41.7 ng/mL (ref 30.0–100.0)

## 2019-02-24 LAB — TSH: TSH: 0.361 u[IU]/mL — ABNORMAL LOW (ref 0.450–4.500)

## 2019-02-24 MED ORDER — LEVOTHYROXINE SODIUM 88 MCG PO TABS: 88.0000 ug | ORAL_TABLET | Freq: Every day | ORAL | 11 refills | Status: DC

## 2019-03-08 ENCOUNTER — Other Ambulatory Visit: Payer: Self-pay | Admitting: Family

## 2019-03-11 ENCOUNTER — Other Ambulatory Visit: Payer: Self-pay | Admitting: Family

## 2019-03-11 DIAGNOSIS — E119 Type 2 diabetes mellitus without complications: Secondary | ICD-10-CM

## 2019-03-22 ENCOUNTER — Other Ambulatory Visit: Payer: Self-pay | Admitting: Family

## 2019-03-29 ENCOUNTER — Other Ambulatory Visit: Payer: Self-pay | Admitting: Family

## 2019-03-29 DIAGNOSIS — R42 Dizziness and giddiness: Secondary | ICD-10-CM

## 2019-04-08 DIAGNOSIS — M79676 Pain in unspecified toe(s): Secondary | ICD-10-CM | POA: Diagnosis not present

## 2019-04-08 DIAGNOSIS — L84 Corns and callosities: Secondary | ICD-10-CM | POA: Diagnosis not present

## 2019-04-08 DIAGNOSIS — E1142 Type 2 diabetes mellitus with diabetic polyneuropathy: Secondary | ICD-10-CM | POA: Diagnosis not present

## 2019-04-08 DIAGNOSIS — B351 Tinea unguium: Secondary | ICD-10-CM | POA: Diagnosis not present

## 2019-04-15 ENCOUNTER — Other Ambulatory Visit: Payer: Self-pay | Admitting: *Deleted

## 2019-04-15 ENCOUNTER — Other Ambulatory Visit: Payer: Self-pay

## 2019-04-15 ENCOUNTER — Other Ambulatory Visit: Payer: Medicare Other

## 2019-04-15 DIAGNOSIS — E039 Hypothyroidism, unspecified: Secondary | ICD-10-CM | POA: Diagnosis not present

## 2019-04-16 LAB — TSH: TSH: 2.41 u[IU]/mL (ref 0.450–4.500)

## 2019-04-20 DIAGNOSIS — Z23 Encounter for immunization: Secondary | ICD-10-CM | POA: Diagnosis not present

## 2019-04-29 ENCOUNTER — Ambulatory Visit: Payer: Self-pay | Admitting: Radiation Oncology

## 2019-05-05 ENCOUNTER — Telehealth: Payer: Self-pay | Admitting: *Deleted

## 2019-05-05 NOTE — Telephone Encounter (Signed)
Called patient to ask about coming later tomorrow for fu due to needing more time for visit, patient agreed to come on 05-06-19 @ 2:30 pm

## 2019-05-06 ENCOUNTER — Other Ambulatory Visit: Payer: Self-pay

## 2019-05-06 ENCOUNTER — Encounter: Payer: Self-pay | Admitting: Radiation Oncology

## 2019-05-06 ENCOUNTER — Ambulatory Visit
Admission: RE | Admit: 2019-05-06 | Discharge: 2019-05-06 | Disposition: A | Discharge status: Home / Self Care | Payer: Medicare Other | Source: Ambulatory Visit | Attending: Radiation Oncology | Admitting: Radiation Oncology

## 2019-05-06 VITALS — BP 131/80 | HR 67 | Temp 98.9°F | Resp 16 | Ht 61.0 in | Wt 177.8 lb

## 2019-05-06 DIAGNOSIS — Z923 Personal history of irradiation: Secondary | ICD-10-CM | POA: Insufficient documentation

## 2019-05-06 DIAGNOSIS — Z7982 Long term (current) use of aspirin: Secondary | ICD-10-CM | POA: Insufficient documentation

## 2019-05-06 DIAGNOSIS — Z79899 Other long term (current) drug therapy: Secondary | ICD-10-CM | POA: Diagnosis not present

## 2019-05-06 DIAGNOSIS — Z08 Encounter for follow-up examination after completed treatment for malignant neoplasm: Secondary | ICD-10-CM | POA: Diagnosis not present

## 2019-05-06 DIAGNOSIS — Z7984 Long term (current) use of oral hypoglycemic drugs: Secondary | ICD-10-CM | POA: Insufficient documentation

## 2019-05-06 DIAGNOSIS — Z8542 Personal history of malignant neoplasm of other parts of uterus: Secondary | ICD-10-CM | POA: Insufficient documentation

## 2019-05-06 DIAGNOSIS — C541 Malignant neoplasm of endometrium: Secondary | ICD-10-CM

## 2019-05-06 NOTE — Progress Notes (Signed)
Alexandria Irwin presents today for f/u with Dr. Sondra Come. Pt denies c/o pain. Pt reports occasional dysuria and urinary frequency. Pt denies hematuria. Pt denies vaginal bleeding/discharge. Pt denies rectal bleeding, diarrhea/constipation. Pt reports occasional abdominal bloating. Pt denies N/V.   BP 131/80 (BP Location: Right Arm, Patient Position: Sitting)   Pulse 67   Temp 98.9 F (37.2 C) (Temporal)   Resp 16   Ht 5\' 1"  (1.549 m)   Wt 177 lb 12.8 oz (80.6 kg)   SpO2 96%   BMI 33.60 kg/m   Wt Readings from Last 3 Encounters:  05/06/19 177 lb 12.8 oz (80.6 kg)  02/23/19 169 lb (76.7 kg)  12/30/18 173 lb 3.2 oz (78.6 kg)   Loma Sousa, RN BSN

## 2019-05-06 NOTE — Progress Notes (Signed)
Radiation Oncology         [phone] ________________________________  Name: Alexandria Irwin MRN: [id number]  Date: 05/06/2019  DOB: June 24, 1946  Follow-Up Visit Note  CC: Sharion Balloon, FNP  Everitt Amber, MD    ICD-10-CM   1. Endometrial cancer, FIGO stage IIIA (HCC)  C54.1     Diagnosis:   73 y.o. 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 She last saw Dr. Denman George on 12/30/2018, who noted no evidence of recurrence on clinical exam.  On review of systems, she reports occasional dysuria and urinary frequency, as well as occasional abdominal bloating. She denies hematuria, vaginal discharge or bleeding, rectal bleeding, diarrhea or constipation, nausea or vomiting, and pain.  She continues to use her vaginal dilator twice a week.  ALLERGIES:  is allergic to ace inhibitors; ciprocin-fluocin-procin [fluocinolone]; norvasc [amlodipine]; latex; neosporin [neomycin-bacitracin zn-polymyx]; and sulfa antibiotics.  Meds: Current Outpatient Medications  Medication Sig Dispense Refill  . acetaminophen (TYLENOL) 500 MG tablet Take 1,000 mg by mouth every 8 (eight) hours as needed for mild pain or headache.    Marland Kitchen aspirin EC 81 MG tablet Take 81 mg by mouth daily.    . Calcium Citrate-Vitamin D ([employer]S CALCIUM CITRATE +D PO) Take 2 tablets by mouth every evening.     . Cholecalciferol (D 2000) 2000 units TABS Take 2,000 Units by mouth daily.    . cyanocobalamin (,VITAMIN B-12,) 1000 MCG/ML injection Inject 1,000 mcg into the muscle every 30 (thirty) days.     . cycloSPORINE (RESTASIS) 0.05 % ophthalmic emulsion Place 1 drop into both eyes 2 (two) times daily.     . furosemide (LASIX) 20 MG tablet Take 1 tablet (20 mg  total) by mouth daily. 30 tablet 3  . glucose blood (ONETOUCH VERIO) test strip USE TO CHECK GLUCOSE DAILY 100 each 3  . Lancets (ONETOUCH DELICA PLUS VOZDGU44I) MISC 1 each by Other route daily. Dx E11.9 100 each 3  . levothyroxine (SYNTHROID) 88 MCG tablet Take 1 tablet (88 mcg total) by mouth daily. 30 tablet 11  . losartan (COZAAR) 100 MG tablet Take 1 tablet by mouth once daily 90 tablet 1  . Magnesium 250 MG TABS Take 250 mg by mouth every evening.     . meclizine (ANTIVERT) 25 MG tablet TAKE 1 TABLET BY MOUTH TWICE DAILY AS NEEDED FOR DIZZINESS 60 tablet 0  . metFORMIN (GLUCOPHAGE) 500 MG tablet TAKE 1 TABLET BY MOUTH TWICE DAILY WITH A MEAL 180 tablet 0  . metoprolol succinate (TOPROL-XL) 50 MG 24 hr tablet TAKE 1 TABLET BY MOUTH TWICE DAILY WITH MEALS 180 tablet 1  . Omega-3 Fatty Acids (FISH OIL) 1000 MG CAPS Take 1,000 mg by mouth every evening.    . simvastatin (ZOCOR) 40 MG tablet TAKE 1 TABLET BY MOUTH ONCE DAILY AT  6  PM 90 tablet 0  . sitaGLIPtin (JANUVIA) 50 MG tablet Take 1 tablet (50 mg total) by mouth daily. 90 tablet 0  . vitamin C (ASCORBIC ACID) 500 MG tablet Take 500 mg by mouth daily.     Current Facility-Administered Medications  Medication Dose Route Frequency Provider Last Rate Last Dose  . cyanocobalamin ((VITAMIN B-12)) injection 1,000 mcg  1,000 mcg Intramuscular Q30 days [place],  [name], FNP   1,000 mcg at 02/23/19 1540    Physical Findings: The patient is in no acute distress. Patient is alert and oriented.  height is 5' 1" (1.549 m) and weight is 177 lb 12.8 oz (80.6 kg). Her temporal temperature is 98.9 F (37.2 C). Her blood pressure is 131/80 and her pulse is 67. Her respiration is 16 and oxygen saturation is 96%.   Lungs are clear to auscultation bilaterally. Heart has regular rate and rhythm. No palpable cervical, supraclavicular, or axillary adenopathy. Abdomen soft, non-tender, normal bowel sounds.  On pelvic examination the external genitalia were  unremarkable. A speculum exam was performed. There are no mucosal lesions noted in the vaginal vault. Vaginal cuff intact. On bimanual  examination there were no pelvic masses appreciated.   Lab Findings: Lab Results  Component Value Date   WBC 4.5 02/23/2019   HGB 13.2 02/23/2019   HCT 39.7 02/23/2019   MCV 97 02/23/2019   PLT 174 02/23/2019                                                               Radiographic Findings: No results found.  Impression:   No evidence of recurrence on clinical exam.   Plan:  She will meet with Dr. Denman George in 3 months and follow up in radiation oncology in 6 months.  After these appointments the patient can then be switched to 75-monthfollow-up schedule as she will be 2 years out from her treatment.  ____________________________________  JBlair Promise PhD, MD  This document serves as a record of services personally performed by JGery Pray MD. It was created on his behalf by KWilburn Mylar a trained medical scribe. The creation of this record is based on the scribe's personal observations and the provider's statements to them. This document has been checked and approved by the attending provider.

## 2019-05-06 NOTE — Patient Instructions (Signed)
Coronavirus (COVID-19) Are you at risk?  Are you at risk for the Coronavirus (COVID-19)?  To be considered HIGH RISK for Coronavirus (COVID-19), you have to meet the following criteria:  . Traveled to [place], [place], [place], [place] or [place]; or in the [place] to [place], [place], [place], or [place]; and have fever, cough, and shortness of breath within the last 2 weeks of travel OR . Been in close contact with a person diagnosed with COVID-19 within the last 2 weeks and have fever, cough, and shortness of breath . IF YOU DO NOT MEET THESE CRITERIA, YOU ARE CONSIDERED LOW RISK FOR COVID-19.  What to do if you are HIGH RISK for COVID-19?  . If you are having a medical emergency, call 911. . Seek medical care right away. Before you go to a doctor's office, urgent care or emergency department, call ahead and tell them about your recent travel, contact with someone diagnosed with COVID-19, and your symptoms. You should receive instructions from your physician's office regarding next steps of care.  . When you arrive at healthcare provider, tell the healthcare staff immediately you have returned from visiting [place], [place], [place], [place] or [place]; or traveled in the [place] to [place], [place], [place], or [place]; in the last two weeks or you have been in close contact with a person diagnosed with COVID-19 in the last 2 weeks.   . Tell the health care staff about your symptoms: fever, cough and shortness of breath. . After you have been seen by a medical provider, you will be either: o Tested for (COVID-19) and discharged home on quarantine except to seek medical care if symptoms worsen, and asked to  - Stay home and avoid contact with others until you get your results (4-5 days)  - Avoid travel on public transportation if possible (such as bus, train, or airplane) or o Sent to the Emergency Department by EMS for evaluation, COVID-19 testing, and possible  admission depending on your condition and test results.  What to do if you are LOW RISK for COVID-19?  Reduce your risk of any infection by using the same precautions used for avoiding the common cold or flu:  . Wash your hands often with soap and warm water for at least 20 seconds.  If soap and water are not readily available, use an alcohol-based hand sanitizer with at least 60% alcohol.  . If coughing or sneezing, cover your mouth and nose by coughing or sneezing into the elbow areas of your shirt or coat, into a tissue or into your sleeve (not your hands). . Avoid shaking hands with others and consider head nods or verbal greetings only. . Avoid touching your eyes, nose, or mouth with unwashed hands.  . Avoid close contact with people who are sick. . Avoid places or events with large numbers of people in one location, like concerts or sporting events. . Carefully consider travel plans you have or are making. . If you are planning any travel outside or inside the [place], visit the CDC's Travelers' Health webpage for the latest health notices. . If you have some symptoms but not all symptoms, continue to monitor at home and seek medical attention if your symptoms worsen. . If you are having a medical emergency, call 911.   ADDITIONAL HEALTHCARE OPTIONS FOR PATIENTS  [facility] / e-Visit: [email or web address]         [facility]: [phone]  [facility]: [phone]                   [facility]: [phone]   

## 2019-05-21 ENCOUNTER — Other Ambulatory Visit: Payer: Self-pay | Admitting: Family

## 2019-05-21 DIAGNOSIS — R42 Dizziness and giddiness: Secondary | ICD-10-CM

## 2019-05-23 ENCOUNTER — Other Ambulatory Visit: Payer: Self-pay | Admitting: Family

## 2019-06-14 ENCOUNTER — Telehealth: Payer: Self-pay | Admitting: *Deleted

## 2019-06-14 NOTE — Telephone Encounter (Signed)
Patient called and scheduled an appt for January

## 2019-06-18 ENCOUNTER — Other Ambulatory Visit: Payer: Self-pay | Admitting: Family

## 2019-06-18 DIAGNOSIS — E119 Type 2 diabetes mellitus without complications: Secondary | ICD-10-CM

## 2019-06-22 ENCOUNTER — Ambulatory Visit (INDEPENDENT_AMBULATORY_CARE_PROVIDER_SITE_OTHER): Payer: Medicare Other | Admitting: Family

## 2019-06-22 ENCOUNTER — Encounter: Payer: Self-pay | Admitting: Family

## 2019-06-22 DIAGNOSIS — C541 Malignant neoplasm of endometrium: Secondary | ICD-10-CM

## 2019-06-22 DIAGNOSIS — E785 Hyperlipidemia, unspecified: Secondary | ICD-10-CM | POA: Diagnosis not present

## 2019-06-22 DIAGNOSIS — T451X5A Adverse effect of antineoplastic and immunosuppressive drugs, initial encounter: Secondary | ICD-10-CM

## 2019-06-22 DIAGNOSIS — E669 Obesity, unspecified: Secondary | ICD-10-CM

## 2019-06-22 DIAGNOSIS — E1169 Type 2 diabetes mellitus with other specified complication: Secondary | ICD-10-CM | POA: Diagnosis not present

## 2019-06-22 DIAGNOSIS — E538 Deficiency of other specified B group vitamins: Secondary | ICD-10-CM

## 2019-06-22 DIAGNOSIS — D6481 Anemia due to antineoplastic chemotherapy: Secondary | ICD-10-CM | POA: Diagnosis not present

## 2019-06-22 DIAGNOSIS — I10 Essential (primary) hypertension: Secondary | ICD-10-CM

## 2019-06-22 DIAGNOSIS — E039 Hypothyroidism, unspecified: Secondary | ICD-10-CM

## 2019-06-22 DIAGNOSIS — E1159 Type 2 diabetes mellitus with other circulatory complications: Secondary | ICD-10-CM | POA: Diagnosis not present

## 2019-06-22 NOTE — Progress Notes (Signed)
Virtual Visit via telephone Note Due to COVID-19 pandemic this visit was conducted virtually. This visit type was conducted due to national recommendations for restrictions regarding the COVID-19 Pandemic (e.g. social distancing, sheltering in place) in an effort to limit this patient's exposure and mitigate transmission in our community. All issues noted in this document were discussed and addressed.  A physical exam was not performed with this format.  I connected with Alexandria Irwin on 06/22/19 at 8:42 AM by telephone and verified that I am speaking with the correct person using two identifiers. Alexandria Irwin is currently located at home and husband & daughter is currently with her during visit. The provider, Evelina Dun, FNP is located in their office at time of visit.  I discussed the limitations, risks, security and privacy concerns of performing an evaluation and management service by telephone and the availability of in person appointments. I also discussed with the patient that there may be a patient responsible charge related to this service. The patient expressed understanding and agreed to proceed.   History and Present Illness:  Pt presents to the office today for chronic follow up. [name] Endometrial Adenocarcinoma and followed by Radiologists  every 3 months. She has completed chemo and radiation.She is followed by GI for cirrhosis Diabetes She presents for her follow-up diabetic visit. She has type 2 diabetes mellitus. Her disease course has been stable. There are no hypoglycemic associated symptoms. Pertinent negatives for hypoglycemia include no confusion. Associated symptoms include foot paresthesias. Pertinent negatives for diabetes include no blurred vision. There are no hypoglycemic complications. Symptoms are stable. Diabetic complications include peripheral neuropathy. Pertinent negatives for diabetic complications include no CVA or heart disease. Risk factors for coronary  artery disease include diabetes mellitus, obesity, hypertension and sedentary lifestyle. She is following a generally unhealthy diet. Her overall blood glucose range is >200 mg/dl. Eye exam is current.  Hypertension This is a chronic problem. The current episode started more than 1 year ago. The problem has been resolved since onset. The problem is controlled. Associated symptoms include malaise/fatigue. Pertinent negatives include no blurred vision, peripheral edema or shortness of breath. Risk factors for coronary artery disease include diabetes mellitus, obesity and sedentary lifestyle. The current treatment provides moderate improvement. There is no history of kidney disease, CAD/MI or CVA. Identifiable causes of hypertension include a thyroid problem.  Thyroid Problem Presents for follow-up visit. Symptoms include dry skin. Patient reports no constipation, diaphoresis or diarrhea. The symptoms have been stable. Her past medical history is significant for hyperlipidemia.  Hyperlipidemia This is a chronic problem. The current episode started more than 1 year ago. The problem is controlled. Recent lipid tests were reviewed and are normal. Exacerbating diseases include obesity. Pertinent negatives include no shortness of breath. Current antihyperlipidemic treatment includes statins. The current treatment provides moderate improvement of lipids. Risk factors for coronary artery disease include dyslipidemia, hypertension, a sedentary lifestyle and post-menopausal.  Anemia Presents for follow-up visit. Symptoms include malaise/fatigue. There has been no anorexia, bruising/bleeding easily or confusion.      Review of Systems  Constitutional: Positive for malaise/fatigue. Negative for diaphoresis.  Eyes: Negative for blurred vision.  Respiratory: Negative for shortness of breath.   Gastrointestinal: Negative for anorexia, constipation and diarrhea.  Endo/Heme/Allergies: Does not bruise/bleed easily.    Psychiatric/Behavioral: Negative for confusion.  All other systems reviewed and are negative.    Observations/Objective: No SOB or distress noted   Assessment and Plan: Alexandria Irwin comes in today with  chief complaint of No chief complaint on file.   Diagnosis and orders addressed:  1. Hypertension associated with diabetes ([place]) - CMP14+EGFR; Future - CBC with Differential/Platelet; Future  2. Type 2 diabetes mellitus with other specified complication, without long-term current use of insulin (HCC) - CMP14+EGFR; Future - CBC with Differential/Platelet; Future - hgba1c; Future  3. Hypothyroidism, unspecified type - CMP14+EGFR; Future - CBC with Differential/Platelet; Future - TSH; Future  4. Hyperlipidemia associated with type 2 diabetes mellitus (HCC) - CMP14+EGFR; Future - CBC with Differential/Platelet; Future  5. Endometrial adenocarcinoma ([place]) - CMP14+EGFR; Future - CBC with Differential/Platelet; Future  6. B12 deficiency - CMP14+EGFR; Future - CBC with Differential/Platelet; Future  7. Obesity (BMI 30-39.9) - CMP14+EGFR; Future - CBC with Differential/Platelet; Future  8. Anemia due to antineoplastic chemotherapy - CMP14+EGFR; Future - CBC with Differential/Platelet; Future   Labs pending Health Maintenance reviewed Diet and exercise encouraged  Follow up plan: 6 months      I discussed the assessment and treatment plan with the patient. The patient was provided an opportunity to ask questions and all were answered. The patient agreed with the plan and demonstrated an understanding of the instructions.   The patient was advised to call back or seek an in-person evaluation if the symptoms worsen or if the condition fails to improve as anticipated.  The above assessment and management plan was discussed with the patient. The patient verbalized understanding of and has agreed to the management plan. Patient is aware to call the clinic if symptoms  persist or worsen. Patient is aware when to return to the clinic for a follow-up visit. Patient educated on when it is appropriate to go to the emergency department.   Time call ended:  9: 03  I provided 21 minutes of non-face-to-face time during this encounter.    Evelina Dun, FNP

## 2019-06-29 ENCOUNTER — Other Ambulatory Visit: Payer: Self-pay | Admitting: Family

## 2019-06-29 ENCOUNTER — Other Ambulatory Visit: Payer: Medicare Other

## 2019-06-29 DIAGNOSIS — R42 Dizziness and giddiness: Secondary | ICD-10-CM

## 2019-07-02 ENCOUNTER — Encounter (HOSPITAL_COMMUNITY): Payer: Self-pay | Admitting: Emergency Medicine

## 2019-07-02 ENCOUNTER — Emergency Department (HOSPITAL_COMMUNITY)
Admission: EM | Admit: 2019-07-02 | Discharge: 2019-07-02 | Disposition: A | Discharge status: Home / Self Care | Payer: Medicare Other | Attending: Emergency Medicine | Admitting: Emergency Medicine

## 2019-07-02 DIAGNOSIS — I10 Essential (primary) hypertension: Secondary | ICD-10-CM

## 2019-07-02 DIAGNOSIS — Z7982 Long term (current) use of aspirin: Secondary | ICD-10-CM | POA: Diagnosis not present

## 2019-07-02 DIAGNOSIS — E039 Hypothyroidism, unspecified: Secondary | ICD-10-CM | POA: Insufficient documentation

## 2019-07-02 DIAGNOSIS — Z79899 Other long term (current) drug therapy: Secondary | ICD-10-CM | POA: Diagnosis not present

## 2019-07-02 DIAGNOSIS — Z7984 Long term (current) use of oral hypoglycemic drugs: Secondary | ICD-10-CM | POA: Insufficient documentation

## 2019-07-02 DIAGNOSIS — Y998 Other external cause status: Secondary | ICD-10-CM | POA: Insufficient documentation

## 2019-07-02 DIAGNOSIS — W182XXA Fall in (into) shower or empty bathtub, initial encounter: Secondary | ICD-10-CM | POA: Insufficient documentation

## 2019-07-02 DIAGNOSIS — S42201A Unspecified fracture of upper end of right humerus, initial encounter for closed fracture: Secondary | ICD-10-CM | POA: Diagnosis not present

## 2019-07-02 DIAGNOSIS — Y92012 Bathroom of single-family (private) house as the place of occurrence of the external cause: Secondary | ICD-10-CM | POA: Insufficient documentation

## 2019-07-02 DIAGNOSIS — Z9221 Personal history of antineoplastic chemotherapy: Secondary | ICD-10-CM | POA: Insufficient documentation

## 2019-07-02 DIAGNOSIS — S42293B Other displaced fracture of upper end of unspecified humerus, initial encounter for open fracture: Secondary | ICD-10-CM

## 2019-07-02 DIAGNOSIS — Z8542 Personal history of malignant neoplasm of other parts of uterus: Secondary | ICD-10-CM | POA: Diagnosis not present

## 2019-07-02 DIAGNOSIS — E1165 Type 2 diabetes mellitus with hyperglycemia: Secondary | ICD-10-CM | POA: Insufficient documentation

## 2019-07-02 DIAGNOSIS — E1140 Type 2 diabetes mellitus with diabetic neuropathy, unspecified: Secondary | ICD-10-CM | POA: Insufficient documentation

## 2019-07-02 DIAGNOSIS — W19XXXA Unspecified fall, initial encounter: Secondary | ICD-10-CM

## 2019-07-02 DIAGNOSIS — Y93E1 Activity, personal bathing and showering: Secondary | ICD-10-CM | POA: Insufficient documentation

## 2019-07-02 DIAGNOSIS — S4991XA Unspecified injury of right shoulder and upper arm, initial encounter: Secondary | ICD-10-CM | POA: Diagnosis present

## 2019-07-02 DIAGNOSIS — Z91040 Latex allergy status: Secondary | ICD-10-CM | POA: Diagnosis not present

## 2019-07-02 DIAGNOSIS — R739 Hyperglycemia, unspecified: Secondary | ICD-10-CM

## 2019-07-02 HISTORY — DX: Unspecified fall, initial encounter: W19.XXXA

## 2019-07-02 HISTORY — DX: Other displaced fracture of upper end of unspecified humerus, initial encounter for open fracture: S42.293B

## 2019-07-02 LAB — CBC WITH DIFFERENTIAL/PLATELET
Abs Immature Granulocytes: 0.03 10*3/uL (ref 0.00–0.07)
Basophils Absolute: 0 10*3/uL (ref 0.0–0.1)
Basophils Relative: 0 %
Eosinophils Absolute: 0.1 10*3/uL (ref 0.0–0.5)
Eosinophils Relative: 1 %
HCT: 35.6 % — ABNORMAL LOW (ref 36.0–46.0)
Hemoglobin: 11.6 g/dL — ABNORMAL LOW (ref 12.0–15.0)
Immature Granulocytes: 0 %
Lymphocytes Relative: 19 %
Lymphs Abs: 1.4 10*3/uL (ref 0.7–4.0)
MCH: 32 pg (ref 26.0–34.0)
MCHC: 32.6 g/dL (ref 30.0–36.0)
MCV: 98.3 fL (ref 80.0–100.0)
Monocytes Absolute: 0.4 10*3/uL (ref 0.1–1.0)
Monocytes Relative: 5 %
Neutro Abs: 5.7 10*3/uL (ref 1.7–7.7)
Neutrophils Relative %: 75 %
Platelets: 190 10*3/uL (ref 150–400)
RBC: 3.62 MIL/uL — ABNORMAL LOW (ref 3.87–5.11)
RDW: 13.4 % (ref 11.5–15.5)
WBC: 7.6 10*3/uL (ref 4.0–10.5)
nRBC: 0 % (ref 0.0–0.2)

## 2019-07-02 LAB — COMPREHENSIVE METABOLIC PANEL
ALT: 22 U/L (ref 0–44)
AST: 26 U/L (ref 15–41)
Albumin: 3.8 g/dL (ref 3.5–5.0)
Alkaline Phosphatase: 39 U/L (ref 38–126)
Anion gap: 10 (ref 5–15)
BUN: 17 mg/dL (ref 8–23)
CO2: 27 mmol/L (ref 22–32)
Calcium: 9.7 mg/dL (ref 8.9–10.3)
Chloride: 101 mmol/L (ref 98–111)
Creatinine, Ser: 0.96 mg/dL (ref 0.44–1.00)
GFR calc Af Amer: >60 mL/min (ref >60)
GFR calc non Af Amer: 58 mL/min — ABNORMAL LOW (ref >60)
Glucose, Bld: 222 mg/dL — ABNORMAL HIGH (ref 70–99)
Potassium: 4.8 mmol/L (ref 3.5–5.1)
Sodium: 138 mmol/L (ref 135–145)
Total Bilirubin: 1.2 mg/dL (ref 0.3–1.2)
Total Protein: 7.2 g/dL (ref 6.5–8.1)

## 2019-07-02 NOTE — ED Provider Notes (Signed)
[facility] EMERGENCY DEPARTMENT Provider Note   CSN: [id number] Arrival date & time: 07/02/19  1303     History Chief Complaint  Patient presents with  . Fall  . Arm Injury    Alexandria Irwin is a 74 y.o. female.  HPI   Patient presents to the emergency room for evaluation of a shoulder injury.  Patient was showering this morning when she slipped and fell onto her right shoulder.  Patient started having pain and limited mobility of her right shoulder.  She went to an urgent care and had x-rays.  Patient was told that she had a fracture.  Patient states the provider there was concerned that she might need to have surgery and they recommended she see an orthopedic doctor.  Patient came to the ED.  Patient denies any other injuries.  No numbness or weakness.  Past Medical History:  Diagnosis Date  . Cataract   . Endometrial adenocarcinoma ([place])   . Environmental and seasonal allergies   . Family history of ovarian cancer   . Family history of pancreatic cancer   . Genetic testing 09/19/2017   Multi-Cancer panel (83 genes) @ [facility] - No pathogenic mutations detected  . GERD (gastroesophageal reflux disease)   . History of DVT of lower extremity    early 2000s--- lower right leg  . Hyperlipidemia   . Hypertension   . Hypothyroidism   . Type 2 diabetes mellitus ([place])    followed by pcp  . Umbilical hernia   . Vitamin B 12 deficiency   . Vitamin D deficiency   . Wears glasses     Patient Active Problem List   Diagnosis Date Noted  . Pancytopenia, acquired ([place]) 12/16/2017  . Anemia due to antineoplastic chemotherapy 11/26/2017  . Peripheral neuropathy due to chemotherapy ([place]) 10/14/2017  . Genetic testing 09/19/2017  . Family history of ovarian cancer   . Family history of pancreatic cancer   . Obesity (BMI 30-39.9) 08/26/2017  . Easy bruising 08/18/2017  . Endometrial cancer, FIGO stage IIIA ([place]) 08/12/2017  . Endometrial cancer ([place]) 08/05/2017  .  B12 deficiency 06/26/2017  . Endometrial adenocarcinoma ([place]) 06/25/2017  . Umbilical hernia without obstruction and without gangrene 06/25/2017  . Vitamin D deficiency   . Thrombophlebitis leg 09/18/2013  . Hypertension associated with diabetes ([place]) 12/03/2012  . Diabetes ([place]) 12/03/2012  . Hypothyroidism 12/03/2012  . Hyperlipidemia associated with type 2 diabetes mellitus ([place]) 12/03/2012    Past Surgical History:  Procedure Laterality Date  . BREAST BIOPSY Left 2009   benign  . CATARACT EXTRACTION W/ INTRAOCULAR LENS IMPLANT Left 2018  . CATARACT EXTRACTION W/PHACO Right 03/14/2014   Procedure: CATARACT EXTRACTION PHACO AND INTRAOCULAR LENS PLACEMENT (IOC);  Surgeon: Tonny Branch, MD;  Location: [facility];  Service: Ophthalmology;  Laterality: Right;  CDE: 8.34  . FOOT GANGLION EXCISION Right 2013  . HYSTEROSCOPY WITH D & C N/A 06/13/2017   Procedure: DILATATION AND CURETTAGE /HYSTEROSCOPY;  Surgeon: Alden Hipp, MD;  Location: [facility];  Service: Gynecology;  Laterality: N/A;  . IR FLUORO GUIDE PORT INSERTION RIGHT  08/27/2017  . IR REMOVAL TUN ACCESS W/ PORT W/O FL MOD SED  04/27/2018  . IR US GUIDE VASC ACCESS RIGHT  08/27/2017  . LAPAROSCOPIC CHOLECYSTECTOMY  2005  . ROBOTIC ASSISTED TOTAL HYSTERECTOMY WITH BILATERAL SALPINGO OOPHERECTOMY Bilateral 08/05/2017   Procedure: ROBOTIC ASSISTED TOTAL HYSTERECTOMY WITH BILATERAL SALPINGO OOPHORECTOMY;  Surgeon: Everitt Amber, MD;  Location: [facility];  Service: Gynecology;  Laterality: Bilateral;  . SENTINEL NODE BIOPSY Bilateral 08/05/2017   Procedure: SENTINEL NODE BIOPSY;  Surgeon: Everitt Amber, MD;  Location: [facility];  Service: Gynecology;  Laterality: Bilateral;  . UMBILICAL HERNIA REPAIR N/A 08/05/2017   Procedure: HERNIA REPAIR UMBILICAL ADULT;  Surgeon: Everitt Amber, MD;  Location: [facility];  Service: Gynecology;  Laterality: N/A;  . VEIN SURGERY Right 2011   vein stripping-leg  and laser treatment     OB History   No obstetric history on  file.     Family History  Problem Relation Age of Onset  . Pancreatic cancer Mother 49       deceased 38  . Ovarian cancer Mother        dx 48s  . Cancer Other 54       maternal half-sister; unk. type    Social History   Tobacco Use  . Smoking status: Never Smoker  . Smokeless tobacco: Never Used  Substance Use Topics  . Alcohol use: No  . Drug use: No    Home Medications Prior to Admission medications   Medication Sig Start Date End Date Taking? Authorizing Provider  acetaminophen (TYLENOL) 500 MG tablet Take 1,000 mg by mouth every 8 (eight) hours as needed for mild pain or headache.    [provider]  aspirin EC 81 MG tablet Take 81 mg by mouth daily.    [provider]  Calcium Citrate-Vitamin D ([employer] CALCIUM CITRATE +D PO) Take 2 tablets by mouth every evening.     [provider]  Cholecalciferol (D 2000) 2000 units TABS Take 2,000 Units by mouth daily.    [provider]  cyanocobalamin (,VITAMIN B-12,) 1000 MCG/ML injection Inject 1,000 mcg into the muscle every 30 (thirty) days.     [provider]  cycloSPORINE (RESTASIS) 0.05 % ophthalmic emulsion Place 1 drop into both eyes 2 (two) times daily.     [provider]  furosemide (LASIX) 20 MG tablet Take 1 tablet by mouth once daily 06/29/19   Sharion Balloon, FNP  JANUVIA 50 MG tablet Take 1 tablet by mouth once daily 06/29/19   Sharion Balloon, FNP  Lancets [facility] DELICA PLUS [id number]) [place] 1 each by Other route daily. Dx E11.9 03/12/19   Sharion Balloon, FNP  levothyroxine (SYNTHROID) 88 MCG tablet Take 1 tablet (88 mcg total) by mouth daily. 02/24/19 02/24/20  Sharion Balloon, FNP  losartan (COZAAR) 100 MG tablet Take 1 tablet by mouth once daily 03/22/19   Evelina Dun A, FNP  Magnesium 250 MG TABS Take 250 mg by mouth every evening.     [provider]  meclizine (ANTIVERT) 25 MG tablet TAKE 1 TABLET BY MOUTH TWICE DAILY AS NEEDED FOR DIZZINESS  05/21/19   Evelina Dun A, FNP  metFORMIN (GLUCOPHAGE) 500 MG tablet TAKE 1 TABLET BY MOUTH TWICE DAILY WITH A MEAL 05/24/19   Hawks, Alyse Low A, FNP  metoprolol succinate (TOPROL-XL) 50 MG 24 hr tablet TAKE 1 TABLET BY MOUTH TWICE DAILY WITH MEALS 03/22/19   Hawks, Christy A, FNP  Omega-3 Fatty Acids (FISH OIL) 1000 MG CAPS Take 1,000 mg by mouth every evening.    [provider]  [facility] VERIO test strip USE 1 STRIP TO CHECK GLUCOSE ONCE DAILY 06/18/19   Evelina Dun A, FNP  simvastatin (ZOCOR) 40 MG tablet TAKE 1 TABLET BY MOUTH ONCE DAILY AT  [facility] 05/24/19   Evelina Dun A, FNP  vitamin C (ASCORBIC ACID) 500 MG  tablet Take 500 mg by mouth daily.    [provider]  metoprolol (LOPRESSOR) 50 MG tablet TAKE 1 TABLET (50 MG TOTAL) BY MOUTH 2 (TWO) TIMES DAILY. 09/17/13 12/13/13  Erby Pian, FNP    Allergies    Ace inhibitors, Ciprocin-fluocin-procin [fluocinolone], Norvasc [amlodipine], Latex, Neosporin [neomycin-bacitracin zn-polymyx], and Sulfa antibiotics  Review of Systems   Review of Systems  All other systems reviewed and are negative.   Physical Exam Updated Vital Signs BP (!) 170/84 (BP Location: Right Arm)   Pulse 63   Temp 98.4 F (36.9 C) (Oral)   Resp 16   SpO2 100%   Physical Exam Vitals and nursing note reviewed.  Constitutional:      General: She is not in acute distress.    Appearance: She is well-developed.  HENT:     Head: Normocephalic and atraumatic.     Right Ear: External ear normal.     Left Ear: External ear normal.  Eyes:     General: No scleral icterus.       Right eye: No discharge.        Left eye: No discharge.     Conjunctiva/sclera: Conjunctivae normal.  Neck:     Trachea: No tracheal deviation.  Cardiovascular:     Rate and Rhythm: Normal rate.  Pulmonary:     Effort: Pulmonary effort is normal. No respiratory distress.     Breath sounds: No stridor.  Abdominal:     General: There is no distension.    Musculoskeletal:        General: No swelling or deformity.     Right shoulder: Tenderness present. Decreased range of motion.     Right elbow: Normal.     Cervical back: Neck supple.  Skin:    General: Skin is warm and dry.     Findings: No rash.  Neurological:     Mental Status: She is alert.     Cranial Nerves: Cranial nerve deficit: no gross deficits.     ED Results / Procedures / Treatments   Labs (all labs ordered are listed, but only abnormal results are displayed) Labs Reviewed  COMPREHENSIVE METABOLIC PANEL - Abnormal; Notable for the following components:      Result Value   Glucose, Bld 222 (*)    GFR calc non Af Amer 58 (*)    All other components within normal limits  CBC WITH DIFFERENTIAL/PLATELET - Abnormal; Notable for the following components:   RBC 3.62 (*)    Hemoglobin 11.6 (*)    HCT 35.6 (*)    All other components within normal limits    EKG None  Radiology No results found.  Procedures Procedures (including critical care time)  Medications Ordered in ED Medications - No data to display  ED Course  I have reviewed the triage vital signs and the nursing notes.  Pertinent labs & imaging results that were available during my care of the patient were reviewed by me and considered in my medical decision making (see chart for details).  Clinical Course as of Jul 01 1446  Fri Jul 02, 2019  1446 Labs reviewed.  Hyperglycemia noted.  No other acute abnormalities.   [JK]    Clinical Course User Index [JK] Dorie Rank, MD   MDM Rules/Calculators/A&P                      I reviewed the images from the urgent care.  Patient does have a fracture  of the humeral head.  There is mild displacement.  Patient is neurovascularly intact.  Patient does not require emergent orthopedic evaluation.  She has a sling placed.  I do recommend outpatient follow-up with an orthopedic doctor and I will provide her with the telephone number for Dr. Georgeanna Harrison  office. Final Clinical Impression(s) / ED Diagnoses Final diagnoses:  Closed fracture of proximal end of right humerus, unspecified fracture morphology, initial encounter  Hyperglycemia  Hypertension, unspecified type    Rx / DC Orders ED Discharge Orders    None       Dorie Rank, MD 07/02/19 1448

## 2019-07-02 NOTE — ED Triage Notes (Signed)
Pt to ER for evaluation of right humerus fracture. She has had imaging completed with confirmation. She reports she got into the shower this morning and the shower pad slid out from under her.

## 2019-07-02 NOTE — ED Notes (Signed)
Patient verbalizes understanding of discharge instructions. Opportunity for questioning and answers were provided. Armband removed by staff, pt discharged from ED.   Also reviewed d/c instructions with pt's husband as well.

## 2019-07-02 NOTE — Discharge Instructions (Signed)
Follow-up with your primary doctor routinely regarding your high blood pressure and blood sugar.  Follow-up with an orthopedic doctor for further treatment of your proximal humerus fracture.  Continue to wear your sling.

## 2019-07-14 DIAGNOSIS — M25511 Pain in right shoulder: Secondary | ICD-10-CM | POA: Diagnosis not present

## 2019-07-20 ENCOUNTER — Other Ambulatory Visit: Payer: Medicare Other

## 2019-07-20 ENCOUNTER — Other Ambulatory Visit: Payer: Self-pay

## 2019-07-20 DIAGNOSIS — E1169 Type 2 diabetes mellitus with other specified complication: Secondary | ICD-10-CM | POA: Diagnosis not present

## 2019-07-20 DIAGNOSIS — D6481 Anemia due to antineoplastic chemotherapy: Secondary | ICD-10-CM | POA: Diagnosis not present

## 2019-07-20 DIAGNOSIS — C541 Malignant neoplasm of endometrium: Secondary | ICD-10-CM | POA: Diagnosis not present

## 2019-07-20 DIAGNOSIS — E669 Obesity, unspecified: Secondary | ICD-10-CM | POA: Diagnosis not present

## 2019-07-20 DIAGNOSIS — E039 Hypothyroidism, unspecified: Secondary | ICD-10-CM | POA: Diagnosis not present

## 2019-07-20 DIAGNOSIS — E1159 Type 2 diabetes mellitus with other circulatory complications: Secondary | ICD-10-CM | POA: Diagnosis not present

## 2019-07-20 DIAGNOSIS — E785 Hyperlipidemia, unspecified: Secondary | ICD-10-CM | POA: Diagnosis not present

## 2019-07-20 DIAGNOSIS — I10 Essential (primary) hypertension: Secondary | ICD-10-CM | POA: Diagnosis not present

## 2019-07-20 DIAGNOSIS — E538 Deficiency of other specified B group vitamins: Secondary | ICD-10-CM | POA: Diagnosis not present

## 2019-07-20 DIAGNOSIS — T451X5A Adverse effect of antineoplastic and immunosuppressive drugs, initial encounter: Secondary | ICD-10-CM | POA: Diagnosis not present

## 2019-07-20 LAB — BAYER DCA HB A1C WAIVED: HB A1C (BAYER DCA - WAIVED): 8.4 % — ABNORMAL HIGH (ref <7.0)

## 2019-07-20 NOTE — Addendum Note (Signed)
Addended by: Earlene Plater on: 07/20/2019 08:13 AM   Modules accepted: Orders

## 2019-07-21 LAB — CMP14+EGFR
ALT: 20 IU/L (ref 0–32)
AST: 20 IU/L (ref 0–40)
Albumin/Globulin Ratio: 1.7 (ref 1.2–2.2)
Albumin: 4.3 g/dL (ref 3.7–4.7)
Alkaline Phosphatase: 91 IU/L (ref 39–117)
BUN/Creatinine Ratio: 21 (ref 12–28)
BUN: 21 mg/dL (ref 8–27)
Bilirubin Total: 0.3 mg/dL (ref 0.0–1.2)
CO2: 26 mmol/L (ref 20–29)
Calcium: 10.1 mg/dL (ref 8.7–10.3)
Chloride: 99 mmol/L (ref 96–106)
Creatinine, Ser: 0.99 mg/dL (ref 0.57–1.00)
GFR calc Af Amer: 64 mL/min/{1.73_m2} (ref >59)
GFR calc non Af Amer: 56 mL/min/{1.73_m2} — ABNORMAL LOW (ref >59)
Globulin, Total: 2.5 g/dL (ref 1.5–4.5)
Glucose: 189 mg/dL — ABNORMAL HIGH (ref 65–99)
Potassium: 5.3 mmol/L — ABNORMAL HIGH (ref 3.5–5.2)
Sodium: 138 mmol/L (ref 134–144)
Total Protein: 6.8 g/dL (ref 6.0–8.5)

## 2019-07-21 LAB — CBC WITH DIFFERENTIAL/PLATELET
Basophils Absolute: 0 10*3/uL (ref 0.0–0.2)
Basos: 1 %
EOS (ABSOLUTE): 0.2 10*3/uL (ref 0.0–0.4)
Eos: 3 %
Hematocrit: 35 % (ref 34.0–46.6)
Hemoglobin: 11.7 g/dL (ref 11.1–15.9)
Immature Grans (Abs): 0 10*3/uL (ref 0.0–0.1)
Immature Granulocytes: 0 %
Lymphocytes Absolute: 2.1 10*3/uL (ref 0.7–3.1)
Lymphs: 36 %
MCH: 31.5 pg (ref 26.6–33.0)
MCHC: 33.4 g/dL (ref 31.5–35.7)
MCV: 94 fL (ref 79–97)
Monocytes Absolute: 0.4 10*3/uL (ref 0.1–0.9)
Monocytes: 7 %
Neutrophils Absolute: 3.2 10*3/uL (ref 1.4–7.0)
Neutrophils: 53 %
Platelets: 260 10*3/uL (ref 150–450)
RBC: 3.71 x10E6/uL — ABNORMAL LOW (ref 3.77–5.28)
RDW: 13.2 % (ref 11.7–15.4)
WBC: 5.9 10*3/uL (ref 3.4–10.8)

## 2019-07-21 LAB — TSH: TSH: 2.05 u[IU]/mL (ref 0.450–4.500)

## 2019-07-23 ENCOUNTER — Other Ambulatory Visit: Payer: Self-pay | Admitting: Family

## 2019-07-23 MED ORDER — METFORMIN HCL ER 500 MG PO TB24: 1000.0000 mg | ORAL_TABLET | Freq: Every day | ORAL | 1 refills | Status: DC

## 2019-07-25 ENCOUNTER — Other Ambulatory Visit: Payer: Self-pay

## 2019-07-25 DIAGNOSIS — R42 Dizziness and giddiness: Secondary | ICD-10-CM

## 2019-07-26 MED ORDER — FUROSEMIDE 20 MG PO TABS: 20.0000 mg | ORAL_TABLET | Freq: Every day | ORAL | 0 refills | Status: DC

## 2019-07-26 MED ORDER — MECLIZINE HCL 25 MG PO TABS: 25.0000 mg | ORAL_TABLET | Freq: Two times a day (BID) | ORAL | 0 refills | Status: DC | PRN

## 2019-07-28 DIAGNOSIS — M25511 Pain in right shoulder: Secondary | ICD-10-CM | POA: Diagnosis not present

## 2019-07-29 ENCOUNTER — Inpatient Hospital Stay: Payer: Medicare Other | Attending: Gynecologic Oncology | Admitting: Gynecologic Oncology

## 2019-07-29 ENCOUNTER — Ambulatory Visit: Payer: Medicare Other

## 2019-07-29 ENCOUNTER — Other Ambulatory Visit: Payer: Self-pay

## 2019-07-29 ENCOUNTER — Encounter: Payer: Self-pay | Admitting: Gynecologic Oncology

## 2019-07-29 VITALS — BP 125/63 | HR 70 | Temp 97.9°F | Resp 18 | Ht 61.0 in | Wt 178.8 lb

## 2019-07-29 DIAGNOSIS — Z90722 Acquired absence of ovaries, bilateral: Secondary | ICD-10-CM | POA: Insufficient documentation

## 2019-07-29 DIAGNOSIS — Z79899 Other long term (current) drug therapy: Secondary | ICD-10-CM | POA: Insufficient documentation

## 2019-07-29 DIAGNOSIS — C541 Malignant neoplasm of endometrium: Secondary | ICD-10-CM | POA: Insufficient documentation

## 2019-07-29 DIAGNOSIS — E785 Hyperlipidemia, unspecified: Secondary | ICD-10-CM | POA: Diagnosis not present

## 2019-07-29 DIAGNOSIS — E7800 Pure hypercholesterolemia, unspecified: Secondary | ICD-10-CM | POA: Insufficient documentation

## 2019-07-29 DIAGNOSIS — Z7984 Long term (current) use of oral hypoglycemic drugs: Secondary | ICD-10-CM | POA: Diagnosis not present

## 2019-07-29 DIAGNOSIS — E119 Type 2 diabetes mellitus without complications: Secondary | ICD-10-CM | POA: Insufficient documentation

## 2019-07-29 DIAGNOSIS — Z90710 Acquired absence of both cervix and uterus: Secondary | ICD-10-CM | POA: Diagnosis not present

## 2019-07-29 DIAGNOSIS — Z86718 Personal history of other venous thrombosis and embolism: Secondary | ICD-10-CM | POA: Insufficient documentation

## 2019-07-29 DIAGNOSIS — I10 Essential (primary) hypertension: Secondary | ICD-10-CM | POA: Insufficient documentation

## 2019-07-29 DIAGNOSIS — K219 Gastro-esophageal reflux disease without esophagitis: Secondary | ICD-10-CM | POA: Insufficient documentation

## 2019-07-29 DIAGNOSIS — E039 Hypothyroidism, unspecified: Secondary | ICD-10-CM | POA: Diagnosis not present

## 2019-07-29 DIAGNOSIS — E559 Vitamin D deficiency, unspecified: Secondary | ICD-10-CM | POA: Diagnosis not present

## 2019-07-29 DIAGNOSIS — Z9221 Personal history of antineoplastic chemotherapy: Secondary | ICD-10-CM | POA: Insufficient documentation

## 2019-07-29 DIAGNOSIS — E538 Deficiency of other specified B group vitamins: Secondary | ICD-10-CM | POA: Insufficient documentation

## 2019-07-29 DIAGNOSIS — Z923 Personal history of irradiation: Secondary | ICD-10-CM | POA: Diagnosis not present

## 2019-07-29 NOTE — Progress Notes (Signed)
Follow-up Note: Gyn-Onc  Consult was requested by Dr. Deatra Ina for the evaluation of Alexandria Irwin 74 y.o. female  CC:  Chief Complaint  Patient presents with  . Endometrial adenocarcinoma [facility])    Assessment/Plan:  Alexandria Irwin  is a 74 y.o.  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 Genetic testing negative.  S/p adjuvant carboplatin and paclitaxel chemotherapy x 6 cycles with adjuvant vaginal brachytherapy for local control completed in June, 2019.  Complete clinical response.   I will see Alexandria Irwin back in 6 months. She will see Dr Sondra Come in 3 months.   HPI: Ms. Alexandria Irwin is a 74 year old para 4 who was seen in consultation at the request of Dr. Deatra Ina for grade 1 endometrial cancer.  The patient has a history of postmenopausal bleeding since November 2017.  At this time she saw her primary care doctor who diagnosed a urinary tract infection and treated her intermittently with antibiotics for approximately 3 months.  However given the persistence of the symptoms she then sought evaluation with her gynecologist Dr. Deatra Ina in April 2018.  At this time a transvaginal ultrasound scan was performed which showed some fibroids in the uterus but no thickening of the endometrium.  Pap smear and pelvic examination were unremarkable and therefore it was felt that the source of bleeding was not gynecologic.  She was then sent to Dr. Noah Delaine from [facility] who performed an evaluation including a CT scan of the abdomen and pelvis in September 2018.  This showed grossly normal urologic system and no evidence of extrauterine disease however the uterus was slightly 4 cm intrauterine mass concerning for neoplasm.  Dr. Alyson Ingles then performed a cystoscopy which was also unremarkable.  She was referred back to Dr. Deatra Ina for further evaluation.  In October 2018 Dr. Deatra Ina repeated the pelvic ultrasound scan  which again confirmed what appeared to be a thin endometrium with fibroids.  However given the persistence of bleeding and the CT findings he took the patient to the operating room on June 13, 2017 and performed a hysteroscopy D&C and polypectomy.  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  The patient otherwise has medical history significant for a DVT in the right lower extremity approximately 12 years ago that was spontaneous.  She was treated with aspirin 325 mg daily but no anti-thrombotic agents.  She has had no recurrences since that time.  She has a history of type 2 diabetes mellitus with fairly poorly controlled with an HbA1c earlier in 2018 of 9.2%.  At that time she is taking only metformin.  She was then added Januvia.  Follow-up HbA1c in November 2018 had reduced to 7.2%.  The patient reports fasting blood glucose levels between 07/31/1950.  Her primary care doctor Dr. Lenna Gilford manages her blood glucose.  Patient also has hypertension hypercholesterolemia.  She denies a coronary artery disease history.  She has had 4 prior vaginal deliveries.  She has no family history concerning for Lynch syndrome.  She is obese with a height of 5 foot 2 inches and a weight of 174 pounds.  Her prior abdominal surgery includes a laparoscopic cholecystectomy.  She has an umbilical hernia. On 08/05/17 she underwent a robotic assisted total hysterectomy, BSO, sentinel lymph node biopsy.  Surgery also included an umbilical hernia repair.  The final pathology revealed a uterus with a FIGO grade 2 endometrioid adenocarcinoma involving the full-thickness of the  posterior myometrium with tumor present at the inked serosal surface.  There was also microscopic involvement of the left fallopian tube.  Sentinel lymph nodes bilaterally were negative as was the cervix.  Lymphovascular space invasion was not identified.  The patient was staged as having stage III a grade 2 endometrioid adenocarcinoma  and was recommended to proceed with adjuvant therapy with carboplatin paclitaxel chemotherapy and vaginal brachytherapy.  She demonstrated loss of mL H1 nuclear expression on her pathology and therefore genetics consultation was recommended, but no genetic mutations were identified on comprehensive testing in March, 2019.  She went on to complete 6 cycles of carb/taxol chemotherapy (completed June, 2019) and 30Gy (in 5 fractions) of vaginal brachytherapy. She tolerated therapy well.  Post treatment imaging with CT abd/pelvis on 01/13/18 showed bilateral lymphocysts measuring up to 5cm. These had slightly increased from postop imaging. Her bladder wall was also thickened. No definitive recurrent/persistent tumor. Follow-up CT imaging in October, 2019 showed reduction in size of lymphocysts.   The patient reports a history of a right DVT "years ago" and chronic right LE edema since that time. She has mild intermittent left foot and ankle swelling. It has been stable.  Interval Hx:  She fractured her arm in January, 2021. This was an upper humoral fracture.  Current Meds:  Outpatient Encounter Medications as of 07/29/2019  Medication Sig  . acetaminophen (TYLENOL) 500 MG tablet Take 1,000 mg by mouth every 8 (eight) hours as needed for mild pain or headache.  Marland Kitchen aspirin EC 81 MG tablet Take 81 mg by mouth daily.  . Calcium Citrate-Vitamin D ([employer] CALCIUM CITRATE +D PO) Take 2 tablets by mouth every evening.   . Cholecalciferol (D 2000) 2000 units TABS Take 2,000 Units by mouth daily.  . cycloSPORINE (RESTASIS) 0.05 % ophthalmic emulsion Place 1 drop into both eyes 2 (two) times daily.   . furosemide (LASIX) 20 MG tablet Take 1 tablet (20 mg total) by mouth daily.  Marland Kitchen JANUVIA 50 MG tablet Take 1 tablet by mouth once daily  . Lancets (ONETOUCH DELICA PLUS ZOXWRU04V) MISC 1 each by Other route daily. Dx E11.9  . levothyroxine (SYNTHROID) 88 MCG tablet Take 1 tablet (88 mcg total) by mouth daily.  Marland Kitchen  losartan (COZAAR) 100 MG tablet Take 1 tablet by mouth once daily  . Magnesium 250 MG TABS Take 250 mg by mouth every evening.   . meclizine (ANTIVERT) 25 MG tablet Take 1 tablet (25 mg total) by mouth 2 (two) times daily as needed for dizziness.  . metFORMIN (GLUCOPHAGE XR) 500 MG 24 hr tablet Take 2 tablets (1,000 mg total) by mouth daily with breakfast.  . metoprolol succinate (TOPROL-XL) 50 MG 24 hr tablet TAKE 1 TABLET BY MOUTH TWICE DAILY WITH MEALS  . Omega-3 Fatty Acids (FISH OIL) 1000 MG CAPS Take 1,000 mg by mouth every evening.  Glory Rosebush VERIO test strip USE 1 STRIP TO CHECK GLUCOSE ONCE DAILY  . simvastatin (ZOCOR) 40 MG tablet TAKE 1 TABLET BY MOUTH ONCE DAILY AT  6PM  . vitamin C (ASCORBIC ACID) 500 MG tablet Take 500 mg by mouth daily.  . cyanocobalamin (,VITAMIN B-12,) 1000 MCG/ML injection Inject 1,000 mcg into the muscle every 30 (thirty) days.   . [DISCONTINUED] metoprolol (LOPRESSOR) 50 MG tablet TAKE 1 TABLET (50 MG TOTAL) BY MOUTH 2 (TWO) TIMES DAILY.   No facility-administered encounter medications on file as of 07/29/2019.    Allergy:  Allergies  Allergen Reactions  . Ace Inhibitors  Cough  . Ciprocin-Fluocin-Procin [Fluocinolone] Hives  . Norvasc [Amlodipine] Cough  . Latex Rash  . Neosporin [Neomycin-Bacitracin Zn-Polymyx] Rash  . Sulfa Antibiotics Rash    Social Hx:   Social History   Socioeconomic History  . Marital status: Married    Spouse name: Charlotte Crumb  . Number of children: 4  . Years of education: Not on file  . Highest education level: Some college, no degree  Occupational History  . Occupation: retired  Tobacco Use  . Smoking status: Never Smoker  . Smokeless tobacco: Never Used  Substance and Sexual Activity  . Alcohol use: No  . Drug use: No  . Sexual activity: Yes    Birth control/protection: Post-menopausal  Other Topics Concern  . Not on file  Social History Narrative  . Not on file   Social Determinants of Health   Financial  Resource Strain: Low Risk   . Difficulty of Paying Living Expenses: Not hard at all  Food Insecurity: No Food Insecurity  . Worried About Charity fundraiser in the Last Year: Never true  . Ran Out of Food in the Last Year: Never true  Transportation Needs:   . Lack of Transportation (Medical): Not on file  . Lack of Transportation (Non-Medical): Not on file  Physical Activity: Inactive  . Days of Exercise per Week: 0 days  . Minutes of Exercise per Session: 0 min  Stress: No Stress Concern Present  . Feeling of Stress : Not at all  Social Connections: Somewhat Isolated  . Frequency of Communication with Friends and Family: More than three times a week  . Frequency of Social Gatherings with Friends and Family: More than three times a week  . Attends Religious Services: Never  . Active Member of Clubs or Organizations: No  . Attends Archivist Meetings: Never  . Marital Status: Married  Human resources officer Violence: Unknown  . Fear of Current or Ex-Partner: No  . Emotionally Abused: No  . Physically Abused: No  . Sexually Abused: Not on file    Past Surgical Hx:  Past Surgical History:  Procedure Laterality Date  . BREAST BIOPSY Left 2009   benign  . CATARACT EXTRACTION W/ INTRAOCULAR LENS IMPLANT Left 2018  . CATARACT EXTRACTION W/PHACO Right 03/14/2014   Procedure: CATARACT EXTRACTION PHACO AND INTRAOCULAR LENS PLACEMENT (IOC);  Surgeon: Tonny Branch, MD;  Location: [facility];  Service: Ophthalmology;  Laterality: Right;  CDE: 8.34  . FOOT GANGLION EXCISION Right 2013  . HYSTEROSCOPY WITH D & C N/A 06/13/2017   Procedure: DILATATION AND CURETTAGE /HYSTEROSCOPY;  Surgeon: Alden Hipp, MD;  Location: [place] [facility];  Service: Gynecology;  Laterality: N/A;  . IR FLUORO GUIDE PORT INSERTION RIGHT  08/27/2017  . IR REMOVAL TUN ACCESS W/ PORT W/O FL MOD SED  04/27/2018  . IR US GUIDE VASC ACCESS RIGHT  08/27/2017  . LAPAROSCOPIC CHOLECYSTECTOMY  2005  . ROBOTIC ASSISTED TOTAL  HYSTERECTOMY WITH BILATERAL SALPINGO OOPHERECTOMY Bilateral 08/05/2017   Procedure: ROBOTIC ASSISTED TOTAL HYSTERECTOMY WITH BILATERAL SALPINGO OOPHORECTOMY;  Surgeon: Everitt Amber, MD;  Location: [facility];  Service: Gynecology;  Laterality: Bilateral;  . SENTINEL NODE BIOPSY Bilateral 08/05/2017   Procedure: SENTINEL NODE BIOPSY;  Surgeon: Everitt Amber, MD;  Location: [facility];  Service: Gynecology;  Laterality: Bilateral;  . UMBILICAL HERNIA REPAIR N/A 08/05/2017   Procedure: HERNIA REPAIR UMBILICAL ADULT;  Surgeon: Everitt Amber, MD;  Location: [facility];  Service: Gynecology;  Laterality: N/A;  . VEIN SURGERY Right 2011  vein stripping-leg  and laser treatment    Past Medical Hx:  Past Medical History:  Diagnosis Date  . Cataract   . Endometrial adenocarcinoma ([place])   . Environmental and seasonal allergies   . Family history of ovarian cancer   . Family history of pancreatic cancer   . Genetic testing 09/19/2017   Multi-Cancer panel (83 genes) @ [facility] - No pathogenic mutations detected  . GERD (gastroesophageal reflux disease)   . History of DVT of lower extremity    early 2000s--- lower right leg  . Hyperlipidemia   . Hypertension   . Hypothyroidism   . Type 2 diabetes mellitus ([place])    followed by pcp  . Umbilical hernia   . Vitamin B 12 deficiency   . Vitamin D deficiency   . Wears glasses     Past Gynecological History:  SVD x 4 No LMP recorded. Patient has had a hysterectomy.  Family Hx:  Family History  Problem Relation Age of Onset  . Pancreatic cancer Mother 22       deceased 65  . Ovarian cancer Mother        dx 76s  . Cancer Other 18       maternal half-sister; unk. type    Review of Systems:  Constitutional  Feels well,    ENT Normal appearing ears and nares bilaterally Skin/Breast  No rash, sores, jaundice, itching, dryness Cardiovascular  No chest pain, shortness of breath, or edema  Pulmonary  No cough or wheeze.  Gastro Intestinal  No nausea,  vomitting, or diarrhoea. No bright red blood per rectum, no abdominal pain, change in bowel movement, or constipation.  Genito Urinary  No frequency, urgency, dysuria,no bleeding Musculo Skeletal  No myalgia, arthralgia, joint swelling or pain  Neurologic  No weakness, numbness, change in gait,  Psychology  No depression, anxiety, insomnia.   Vitals:  Blood pressure 125/63, pulse 70, resp. rate 18, height _0  (1.549 m), weight 178 lb 12.8 oz (81.1 kg), SpO2 99 %.  Physical Exam: WD in NAD Neck  Supple NROM, without any enlargements.  Lymph Node Survey No cervical supraclavicular or inguinal adenopathy Cardiovascular  Pulse normal rate, regularity and rhythm. S1 and S2 normal.  Lungs  Clear to auscultation bilateraly, without wheezes/crackles/rhonchi. Good air movement.  Skin  No rash/lesions/breakdown  Psychiatry  Alert and oriented to person, place, and time  Abdomen  Normoactive bowel sounds, abdomen soft, non-tender and obese without hernia. Soft healed incisions.  Back No CVA tenderness Genito Urinary  Vulva/vagina: vaginal cuff smoothe, no mucosal changes. No palpable masses. Rectal  deferred Extremities  No bilateral cyanosis, clubbing or edema.   Thereasa Solo, MD  07/29/2019, 3:38 PM

## 2019-07-29 NOTE — Patient Instructions (Signed)
Please notify Dr Denman George at phone number [phone] if you notice vaginal bleeding, new pelvic or abdominal pains, bloating, feeling full easy, or a change in bladder or bowel function.   Please have Dr Clabe Seal office contact Dr Serita Grit office (at [phone]) in April to request an appointment with her for July, 2021.

## 2019-08-03 ENCOUNTER — Ambulatory Visit: Payer: Medicare Other | Attending: Orthopedic Surgery | Admitting: Physical Therapy

## 2019-08-03 ENCOUNTER — Other Ambulatory Visit: Payer: Self-pay

## 2019-08-03 ENCOUNTER — Encounter: Payer: Self-pay | Admitting: Physical Therapy

## 2019-08-03 DIAGNOSIS — M25511 Pain in right shoulder: Secondary | ICD-10-CM

## 2019-08-03 DIAGNOSIS — M25611 Stiffness of right shoulder, not elsewhere classified: Secondary | ICD-10-CM | POA: Insufficient documentation

## 2019-08-03 NOTE — Therapy (Signed)
[facility] [place], [place], [address] Phone: [phone]   Fax:  [phone]  Physical Therapy Evaluation  Patient Details  Name: Alexandria Irwin MRN: [id number] Date of Birth: 01-May-1946 Referring Provider (PT): Esmond Plants, MD   Encounter Date: 08/03/2019  PT End of Session - 08/03/19 1502    Visit Number  1    Number of Visits  12    Date for PT Re-Evaluation  09/07/19    Authorization Type  Progress note every 10th visit    PT Start Time  1300    PT Stop Time  1346    PT Time Calculation (min)  46 min    Equipment Utilized During Treatment  Other (comment)   right sling   Activity Tolerance  Patient tolerated treatment well    Behavior During Therapy  Anxious       Past Medical History:  Diagnosis Date  . Cataract   . Endometrial adenocarcinoma ([place])   . Environmental and seasonal allergies   . Family history of ovarian cancer   . Family history of pancreatic cancer   . Genetic testing 09/19/2017   Multi-Cancer panel (83 genes) @ [facility] - No pathogenic mutations detected  . GERD (gastroesophageal reflux disease)   . History of DVT of lower extremity    early 2000s--- lower right leg  . Hyperlipidemia   . Hypertension   . Hypothyroidism   . Type 2 diabetes mellitus ([place])    followed by pcp  . Umbilical hernia   . Vitamin B 12 deficiency   . Vitamin D deficiency   . Wears glasses     Past Surgical History:  Procedure Laterality Date  . BREAST BIOPSY Left 2009   benign  . CATARACT EXTRACTION W/ INTRAOCULAR LENS IMPLANT Left 2018  . CATARACT EXTRACTION W/PHACO Right 03/14/2014   Procedure: CATARACT EXTRACTION PHACO AND INTRAOCULAR LENS PLACEMENT (IOC);  Surgeon: Tonny Branch, MD;  Location: [facility];  Service: Ophthalmology;  Laterality: Right;  CDE: 8.34  . FOOT GANGLION EXCISION Right 2013  . HYSTEROSCOPY WITH D & C N/A 06/13/2017   Procedure: DILATATION AND CURETTAGE /HYSTEROSCOPY;  Surgeon: Alden Hipp, MD;  Location: [place] [facility];  Service: Gynecology;  Laterality: N/A;  . IR FLUORO GUIDE PORT INSERTION RIGHT  08/27/2017  . IR REMOVAL TUN ACCESS W/ PORT W/O FL MOD SED  04/27/2018  . IR US GUIDE VASC ACCESS RIGHT  08/27/2017  . LAPAROSCOPIC CHOLECYSTECTOMY  2005  . ROBOTIC ASSISTED TOTAL HYSTERECTOMY WITH BILATERAL SALPINGO OOPHERECTOMY Bilateral 08/05/2017   Procedure: ROBOTIC ASSISTED TOTAL HYSTERECTOMY WITH BILATERAL SALPINGO OOPHORECTOMY;  Surgeon: Everitt Amber, MD;  [facility];  Service: Gynecology;  Laterality: Bilateral;  . SENTINEL NODE BIOPSY Bilateral 08/05/2017   Procedure: SENTINEL NODE BIOPSY;  Surgeon: Everitt Amber, MD;  Location: [facility];  Service: Gynecology;  Laterality: Bilateral;  . UMBILICAL HERNIA REPAIR N/A 08/05/2017   Procedure: HERNIA REPAIR UMBILICAL ADULT;  Surgeon: Everitt Amber, MD;  [facility]: [facility];  Service: Gynecology;  Laterality: N/A;  . VEIN SURGERY Right 2011   vein stripping-leg  and laser treatment    There were no vitals filed for this visit.   Subjective Assessment - 08/03/19 1455    Subjective  COVID-19 screening performed upon arrival.Patient arrives to physical therapy with reports of right shoulder pain and loss of range of motion due to a right humeral head fracture on 07/02/2019. Patient reports routine healing per last follow up visit. Patient reports difficulties  with ADLs and dressing but has become more independent and gains assistance from family only when needed. Patient utilizes sling at home but reports she hasn't been out of much at home. Patient reports starting exercises provided by Dr. Veverly Fells but only performs 1x per day. Patient reports pain can reach a 10/10 at worst and pain at best is a 0/10 with rest. Patient's goals are to decrease pain, improve movement, improve strength, improve ability to perform home activities and "return to fully doing regular tasks."    Pertinent History  right humeral head fracture 07/02/2019 with routine healing  per patient report; HTN, DM    Limitations  Lifting;Writing;House hold activities    Diagnostic tests  x-ray: right humeral head fracture    Patient Stated Goals  "return to fully doing regular tasks"    Currently in Pain?  No/denies         [facility] PT Assessment - 08/03/19 0001      Assessment   Medical Diagnosis  Pain of right shoulder joint    Referring Provider (PT)  Esmond Plants, MD    Onset Date/Surgical Date  07/02/19    Hand Dominance  Right    Next MD Visit  08/25/2019    Prior Therapy  no      Precautions   Precautions  Other (comment)    Precaution Comments  SLOW PROGRESSION, PROM and AAROM to right shoulder      Restrictions   Weight Bearing Restrictions  No      Balance Screen   Has the patient fallen in the past 6 months  Yes    How many times?  1    Has the patient had a decrease in activity level because of a fear of falling?   No    Is the patient reluctant to leave their home because of a fear of falling?   No      Home Film/video editor residence    Living Arrangements  Spouse/significant other      Prior Function   Level of Independence  Independent with basic ADLs      ROM / Strength   AROM / PROM / Strength  PROM      PROM   Overall PROM   Deficits;Due to pain    Overall PROM Comments  right elbow extension -35    PROM Assessment Site  Shoulder    Right/Left Shoulder  Right    Right Shoulder Flexion  62 Degrees    Right Shoulder External Rotation  -22 Degrees      Palpation   Palpation comment  tenderness to right mid biceps region and distal biceps region. ongoing ecchymosis to right distal biceps       Transfers   Comments  slow transfers but independent.                 Objective measurements completed on examination: See above findings.      [facility] Adult PT Treatment/Exercise - 08/03/19 0001      Modalities   Modalities  Electrical Stimulation;Moist Heat      Moist Heat Therapy   Number Minutes  Moist Heat  10 Minutes    Moist Heat Location  Shoulder      Electrical Stimulation   Electrical Stimulation Location  right shoulder    Electrical Stimulation Action  IFC non-motoric    Electrical Stimulation Parameters  80-150 hz x10 mins    Electrical Stimulation Goals  Pain             PT Education - 08/03/19 1500    Education Details  AP and lateral pendulums, elbow extension, putty grip    Methods  Explanation;Demonstration;Handout    Comprehension  Verbalized understanding;Returned demonstration          PT Long Term Goals - 08/03/19 1504      PT LONG TERM GOAL #1   Title  Patient will be independent HEP    Time  4    Period  Weeks    Status  New      PT LONG TERM GOAL #2   Title  Patient will demonstrate 135+ degrees of right shoulder flexion AROM to improve ability to perform overhead tasks.    Time  4    Period  Weeks    Status  New      PT LONG TERM GOAL #3   Title  Patient will demonstrate 55+ degrees of right shoulder ER AROM to improve ability to don and doff apparel.    Time  4    Period  Weeks    Status  New      PT LONG TERM GOAL #4   Title  Patient will report ability to perform ADLs and home activities with right shoulder pain less than or equal to 3/10.    Time  4    Period  Weeks    Status  New             Plan - 08/03/19 1526    Clinical Impression Statement  Patient is a 74 year old right handed female who presents to physical therapy with right shoulder pain and decreased right shoulder PROM due to a right humeral head fracture sustained on 07/02/2019. Patient was anxious during PROM but was able to complete with minimal complaints of pain. Patient is independent with donning and doffing sling and is mod I for transfers from sit to supine. Patient and PT discussed importance of performing HEP more frequently but not past pain. Patient reported understanding. Patient would benefit from skilled physical therapy to address deficits and  address patient's goals.    Personal Factors and Comorbidities  Age;Comorbidity 2    Comorbidities  right humeral head fracture 07/02/2019, HTN, DM    Examination-Activity Limitations  Bathing;Dressing;Hygiene/Grooming;Lift;Carry    Examination-Participation Restrictions  Driving    Stability/Clinical Decision Making  Stable/Uncomplicated    Clinical Decision Making  Low    Rehab Potential  Good    PT Frequency  3x / week    PT Duration  4 weeks    PT Treatment/Interventions  ADLs/Self Care Home Management;Iontophoresis 4mg /ml Dexamethasone;Moist Heat;Electrical Stimulation;Cryotherapy;Therapeutic activities;Therapeutic exercise;Neuromuscular re-education;Manual techniques;Passive range of motion;Patient/family education    PT Next Visit Plan  Very slow progression. PROM and AAROM per MD referral, modalities PRN for pain relief.    PT Home Exercise Plan  see patient education section    Consulted and Agree with Plan of Care  Patient       Patient will benefit from skilled therapeutic intervention in order to improve the following deficits and impairments:  Decreased activity tolerance, Decreased strength, Decreased range of motion, Impaired UE functional use, Pain, Postural dysfunction  Visit Diagnosis: Acute pain of right shoulder  Stiffness of right shoulder, not elsewhere classified     Problem List Patient Active Problem List   Diagnosis Date Noted  . Pancytopenia, acquired ([place]) 12/16/2017  . Anemia due to antineoplastic chemotherapy 11/26/2017  .  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 Genetic testing 09/19/2017  . Family history of ovarian cancer   . Family history of pancreatic cancer   . Obesity (BMI 30-39.9) 08/26/2017  . Easy bruising 08/18/2017  . Endometrial cancer, FIGO stage IIIA ([place]) 08/12/2017  . Endometrial cancer ([place]) 08/05/2017  . B12 deficiency 06/26/2017  . Endometrial adenocarcinoma ([place]) 06/25/2017  . Umbilical hernia without  obstruction and without gangrene 06/25/2017  . Vitamin D deficiency   . Thrombophlebitis leg 09/18/2013  . Hypertension associated with diabetes ([place]) 12/03/2012  . Diabetes ([place]) 12/03/2012  . Hypothyroidism 12/03/2012  . Hyperlipidemia associated with type 2 diabetes mellitus ([place]) 12/03/2012    Gabriela Eves, PT, DPT 08/03/2019, 3:35 PM  [facility] [address] [place], [place], [address] Phone: [phone]   Fax:  [phone]  Name: Alexandria Irwin MRN: [id number] Date of Birth: [birth year]

## 2019-08-06 ENCOUNTER — Other Ambulatory Visit: Payer: Self-pay

## 2019-08-06 ENCOUNTER — Ambulatory Visit: Payer: Medicare Other | Admitting: Physical Therapy

## 2019-08-06 ENCOUNTER — Encounter: Payer: Self-pay | Admitting: Physical Therapy

## 2019-08-06 DIAGNOSIS — M25511 Pain in right shoulder: Secondary | ICD-10-CM | POA: Diagnosis not present

## 2019-08-06 DIAGNOSIS — M25611 Stiffness of right shoulder, not elsewhere classified: Secondary | ICD-10-CM | POA: Diagnosis not present

## 2019-08-06 NOTE — Therapy (Signed)
[facility] [place], [place], [address] Phone: [phone]   Fax:  [phone]  Physical Therapy Treatment  Patient Details  Name: Alexandria Irwin MRN: [id number] Date of Birth: Jul 25, 1945 Referring Provider (PT): Esmond Plants, MD   Encounter Date: 08/06/2019  PT End of Session - 08/06/19 [id number]    Visit Number  2    Number of Visits  12    Date for PT Re-Evaluation  09/07/19    Authorization Type  Progress note every 10th visit    PT Start Time  0945    PT Stop Time  1038    PT Time Calculation (min)  53 min    Equipment Utilized During Treatment  --   right sling   Activity Tolerance  Patient tolerated treatment well    Behavior During Therapy  Anxious       Past Medical History:  Diagnosis Date  . Cataract   . Endometrial adenocarcinoma ([place])   . Environmental and seasonal allergies   . Family history of ovarian cancer   . Family history of pancreatic cancer   . Genetic testing 09/19/2017   Multi-Cancer panel (83 genes) @ [facility] - No pathogenic mutations detected  . GERD (gastroesophageal reflux disease)   . History of DVT of lower extremity    early 2000s--- lower right leg  . Hyperlipidemia   . Hypertension   . Hypothyroidism   . Type 2 diabetes mellitus ([place])    followed by pcp  . Umbilical hernia   . Vitamin B 12 deficiency   . Vitamin D deficiency   . Wears glasses     Past Surgical History:  Procedure Laterality Date  . BREAST BIOPSY Left 2009   benign  . CATARACT EXTRACTION W/ INTRAOCULAR LENS IMPLANT Left 2018  . CATARACT EXTRACTION W/PHACO Right 03/14/2014   Procedure: CATARACT EXTRACTION PHACO AND INTRAOCULAR LENS PLACEMENT (IOC);  Surgeon: Tonny Branch, MD;  [facility]: [facility];  Service: Ophthalmology;  Laterality: Right;  CDE: 8.34  . FOOT GANGLION EXCISION Right 2013  . HYSTEROSCOPY WITH D & C N/A 06/13/2017   Procedure: DILATATION AND CURETTAGE /HYSTEROSCOPY;  Surgeon: Alden Hipp, MD;   Location: [facility];  Service: Gynecology;  Laterality: N/A;  . IR FLUORO GUIDE PORT INSERTION RIGHT  08/27/2017  . IR REMOVAL TUN ACCESS W/ PORT W/O FL MOD SED  04/27/2018  . IR US GUIDE VASC ACCESS RIGHT  08/27/2017  . LAPAROSCOPIC CHOLECYSTECTOMY  2005  . ROBOTIC ASSISTED TOTAL HYSTERECTOMY WITH BILATERAL SALPINGO OOPHERECTOMY Bilateral 08/05/2017   Procedure: ROBOTIC ASSISTED TOTAL HYSTERECTOMY WITH BILATERAL SALPINGO OOPHORECTOMY;  Surgeon: Everitt Amber, MD;  [facility];  Service: Gynecology;  Laterality: Bilateral;  . SENTINEL NODE BIOPSY Bilateral 08/05/2017   Procedure: SENTINEL NODE BIOPSY;  Surgeon: Everitt Amber, MD;  [facility];  Service: Gynecology;  Laterality: Bilateral;  . UMBILICAL HERNIA REPAIR N/A 08/05/2017   Procedure: HERNIA REPAIR UMBILICAL ADULT;  Surgeon: Everitt Amber, MD;  [facility];  Service: Gynecology;  Laterality: N/A;  . VEIN SURGERY Right 2011   vein stripping-leg  and laser treatment    There were no vitals filed for this visit.  Subjective Assessment - 08/06/19 1241    Subjective  COVID-19 screening performed upon arrival. Patient arrives feeling better. Has been compliant with HEP but hasn't gotten her arm out of the sling that much.    Pertinent History  right humeral head fracture 07/02/2019 with routine healing per patient report; HTN, DM  Limitations  Lifting;Writing;House hold activities    Diagnostic tests  x-ray: right humeral head fracture    Currently in Pain?  Yes   "little bit"        [facility] PT Assessment - 08/06/19 0001      Assessment   Medical Diagnosis  Pain of right shoulder joint    Referring Provider (PT)  Esmond Plants, MD    Onset Date/Surgical Date  07/02/19    Hand Dominance  Right    Next MD Visit  08/25/2019    Prior Therapy  no      Precautions   Precautions  Other (comment)    Precaution Comments  SLOW PROGRESSION, PROM and AAROM to right shoulder                   [facility] Adult PT Treatment/Exercise -  08/06/19 0001      Modalities   Modalities  Electrical Stimulation;Moist Heat      Moist Heat Therapy   Number Minutes Moist Heat  15 Minutes    Moist Heat Location  Shoulder      Electrical Stimulation   Electrical Stimulation Location  right shoulder    Electrical Stimulation Action  IFC non-motoric    Electrical Stimulation Parameters  80-150 hz x15 mins    Electrical Stimulation Goals  Pain      Manual Therapy   Manual Therapy  Passive ROM    Passive ROM  very slow cautious PROM into flexion, abd and ER of right shoulder. Right elbow extension PROM to improve Range                  PT Long Term Goals - 08/03/19 1504      PT LONG TERM GOAL #1   Title  Patient will be independent HEP    Time  4    Period  Weeks    Status  New      PT LONG TERM GOAL #2   Title  Patient will demonstrate 135+ degrees of right shoulder flexion AROM to improve ability to perform overhead tasks.    Time  4    Period  Weeks    Status  New      PT LONG TERM GOAL #3   Title  Patient will demonstrate 55+ degrees of right shoulder ER AROM to improve ability to don and doff apparel.    Time  4    Period  Weeks    Status  New      PT LONG TERM GOAL #4   Title  Patient will report ability to perform ADLs and home activities with right shoulder pain less than or equal to 3/10.    Time  4    Period  Weeks    Status  New            Plan - 08/06/19 1243    Clinical Impression Statement  Patient arrives feeling a little bit of pain in the right upper arm. Very slow cautions PROM of right shoulder performed to right shoulder per referral. Patient noted with intermittent guarding particularly when going towards her available end range of ER. Oscillations provided to promote muscle relaxation and reduce guarding. Patient's FOTO limitation at 80%. No adverse affects upon removal of modalities.    Personal Factors and Comorbidities  Age;Comorbidity 2    Comorbidities  right humeral head  fracture 07/02/2019, HTN, DM    Examination-Activity Limitations  Bathing;Dressing;Hygiene/Grooming;Lift;Carry    Examination-Participation Restrictions  Driving    Stability/Clinical Decision Making  Stable/Uncomplicated    Clinical Decision Making  Low    Rehab Potential  Good    PT Frequency  3x / week    PT Duration  4 weeks    PT Treatment/Interventions  ADLs/Self Care Home Management;Iontophoresis 4mg /ml Dexamethasone;Moist Heat;Electrical Stimulation;Cryotherapy;Therapeutic activities;Therapeutic exercise;Neuromuscular re-education;Manual techniques;Passive range of motion;Patient/family education    PT Next Visit Plan  Very slow progression. PROM and AAROM per MD referral, modalities PRN for pain relief.    PT Home Exercise Plan  see patient education section    Consulted and Agree with Plan of Care  Patient       Patient will benefit from skilled therapeutic intervention in order to improve the following deficits and impairments:  Decreased activity tolerance, Decreased strength, Decreased range of motion, Impaired UE functional use, Pain, Postural dysfunction  Visit Diagnosis: Acute pain of right shoulder  Stiffness of right shoulder, not elsewhere classified     Problem List Patient Active Problem List   Diagnosis Date Noted  . Pancytopenia, acquired ([place]) 12/16/2017  . Anemia due to antineoplastic chemotherapy 11/26/2017  . Peripheral neuropathy due to chemotherapy ([place]) 10/14/2017  . Genetic testing 09/19/2017  . Family history of ovarian cancer   . Family history of pancreatic cancer   . Obesity (BMI 30-39.9) 08/26/2017  . Easy bruising 08/18/2017  . Endometrial cancer, FIGO stage IIIA ([place]) 08/12/2017  . Endometrial cancer ([place]) 08/05/2017  . B12 deficiency 06/26/2017  . Endometrial adenocarcinoma ([place]) 06/25/2017  . Umbilical hernia without obstruction and without gangrene 06/25/2017  . Vitamin D deficiency   . Thrombophlebitis leg 09/18/2013  . Hypertension  associated with diabetes ([place]) 12/03/2012  . Diabetes ([place]) 12/03/2012  . Hypothyroidism 12/03/2012  . Hyperlipidemia associated with type 2 diabetes mellitus ([place]) 12/03/2012    Gabriela Eves, PT, DPT 08/06/2019, 12:54 PM  [facility] [address] [place], [place], [address] Phone: [phone]   Fax:  [phone]  Name: SUANN EGGLETON MRN: [id number] Date of Birth: [birth year]

## 2019-08-07 ENCOUNTER — Ambulatory Visit: Payer: Medicare Other | Attending: Internal Medicine

## 2019-08-07 DIAGNOSIS — Z23 Encounter for immunization: Secondary | ICD-10-CM | POA: Insufficient documentation

## 2019-08-07 NOTE — Progress Notes (Signed)
   Covid-19 Vaccination Clinic  Name:  Alexandria Irwin    MRN: [id number] DOB: April 19, 1946  08/07/2019  [name] was observed post Covid-19 immunization for 15 minutes without incidence. She was provided with Vaccine Information Sheet and instruction to access the V-Safe system.   [name] was instructed to call 911 with any severe reactions post vaccine: Marland Kitchen Difficulty breathing  . Swelling of your face and throat  . A fast heartbeat  . A bad rash all over your body  . Dizziness and weakness    Immunizations Administered    Name Date Dose VIS Date Route   Pfizer COVID-19 Vaccine 08/07/2019  8:55 AM 0.3 mL 06/11/2019 Intramuscular   Manufacturer: [place]   Lot: [id number]   [place]ields: [id number]

## 2019-08-10 ENCOUNTER — Other Ambulatory Visit: Payer: Self-pay

## 2019-08-10 ENCOUNTER — Ambulatory Visit: Payer: Medicare Other | Admitting: Physical Therapy

## 2019-08-10 DIAGNOSIS — M25511 Pain in right shoulder: Secondary | ICD-10-CM | POA: Diagnosis not present

## 2019-08-10 DIAGNOSIS — M25611 Stiffness of right shoulder, not elsewhere classified: Secondary | ICD-10-CM

## 2019-08-10 NOTE — Therapy (Signed)
[facility] [place], [place], [address] Phone: [phone]   Fax:  [phone]  Physical Therapy Treatment  Patient Details  Name: Alexandria Irwin MRN: [id number] Date of Birth: 07/06/1945 Referring Provider (PT): Esmond Plants, MD   Encounter Date: 08/10/2019  PT End of Session - 08/10/19 1159    Visit Number  3    Number of Visits  12    Date for PT Re-Evaluation  09/07/19    Authorization Type  Progress note every 10th visit    PT Start Time  0947    PT Stop Time  1032    PT Time Calculation (min)  45 min    Activity Tolerance  Patient tolerated treatment well    Behavior During Therapy  Anxious       Past Medical History:  Diagnosis Date  . Cataract   . Endometrial adenocarcinoma ([place])   . Environmental and seasonal allergies   . Family history of ovarian cancer   . Family history of pancreatic cancer   . Genetic testing 09/19/2017   Multi-Cancer panel (83 genes) @ [facility] - No pathogenic mutations detected  . GERD (gastroesophageal reflux disease)   . History of DVT of lower extremity    early 2000s--- lower right leg  . Hyperlipidemia   . Hypertension   . Hypothyroidism   . Type 2 diabetes mellitus ([place])    followed by pcp  . Umbilical hernia   . Vitamin B 12 deficiency   . Vitamin D deficiency   . Wears glasses     Past Surgical History:  Procedure Laterality Date  . BREAST BIOPSY Left 2009   benign  . CATARACT EXTRACTION W/ INTRAOCULAR LENS IMPLANT Left 2018  . CATARACT EXTRACTION W/PHACO Right 03/14/2014   Procedure: CATARACT EXTRACTION PHACO AND INTRAOCULAR LENS PLACEMENT (IOC);  Surgeon: Tonny Branch, MD;  Location: [facility];  Service: Ophthalmology;  Laterality: Right;  CDE: 8.34  . FOOT GANGLION EXCISION Right 2013  . HYSTEROSCOPY WITH D & C N/A 06/13/2017   Procedure: DILATATION AND CURETTAGE /HYSTEROSCOPY;  Surgeon: Alden Hipp, MD;  Location: [facility];  Service: Gynecology;  Laterality: N/A;   . IR FLUORO GUIDE PORT INSERTION RIGHT  08/27/2017  . IR REMOVAL TUN ACCESS W/ PORT W/O FL MOD SED  04/27/2018  . IR US GUIDE VASC ACCESS RIGHT  08/27/2017  . LAPAROSCOPIC CHOLECYSTECTOMY  2005  . ROBOTIC ASSISTED TOTAL HYSTERECTOMY WITH BILATERAL SALPINGO OOPHERECTOMY Bilateral 08/05/2017   Procedure: ROBOTIC ASSISTED TOTAL HYSTERECTOMY WITH BILATERAL SALPINGO OOPHORECTOMY;  Surgeon: Everitt Amber, MD;  [facility];  Service: Gynecology;  Laterality: Bilateral;  . SENTINEL NODE BIOPSY Bilateral 08/05/2017   Procedure: SENTINEL NODE BIOPSY;  Surgeon: Everitt Amber, MD;  [facility];  Service: Gynecology;  Laterality: Bilateral;  . UMBILICAL HERNIA REPAIR N/A 08/05/2017   Procedure: HERNIA REPAIR UMBILICAL ADULT;  Surgeon: Everitt Amber, MD;  [facility];  Service: Gynecology;  Laterality: N/A;  . VEIN SURGERY Right 2011   vein stripping-leg  and laser treatment    There were no vitals filed for this visit.  Subjective Assessment - 08/10/19 1159    Subjective  COVID-19 screen performed prior to patient entering clinic.  I'm doing better than when I started.    Pertinent History  right humeral head fracture 07/02/2019 with routine healing per patient report; HTN, DM    Limitations  Lifting;Writing;House hold activities    Diagnostic tests  x-ray: right humeral head fracture  Patient Stated Goals  "return to fully doing regular tasks"    Currently in Pain?  No/denies   No pain reported at rest.                      [facility] Adult PT Treatment/Exercise - 08/10/19 0001      Electrical Stimulation   Electrical Stimulation Location  Right shoulder.    Electrical Stimulation Action  IFC (non-motoric)    Electrical Stimulation Parameters  80-150 Hz x 10 minutes.    Electrical Stimulation Goals  Pain      Manual Therapy   Manual Therapy  Passive ROM    Passive ROM  In supine:  Gentle PAROM to patient's left elbow and shoulder x 23 minutes.                  PT  Long Term Goals - 08/03/19 1504      PT LONG TERM GOAL #1   Title  Patient will be independent HEP    Time  4    Period  Weeks    Status  New      PT LONG TERM GOAL #2   Title  Patient will demonstrate 135+ degrees of right shoulder flexion AROM to improve ability to perform overhead tasks.    Time  4    Period  Weeks    Status  New      PT LONG TERM GOAL #3   Title  Patient will demonstrate 55+ degrees of right shoulder ER AROM to improve ability to don and doff apparel.    Time  4    Period  Weeks    Status  New      PT LONG TERM GOAL #4   Title  Patient will report ability to perform ADLs and home activities with right shoulder pain less than or equal to 3/10.    Time  4    Period  Weeks    Status  New            Plan - 08/10/19 1203    Clinical Impression Statement  Patient did well with gentle range of motion to her right elbow and shoulder.  She states she is already see improvment since beginning PT.    Personal Factors and Comorbidities  Age;Comorbidity 2    Comorbidities  right humeral head fracture 07/02/2019, HTN, DM    Examination-Activity Limitations  Bathing;Dressing;Hygiene/Grooming;Lift;Carry    Examination-Participation Restrictions  Driving    Stability/Clinical Decision Making  Stable/Uncomplicated    Rehab Potential  Good    PT Frequency  3x / week    PT Duration  4 weeks    PT Treatment/Interventions  ADLs/Self Care Home Management;Iontophoresis 4mg /ml Dexamethasone;Moist Heat;Electrical Stimulation;Cryotherapy;Therapeutic activities;Therapeutic exercise;Neuromuscular re-education;Manual techniques;Passive range of motion;Patient/family education    PT Next Visit Plan  Very slow progression. PROM and AAROM per MD referral, modalities PRN for pain relief.    PT Home Exercise Plan  see patient education section    Consulted and Agree with Plan of Care  Patient       Patient will benefit from skilled therapeutic intervention in order to improve the  following deficits and impairments:  Decreased activity tolerance, Decreased strength, Decreased range of motion, Impaired UE functional use, Pain, Postural dysfunction  Visit Diagnosis: Acute pain of right shoulder  Stiffness of right shoulder, not elsewhere classified     Problem List Patient Active Problem List   Diagnosis Date Noted  .  Pancytopenia, acquired ([place]) 12/16/2017  . Anemia due to antineoplastic chemotherapy 11/26/2017  . Peripheral neuropathy due to chemotherapy ([place]) 10/14/2017  . Genetic testing 09/19/2017  . Family history of ovarian cancer   . Family history of pancreatic cancer   . Obesity (BMI 30-39.9) 08/26/2017  . Easy bruising 08/18/2017  . Endometrial cancer, FIGO stage IIIA ([place]) 08/12/2017  . Endometrial cancer ([place]) 08/05/2017  . B12 deficiency 06/26/2017  . Endometrial adenocarcinoma ([place]) 06/25/2017  . Umbilical hernia without obstruction and without gangrene 06/25/2017  . Vitamin D deficiency   . Thrombophlebitis leg 09/18/2013  . Hypertension associated with diabetes ([place]) 12/03/2012  . Diabetes ([place]) 12/03/2012  . Hypothyroidism 12/03/2012  . Hyperlipidemia associated with type 2 diabetes mellitus ([place]) 12/03/2012    Jazzlynn Rawe, [place] MPT 08/10/2019, 12:05 PM  [facility] [address] [place], [place], [address] Phone: [phone]   Fax:  [phone]  Name: Alexandria Irwin MRN: [id number] Date of Birth: 09/18/1945

## 2019-08-12 ENCOUNTER — Other Ambulatory Visit: Payer: Self-pay

## 2019-08-12 ENCOUNTER — Encounter: Payer: Self-pay | Admitting: Physical Therapy

## 2019-08-12 ENCOUNTER — Ambulatory Visit: Payer: Medicare Other | Admitting: Physical Therapy

## 2019-08-12 DIAGNOSIS — M25611 Stiffness of right shoulder, not elsewhere classified: Secondary | ICD-10-CM

## 2019-08-12 DIAGNOSIS — M25511 Pain in right shoulder: Secondary | ICD-10-CM

## 2019-08-12 NOTE — Therapy (Signed)
[facility] [place], [place], [address] Phone: [phone]   Fax:  [phone]  Physical Therapy Treatment  Patient Details  Name: Alexandria Irwin MRN: [id number] Date of Birth: [birth year] Referring Provider (PT): Esmond Plants, MD   Encounter Date: 08/12/2019  PT End of Session - 08/12/19 1013    Visit Number  4    Number of Visits  12    Date for PT Re-Evaluation  09/07/19    Authorization Type  Progress note every 10th visit    PT Start Time  0945    PT Stop Time  1025    PT Time Calculation (min)  40 min    Activity Tolerance  Patient tolerated treatment well    Behavior During Therapy  [facility] for tasks assessed/performed       Past Medical History:  Diagnosis Date  . Cataract   . Endometrial adenocarcinoma ([place])   . Environmental and seasonal allergies   . Family history of ovarian cancer   . Family history of pancreatic cancer   . Genetic testing 09/19/2017   Multi-Cancer panel (83 genes) @ [facility] - No pathogenic mutations detected  . GERD (gastroesophageal reflux disease)   . History of DVT of lower extremity    early 2000s--- lower right leg  . Hyperlipidemia   . Hypertension   . Hypothyroidism   . Type 2 diabetes mellitus ([place])    followed by pcp  . Umbilical hernia   . Vitamin B 12 deficiency   . Vitamin D deficiency   . Wears glasses     Past Surgical History:  Procedure Laterality Date  . BREAST BIOPSY Left 2009   benign  . CATARACT EXTRACTION W/ INTRAOCULAR LENS IMPLANT Left 2018  . CATARACT EXTRACTION W/PHACO Right 03/14/2014   Procedure: CATARACT EXTRACTION PHACO AND INTRAOCULAR LENS PLACEMENT (IOC);  Surgeon: Tonny Branch, MD;  [facility];  Service: Ophthalmology;  Laterality: Right;  CDE: 8.34  . FOOT GANGLION EXCISION Right 2013  . HYSTEROSCOPY WITH D & C N/A 06/13/2017   Procedure: DILATATION AND CURETTAGE /HYSTEROSCOPY;  Surgeon: Alden Hipp, MD;  Location: [facility];  Service:  Gynecology;  Laterality: N/A;  . IR FLUORO GUIDE PORT INSERTION RIGHT  08/27/2017  . IR REMOVAL TUN ACCESS W/ PORT W/O FL MOD SED  04/27/2018  . IR US GUIDE VASC ACCESS RIGHT  08/27/2017  . LAPAROSCOPIC CHOLECYSTECTOMY  2005  . ROBOTIC ASSISTED TOTAL HYSTERECTOMY WITH BILATERAL SALPINGO OOPHERECTOMY Bilateral 08/05/2017   Procedure: ROBOTIC ASSISTED TOTAL HYSTERECTOMY WITH BILATERAL SALPINGO OOPHORECTOMY;  Surgeon: Everitt Amber, MD;  [facility];  Service: Gynecology;  Laterality: Bilateral;  . SENTINEL NODE BIOPSY Bilateral 08/05/2017   Procedure: SENTINEL NODE BIOPSY;  Surgeon: Everitt Amber, MD;  [facility];  Service: Gynecology;  Laterality: Bilateral;  . UMBILICAL HERNIA REPAIR N/A 08/05/2017   Procedure: HERNIA REPAIR UMBILICAL ADULT;  Surgeon: Everitt Amber, MD;  [facility];  Service: Gynecology;  Laterality: N/A;  . VEIN SURGERY Right 2011   vein stripping-leg  and laser treatment    There were no vitals filed for this visit.  Subjective Assessment - 08/12/19 0952    Subjective  COVID-19 screen performed prior to patient entering clinic.  Patient arrived with no new complaints.    Pertinent History  right humeral head fracture 07/02/2019 with routine healing per patient report; HTN, DM    Limitations  Lifting;Writing;House hold activities    Diagnostic tests  x-ray: right humeral head fracture  Patient Stated Goals  "return to fully doing regular tasks"    Currently in Pain?  No/denies   no pain at rest        [facility] PT Assessment - 08/12/19 0001      PROM   PROM Assessment Site  Shoulder    Right/Left Shoulder  Right    Right Shoulder Flexion  75 Degrees    Right Shoulder External Rotation  23 Degrees                   [facility] Adult PT Treatment/Exercise - 08/12/19 0001      Moist Heat Therapy   Number Minutes Moist Heat  15 Minutes    Moist Heat Location  Shoulder      Electrical Stimulation   Electrical Stimulation Location  Right shoulder.     Electrical Stimulation Action  IFC non motoric    Electrical Stimulation Parameters  80-150hz  x25min    Electrical Stimulation Goals  Pain      Manual Therapy   Manual Therapy  Passive ROM    Passive ROM  In supine:  Gentle PAROM to patient's left elbow and shoulder  flexion, ER, IR to improve mobility                  PT Long Term Goals - 08/12/19 1014      PT LONG TERM GOAL #1   Title  Patient will be independent HEP    Time  4    Period  Weeks    Status  On-going      PT LONG TERM GOAL #2   Title  Patient will demonstrate 135+ degrees of right shoulder flexion AROM to improve ability to perform overhead tasks.    Time  4    Period  Weeks    Status  On-going      PT LONG TERM GOAL #3   Title  Patient will demonstrate 55+ degrees of right shoulder ER AROM to improve ability to don and doff apparel.    Time  4    Period  Weeks    Status  On-going      PT LONG TERM GOAL #4   Title  Patient will report ability to perform ADLs and home activities with right shoulder pain less than or equal to 3/10.    Time  4    Period  Weeks    Status  On-going            Plan - 08/12/19 1015    Clinical Impression Statement  Patient tolerated treatment well today. Patient has reported no pain at rest and up to [id number] with certain movements. Patient has been doing exercises at home as instructed by PT. Patients ROM continues to improve. Today focused on gentle PAAROM for all shoulder and elbow movements with slow gentle range to improve mobility. Patients current goals ongoing at this time.    Personal Factors and Comorbidities  Age;Comorbidity 2    Comorbidities  right humeral head fracture 07/02/2019, HTN, DM    Examination-Activity Limitations  Bathing;Dressing;Hygiene/Grooming;Lift;Carry    Stability/Clinical Decision Making  Stable/Uncomplicated    Rehab Potential  Good    PT Frequency  3x / week    PT Duration  4 weeks    PT Treatment/Interventions  ADLs/Self Care Home  Management;Iontophoresis 4mg /ml Dexamethasone;Moist Heat;Electrical Stimulation;Cryotherapy;Therapeutic activities;Therapeutic exercise;Neuromuscular re-education;Manual techniques;Passive range of motion;Patient/family education    PT Next Visit Plan  cont with POC for Very slow progression.  PROM and AAROM per MD referral, modalities PRN for pain relief.    Consulted and Agree with Plan of Care  Patient       Patient will benefit from skilled therapeutic intervention in order to improve the following deficits and impairments:  Decreased activity tolerance, Decreased strength, Decreased range of motion, Impaired UE functional use, Pain, Postural dysfunction  Visit Diagnosis: Acute pain of right shoulder  Stiffness of right shoulder, not elsewhere classified     Problem List Patient Active Problem List   Diagnosis Date Noted  . Pancytopenia, acquired ([place]) 12/16/2017  . Anemia due to antineoplastic chemotherapy 11/26/2017  . Peripheral neuropathy due to chemotherapy ([place]) 10/14/2017  . Genetic testing 09/19/2017  . Family history of ovarian cancer   . Family history of pancreatic cancer   . Obesity (BMI 30-39.9) 08/26/2017  . Easy bruising 08/18/2017  . Endometrial cancer, FIGO stage IIIA ([place]) 08/12/2017  . Endometrial cancer ([place]) 08/05/2017  . B12 deficiency 06/26/2017  . Endometrial adenocarcinoma ([place]) 06/25/2017  . Umbilical hernia without obstruction and without gangrene 06/25/2017  . Vitamin D deficiency   . Thrombophlebitis leg 09/18/2013  . Hypertension associated with diabetes ([place]) 12/03/2012  . Diabetes ([place]) 12/03/2012  . Hypothyroidism 12/03/2012  . Hyperlipidemia associated with type 2 diabetes mellitus ([place]) 12/03/2012    Freyja Govea P, PTA 08/12/2019, 10:30 AM  [facility] [place], [place], [address] Phone: [phone]   Fax:  [phone]  Name: Alexandria Irwin MRN: [id number] Date of  Birth: 09/21/1945

## 2019-08-17 ENCOUNTER — Other Ambulatory Visit: Payer: Self-pay

## 2019-08-17 ENCOUNTER — Ambulatory Visit: Payer: Medicare Other | Admitting: Physical Therapy

## 2019-08-17 DIAGNOSIS — M25611 Stiffness of right shoulder, not elsewhere classified: Secondary | ICD-10-CM | POA: Diagnosis not present

## 2019-08-17 DIAGNOSIS — M25511 Pain in right shoulder: Secondary | ICD-10-CM | POA: Diagnosis not present

## 2019-08-17 NOTE — Therapy (Signed)
[facility] [place], [place], [address] Phone: [phone]   Fax:  [phone]  Physical Therapy Treatment  Patient Details  Name: Alexandria Irwin MRN: [id number] Date of Birth: October 10, 1945 Referring Provider (PT): Esmond Plants, MD   Encounter Date: 08/17/2019  PT End of Session - 08/17/19 1421    Visit Number  5    Number of Visits  12    Date for PT Re-Evaluation  09/07/19    Authorization Type  Progress note every 10th visit    PT Start Time  0145    PT Stop Time  0230    PT Time Calculation (min)  45 min    Activity Tolerance  Patient tolerated treatment well    Behavior During Therapy  [facility] for tasks assessed/performed       Past Medical History:  Diagnosis Date  . Cataract   . Endometrial adenocarcinoma ([place])   . Environmental and seasonal allergies   . Family history of ovarian cancer   . Family history of pancreatic cancer   . Genetic testing 09/19/2017   Multi-Cancer panel (83 genes) @ [facility] - No pathogenic mutations detected  . GERD (gastroesophageal reflux disease)   . History of DVT of lower extremity    early 2000s--- lower right leg  . Hyperlipidemia   . Hypertension   . Hypothyroidism   . Type 2 diabetes mellitus ([place])    followed by pcp  . Umbilical hernia   . Vitamin B 12 deficiency   . Vitamin D deficiency   . Wears glasses     Past Surgical History:  Procedure Laterality Date  . BREAST BIOPSY Left 2009   benign  . CATARACT EXTRACTION W/ INTRAOCULAR LENS IMPLANT Left 2018  . CATARACT EXTRACTION W/PHACO Right 03/14/2014   Procedure: CATARACT EXTRACTION PHACO AND INTRAOCULAR LENS PLACEMENT (IOC);  Surgeon: Tonny Branch, MD;  Location: [facility];  Service: Ophthalmology;  Laterality: Right;  CDE: 8.34  . FOOT GANGLION EXCISION Right 2013  . HYSTEROSCOPY WITH D & C N/A 06/13/2017   Procedure: DILATATION AND CURETTAGE /HYSTEROSCOPY;  Surgeon: Alden Hipp, MD;  Location: [place] [facility];  Service:  Gynecology;  Laterality: N/A;  . IR FLUORO GUIDE PORT INSERTION RIGHT  08/27/2017  . IR REMOVAL TUN ACCESS W/ PORT W/O FL MOD SED  04/27/2018  . IR US GUIDE VASC ACCESS RIGHT  08/27/2017  . LAPAROSCOPIC CHOLECYSTECTOMY  2005  . ROBOTIC ASSISTED TOTAL HYSTERECTOMY WITH BILATERAL SALPINGO OOPHERECTOMY Bilateral 08/05/2017   Procedure: ROBOTIC ASSISTED TOTAL HYSTERECTOMY WITH BILATERAL SALPINGO OOPHORECTOMY;  Surgeon: Everitt Amber, MD;  [facility];  Service: Gynecology;  Laterality: Bilateral;  . SENTINEL NODE BIOPSY Bilateral 08/05/2017   Procedure: SENTINEL NODE BIOPSY;  Surgeon: Everitt Amber, MD;  [facility];  Service: Gynecology;  Laterality: Bilateral;  . UMBILICAL HERNIA REPAIR N/A 08/05/2017   Procedure: HERNIA REPAIR UMBILICAL ADULT;  Surgeon: Everitt Amber, MD;  [facility];  Service: Gynecology;  Laterality: N/A;  . VEIN SURGERY Right 2011   vein stripping-leg  and laser treatment    There were no vitals filed for this visit.  Subjective Assessment - 08/17/19 1416    Subjective  COVID-19 screen performed prior to patient entering clinic.  Doing good.    Pertinent History  right humeral head fracture 07/02/2019 with routine healing per patient report; HTN, DM    Limitations  Lifting;Writing;House hold activities    Diagnostic tests  x-ray: right humeral head fracture    Patient  Stated Goals  "return to fully doing regular tasks"    Currently in Pain?  Yes    Pain Score  2     Pain Location  Arm    Pain Orientation  Right    Pain Descriptors / Indicators  Dull    Pain Type  Acute pain                       [facility] Adult PT Treatment/Exercise - 08/17/19 0001      Moist Heat Therapy   Number Minutes Moist Heat  15 Minutes    Moist Heat Location  --   Right shoulder.     Theme park manager  RT SHLD    Electrical Stimulation Action  IFC (Non-motoric)    Electrical Stimulation Parameters  80-150 Hz x 15 minutes.     Electrical Stimulation Goals  Pain      Manual Therapy   Manual Therapy  Passive ROM    Passive ROM  In supine:  Gentle PAROM x 23 minutes to patients right shoulder into flexion and ER.                  PT Long Term Goals - 08/12/19 1014      PT LONG TERM GOAL #1   Title  Patient will be independent HEP    Time  4    Period  Weeks    Status  On-going      PT LONG TERM GOAL #2   Title  Patient will demonstrate 135+ degrees of right shoulder flexion AROM to improve ability to perform overhead tasks.    Time  4    Period  Weeks    Status  On-going      PT LONG TERM GOAL #3   Title  Patient will demonstrate 55+ degrees of right shoulder ER AROM to improve ability to don and doff apparel.    Time  4    Period  Weeks    Status  On-going      PT LONG TERM GOAL #4   Title  Patient will report ability to perform ADLs and home activities with right shoulder pain less than or equal to 3/10.    Time  4    Period  Weeks    Status  On-going            Plan - 08/17/19 1418    Clinical Impression Statement  Patient did very well with treatment today.  She is improved with regards to range of motion since starting PT.    Personal Factors and Comorbidities  Age;Comorbidity 2    Comorbidities  right humeral head fracture 07/02/2019, HTN, DM    Examination-Activity Limitations  Bathing;Dressing;Hygiene/Grooming;Lift;Carry    Examination-Participation Restrictions  Driving    Stability/Clinical Decision Making  Stable/Uncomplicated    Rehab Potential  Good    PT Frequency  3x / week    PT Duration  4 weeks    PT Treatment/Interventions  ADLs/Self Care Home Management;Iontophoresis 4mg /ml Dexamethasone;Moist Heat;Electrical Stimulation;Cryotherapy;Therapeutic activities;Therapeutic exercise;Neuromuscular re-education;Manual techniques;Passive range of motion;Patient/family education    PT Next Visit Plan  cont with POC for Very slow progression. PROM and AAROM per MD referral,  modalities PRN for pain relief.    PT Home Exercise Plan  see patient education section    Consulted and Agree with Plan of Care  Patient       Patient will benefit from skilled  therapeutic intervention in order to improve the following deficits and impairments:  Decreased activity tolerance, Decreased strength, Decreased range of motion, Impaired UE functional use, Pain, Postural dysfunction  Visit Diagnosis: Acute pain of right shoulder  Stiffness of right shoulder, not elsewhere classified     Problem List Patient Active Problem List   Diagnosis Date Noted  . Pancytopenia, acquired ([place]) 12/16/2017  . Anemia due to antineoplastic chemotherapy 11/26/2017  . Peripheral neuropathy due to chemotherapy ([place]) 10/14/2017  . Genetic testing 09/19/2017  . Family history of ovarian cancer   . Family history of pancreatic cancer   . Obesity (BMI 30-39.9) 08/26/2017  . Easy bruising 08/18/2017  . Endometrial cancer, FIGO stage IIIA ([place]) 08/12/2017  . Endometrial cancer ([place]) 08/05/2017  . B12 deficiency 06/26/2017  . Endometrial adenocarcinoma ([place]) 06/25/2017  . Umbilical hernia without obstruction and without gangrene 06/25/2017  . Vitamin D deficiency   . Thrombophlebitis leg 09/18/2013  . Hypertension associated with diabetes ([place]) 12/03/2012  . Diabetes ([place]) 12/03/2012  . Hypothyroidism 12/03/2012  . Hyperlipidemia associated with type 2 diabetes mellitus ([place]) 12/03/2012    Damarys Speir, [place] MPT 08/17/2019, 2:34 PM  [facility] [address] [place], [place], [address] Phone: [phone]   Fax:  [phone]  Name: Alexandria Irwin MRN: [id number] Date of Birth: 08/28/1945

## 2019-08-18 ENCOUNTER — Other Ambulatory Visit: Payer: Self-pay | Admitting: Family

## 2019-08-18 DIAGNOSIS — R42 Dizziness and giddiness: Secondary | ICD-10-CM

## 2019-08-19 ENCOUNTER — Encounter: Payer: Medicare Other | Admitting: Physical Therapy

## 2019-08-19 ENCOUNTER — Ambulatory Visit: Payer: Medicare Other

## 2019-08-24 ENCOUNTER — Other Ambulatory Visit: Payer: Self-pay

## 2019-08-24 ENCOUNTER — Encounter: Payer: Self-pay | Admitting: Physical Therapy

## 2019-08-24 ENCOUNTER — Ambulatory Visit: Payer: Medicare Other | Admitting: Physical Therapy

## 2019-08-24 DIAGNOSIS — M25511 Pain in right shoulder: Secondary | ICD-10-CM

## 2019-08-24 DIAGNOSIS — M25611 Stiffness of right shoulder, not elsewhere classified: Secondary | ICD-10-CM

## 2019-08-24 NOTE — Therapy (Signed)
[facility] [place], [place], [address] Phone: [phone]   Fax:  [phone]  Physical Therapy Treatment  Patient Details  Name: Alexandria Irwin MRN: [id number] Date of Birth: 05-19-1946 Referring Provider (PT): Esmond Plants, MD   Encounter Date: 08/24/2019  PT End of Session - 08/24/19 1350    Visit Number  6    Number of Visits  12    Date for PT Re-Evaluation  09/07/19    Authorization Type  Progress note every 10th visit    PT Start Time  1350    PT Stop Time  1435    PT Time Calculation (min)  45 min    Equipment Utilized During Treatment  Other (comment)   R shoulder sling   Activity Tolerance  Patient tolerated treatment well    Behavior During Therapy  [facility] for tasks assessed/performed       Past Medical History:  Diagnosis Date  . Cataract   . Endometrial adenocarcinoma ([place])   . Environmental and seasonal allergies   . Family history of ovarian cancer   . Family history of pancreatic cancer   . Genetic testing 09/19/2017   Multi-Cancer panel (83 genes) @ [facility] - No pathogenic mutations detected  . GERD (gastroesophageal reflux disease)   . History of DVT of lower extremity    early 2000s--- lower right leg  . Hyperlipidemia   . Hypertension   . Hypothyroidism   . Type 2 diabetes mellitus ([place])    followed by pcp  . Umbilical hernia   . Vitamin B 12 deficiency   . Vitamin D deficiency   . Wears glasses     Past Surgical History:  Procedure Laterality Date  . BREAST BIOPSY Left 2009   benign  . CATARACT EXTRACTION W/ INTRAOCULAR LENS IMPLANT Left 2018  . CATARACT EXTRACTION W/PHACO Right 03/14/2014   Procedure: CATARACT EXTRACTION PHACO AND INTRAOCULAR LENS PLACEMENT (IOC);  Surgeon: Tonny Branch, MD;  Location: [facility];  Service: Ophthalmology;  Laterality: Right;  CDE: 8.34  . FOOT GANGLION EXCISION Right 2013  . HYSTEROSCOPY WITH D & C N/A 06/13/2017   Procedure: DILATATION AND CURETTAGE  /HYSTEROSCOPY;  Surgeon: Alden Hipp, MD;  Location: [place] [facility];  Service: Gynecology;  Laterality: N/A;  . IR FLUORO GUIDE PORT INSERTION RIGHT  08/27/2017  . IR REMOVAL TUN ACCESS W/ PORT W/O FL MOD SED  04/27/2018  . IR US GUIDE VASC ACCESS RIGHT  08/27/2017  . LAPAROSCOPIC CHOLECYSTECTOMY  2005  . ROBOTIC ASSISTED TOTAL HYSTERECTOMY WITH BILATERAL SALPINGO OOPHERECTOMY Bilateral 08/05/2017   Procedure: ROBOTIC ASSISTED TOTAL HYSTERECTOMY WITH BILATERAL SALPINGO OOPHORECTOMY;  Surgeon: Everitt Amber, MD;  [facility];  Service: Gynecology;  Laterality: Bilateral;  . SENTINEL NODE BIOPSY Bilateral 08/05/2017   Procedure: SENTINEL NODE BIOPSY;  Surgeon: Everitt Amber, MD;  [facility];  Service: Gynecology;  Laterality: Bilateral;  . UMBILICAL HERNIA REPAIR N/A 08/05/2017   Procedure: HERNIA REPAIR UMBILICAL ADULT;  Surgeon: Everitt Amber, MD;  [facility];  Service: Gynecology;  Laterality: N/A;  . VEIN SURGERY Right 2011   vein stripping-leg  and laser treatment    There were no vitals filed for this visit.  Subjective Assessment - 08/24/19 1350    Subjective  COVID-19 screen performed prior to patient entering clinic.  Doing good.    Pertinent History  right humeral head fracture 07/02/2019 with routine healing per patient report; HTN, DM    Limitations  Lifting;Writing;House hold activities  Diagnostic tests  x-ray: right humeral head fracture    Patient Stated Goals  "return to fully doing regular tasks"    Currently in Pain?  No/denies         [facility] PT Assessment - 08/24/19 0001      Assessment   Medical Diagnosis  Pain of right shoulder joint    Referring Provider (PT)  Esmond Plants, MD    Onset Date/Surgical Date  07/02/19    Hand Dominance  Right    Next MD Visit  08/25/2019    Prior Therapy  no      Precautions   Precautions  Other (comment)    Precaution Comments  SLOW PROGRESSION, PROM and AAROM to right shoulder      Restrictions   Weight Bearing Restrictions   No      ROM / Strength   AROM / PROM / Strength  PROM      PROM   Overall PROM   Deficits    PROM Assessment Site  Shoulder    Right/Left Shoulder  Right    Right Shoulder Flexion  130 Degrees    Right Shoulder Internal Rotation  75 Degrees    Right Shoulder External Rotation  21 Degrees                   [facility] Adult PT Treatment/Exercise - 08/24/19 0001      Modalities   Modalities  Electrical Stimulation;Moist Heat      Moist Heat Therapy   Number Minutes Moist Heat  15 Minutes    Moist Heat Location  Shoulder      Electrical Stimulation   Electrical Stimulation Location  R shoulder    Electrical Stimulation Action  Pre-mod    Electrical Stimulation Parameters  80-150 hz x15 min    Electrical Stimulation Goals  Pain      Manual Therapy   Manual Therapy  Passive ROM    Passive ROM  PROM of R shoulder into flex, ER with gentle holds at end range                  PT Long Term Goals - 08/12/19 1014      PT LONG TERM GOAL #1   Title  Patient will be independent HEP    Time  4    Period  Weeks    Status  On-going      PT LONG TERM GOAL #2   Title  Patient will demonstrate 135+ degrees of right shoulder flexion AROM to improve ability to perform overhead tasks.    Time  4    Period  Weeks    Status  On-going      PT LONG TERM GOAL #3   Title  Patient will demonstrate 55+ degrees of right shoulder ER AROM to improve ability to don and doff apparel.    Time  4    Period  Weeks    Status  On-going      PT LONG TERM GOAL #4   Title  Patient will report ability to perform ADLs and home activities with right shoulder pain less than or equal to 3/10.    Time  4    Period  Weeks    Status  On-going            Plan - 08/24/19 1430    Clinical Impression Statement  Patient's PROM of R shoulder has improved greatly since beginning PT. Continues to report pain  with eccentric control of R shoulder but no pain at rest. Continues to present  with sling donned. All PROM measurements provided in today's note which show improvement in regards to flexion. Pain also reported with ER initialy but better range achieved by end of session. Normal modalities response noted following removal of the modalities.    Personal Factors and Comorbidities  Age;Comorbidity 2    Comorbidities  right humeral head fracture 07/02/2019, HTN, DM    Examination-Activity Limitations  Bathing;Dressing;Hygiene/Grooming;Lift;Carry    Examination-Participation Restrictions  Driving    Stability/Clinical Decision Making  Stable/Uncomplicated    Rehab Potential  Good    PT Frequency  3x / week    PT Duration  4 weeks    PT Treatment/Interventions  ADLs/Self Care Home Management;Iontophoresis 4mg /ml Dexamethasone;Moist Heat;Electrical Stimulation;Cryotherapy;Therapeutic activities;Therapeutic exercise;Neuromuscular re-education;Manual techniques;Passive range of motion;Patient/family education    PT Next Visit Plan  cont with POC for Very slow progression. PROM and AAROM per MD referral, modalities PRN for pain relief.    PT Home Exercise Plan  see patient education section    Consulted and Agree with Plan of Care  Patient       Patient will benefit from skilled therapeutic intervention in order to improve the following deficits and impairments:  Decreased activity tolerance, Decreased strength, Decreased range of motion, Impaired UE functional use, Pain, Postural dysfunction  Visit Diagnosis: Acute pain of right shoulder  Stiffness of right shoulder, not elsewhere classified     Problem List Patient Active Problem List   Diagnosis Date Noted  . Pancytopenia, acquired ([place]) 12/16/2017  . Anemia due to antineoplastic chemotherapy 11/26/2017  . Peripheral neuropathy due to chemotherapy ([place]) 10/14/2017  . Genetic testing 09/19/2017  . Family history of ovarian cancer   . Family history of pancreatic cancer   . Obesity (BMI 30-39.9) 08/26/2017  . Easy  bruising 08/18/2017  . Endometrial cancer, FIGO stage IIIA ([place]) 08/12/2017  . Endometrial cancer ([place]) 08/05/2017  . B12 deficiency 06/26/2017  . Endometrial adenocarcinoma ([place]) 06/25/2017  . Umbilical hernia without obstruction and without gangrene 06/25/2017  . Vitamin D deficiency   . Thrombophlebitis leg 09/18/2013  . Hypertension associated with diabetes ([place]) 12/03/2012  . Diabetes ([place]) 12/03/2012  . Hypothyroidism 12/03/2012  . Hyperlipidemia associated with type 2 diabetes mellitus ([place]) 12/03/2012    Standley Brooking, PTA 08/24/2019, 2:41 PM  [facility] [address] [place], [place], [address] Phone: [phone]   Fax:  [phone]  Name: Alexandria Irwin MRN: [id number] Date of Birth: 04/24/1946

## 2019-08-25 ENCOUNTER — Other Ambulatory Visit: Payer: Self-pay | Admitting: *Deleted

## 2019-08-25 DIAGNOSIS — M25511 Pain in right shoulder: Secondary | ICD-10-CM | POA: Diagnosis not present

## 2019-08-25 MED ORDER — SIMVASTATIN 40 MG PO TABS: ORAL_TABLET | ORAL | 0 refills | Status: DC

## 2019-08-25 MED ORDER — LEVOTHYROXINE SODIUM 88 MCG PO TABS: 88.0000 ug | ORAL_TABLET | Freq: Every day | ORAL | 3 refills | Status: DC

## 2019-08-26 DIAGNOSIS — L84 Corns and callosities: Secondary | ICD-10-CM | POA: Diagnosis not present

## 2019-08-26 DIAGNOSIS — M79676 Pain in unspecified toe(s): Secondary | ICD-10-CM | POA: Diagnosis not present

## 2019-08-26 DIAGNOSIS — B351 Tinea unguium: Secondary | ICD-10-CM | POA: Diagnosis not present

## 2019-08-26 DIAGNOSIS — E1142 Type 2 diabetes mellitus with diabetic polyneuropathy: Secondary | ICD-10-CM | POA: Diagnosis not present

## 2019-08-30 ENCOUNTER — Encounter: Payer: Self-pay | Admitting: Physical Therapy

## 2019-08-30 ENCOUNTER — Ambulatory Visit: Payer: Medicare Other | Attending: Orthopedic Surgery | Admitting: Physical Therapy

## 2019-08-30 ENCOUNTER — Other Ambulatory Visit: Payer: Self-pay

## 2019-08-30 DIAGNOSIS — M25611 Stiffness of right shoulder, not elsewhere classified: Secondary | ICD-10-CM | POA: Insufficient documentation

## 2019-08-30 DIAGNOSIS — M25511 Pain in right shoulder: Secondary | ICD-10-CM | POA: Diagnosis not present

## 2019-08-30 NOTE — Therapy (Signed)
[facility] [place], [place], [address] Phone: [phone]   Fax:  [phone]  Physical Therapy Treatment  Patient Details  Name: Alexandria Irwin MRN: [id number] Date of Birth: 01-Nov-1945 Referring Provider (PT): Esmond Plants, MD   Encounter Date: 08/30/2019  PT End of Session - 08/30/19 0936    Visit Number  7    Number of Visits  12    Date for PT Re-Evaluation  09/07/19    Authorization Type  Progress note every 10th visit    PT Start Time  0900    PT Stop Time  0941    PT Time Calculation (min)  41 min    Activity Tolerance  Patient tolerated treatment well    Behavior During Therapy  [facility] for tasks assessed/performed       Past Medical History:  Diagnosis Date  . Cataract   . Endometrial adenocarcinoma ([place])   . Environmental and seasonal allergies   . Family history of ovarian cancer   . Family history of pancreatic cancer   . Genetic testing 09/19/2017   Multi-Cancer panel (83 genes) @ [facility] - No pathogenic mutations detected  . GERD (gastroesophageal reflux disease)   . History of DVT of lower extremity    early 2000s--- lower right leg  . Hyperlipidemia   . Hypertension   . Hypothyroidism   . Type 2 diabetes mellitus ([place])    followed by pcp  . Umbilical hernia   . Vitamin B 12 deficiency   . Vitamin D deficiency   . Wears glasses     Past Surgical History:  Procedure Laterality Date  . BREAST BIOPSY Left 2009   benign  . CATARACT EXTRACTION W/ INTRAOCULAR LENS IMPLANT Left 2018  . CATARACT EXTRACTION W/PHACO Right 03/14/2014   Procedure: CATARACT EXTRACTION PHACO AND INTRAOCULAR LENS PLACEMENT (IOC);  Surgeon: Tonny Branch, MD;  [facility];  Service: Ophthalmology;  Laterality: Right;  CDE: 8.34  . FOOT GANGLION EXCISION Right 2013  . HYSTEROSCOPY WITH D & C N/A 06/13/2017   Procedure: DILATATION AND CURETTAGE /HYSTEROSCOPY;  Surgeon: Alden Hipp, MD;  Location: [place] [facility];  Service:  Gynecology;  Laterality: N/A;  . IR FLUORO GUIDE PORT INSERTION RIGHT  08/27/2017  . IR REMOVAL TUN ACCESS W/ PORT W/O FL MOD SED  04/27/2018  . IR US GUIDE VASC ACCESS RIGHT  08/27/2017  . LAPAROSCOPIC CHOLECYSTECTOMY  2005  . ROBOTIC ASSISTED TOTAL HYSTERECTOMY WITH BILATERAL SALPINGO OOPHERECTOMY Bilateral 08/05/2017   Procedure: ROBOTIC ASSISTED TOTAL HYSTERECTOMY WITH BILATERAL SALPINGO OOPHORECTOMY;  Surgeon: Everitt Amber, MD;  [facility];  Service: Gynecology;  Laterality: Bilateral;  . SENTINEL NODE BIOPSY Bilateral 08/05/2017   Procedure: SENTINEL NODE BIOPSY;  Surgeon: Everitt Amber, MD;  [facility];  Service: Gynecology;  Laterality: Bilateral;  . UMBILICAL HERNIA REPAIR N/A 08/05/2017   Procedure: HERNIA REPAIR UMBILICAL ADULT;  Surgeon: Everitt Amber, MD;  [facility];  Service: Gynecology;  Laterality: N/A;  . VEIN SURGERY Right 2011   vein stripping-leg  and laser treatment    There were no vitals filed for this visit.  Subjective Assessment - 08/30/19 0916    Subjective  COVID-19 screen performed prior to patient entering clinic.  Went to MD and is to continue therapy gentle progression and x1 week    Pertinent History  right humeral head fracture 07/02/2019 with routine healing per patient report; HTN, DM    Limitations  Lifting;Writing;House hold activities    Diagnostic  tests  x-ray: right humeral head fracture    Patient Stated Goals  "return to fully doing regular tasks"    Currently in Pain?  No/denies         [facility] PT Assessment - 08/30/19 0001      PROM   PROM Assessment Site  Shoulder    Right/Left Shoulder  Right    Right Shoulder Flexion  134 Degrees    Right Shoulder External Rotation  23 Degrees                   [facility] Adult PT Treatment/Exercise - 08/30/19 0001      Moist Heat Therapy   Number Minutes Moist Heat  15 Minutes    Moist Heat Location  Shoulder      Electrical Stimulation   Electrical Stimulation Location  R shoulder     Electrical Stimulation Action  premod    Electrical Stimulation Parameters  80-150hz  x52min    Electrical Stimulation Goals  Pain      Manual Therapy   Manual Therapy  Passive ROM    Passive ROM  PROM of R shoulder into flex, ER with gentle holds at end range                  PT Long Term Goals - 08/12/19 1014      PT LONG TERM GOAL #1   Title  Patient will be independent HEP    Time  4    Period  Weeks    Status  On-going      PT LONG TERM GOAL #2   Title  Patient will demonstrate 135+ degrees of right shoulder flexion AROM to improve ability to perform overhead tasks.    Time  4    Period  Weeks    Status  On-going      PT LONG TERM GOAL #3   Title  Patient will demonstrate 55+ degrees of right shoulder ER AROM to improve ability to don and doff apparel.    Time  4    Period  Weeks    Status  On-going      PT LONG TERM GOAL #4   Title  Patient will report ability to perform ADLs and home activities with right shoulder pain less than or equal to 3/10.    Time  4    Period  Weeks    Status  On-going            Plan - 08/30/19 [id number]    Clinical Impression Statement  Patient tolerated treatment well today. Patient reported going to Md and is to continue with gentle motion at this time. Patient reported she fell asleep in recliner and fell out of it and did tell MD about fall, no other injury reported and he will x-ray it next month. Patient improved with ROM today for flexion and ER. Patient has no pain at rest and some soreness with movement. Patient current goals progressing.    Personal Factors and Comorbidities  Age;Comorbidity 2    Comorbidities  right humeral head fracture 07/02/2019, HTN, DM    Examination-Activity Limitations  Bathing;Dressing;Hygiene/Grooming;Lift;Carry    Examination-Participation Restrictions  Driving    Stability/Clinical Decision Making  Stable/Uncomplicated    Rehab Potential  Good    PT Frequency  3x / week    PT Duration  4  weeks    PT Treatment/Interventions  ADLs/Self Care Home Management;Iontophoresis 4mg /ml Dexamethasone;Moist Heat;Electrical Stimulation;Cryotherapy;Therapeutic activities;Therapeutic exercise;Neuromuscular re-education;Manual techniques;Passive range  of motion;Patient/family education    PT Next Visit Plan  cont with POC for ROM progression. PROM and AAROM per MD referral, modalities PRN for pain relief.    Consulted and Agree with Plan of Care  Patient       Patient will benefit from skilled therapeutic intervention in order to improve the following deficits and impairments:  Decreased activity tolerance, Decreased strength, Decreased range of motion, Impaired UE functional use, Pain, Postural dysfunction  Visit Diagnosis: Stiffness of right shoulder, not elsewhere classified  Acute pain of right shoulder     Problem List Patient Active Problem List   Diagnosis Date Noted  . Pancytopenia, acquired ([place]) 12/16/2017  . Anemia due to antineoplastic chemotherapy 11/26/2017  . Peripheral neuropathy due to chemotherapy ([place]) 10/14/2017  . Genetic testing 09/19/2017  . Family history of ovarian cancer   . Family history of pancreatic cancer   . Obesity (BMI 30-39.9) 08/26/2017  . Easy bruising 08/18/2017  . Endometrial cancer, FIGO stage IIIA ([place]) 08/12/2017  . Endometrial cancer ([place]) 08/05/2017  . B12 deficiency 06/26/2017  . Endometrial adenocarcinoma ([place]) 06/25/2017  . Umbilical hernia without obstruction and without gangrene 06/25/2017  . Vitamin D deficiency   . Thrombophlebitis leg 09/18/2013  . Hypertension associated with diabetes ([place]) 12/03/2012  . Diabetes ([place]) 12/03/2012  . Hypothyroidism 12/03/2012  . Hyperlipidemia associated with type 2 diabetes mellitus ([place]) 12/03/2012    Geoge Lawrance P, PTA 08/30/2019, 9:47 AM  [facility] [place], [place], [address] Phone: [phone]   Fax:   [phone]  Name: Alexandria Irwin MRN: [id number] Date of Birth: 1945/07/14

## 2019-08-31 ENCOUNTER — Ambulatory Visit: Payer: Medicare Other

## 2019-09-01 ENCOUNTER — Telehealth (INDEPENDENT_AMBULATORY_CARE_PROVIDER_SITE_OTHER): Payer: Medicare Other | Admitting: Family Medicine

## 2019-09-01 ENCOUNTER — Encounter: Payer: Self-pay | Admitting: Family Medicine

## 2019-09-01 DIAGNOSIS — L03115 Cellulitis of right lower limb: Secondary | ICD-10-CM | POA: Diagnosis not present

## 2019-09-01 MED ORDER — DOXYCYCLINE HYCLATE 100 MG PO TABS: 100.0000 mg | ORAL_TABLET | Freq: Two times a day (BID) | ORAL | 0 refills | Status: AC

## 2019-09-01 NOTE — Progress Notes (Addendum)
Virtual Visit via Video Note  I connected with Alexandria Irwin on 09/01/19 at 10:29 AM by video and verified that I am speaking with the correct person using two identifiers. Alexandria Irwin is currently located at home and her daughter is currently with her during visit, which she is okay with. The provider, Loman Brooklyn, FNP is located in their office at time of visit.  I discussed the limitations, risks, security and privacy concerns of performing an evaluation and management service by video and the availability of in person appointments. I also discussed with the patient that there may be a patient responsible charge related to this service. The patient expressed understanding and agreed to proceed.  Subjective: PCP: Alexandria Balloon, FNP  Chief Complaint  Patient presents with  . Leg infection   Patient reports swelling, erythema, and warmth to her right foot and leg.  States this has been going on for the past week.  She has been applying intensive healing hydrocortisone lotion which does not seem to have helped.  She reports she does have a history of cellulitis.   ROS: Per HPI  Current Outpatient Medications:  .  acetaminophen (TYLENOL) 500 MG tablet, Take 1,000 mg by mouth every 8 (eight) hours as needed for mild pain or headache., Disp: , Rfl:  .  aspirin EC 81 MG tablet, Take 81 mg by mouth daily., Disp: , Rfl:  .  Calcium Citrate-Vitamin D ([employer] CALCIUM CITRATE +D PO), Take 2 tablets by mouth every evening. , Disp: , Rfl:  .  Cholecalciferol (D 2000) 2000 units TABS, Take 2,000 Units by mouth daily., Disp: , Rfl:  .  cyanocobalamin (,VITAMIN B-12,) 1000 MCG/ML injection, Inject 1,000 mcg into the muscle every 30 (thirty) days. , Disp: , Rfl:  .  cycloSPORINE (RESTASIS) 0.05 % ophthalmic emulsion, Place 1 drop into both eyes 2 (two) times daily. , Disp: , Rfl:  .  furosemide (LASIX) 20 MG tablet, TAKE 1 TABLET BY MOUTH EVERY DAY, Disp: 30 tablet, Rfl: 1 .  JANUVIA 50 MG  tablet, Take 1 tablet by mouth once daily, Disp: 90 tablet, Rfl: 0 .  Lancets (ONETOUCH DELICA PLUS 123XX123) MISC, 1 each by Other route daily. Dx E11.9, Disp: 100 each, Rfl: 3 .  levothyroxine (SYNTHROID) 88 MCG tablet, Take 1 tablet (88 mcg total) by mouth daily., Disp: 90 tablet, Rfl: 3 .  losartan (COZAAR) 100 MG tablet, Take 1 tablet by mouth once daily, Disp: 90 tablet, Rfl: 1 .  Magnesium 250 MG TABS, Take 250 mg by mouth every evening. , Disp: , Rfl:  .  meclizine (ANTIVERT) 25 MG tablet, Take 1 tablet (25 mg total) by mouth 2 (two) times daily as needed for dizziness., Disp: 60 tablet, Rfl: 0 .  metFORMIN (GLUCOPHAGE XR) 500 MG 24 hr tablet, Take 2 tablets (1,000 mg total) by mouth daily with breakfast., Disp: 180 tablet, Rfl: 1 .  metoprolol succinate (TOPROL-XL) 50 MG 24 hr tablet, TAKE 1 TABLET BY MOUTH TWICE DAILY WITH MEALS, Disp: 180 tablet, Rfl: 1 .  Omega-3 Fatty Acids (FISH OIL) 1000 MG CAPS, Take 1,000 mg by mouth every evening., Disp: , Rfl:  .  ONETOUCH VERIO test strip, USE 1 STRIP TO CHECK GLUCOSE ONCE DAILY, Disp: 100 each, Rfl: 0 .  simvastatin (ZOCOR) 40 MG tablet, TAKE 1 TABLET BY MOUTH ONCE DAILY AT  6PM, Disp: 90 tablet, Rfl: 0 .  vitamin C (ASCORBIC ACID) 500 MG tablet, Take 500 mg  by mouth daily., Disp: , Rfl:   Allergies  Allergen Reactions  . Ace Inhibitors Cough  . Ciprocin-Fluocin-Procin [Fluocinolone] Hives  . Norvasc [Amlodipine] Cough  . Latex Rash  . Neosporin [Neomycin-Bacitracin Zn-Polymyx] Rash  . Sulfa Antibiotics Rash   Past Medical History:  Diagnosis Date  . Cataract   . Endometrial adenocarcinoma ([place])   . Environmental and seasonal allergies   . Family history of ovarian cancer   . Family history of pancreatic cancer   . Genetic testing 09/19/2017   Multi-Cancer panel (83 genes) @ [facility] - No pathogenic mutations detected  . GERD (gastroesophageal reflux disease)   . History of DVT of lower extremity    early 2000s--- lower right leg   . Hyperlipidemia   . Hypertension   . Hypothyroidism   . Type 2 diabetes mellitus ([place])    followed by pcp  . Umbilical hernia   . Vitamin B 12 deficiency   . Vitamin D deficiency   . Wears glasses     Observations/Objective: Physical Exam Vitals reviewed.  Constitutional:      General: She is not in acute distress.    Appearance: Normal appearance. She is not ill-appearing or toxic-appearing.  Eyes:     General: No scleral icterus.       Right eye: No discharge.        Left eye: No discharge.     Conjunctiva/sclera: Conjunctivae normal.  Pulmonary:     Effort: Pulmonary effort is normal. No respiratory distress.  Musculoskeletal:     Right lower leg: Edema present.  Skin:    Findings: Erythema (RLE) present.  Neurological:     General: No focal deficit present.     Mental Status: She is alert and oriented to person, place, and time. Mental status is at baseline.  Psychiatric:        Mood and Affect: Mood normal.        Behavior: Behavior normal.        Thought Content: Thought content normal.        Judgment: Judgment normal.    Assessment and Plan: 1. Cellulitis of right lower extremity - Education provided on cellulitis. - doxycycline (VIBRA-TABS) 100 MG tablet; Take 1 tablet (100 mg total) by mouth 2 (two) times daily for 10 days. 1 po bid  Dispense: 20 tablet; Refill: 0   Follow Up Instructions:     I discussed the assessment and treatment plan with the patient. The patient was provided an opportunity to ask questions and all were answered. The patient agreed with the plan and demonstrated an understanding of the instructions.   The patient was advised to call back or seek an in-person evaluation if the symptoms worsen or if the condition fails to improve as anticipated.  The above assessment and management plan was discussed with the patient. The patient verbalized understanding of and has agreed to the management plan. Patient is aware to call the clinic  if symptoms persist or worsen. Patient is aware when to return to the clinic for a follow-up visit. Patient educated on when it is appropriate to go to the emergency department.   Time call ended: 10:38 AM  I provided 11 minutes of face-to-face time during this encounter.   Hendricks Limes, MSN, APRN, FNP-C [facility] 09/01/19

## 2019-09-01 NOTE — Patient Instructions (Signed)

## 2019-09-02 ENCOUNTER — Ambulatory Visit: Payer: Medicare Other | Attending: Internal Medicine

## 2019-09-02 DIAGNOSIS — Z23 Encounter for immunization: Secondary | ICD-10-CM

## 2019-09-02 NOTE — Progress Notes (Signed)
   Covid-19 Vaccination Clinic  Name:  THERESIA NEWITT    MRN: [id number] DOB: [birth year]  09/02/2019  Ms. Brownback was observed post Covid-19 immunization for 15 minutes without incident. She was provided with Vaccine Information Sheet and instruction to access the V-Safe system.   Ms. Mathiowetz was instructed to call 911 with any severe reactions post vaccine: Marland Kitchen Difficulty breathing  . Swelling of face and throat  . A fast heartbeat  . A bad rash all over body  . Dizziness and weakness   Immunizations Administered    Name Date Dose VIS Date Route   Pfizer COVID-19 Vaccine 09/02/2019  4:23 PM 0.3 mL 06/11/2019 Intramuscular   Manufacturer: [place]   Lot: [id number]   [place]: [id number]

## 2019-09-07 ENCOUNTER — Other Ambulatory Visit: Payer: Self-pay

## 2019-09-07 ENCOUNTER — Encounter: Payer: Self-pay | Admitting: Physical Therapy

## 2019-09-07 ENCOUNTER — Ambulatory Visit: Payer: Medicare Other | Admitting: Physical Therapy

## 2019-09-07 DIAGNOSIS — M25511 Pain in right shoulder: Secondary | ICD-10-CM

## 2019-09-07 DIAGNOSIS — M25611 Stiffness of right shoulder, not elsewhere classified: Secondary | ICD-10-CM | POA: Diagnosis not present

## 2019-09-07 NOTE — Therapy (Signed)
[facility] [place], [place], [address] Phone: [phone]   Fax:  [phone]  Physical Therapy Treatment  Patient Details  Name: Alexandria Irwin MRN: [id number] Date of Birth: Aug 17, 1945 Referring Provider (PT): Esmond Plants, MD   Encounter Date: 09/07/2019  PT End of Session - 09/07/19 0904    Visit Number  8    Number of Visits  12    Date for PT Re-Evaluation  09/07/19    Authorization Type  Progress note every 10th visit    PT Start Time  0902    PT Stop Time  0949    PT Time Calculation (min)  47 min    Equipment Utilized During Treatment  Other (comment)   sling   Activity Tolerance  Patient tolerated treatment well    Behavior During Therapy  [facility] for tasks assessed/performed       Past Medical History:  Diagnosis Date  . Cataract   . Endometrial adenocarcinoma ([place])   . Environmental and seasonal allergies   . Family history of ovarian cancer   . Family history of pancreatic cancer   . Genetic testing 09/19/2017   Multi-Cancer panel (83 genes) @ [facility] - No pathogenic mutations detected  . GERD (gastroesophageal reflux disease)   . History of DVT of lower extremity    early 2000s--- lower right leg  . Hyperlipidemia   . Hypertension   . Hypothyroidism   . Type 2 diabetes mellitus ([place])    followed by pcp  . Umbilical hernia   . Vitamin B 12 deficiency   . Vitamin D deficiency   . Wears glasses     Past Surgical History:  Procedure Laterality Date  . BREAST BIOPSY Left 2009   benign  . CATARACT EXTRACTION W/ INTRAOCULAR LENS IMPLANT Left 2018  . CATARACT EXTRACTION W/PHACO Right 03/14/2014   Procedure: CATARACT EXTRACTION PHACO AND INTRAOCULAR LENS PLACEMENT (IOC);  Surgeon: Tonny Branch, MD;  Location: [facility];  Service: Ophthalmology;  Laterality: Right;  CDE: 8.34  . FOOT GANGLION EXCISION Right 2013  . HYSTEROSCOPY WITH D & C N/A 06/13/2017   Procedure: DILATATION AND CURETTAGE /HYSTEROSCOPY;   Surgeon: Alden Hipp, MD;  Location: [facility];  Service: Gynecology;  Laterality: N/A;  . IR FLUORO GUIDE PORT INSERTION RIGHT  08/27/2017  . IR REMOVAL TUN ACCESS W/ PORT W/O FL MOD SED  04/27/2018  . IR US GUIDE VASC ACCESS RIGHT  08/27/2017  . LAPAROSCOPIC CHOLECYSTECTOMY  2005  . ROBOTIC ASSISTED TOTAL HYSTERECTOMY WITH BILATERAL SALPINGO OOPHERECTOMY Bilateral 08/05/2017   Procedure: ROBOTIC ASSISTED TOTAL HYSTERECTOMY WITH BILATERAL SALPINGO OOPHORECTOMY;  Surgeon: Everitt Amber, MD;  Location: [facility];  Service: Gynecology;  Laterality: Bilateral;  . SENTINEL NODE BIOPSY Bilateral 08/05/2017   Procedure: SENTINEL NODE BIOPSY;  Surgeon: Everitt Amber, MD;  Location: [facility];  Service: Gynecology;  Laterality: Bilateral;  . UMBILICAL HERNIA REPAIR N/A 08/05/2017   Procedure: HERNIA REPAIR UMBILICAL ADULT;  Surgeon: Everitt Amber, MD;  Location: [facility];  Service: Gynecology;  Laterality: N/A;  . VEIN SURGERY Right 2011   vein stripping-leg  and laser treatment    There were no vitals filed for this visit.  Subjective Assessment - 09/07/19 0903    Subjective  COVID-19 screen performed prior to patient entering clinic.  Reports that she can reach up/down fairly well but in and out motion is more difficult.    Pertinent History  right humeral head fracture 07/02/2019 with routine healing per patient  report; HTN, DM    Limitations  Lifting;Writing;House hold activities    Diagnostic tests  x-ray: right humeral head fracture    Patient Stated Goals  "return to fully doing regular tasks"    Currently in Pain?  No/denies         [facility] PT Assessment - 09/07/19 0001      Assessment   Medical Diagnosis  Pain of right shoulder joint    Referring Provider (PT)  Esmond Plants, MD    Onset Date/Surgical Date  07/02/19    Hand Dominance  Right    Prior Therapy  no      Precautions   Precautions  Other (comment)    Precaution Comments  SLOW PROGRESSION, PROM and AAROM to right shoulder      Restrictions    Weight Bearing Restrictions  No                   [facility] Adult PT Treatment/Exercise - 09/07/19 0001      Exercises   Exercises  Shoulder      Shoulder Exercises: Pulleys   Flexion  3 minutes      Shoulder Exercises: ROM/Strengthening   Ranger  RUE seated UE ranger add/abd, forward/backward x2 min each      Modalities   Modalities  Electrical Stimulation;Moist Heat      Moist Heat Therapy   Number Minutes Moist Heat  15 Minutes    Moist Heat Location  Shoulder      Electrical Stimulation   Electrical Stimulation Location  R shoulder    Electrical Stimulation Action  IFC    Electrical Stimulation Parameters  80-150 hz x15 min    Electrical Stimulation Goals  Pain;Tone      Manual Therapy   Manual Therapy  Passive ROM;Soft tissue mobilization    Soft tissue mobilization  STW to R triceps to reduce tone    Passive ROM  PROM of R shoulder into flex, ER with gentle holds at end range                  PT Long Term Goals - 08/12/19 1014      PT LONG TERM GOAL #1   Title  Patient will be independent HEP    Time  4    Period  Weeks    Status  On-going      PT LONG TERM GOAL #2   Title  Patient will demonstrate 135+ degrees of right shoulder flexion AROM to improve ability to perform overhead tasks.    Time  4    Period  Weeks    Status  On-going      PT LONG TERM GOAL #3   Title  Patient will demonstrate 55+ degrees of right shoulder ER AROM to improve ability to don and doff apparel.    Time  4    Period  Weeks    Status  On-going      PT LONG TERM GOAL #4   Title  Patient will report ability to perform ADLs and home activities with right shoulder pain less than or equal to 3/10.    Time  4    Period  Weeks    Status  On-going            Plan - 09/07/19 0939    Clinical Impression Statement  Patient presented in clinic with hesistancy of progressing lightly to low level AAROM exercises. Patient able to tolerate light progression  fairly well with motion within pain free tolerance. Patient reported increased pain with PROM ER attempts but reports of pain and tightness in tricep region were indicative of increased tone. Moderate release of tone palpable in R tricep with manual therapy. Firm end feels and smooth arc of motion noted during PROM R shoulder flexion. Normal modalties response noted following removal of the modalities.    Personal Factors and Comorbidities  Age;Comorbidity 2    Comorbidities  right humeral head fracture 07/02/2019, HTN, DM    Examination-Activity Limitations  Bathing;Dressing;Hygiene/Grooming;Lift;Carry    Examination-Participation Restrictions  Driving    Stability/Clinical Decision Making  Stable/Uncomplicated    Rehab Potential  Good    PT Frequency  3x / week    PT Duration  4 weeks    PT Treatment/Interventions  ADLs/Self Care Home Management;Iontophoresis 4mg /ml Dexamethasone;Moist Heat;Electrical Stimulation;Cryotherapy;Therapeutic activities;Therapeutic exercise;Neuromuscular re-education;Manual techniques;Passive range of motion;Patient/family education    PT Next Visit Plan  cont with POC for ROM progression. PROM and AAROM per MD referral, modalities PRN for pain relief.    PT Home Exercise Plan  see patient education section    Consulted and Agree with Plan of Care  Patient       Patient will benefit from skilled therapeutic intervention in order to improve the following deficits and impairments:  Decreased activity tolerance, Decreased strength, Decreased range of motion, Impaired UE functional use, Pain, Postural dysfunction  Visit Diagnosis: Stiffness of right shoulder, not elsewhere classified  Acute pain of right shoulder     Problem List Patient Active Problem List   Diagnosis Date Noted  . Pancytopenia, acquired ([place]) 12/16/2017  . Anemia due to antineoplastic chemotherapy 11/26/2017  . Peripheral neuropathy due to chemotherapy ([place]) 10/14/2017  . Genetic testing  09/19/2017  . Family history of ovarian cancer   . Family history of pancreatic cancer   . Obesity (BMI 30-39.9) 08/26/2017  . Easy bruising 08/18/2017  . 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 Endometrial cancer ([place]) 08/05/2017  . B12 deficiency 06/26/2017  . Endometrial adenocarcinoma ([place]) 06/25/2017  . Umbilical hernia without obstruction and without gangrene 06/25/2017  . Vitamin D deficiency   . Thrombophlebitis leg 09/18/2013  . Hypertension associated with diabetes ([place]) 12/03/2012  . Diabetes ([place]) 12/03/2012  . Hypothyroidism 12/03/2012  . Hyperlipidemia associated with type 2 diabetes mellitus ([place]) 12/03/2012    Standley Brooking, PTA 09/07/2019, 11:16 AM  [facility] [address] [place], [place], [address] Phone: [phone]   Fax:  [phone]  Name: RAYNETTE CLIFFE MRN: [id number] Date of Birth: 01-03-1946

## 2019-09-12 ENCOUNTER — Other Ambulatory Visit: Payer: Self-pay | Admitting: Family

## 2019-09-12 DIAGNOSIS — R42 Dizziness and giddiness: Secondary | ICD-10-CM

## 2019-09-13 ENCOUNTER — Other Ambulatory Visit: Payer: Self-pay

## 2019-09-13 DIAGNOSIS — R42 Dizziness and giddiness: Secondary | ICD-10-CM

## 2019-09-13 MED ORDER — MECLIZINE HCL 25 MG PO TABS: 25.0000 mg | ORAL_TABLET | Freq: Two times a day (BID) | ORAL | 0 refills | Status: DC | PRN

## 2019-09-14 ENCOUNTER — Other Ambulatory Visit: Payer: Self-pay

## 2019-09-14 ENCOUNTER — Encounter: Payer: Self-pay | Admitting: Family

## 2019-09-14 ENCOUNTER — Ambulatory Visit (INDEPENDENT_AMBULATORY_CARE_PROVIDER_SITE_OTHER): Payer: Medicare Other | Admitting: Family

## 2019-09-14 VITALS — BP 140/79 | HR 79 | Temp 97.3°F | Ht 61.0 in | Wt 183.8 lb

## 2019-09-14 DIAGNOSIS — R609 Edema, unspecified: Secondary | ICD-10-CM | POA: Diagnosis not present

## 2019-09-14 DIAGNOSIS — L03115 Cellulitis of right lower limb: Secondary | ICD-10-CM

## 2019-09-14 DIAGNOSIS — L03116 Cellulitis of left lower limb: Secondary | ICD-10-CM | POA: Diagnosis not present

## 2019-09-14 MED ORDER — DOXYCYCLINE HYCLATE 100 MG PO TABS: 100.0000 mg | ORAL_TABLET | Freq: Two times a day (BID) | ORAL | 0 refills | Status: DC

## 2019-09-14 NOTE — Progress Notes (Signed)
Subjective:    Patient ID: Alexandria Irwin, female    DOB: 08-14-1945, 74 y.o.   MRN: [id number]  Chief Complaint  Patient presents with  . Edema    feet   Pt presents to the office today with bilateral extremity swelling with erythemas. She states she took doxycycline 09/01/19 that helped, but it continues to be swollen and erythemas. She reports intermittent aching pain of 4 out 10. She reports she has been applying hydrocortisone cream that "seems to help dry them out".   Denies any plant contact, new medications, or new chemical.   She has compression hose, but has not been able to get them on since breaking her right shoulder and only has one arm to use.  Rash This is a new problem. The current episode started 1 to 4 weeks ago. The problem has been waxing and waning since onset. The affected locations include the right lower leg and left lower leg. The rash is characterized by redness and swelling. She was exposed to nothing. Past treatments include topical steroids. The treatment provided mild relief.      Review of Systems  Skin: Positive for rash.  All other systems reviewed and are negative.      Objective:   Physical Exam Vitals reviewed.  Constitutional:      General: She is not in acute distress.    Appearance: She is well-developed.  HENT:     Head: Normocephalic and atraumatic.  Eyes:     Pupils: Pupils are equal, round, and reactive to light.  Neck:     Thyroid: No thyromegaly.  Cardiovascular:     Rate and Rhythm: Normal rate and regular rhythm.     Heart sounds: Normal heart sounds. No murmur.  Pulmonary:     Effort: Pulmonary effort is normal. No respiratory distress.     Breath sounds: Normal breath sounds. No wheezing.  Abdominal:     General: Bowel sounds are normal. There is no distension.     Palpations: Abdomen is soft.     Tenderness: There is no abdominal tenderness.  Musculoskeletal:        General: No tenderness.     Cervical back: Normal  range of motion and neck supple.     Right lower leg: Edema (3+) present.     Left lower leg: Edema (3+) present.  Skin:    General: Skin is warm and dry.     Findings: Erythema present.     Comments: Right lower erythemas papules, left erythemas. Slightly warm and tenderness present.   Neurological:     Mental Status: She is alert and oriented to person, place, and time.     Cranial Nerves: No cranial nerve deficit.     Deep Tendon Reflexes: Reflexes are normal and symmetric.  Psychiatric:        Behavior: Behavior normal.        Thought Content: Thought content normal.        Judgment: Judgment normal.         BP 140/79   Pulse 79   Temp (!) 97.3 F (36.3 C) (Temporal)   Ht _0  (1.549 m)   Wt 183 lb 12.8 oz (83.4 kg)   SpO2 97%   BMI 34.73 kg/m      Assessment & Plan:  [name] comes in today with chief complaint of Edema (feet)   Diagnosis and orders addressed:  1. Cellulitis of right lower extremity - doxycycline (VIBRA-TABS) 100  MG tablet; Take 1 tablet (100 mg total) by mouth 2 (two) times daily.  Dispense: 20 tablet; Refill: 0 - CBC with Differential/Platelet - BMP8+EGFR  2. Cellulitis of left lower extremity - doxycycline (VIBRA-TABS) 100 MG tablet; Take 1 tablet (100 mg total) by mouth 2 (two) times daily.  Dispense: 20 tablet; Refill: 0 - CBC with Differential/Platelet - BMP8+EGFR  3. Peripheral edema Keep elevated Rest Low salt diet - doxycycline (VIBRA-TABS) 100 MG tablet; Take 1 tablet (100 mg total) by mouth 2 (two) times daily.  Dispense: 20 tablet; Refill: 0 - CBC with Differential/Platelet - BMP8+EGFR    Evelina Dun, FNP

## 2019-09-14 NOTE — Patient Instructions (Signed)

## 2019-09-15 LAB — CBC WITH DIFFERENTIAL/PLATELET
Basophils Absolute: 0 10*3/uL (ref 0.0–0.2)
Basos: 1 %
EOS (ABSOLUTE): 0.1 10*3/uL (ref 0.0–0.4)
Eos: 3 %
Hematocrit: 37.6 % (ref 34.0–46.6)
Hemoglobin: 12.1 g/dL (ref 11.1–15.9)
Immature Grans (Abs): 0 10*3/uL (ref 0.0–0.1)
Immature Granulocytes: 0 %
Lymphocytes Absolute: 2 10*3/uL (ref 0.7–3.1)
Lymphs: 36 %
MCH: 30.7 pg (ref 26.6–33.0)
MCHC: 32.2 g/dL (ref 31.5–35.7)
MCV: 95 fL (ref 79–97)
Monocytes Absolute: 0.4 10*3/uL (ref 0.1–0.9)
Monocytes: 6 %
Neutrophils Absolute: 3.1 10*3/uL (ref 1.4–7.0)
Neutrophils: 54 %
Platelets: 178 10*3/uL (ref 150–450)
RBC: 3.94 x10E6/uL (ref 3.77–5.28)
RDW: 13.8 % (ref 11.7–15.4)
WBC: 5.6 10*3/uL (ref 3.4–10.8)

## 2019-09-15 LAB — BMP8+EGFR
BUN/Creatinine Ratio: 17 (ref 12–28)
BUN: 15 mg/dL (ref 8–27)
CO2: 25 mmol/L (ref 20–29)
Calcium: 9.9 mg/dL (ref 8.7–10.3)
Chloride: 102 mmol/L (ref 96–106)
Creatinine, Ser: 0.9 mg/dL (ref 0.57–1.00)
GFR calc Af Amer: 72 mL/min/{1.73_m2} (ref >59)
GFR calc non Af Amer: 62 mL/min/{1.73_m2} (ref >59)
Glucose: 199 mg/dL — ABNORMAL HIGH (ref 65–99)
Potassium: 4.5 mmol/L (ref 3.5–5.2)
Sodium: 143 mmol/L (ref 134–144)

## 2019-09-17 ENCOUNTER — Encounter: Payer: Self-pay | Admitting: Physical Therapy

## 2019-09-17 ENCOUNTER — Other Ambulatory Visit: Payer: Self-pay

## 2019-09-17 ENCOUNTER — Ambulatory Visit: Payer: Medicare Other | Admitting: Physical Therapy

## 2019-09-17 DIAGNOSIS — M25511 Pain in right shoulder: Secondary | ICD-10-CM | POA: Diagnosis not present

## 2019-09-17 DIAGNOSIS — M25611 Stiffness of right shoulder, not elsewhere classified: Secondary | ICD-10-CM | POA: Diagnosis not present

## 2019-09-17 NOTE — Therapy (Signed)
[facility] [place], [place], [address] Phone: [phone]   Fax:  [phone]  Physical Therapy Treatment  Patient Details  Name: Alexandria Irwin MRN: [id number] Date of Birth: 16-Aug-1945 Referring Provider (PT): Esmond Plants, MD   Encounter Date: 09/17/2019  PT End of Session - 09/17/19 1251    Visit Number  9    Number of Visits  12    Date for PT Re-Evaluation  09/07/19    Authorization Type  Progress note every 10th visit    PT Start Time  0900    PT Stop Time  0950    PT Time Calculation (min)  50 min    Equipment Utilized During Treatment  Other (comment)   sling   Activity Tolerance  Patient tolerated treatment well    Behavior During Therapy  [facility] for tasks assessed/performed       Past Medical History:  Diagnosis Date  . Cataract   . Endometrial adenocarcinoma ([place])   . Environmental and seasonal allergies   . Family history of ovarian cancer   . Family history of pancreatic cancer   . Genetic testing 09/19/2017   Multi-Cancer panel (83 genes) @ [facility] - No pathogenic mutations detected  . GERD (gastroesophageal reflux disease)   . History of DVT of lower extremity    early 2000s--- lower right leg  . Hyperlipidemia   . Hypertension   . Hypothyroidism   . Type 2 diabetes mellitus ([place])    followed by pcp  . Umbilical hernia   . Vitamin B 12 deficiency   . Vitamin D deficiency   . Wears glasses     Past Surgical History:  Procedure Laterality Date  . BREAST BIOPSY Left 2009   benign  . CATARACT EXTRACTION W/ INTRAOCULAR LENS IMPLANT Left 2018  . CATARACT EXTRACTION W/PHACO Right 03/14/2014   Procedure: CATARACT EXTRACTION PHACO AND INTRAOCULAR LENS PLACEMENT (IOC);  Surgeon: Tonny Branch, MD;  Location: [facility];  Service: Ophthalmology;  Laterality: Right;  CDE: 8.34  . FOOT GANGLION EXCISION Right 2013  . HYSTEROSCOPY WITH D & C N/A 06/13/2017   Procedure: DILATATION AND CURETTAGE /HYSTEROSCOPY;   Surgeon: Alden Hipp, MD;  Location: [facility];  Service: Gynecology;  Laterality: N/A;  . IR FLUORO GUIDE PORT INSERTION RIGHT  08/27/2017  . IR REMOVAL TUN ACCESS W/ PORT W/O FL MOD SED  04/27/2018  . IR US GUIDE VASC ACCESS RIGHT  08/27/2017  . LAPAROSCOPIC CHOLECYSTECTOMY  2005  . ROBOTIC ASSISTED TOTAL HYSTERECTOMY WITH BILATERAL SALPINGO OOPHERECTOMY Bilateral 08/05/2017   Procedure: ROBOTIC ASSISTED TOTAL HYSTERECTOMY WITH BILATERAL SALPINGO OOPHORECTOMY;  Surgeon: Everitt Amber, MD;  Location: [facility];  Service: Gynecology;  Laterality: Bilateral;  . SENTINEL NODE BIOPSY Bilateral 08/05/2017   Procedure: SENTINEL NODE BIOPSY;  Surgeon: Everitt Amber, MD;  Location: [facility];  Service: Gynecology;  Laterality: Bilateral;  . UMBILICAL HERNIA REPAIR N/A 08/05/2017   Procedure: HERNIA REPAIR UMBILICAL ADULT;  Surgeon: Everitt Amber, MD;  Location: [facility];  Service: Gynecology;  Laterality: N/A;  . VEIN SURGERY Right 2011   vein stripping-leg  and laser treatment    There were no vitals filed for this visit.  Subjective Assessment - 09/17/19 1251    Subjective  COVID-19 screen performed prior to patient entering clinic. Patient reported feeling a little more sore after the pulleys last session but states it felt like it loosened things up.    Pertinent History  right humeral head fracture 07/02/2019 with  routine healing per patient report; HTN, DM    Limitations  Lifting;Writing;House hold activities    Diagnostic tests  x-ray: right humeral head fracture    Patient Stated Goals  "return to fully doing regular tasks"    Currently in Pain?  No/denies         [facility] PT Assessment - 09/17/19 0001      Assessment   Medical Diagnosis  Pain of right shoulder joint    Referring Provider (PT)  Esmond Plants, MD    Onset Date/Surgical Date  07/02/19    Hand Dominance  Right    Next MD Visit  09/22/2019    Prior Therapy  no      Precautions   Precautions  Other (comment)    Precaution Comments  SLOW  PROGRESSION, PROM and AAROM to right shoulder      Restrictions   Weight Bearing Restrictions  No      PROM   Right Shoulder Flexion  135 Degrees    Right Shoulder External Rotation  34 Degrees                   [facility] Adult PT Treatment/Exercise - 09/17/19 0001      Exercises   Exercises  Shoulder      Shoulder Exercises: Pulleys   Flexion  5 minutes      Shoulder Exercises: ROM/Strengthening   Ranger  RUE seated UE ranger add/abd, forward/backward x2 min each      Modalities   Modalities  Electrical Stimulation;Moist Heat      Moist Heat Therapy   Number Minutes Moist Heat  15 Minutes    Moist Heat Location  Shoulder      Electrical Stimulation   Electrical Stimulation Location  R shoulder    Electrical Stimulation Action  IFC    Electrical Stimulation Parameters  80-150 hz x15 mins    Electrical Stimulation Goals  Pain;Tone      Manual Therapy   Manual Therapy  Passive ROM;Soft tissue mobilization    Passive ROM  PROM of R shoulder into flex, ER with gentle holds at end range                  PT Long Term Goals - 09/17/19 1258      PT LONG TERM GOAL #1   Title  Patient will be independent HEP    Time  4    Period  Weeks    Status  On-going      PT LONG TERM GOAL #2   Title  Patient will demonstrate 135+ degrees of right shoulder flexion AROM to improve ability to perform overhead tasks.    Time  4    Period  Days    Status  On-going      PT LONG TERM GOAL #3   Title  Patient will demonstrate 55+ degrees of right shoulder ER AROM to improve ability to don and doff apparel.    Time  4    Period  Weeks    Status  On-going      PT LONG TERM GOAL #4   Title  Patient will report ability to perform ADLs and home activities with right shoulder pain less than or equal to 3/10.    Time  4    Period  Weeks    Status  On-going            Plan - 09/17/19 1252    Clinical Impression Statement  Patient was able to tolerate treatment  fairly well with AAROM exercises with no reports of increased pain, just fatigue. Patient noted with improved ER PROM since last assessment but patient continues to have pain in mid triceps area particulary when coming to neutral from shoulder flexion PROM. Patient to see MD on 09/22/2019. No adverse affects upon removal of modalities.    Personal Factors and Comorbidities  Age;Comorbidity 2    Comorbidities  right humeral head fracture 07/02/2019, HTN, DM    Examination-Activity Limitations  Bathing;Dressing;Hygiene/Grooming;Lift;Carry    Examination-Participation Restrictions  Driving    Stability/Clinical Decision Making  Stable/Uncomplicated    Clinical Decision Making  Low    Rehab Potential  Good    PT Frequency  3x / week    PT Duration  4 weeks    PT Treatment/Interventions  ADLs/Self Care Home Management;Iontophoresis 4mg /ml Dexamethasone;Moist Heat;Electrical Stimulation;Cryotherapy;Therapeutic activities;Therapeutic exercise;Neuromuscular re-education;Manual techniques;Passive range of motion;Patient/family education    PT Next Visit Plan  cont with POC for ROM progression. PROM and AAROM per MD referral, modalities PRN for pain relief.    PT Home Exercise Plan  see patient education section    Consulted and Agree with Plan of Care  Patient       Patient will benefit from skilled therapeutic intervention in order to improve the following deficits and impairments:  Decreased activity tolerance, Decreased strength, Decreased range of motion, Impaired UE functional use, Pain, Postural dysfunction  Visit Diagnosis: Stiffness of right shoulder, not elsewhere classified  Acute pain of right shoulder     Problem List Patient Active Problem List   Diagnosis Date Noted  . Pancytopenia, acquired ([place]) 12/16/2017  . Anemia due to antineoplastic chemotherapy 11/26/2017  . Peripheral neuropathy due to chemotherapy ([place]) 10/14/2017  . Genetic testing 09/19/2017  . Family history of  ovarian cancer   . Family history of pancreatic cancer   . Obesity (BMI 30-39.9) 08/26/2017  . Easy bruising 08/18/2017  . 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 Endometrial cancer ([place]) 08/05/2017  . B12 deficiency 06/26/2017  . Endometrial adenocarcinoma ([place]) 06/25/2017  . Umbilical hernia without obstruction and without gangrene 06/25/2017  . Vitamin D deficiency   . Thrombophlebitis leg 09/18/2013  . Hypertension associated with diabetes ([place]) 12/03/2012  . Diabetes ([place]) 12/03/2012  . Hypothyroidism 12/03/2012  . Hyperlipidemia associated with type 2 diabetes mellitus ([place]) 12/03/2012    Gabriela Eves, PT, DPT 09/17/2019, 12:59 PM  [facility] [address] [place], [place], [address] Phone: [phone]   Fax:  [phone]  Name: SYDNY LOBELLO MRN: [id number] Date of Birth: [birth year]

## 2019-09-20 ENCOUNTER — Other Ambulatory Visit: Payer: Self-pay | Admitting: *Deleted

## 2019-09-20 MED ORDER — LOSARTAN POTASSIUM 100 MG PO TABS: 100.0000 mg | ORAL_TABLET | Freq: Every day | ORAL | 0 refills | Status: DC

## 2019-09-21 ENCOUNTER — Ambulatory Visit: Payer: Medicare Other | Admitting: Physical Therapy

## 2019-09-21 ENCOUNTER — Other Ambulatory Visit: Payer: Self-pay

## 2019-09-21 DIAGNOSIS — M25511 Pain in right shoulder: Secondary | ICD-10-CM

## 2019-09-21 DIAGNOSIS — M25611 Stiffness of right shoulder, not elsewhere classified: Secondary | ICD-10-CM

## 2019-09-21 NOTE — Therapy (Signed)
[facility] [place], [place], [address] Phone: [phone]   Fax:  [phone]  Physical Therapy Treatment  Patient Details  Name: LUVERNA JOHANNES MRN: [id number] Date of Birth: 07-Jun-1946 Referring Provider (PT): Esmond Plants, MD   Encounter Date: 09/21/2019  PT End of Session - 09/21/19 1138    Visit Number  10    Number of Visits  12    Date for PT Re-Evaluation  09/07/19    Authorization Type  Progress note every 10th visit    PT Start Time  1030    PT Stop Time  1122    PT Time Calculation (min)  52 min    Equipment Utilized During Treatment  Other (comment)    Activity Tolerance  Patient tolerated treatment well    Behavior During Therapy  [facility] for tasks assessed/performed       Past Medical History:  Diagnosis Date  . Cataract   . Endometrial adenocarcinoma ([place])   . Environmental and seasonal allergies   . Family history of ovarian cancer   . Family history of pancreatic cancer   . Genetic testing 09/19/2017   Multi-Cancer panel (83 genes) @ [facility] - No pathogenic mutations detected  . GERD (gastroesophageal reflux disease)   . History of DVT of lower extremity    early 2000s--- lower right leg  . Hyperlipidemia   . Hypertension   . Hypothyroidism   . Type 2 diabetes mellitus ([place])    followed by pcp  . Umbilical hernia   . Vitamin B 12 deficiency   . Vitamin D deficiency   . Wears glasses     Past Surgical History:  Procedure Laterality Date  . BREAST BIOPSY Left 2009   benign  . CATARACT EXTRACTION W/ INTRAOCULAR LENS IMPLANT Left 2018  . CATARACT EXTRACTION W/PHACO Right 03/14/2014   Procedure: CATARACT EXTRACTION PHACO AND INTRAOCULAR LENS PLACEMENT (IOC);  Surgeon: Tonny Branch, MD;  Location: [facility];  Service: Ophthalmology;  Laterality: Right;  CDE: 8.34  . FOOT GANGLION EXCISION Right 2013  . HYSTEROSCOPY WITH D & C N/A 06/13/2017   Procedure: DILATATION AND CURETTAGE /HYSTEROSCOPY;   Surgeon: Alden Hipp, MD;  Location: [facility];  Service: Gynecology;  Laterality: N/A;  . IR FLUORO GUIDE PORT INSERTION RIGHT  08/27/2017  . IR REMOVAL TUN ACCESS W/ PORT W/O FL MOD SED  04/27/2018  . IR US GUIDE VASC ACCESS RIGHT  08/27/2017  . LAPAROSCOPIC CHOLECYSTECTOMY  2005  . ROBOTIC ASSISTED TOTAL HYSTERECTOMY WITH BILATERAL SALPINGO OOPHERECTOMY Bilateral 08/05/2017   Procedure: ROBOTIC ASSISTED TOTAL HYSTERECTOMY WITH BILATERAL SALPINGO OOPHORECTOMY;  Surgeon: Everitt Amber, MD;  Location: [facility];  Service: Gynecology;  Laterality: Bilateral;  . SENTINEL NODE BIOPSY Bilateral 08/05/2017   Procedure: SENTINEL NODE BIOPSY;  Surgeon: Everitt Amber, MD;  Location: [facility];  Service: Gynecology;  Laterality: Bilateral;  . UMBILICAL HERNIA REPAIR N/A 08/05/2017   Procedure: HERNIA REPAIR UMBILICAL ADULT;  Surgeon: Everitt Amber, MD;  Location: [facility];  Service: Gynecology;  Laterality: N/A;  . VEIN SURGERY Right 2011   vein stripping-leg  and laser treatment    There were no vitals filed for this visit.  Subjective Assessment - 09/21/19 1120    Subjective  COVID-19 screen performed prior to patient entering clinic.  My shoulder is much better than it was.    Pertinent History  right humeral head fracture 07/02/2019 with routine healing per patient report; HTN, DM    Limitations  Lifting;Writing;House hold  activities    Diagnostic tests  x-ray: right humeral head fracture    Patient Stated Goals  "return to fully doing regular tasks"    Currently in Pain?  Yes    Pain Score  2     Pain Location  Arm    Pain Orientation  Right    Pain Descriptors / Indicators  Dull    Pain Type  Acute pain         [facility] PT Assessment - 09/21/19 0001      PROM   Right Shoulder Flexion  140 Degrees    Right Shoulder External Rotation  35 Degrees                   [facility] Adult PT Treatment/Exercise - 09/21/19 0001      Exercises   Exercises  Shoulder      Shoulder Exercises: Pulleys    Flexion  5 minutes    Other Pulley Exercises  Seated UE Ranger into flexion and circles (CW and CCW) x 6 minutes.      Modalities   Modalities  Electrical Stimulation;Moist Heat      Moist Heat Therapy   Number Minutes Moist Heat  15 Minutes    Moist Heat Location  --   Right shoulder.     Acupuncturist Location  Right shoulder.    Electrical Stimulation Action  IFC    Electrical Stimulation Parameters  80-150 Hz x 15 minutes.    Electrical Stimulation Goals  Tone;Pain      Manual Therapy   Manual Therapy  Passive ROM    Passive ROM  PAROM into left shoulder flexion and ER x 14 minutes.                  PT Long Term Goals - 09/21/19 1147      PT LONG TERM GOAL #1   Title  Patient will be independent HEP    Time  4    Period  Weeks    Status  On-going      PT LONG TERM GOAL #2   Title  Patient will demonstrate 135+ degrees of right shoulder flexion AROM to improve ability to perform overhead tasks.    Baseline  PAROM 140 degrees (09/21/19).    Time  4    Period  Days    Status  On-going      PT LONG TERM GOAL #3   Title  Patient will demonstrate 55+ degrees of right shoulder ER AROM to improve ability to don and doff apparel.    Baseline  PAROM 35 degrees (09/21/19).    Time  4    Period  Weeks    Status  On-going      PT LONG TERM GOAL #4   Title  Patient will report ability to perform ADLs and home activities with right shoulder pain less than or equal to 3/10.    Time  4    Period  Weeks    Status  On-going            Plan - 09/21/19 1142    Clinical Impression Statement  The patient did very well today.  She has c/o referred pain into her rigth upper arm and at times to elbow but is pleased with her range of motion gains at this point.    Personal Factors and Comorbidities  Age;Comorbidity 2    Comorbidities  right humeral head fracture  07/02/2019, HTN, DM    Examination-Activity Limitations   Bathing;Dressing;Hygiene/Grooming;Lift;Carry    Stability/Clinical Decision Making  Stable/Uncomplicated    Rehab Potential  Good    PT Frequency  3x / week    PT Duration  4 weeks    PT Treatment/Interventions  ADLs/Self Care Home Management;Iontophoresis 4mg /ml Dexamethasone;Moist Heat;Electrical Stimulation;Cryotherapy;Therapeutic activities;Therapeutic exercise;Neuromuscular re-education;Manual techniques;Passive range of motion;Patient/family education    PT Next Visit Plan  cont with POC for ROM progression. PROM and AAROM per MD referral, modalities PRN for pain relief.    PT Home Exercise Plan  see patient education section    Consulted and Agree with Plan of Care  Patient       Patient will benefit from skilled therapeutic intervention in order to improve the following deficits and impairments:  Decreased activity tolerance, Decreased strength, Decreased range of motion, Impaired UE functional use, Pain, Postural dysfunction  Visit Diagnosis: Stiffness of right shoulder, not elsewhere classified  Acute pain of right shoulder     Problem List Patient Active Problem List   Diagnosis Date Noted  . Pancytopenia, acquired ([place]) 12/16/2017  . Anemia due to antineoplastic chemotherapy 11/26/2017  . Peripheral neuropathy due to chemotherapy ([place]) 10/14/2017  . Genetic testing 09/19/2017  . Family history of ovarian cancer   . Family history of pancreatic cancer   . Obesity (BMI 30-39.9) 08/26/2017  . Easy bruising 08/18/2017  . 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 Endometrial cancer ([place]) 08/05/2017  . B12 deficiency 06/26/2017  . Endometrial adenocarcinoma ([place]) 06/25/2017  . Umbilical hernia without obstruction and without gangrene 06/25/2017  . Vitamin D deficiency   . Thrombophlebitis leg 09/18/2013  . Hypertension associated with diabetes ([place]) 12/03/2012  . Diabetes ([place]) 12/03/2012  . Hypothyroidism 12/03/2012  . Hyperlipidemia associated with  type 2 diabetes mellitus ([place]) 12/03/2012   Progress Note Reporting Period 08/03/19 to 09/21/19  See note below for Objective Data and Assessment of Progress/Goals. Very nice improvement in left shoulder range of motion since beginning PT with PAROM right shoulder flexion to 140 degrees and ER= 35 degrees.       Jayleon Mcfarlane, [place] MPT 09/21/2019, 11:50 AM  [facility] [address] [place], [place], [address] Phone: [phone]   Fax:  [phone]  Name: MEOSHIA HAWKEN MRN: [id number] Date of Birth: Feb 26, 1946

## 2019-09-22 DIAGNOSIS — S42201D Unspecified fracture of upper end of right humerus, subsequent encounter for fracture with routine healing: Secondary | ICD-10-CM | POA: Diagnosis not present

## 2019-09-22 DIAGNOSIS — M25511 Pain in right shoulder: Secondary | ICD-10-CM | POA: Diagnosis not present

## 2019-09-24 ENCOUNTER — Other Ambulatory Visit: Payer: Self-pay | Admitting: Family

## 2019-09-24 MED ORDER — GLIMEPIRIDE 2 MG PO TABS: 2.0000 mg | ORAL_TABLET | Freq: Every day | ORAL | 1 refills | Status: DC

## 2019-10-06 ENCOUNTER — Other Ambulatory Visit: Payer: Self-pay | Admitting: Family

## 2019-10-06 ENCOUNTER — Other Ambulatory Visit: Payer: Self-pay

## 2019-10-06 DIAGNOSIS — R42 Dizziness and giddiness: Secondary | ICD-10-CM

## 2019-10-06 DIAGNOSIS — E119 Type 2 diabetes mellitus without complications: Secondary | ICD-10-CM

## 2019-10-06 MED ORDER — ONETOUCH VERIO VI STRP: ORAL_STRIP | 11 refills | Status: DC

## 2019-10-07 ENCOUNTER — Other Ambulatory Visit: Payer: Self-pay | Admitting: *Deleted

## 2019-10-07 DIAGNOSIS — E119 Type 2 diabetes mellitus without complications: Secondary | ICD-10-CM

## 2019-10-07 MED ORDER — ONETOUCH VERIO VI STRP: ORAL_STRIP | 3 refills | Status: DC

## 2019-10-11 ENCOUNTER — Other Ambulatory Visit: Payer: Self-pay | Admitting: *Deleted

## 2019-10-11 DIAGNOSIS — E119 Type 2 diabetes mellitus without complications: Secondary | ICD-10-CM

## 2019-10-11 MED ORDER — ONETOUCH DELICA PLUS LANCET33G MISC: 1.0000 | Freq: Every day | 3 refills | Status: DC

## 2019-10-17 ENCOUNTER — Other Ambulatory Visit: Payer: Self-pay | Admitting: Family

## 2019-10-17 ENCOUNTER — Other Ambulatory Visit: Payer: Self-pay

## 2019-10-17 DIAGNOSIS — R42 Dizziness and giddiness: Secondary | ICD-10-CM

## 2019-10-18 ENCOUNTER — Other Ambulatory Visit: Payer: Self-pay | Admitting: Family

## 2019-10-18 DIAGNOSIS — R42 Dizziness and giddiness: Secondary | ICD-10-CM

## 2019-10-18 MED ORDER — MECLIZINE HCL 25 MG PO TABS: 25.0000 mg | ORAL_TABLET | Freq: Two times a day (BID) | ORAL | 0 refills | Status: DC | PRN

## 2019-10-19 ENCOUNTER — Other Ambulatory Visit: Payer: Self-pay | Admitting: Family

## 2019-10-19 DIAGNOSIS — R42 Dizziness and giddiness: Secondary | ICD-10-CM

## 2019-10-19 MED ORDER — MECLIZINE HCL 25 MG PO TABS: 25.0000 mg | ORAL_TABLET | Freq: Two times a day (BID) | ORAL | 3 refills | Status: DC | PRN

## 2019-10-28 DIAGNOSIS — L84 Corns and callosities: Secondary | ICD-10-CM | POA: Diagnosis not present

## 2019-10-28 DIAGNOSIS — M79676 Pain in unspecified toe(s): Secondary | ICD-10-CM | POA: Diagnosis not present

## 2019-10-28 DIAGNOSIS — E1142 Type 2 diabetes mellitus with diabetic polyneuropathy: Secondary | ICD-10-CM | POA: Diagnosis not present

## 2019-10-28 DIAGNOSIS — B351 Tinea unguium: Secondary | ICD-10-CM | POA: Diagnosis not present

## 2019-10-30 ENCOUNTER — Other Ambulatory Visit: Payer: Self-pay | Admitting: Family

## 2019-10-30 DIAGNOSIS — R42 Dizziness and giddiness: Secondary | ICD-10-CM

## 2019-11-01 ENCOUNTER — Ambulatory Visit (INDEPENDENT_AMBULATORY_CARE_PROVIDER_SITE_OTHER): Payer: Medicare Other | Admitting: Nurse Practitioner

## 2019-11-03 NOTE — Progress Notes (Signed)
Radiation Oncology         [phone] ________________________________  Name: Alexandria Irwin MRN: [id number]  Date: 11/04/2019  DOB: 01-28-1946  Follow-Up Visit Note  CC: Alexandria Balloon, FNP  Alexandria Amber, MD    ICD-10-CM   1. 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  Radiation treatment dates:09/18/2017 - 10/13/2017  Site/dose:Vaginal cuff / 30 Gy in 5 fractions  Narrative:  The patient returns today for routine follow-up. She last saw Dr. Denman George on 07/29/2019, during which time she was noted to have a complete clinical response.  Of note, the patient was seen in the ED on 07/02/2019 for a closed fracture of the proximal end of the right humerus s/p fall. She has also been seeing PT for right shoulder pain.  On review of systems, she reports continued discomfort in the right arm and shoulder area.  She reports not requiring a cast but the sling.. She denies pelvic pain abdominal bloating vaginal bleeding or discharge.  She denies any hematuria or rectal bleeding..  ALLERGIES:  is allergic to ace inhibitors; ciprocin-fluocin-procin [fluocinolone]; norvasc [amlodipine]; latex; neosporin [neomycin-bacitracin zn-polymyx]; and sulfa antibiotics.  Meds: Current Outpatient Medications  Medication Sig Dispense Refill  . acetaminophen (TYLENOL) 500 MG tablet Take 1,000 mg by mouth every 8 (eight) hours as needed for mild pain or headache.    Marland Kitchen aspirin EC 81 MG tablet Take 81 mg by mouth daily.    . Calcium Citrate-Vitamin D ([employer] CALCIUM CITRATE +D PO) Take 2 tablets by mouth every evening.     . Cholecalciferol (D 2000) 2000 units TABS Take 2,000 Units by mouth daily.    . cycloSPORINE (RESTASIS) 0.05 % ophthalmic emulsion Place 1 drop into both eyes 2 (two) times daily.     . furosemide (LASIX) 20  MG tablet TAKE 1 TABLET BY MOUTH EVERY DAY 30 tablet 1  . glimepiride (AMARYL) 2 MG tablet Take 1 tablet (2 mg total) by mouth daily with breakfast. 90 tablet 1  . glucose blood (ONETOUCH VERIO) test strip Test BS daily Dx E11.9 100 each 3  . Lancets (ONETOUCH DELICA PLUS [id number]) MISC 1 each by Other route daily. Dx E11.9 100 each 3  . levothyroxine (SYNTHROID) 88 MCG tablet Take 1 tablet (88 mcg total) by mouth daily. 90 tablet 3  . losartan (COZAAR) 100 MG tablet Take 1 tablet (100 mg total) by mouth daily. 90 tablet 0  . Magnesium 250 MG TABS Take 250 mg by mouth every evening.     . meclizine (ANTIVERT) 25 MG tablet Take 1 tablet (25 mg total) by mouth 2 (two) times daily as needed for dizziness. 60 tablet 3  . metFORMIN (GLUCOPHAGE XR) 500 MG 24 hr tablet Take 2 tablets (1,000 mg total) by mouth daily with breakfast. 180 tablet 1  . metoprolol succinate (TOPROL-XL) 50 MG 24 hr tablet TAKE 1 TABLET BY MOUTH TWICE A DAY 180 tablet 0  . Omega-3 Fatty Acids (FISH OIL) 1000 MG CAPS Take 1,000 mg by mouth every evening.    . simvastatin (ZOCOR) 40 MG tablet TAKE 1 TABLET BY MOUTH ONCE DAILY AT  6PM 90 tablet 0  . vitamin C (ASCORBIC ACID) 500 MG tablet Take 500 mg by mouth daily.    . cyanocobalamin (,VITAMIN B-12,) 1000 MCG/ML injection Inject 1,000  mcg into the muscle every 30 (thirty) days.      No current facility-administered medications for this encounter.    Physical Findings: The patient is in no acute distress. Patient is alert and oriented.  height is '5\' 1"'  (1.549 m) and weight is 180 lb 12.8 oz (82 kg). Her temperature is 98.5 F (36.9 C). Her blood pressure is 130/68 and her pulse is 58 (abnormal). Her respiration is 20 and oxygen saturation is 99%.   Lungs are clear to auscultation bilaterally. Heart has regular rate and rhythm. No palpable cervical, supraclavicular, or axillary adenopathy. Abdomen soft, non-tender, normal bowel sounds.  On pelvic examination the external  genitalia were unremarkable. A speculum exam was performed. There are no mucosal lesions noted in the vaginal vault. Vaginal cuff intact. On bimanual  examination there were no pelvic masses appreciated.  Some radiation changes noted along the vaginal cuff.   Lab Findings: Lab Results  Component Value Date   WBC 5.6 09/14/2019   HGB 12.1 09/14/2019   HCT 37.6 09/14/2019   MCV 95 09/14/2019   PLT 178 09/14/2019                                                               Radiographic Findings: No results found.  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  No apparent chronic toxicities from her surgery or postop radiation therapy  Plan:  The patient will follow-up with Dr. Denman George in 6 months and with radiation oncology in 1 year as the patient is now 2 years out from her treatment. ____________________________________  Blair Promise, PhD, MD  This document serves as a record of services personally performed by Gery Pray, MD. It was created on his behalf by Clerance Lav, a trained medical scribe. The creation of this record is based on the scribe's personal observations and the provider's statements to them. This document has been checked and approved by the attending provider.

## 2019-11-04 ENCOUNTER — Encounter: Payer: Self-pay | Admitting: Radiation Oncology

## 2019-11-04 ENCOUNTER — Other Ambulatory Visit: Payer: Self-pay

## 2019-11-04 ENCOUNTER — Ambulatory Visit
Admission: RE | Admit: 2019-11-04 | Discharge: 2019-11-04 | Disposition: A | Discharge status: Home / Self Care | Payer: Medicare Other | Source: Ambulatory Visit | Attending: Radiation Oncology | Admitting: Radiation Oncology

## 2019-11-04 VITALS — BP 130/68 | HR 58 | Temp 98.5°F | Resp 20 | Ht 61.0 in | Wt 180.8 lb

## 2019-11-04 DIAGNOSIS — Z8542 Personal history of malignant neoplasm of other parts of uterus: Secondary | ICD-10-CM | POA: Diagnosis not present

## 2019-11-04 DIAGNOSIS — Z923 Personal history of irradiation: Secondary | ICD-10-CM | POA: Insufficient documentation

## 2019-11-04 DIAGNOSIS — Z7984 Long term (current) use of oral hypoglycemic drugs: Secondary | ICD-10-CM | POA: Diagnosis not present

## 2019-11-04 DIAGNOSIS — M25511 Pain in right shoulder: Secondary | ICD-10-CM | POA: Diagnosis not present

## 2019-11-04 DIAGNOSIS — Z7982 Long term (current) use of aspirin: Secondary | ICD-10-CM | POA: Diagnosis not present

## 2019-11-04 DIAGNOSIS — Z79899 Other long term (current) drug therapy: Secondary | ICD-10-CM | POA: Insufficient documentation

## 2019-11-04 DIAGNOSIS — C541 Malignant neoplasm of endometrium: Secondary | ICD-10-CM

## 2019-11-04 DIAGNOSIS — Z08 Encounter for follow-up examination after completed treatment for malignant neoplasm: Secondary | ICD-10-CM | POA: Diagnosis not present

## 2019-11-04 NOTE — Progress Notes (Signed)
Alexandria Irwin presents today for follow up after completing vaginal brachytherapy on 10/13/2017  Pain No Fatigue occasional mild fatigue N/V/D no Bladder issues No Vaginal or rectal bleeding No Skin No problems Using dilators: using once a week, does she need to continue this?  Other notable issues, if any: Last saw Dr. Everitt Amber on 07/29/2019: "Complete clinical response." Ms. Blanch Media F/U with Dr. Denman George again in 3 months.    Vitals:   11/04/19 1115  BP: 130/68  Pulse: (!) 58  Temp: 98.5 F (36.9 C)  Resp: 20  Height: 5\' 1"  (1.549 m)  Weight: 180 lb 12.8 oz (82 kg)  SpO2: 99%  BMI (Calculated): 34.18

## 2019-11-06 ENCOUNTER — Other Ambulatory Visit: Payer: Self-pay | Admitting: Family

## 2019-11-18 ENCOUNTER — Ambulatory Visit (INDEPENDENT_AMBULATORY_CARE_PROVIDER_SITE_OTHER): Payer: Medicare Other | Admitting: Gastroenterology

## 2019-12-13 ENCOUNTER — Other Ambulatory Visit: Payer: Self-pay | Admitting: Family

## 2019-12-27 ENCOUNTER — Telehealth: Payer: Self-pay | Admitting: Family

## 2019-12-27 DIAGNOSIS — E559 Vitamin D deficiency, unspecified: Secondary | ICD-10-CM

## 2019-12-27 DIAGNOSIS — E1159 Type 2 diabetes mellitus with other circulatory complications: Secondary | ICD-10-CM

## 2019-12-27 DIAGNOSIS — C541 Malignant neoplasm of endometrium: Secondary | ICD-10-CM

## 2019-12-27 DIAGNOSIS — E1169 Type 2 diabetes mellitus with other specified complication: Secondary | ICD-10-CM

## 2019-12-27 DIAGNOSIS — E538 Deficiency of other specified B group vitamins: Secondary | ICD-10-CM

## 2019-12-27 DIAGNOSIS — E039 Hypothyroidism, unspecified: Secondary | ICD-10-CM

## 2019-12-27 NOTE — Telephone Encounter (Signed)
WHAT LABS WOULD YOU LIKE ORDERED?

## 2019-12-27 NOTE — Telephone Encounter (Signed)
Patient aware and verbalized understanding. °

## 2019-12-27 NOTE — Telephone Encounter (Signed)
Labs ordered.

## 2019-12-29 ENCOUNTER — Encounter (INDEPENDENT_AMBULATORY_CARE_PROVIDER_SITE_OTHER): Payer: Self-pay | Admitting: Gastroenterology

## 2019-12-29 ENCOUNTER — Encounter (INDEPENDENT_AMBULATORY_CARE_PROVIDER_SITE_OTHER): Payer: Self-pay | Admitting: *Deleted

## 2019-12-29 ENCOUNTER — Other Ambulatory Visit: Payer: Self-pay

## 2019-12-29 ENCOUNTER — Ambulatory Visit (INDEPENDENT_AMBULATORY_CARE_PROVIDER_SITE_OTHER): Payer: Medicare Other | Admitting: Gastroenterology

## 2019-12-29 VITALS — BP 117/77 | HR 70 | Temp 98.2°F | Ht 61.0 in | Wt 180.6 lb

## 2019-12-29 DIAGNOSIS — E538 Deficiency of other specified B group vitamins: Secondary | ICD-10-CM

## 2019-12-29 DIAGNOSIS — K7460 Unspecified cirrhosis of liver: Secondary | ICD-10-CM

## 2019-12-29 NOTE — Progress Notes (Signed)
Patient profile: Alexandria Irwin is a 74 y.o. female seen for evaluation of cirrhosis - last seen 09/2018     History of Present Illness: Alexandria Irwin is seen today for follow-up of cirrhosis-she reports over the past year doing remarkably well.  She has diarrhea approximately once a month which is responsive to Imodium.  She denies any constipation, abdominal pain, blood in stool.  No GERD symptoms, nausea, vomiting, dysphagia.  Appetite good.  Weight fairly stable, up from 150s in 2018 when she was on chemotherapy for endometrial cancer.  Completed chemotherapy June 2019.  Daughter accompanies helps with history.  Denies any history of heavy alcohol intake.  She does not currently drink at all.  No known family history of liver disease.  Denies family history of colon polyps or colon cancer as well.  Wt Readings from Last 3 Encounters:  12/29/19 180 lb 9.6 oz (81.9 kg)  11/04/19 180 lb 12.8 oz (82 kg)  09/14/19 183 lb 12.8 oz (83.4 kg)     Last Colonoscopy: None prior Last Endoscopy: None prior   Past Medical History:  Past Medical History:  Diagnosis Date  . Cataract   . Endometrial adenocarcinoma ([place])   . Environmental and seasonal allergies   . Fall 07/2019  . Family history of ovarian cancer   . Family history of pancreatic cancer   . Fx anat neck humerus-open 07/2019  . Genetic testing 09/19/2017   Multi-Cancer panel (83 genes) @ [facility] - No pathogenic mutations detected  . GERD (gastroesophageal reflux disease)   . History of DVT of lower extremity    early 2000s--- lower right leg  . Hyperlipidemia   . Hypertension   . Hypothyroidism   . Type 2 diabetes mellitus ([place])    followed by pcp  . Umbilical hernia   . Vitamin B 12 deficiency   . Vitamin D deficiency   . Wears glasses     Problem List: Patient Active Problem List   Diagnosis Date Noted  . Pancytopenia, acquired ([place]) 12/16/2017  . Anemia due to antineoplastic chemotherapy 11/26/2017  . Peripheral  neuropathy due to chemotherapy ([place]) 10/14/2017  . Genetic testing 09/19/2017  . Family history of ovarian cancer   . Family history of pancreatic cancer   . Obesity (BMI 30-39.9) 08/26/2017  . Easy bruising 08/18/2017  . 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 Endometrial cancer ([place]) 08/05/2017  . B12 deficiency 06/26/2017  . Endometrial adenocarcinoma ([place]) 06/25/2017  . Umbilical hernia without obstruction and without gangrene 06/25/2017  . Vitamin D deficiency   . Thrombophlebitis leg 09/18/2013  . Hypertension associated with diabetes ([place]) 12/03/2012  . Diabetes ([place]) 12/03/2012  . Hypothyroidism 12/03/2012  . Hyperlipidemia associated with type 2 diabetes mellitus ([place]) 12/03/2012    Past Surgical History: Past Surgical History:  Procedure Laterality Date  . BREAST BIOPSY Left 2009   benign  . CATARACT EXTRACTION W/ INTRAOCULAR LENS IMPLANT Left 2018  . CATARACT EXTRACTION W/PHACO Right 03/14/2014   Procedure: CATARACT EXTRACTION PHACO AND INTRAOCULAR LENS PLACEMENT (IOC);  Surgeon: Tonny Branch, MD;  Location: [facility];  Service: Ophthalmology;  Laterality: Right;  CDE: 8.34  . FOOT GANGLION EXCISION Right 2013  . HYSTEROSCOPY WITH D & C N/A 06/13/2017   Procedure: DILATATION AND CURETTAGE /HYSTEROSCOPY;  Surgeon: Alden Hipp, MD;  Location: [facility];  Service: Gynecology;  Laterality: N/A;  . IR FLUORO GUIDE PORT INSERTION RIGHT  08/27/2017  . IR REMOVAL TUN ACCESS W/ PORT  W/O FL MOD SED  04/27/2018  . IR US GUIDE VASC ACCESS RIGHT  08/27/2017  . LAPAROSCOPIC CHOLECYSTECTOMY  2005  . ROBOTIC ASSISTED TOTAL HYSTERECTOMY WITH BILATERAL SALPINGO OOPHERECTOMY Bilateral 08/05/2017   Procedure: ROBOTIC ASSISTED TOTAL HYSTERECTOMY WITH BILATERAL SALPINGO OOPHORECTOMY;  Surgeon: Everitt Amber, MD;  Location: [facility];  Service: Gynecology;  Laterality: Bilateral;  . SENTINEL NODE BIOPSY Bilateral 08/05/2017   Procedure: SENTINEL NODE BIOPSY;  Surgeon: Everitt Amber, MD;   Location: [facility];  Service: Gynecology;  Laterality: Bilateral;  . UMBILICAL HERNIA REPAIR N/A 08/05/2017   Procedure: HERNIA REPAIR UMBILICAL ADULT;  Surgeon: Everitt Amber, MD;  Location: [facility];  Service: Gynecology;  Laterality: N/A;  . VEIN SURGERY Right 2011   vein stripping-leg  and laser treatment    Allergies: Allergies  Allergen Reactions  . Ace Inhibitors Cough  . Ciprocin-Fluocin-Procin [Fluocinolone] Hives  . Norvasc [Amlodipine] Cough  . Latex Rash  . Neosporin [Neomycin-Bacitracin Zn-Polymyx] Rash  . Sulfa Antibiotics Rash      Home Medications:  Current Outpatient Medications:  .  acetaminophen (TYLENOL) 500 MG tablet, Take 1,000 mg by mouth every 8 (eight) hours as needed for mild pain or headache., Disp: , Rfl:  .  aspirin EC 81 MG tablet, Take 81 mg by mouth daily., Disp: , Rfl:  .  Calcium Citrate-Vitamin D ([employer] CALCIUM CITRATE +D PO), Take 2 tablets by mouth every evening. , Disp: , Rfl:  .  Cholecalciferol (D 2000) 2000 units TABS, Take 2,000 Units by mouth daily., Disp: , Rfl:  .  cycloSPORINE (RESTASIS) 0.05 % ophthalmic emulsion, Place 1 drop into both eyes 2 (two) times daily. , Disp: , Rfl:  .  furosemide (LASIX) 20 MG tablet, TAKE 1 TABLET BY MOUTH EVERY DAY, Disp: 30 tablet, Rfl: 1 .  glimepiride (AMARYL) 2 MG tablet, Take 1 tablet (2 mg total) by mouth daily with breakfast., Disp: 90 tablet, Rfl: 1 .  glucose blood (ONETOUCH VERIO) test strip, Test BS daily Dx E11.9, Disp: 100 each, Rfl: 3 .  Lancets (ONETOUCH DELICA PLUS [id number]) MISC, 1 each by Other route daily. Dx E11.9, Disp: 100 each, Rfl: 3 .  levothyroxine (SYNTHROID) 88 MCG tablet, Take 1 tablet (88 mcg total) by mouth daily., Disp: 90 tablet, Rfl: 3 .  losartan (COZAAR) 100 MG tablet, Take 1 tablet (100 mg total) by mouth daily. (Needs to be seen before next refill), Disp: 30 tablet, Rfl: 0 .  Magnesium 250 MG TABS, Take 250 mg by mouth every evening. , Disp: , Rfl:  .  meclizine (ANTIVERT) 25 MG  tablet, Take 1 tablet (25 mg total) by mouth 2 (two) times daily as needed for dizziness., Disp: 60 tablet, Rfl: 3 .  metFORMIN (GLUCOPHAGE XR) 500 MG 24 hr tablet, Take 2 tablets (1,000 mg total) by mouth daily with breakfast., Disp: 180 tablet, Rfl: 1 .  metoprolol succinate (TOPROL-XL) 50 MG 24 hr tablet, TAKE 1 TABLET BY MOUTH TWICE A DAY, Disp: 180 tablet, Rfl: 0 .  Omega-3 Fatty Acids (FISH OIL) 1000 MG CAPS, Take 1,000 mg by mouth every evening., Disp: , Rfl:  .  simvastatin (ZOCOR) 40 MG tablet, TAKE 1 TABLET BY MOUTH ONCE DAILY AT 6PM, Disp: 90 tablet, Rfl: 0 .  vitamin C (ASCORBIC ACID) 500 MG tablet, Take 500 mg by mouth daily., Disp: , Rfl:    Family History: family history includes Cancer (age of onset: 82) in an other family member; Ovarian cancer in her mother; Pancreatic cancer (  age of onset: 95) in her mother.    Social History:   reports that she has never smoked. She has never used smokeless tobacco. She reports that she does not drink alcohol and does not use drugs.   Review of Systems: Constitutional: Denies weight loss/weight gain  Eyes: No changes in vision. ENT: No oral lesions, sore throat.  GI: see HPI.  Heme/Lymph: No easy bruising.  CV: No chest pain.  GU: No hematuria.  Integumentary: No rashes.  Neuro: No headaches.  Psych: No depression/anxiety.  Endocrine: No heat/cold intolerance.  Allergic/Immunologic: No urticaria.  Resp: No cough, SOB.  Musculoskeletal: No joint swelling.    Physical Examination: BP 117/77 (BP Location: Left Arm, Patient Position: Sitting, Cuff Size: Normal)   Pulse 70   Temp 98.2 F (36.8 C) (Oral)   Ht 5\' 1"  (1.549 m)   Wt 180 lb 9.6 oz (81.9 kg)   BMI 34.12 kg/m  Gen: NAD, alert and oriented x 4, overweight. HEENT: PEERLA, EOMI, Neck: supple, no JVD Chest: CTA bilaterally, no wheezes, crackles, or other adventitious sounds CV: RRR, no m/g/c/r Abd: soft, NT, ND, +BS in all four quadrants; no HSM, guarding, ridigity, or  rebound tenderness Ext: no edema, well perfused with 2+ pulses, Skin: no rash or lesions noted on observed skin Lymph: no noted LAD  Data Reviewed:   07/2019--TSH, CBC normal, CMP w/ LFTS normal. Albumin normal. Platelet normal.   05/2018---ANA negative, anti-smooth muscle Ab, ferritin normal, IgG normal. Iron and TIBC normal. Viral hepatitis labs negative   05/2018--Status post cholecystectomy. Mildly prominent common bile duct, up to 9 mm, but similar to recent CT, not felt to represent obstruction. Hepatic changes consistent with the clinical history of cirrhosis. No focal lesion.  Assessment/Plan: Ms. Nogueira is a 74 y.o. female    Emmersen was seen today for follow-up.  Diagnoses and all orders for this visit:  Cirrhosis of liver without ascites, unspecified hepatic cirrhosis type ([place]) -     AFP tumor marker -     INR/PT -     US Abdomen Limited RUQ; Future -     CBC with Differential -     B12 and Folate Panel -     COMPLETE METABOLIC PANEL WITH GFR -     US Abdomen Complete; Future  B12 deficiency -     B12 and Folate Panel    1.  Cirrhosis-felt related to nonalcoholic fatty liver disease, autoimmune and viral hepatitis work-up in 2019 was normal.  She has not had any imaging or an AFP since 2019 and will order today.  We will repeat her LFTs as well as check an INR.  She has never had a endoscopy and would consider for varices screening in future.  2.  History of B12 deficiency-has been on B12 injections in the past.  Daughter currently endorses that she is falling asleep extremely easily which has been an issue when was low in B12 in the past.  We will repeat labs today.  Her last CBC had normal MCV without anemia.  3. Colon cancer screening - declines, none prior, denies family hx colon polyps/colon cancer.   Will need f/up in 6 months.    I personally performed the service, non-incident to. (WP)  Laurine Blazer, [facility]  [facility]

## 2019-12-29 NOTE — Patient Instructions (Signed)
We are checking labs for evaluation and scheduling ultrasound

## 2019-12-30 LAB — PROTIME-INR
INR: 1
Prothrombin Time: 11 s (ref 9.0–11.5)

## 2019-12-30 LAB — COMPLETE METABOLIC PANEL WITH GFR
AG Ratio: 1.7 (calc) (ref 1.0–2.5)
ALT: 22 U/L (ref 6–29)
AST: 23 U/L (ref 10–35)
Albumin: 4.3 g/dL (ref 3.6–5.1)
Alkaline phosphatase (APISO): 38 U/L (ref 37–153)
BUN/Creatinine Ratio: 18 (calc) (ref 6–22)
BUN: 19 mg/dL (ref 7–25)
CO2: 30 mmol/L (ref 20–32)
Calcium: 10.3 mg/dL (ref 8.6–10.4)
Chloride: 101 mmol/L (ref 98–110)
Creat: 1.05 mg/dL — ABNORMAL HIGH (ref 0.60–0.93)
GFR, Est African American: 60 mL/min/{1.73_m2} (ref >=60)
GFR, Est Non African American: 52 mL/min/{1.73_m2} — ABNORMAL LOW (ref >=60)
Globulin: 2.6 g/dL (calc) (ref 1.9–3.7)
Glucose, Bld: 167 mg/dL — ABNORMAL HIGH (ref 65–139)
Potassium: 4.3 mmol/L (ref 3.5–5.3)
Sodium: 140 mmol/L (ref 135–146)
Total Bilirubin: 0.7 mg/dL (ref 0.2–1.2)
Total Protein: 6.9 g/dL (ref 6.1–8.1)

## 2019-12-30 LAB — CBC WITH DIFFERENTIAL/PLATELET
Absolute Monocytes: 397 cells/uL (ref 200–950)
Basophils Absolute: 31 cells/uL (ref 0–200)
Basophils Relative: 0.5 %
Eosinophils Absolute: 242 cells/uL (ref 15–500)
Eosinophils Relative: 3.9 %
HCT: 38.2 % (ref 35.0–45.0)
Hemoglobin: 12.6 g/dL (ref 11.7–15.5)
Lymphs Abs: 2480 cells/uL (ref 850–3900)
MCH: 31.4 pg (ref 27.0–33.0)
MCHC: 33 g/dL (ref 32.0–36.0)
MCV: 95.3 fL (ref 80.0–100.0)
MPV: 10.4 fL (ref 7.5–12.5)
Monocytes Relative: 6.4 %
Neutro Abs: 3050 cells/uL (ref 1500–7800)
Neutrophils Relative %: 49.2 %
Platelets: 167 10*3/uL (ref 140–400)
RBC: 4.01 10*6/uL (ref 3.80–5.10)
RDW: 13.7 % (ref 11.0–15.0)
Total Lymphocyte: 40 %
WBC: 6.2 10*3/uL (ref 3.8–10.8)

## 2019-12-30 LAB — B12 AND FOLATE PANEL
Folate: 15.3 ng/mL
Vitamin B-12: 371 pg/mL (ref 200–1100)

## 2019-12-30 LAB — AFP TUMOR MARKER: AFP-Tumor Marker: 2.1 ng/mL

## 2020-01-05 ENCOUNTER — Other Ambulatory Visit: Payer: Self-pay

## 2020-01-05 ENCOUNTER — Ambulatory Visit (HOSPITAL_COMMUNITY)
Admission: RE | Admit: 2020-01-05 | Discharge: 2020-01-05 | Disposition: A | Discharge status: Home / Self Care | Payer: Medicare Other | Source: Ambulatory Visit | Attending: Gastroenterology | Admitting: Gastroenterology

## 2020-01-05 DIAGNOSIS — Z9049 Acquired absence of other specified parts of digestive tract: Secondary | ICD-10-CM | POA: Diagnosis not present

## 2020-01-05 DIAGNOSIS — K7460 Unspecified cirrhosis of liver: Secondary | ICD-10-CM

## 2020-01-05 DIAGNOSIS — Q600 Renal agenesis, unilateral: Secondary | ICD-10-CM | POA: Diagnosis not present

## 2020-01-05 DIAGNOSIS — I714 Abdominal aortic aneurysm, without rupture: Secondary | ICD-10-CM | POA: Diagnosis not present

## 2020-01-07 ENCOUNTER — Other Ambulatory Visit: Payer: Self-pay

## 2020-01-07 ENCOUNTER — Other Ambulatory Visit: Payer: Medicare Other

## 2020-01-07 DIAGNOSIS — E039 Hypothyroidism, unspecified: Secondary | ICD-10-CM

## 2020-01-07 DIAGNOSIS — E785 Hyperlipidemia, unspecified: Secondary | ICD-10-CM

## 2020-01-07 DIAGNOSIS — E1159 Type 2 diabetes mellitus with other circulatory complications: Secondary | ICD-10-CM

## 2020-01-07 DIAGNOSIS — C541 Malignant neoplasm of endometrium: Secondary | ICD-10-CM

## 2020-01-07 DIAGNOSIS — E538 Deficiency of other specified B group vitamins: Secondary | ICD-10-CM

## 2020-01-07 DIAGNOSIS — E559 Vitamin D deficiency, unspecified: Secondary | ICD-10-CM

## 2020-01-07 DIAGNOSIS — E1169 Type 2 diabetes mellitus with other specified complication: Secondary | ICD-10-CM

## 2020-01-08 ENCOUNTER — Other Ambulatory Visit: Payer: Self-pay | Admitting: Family

## 2020-01-08 LAB — TSH: TSH: 2.68 u[IU]/mL (ref 0.450–4.500)

## 2020-01-08 LAB — VITAMIN D 25 HYDROXY (VIT D DEFICIENCY, FRACTURES): Vit D, 25-Hydroxy: 29.4 ng/mL — ABNORMAL LOW (ref 30.0–100.0)

## 2020-01-08 LAB — ANEMIA PROFILE B
Basophils Absolute: 0 10*3/uL (ref 0.0–0.2)
Basos: 1 %
EOS (ABSOLUTE): 0.2 10*3/uL (ref 0.0–0.4)
Eos: 3 %
Ferritin: 14 ng/mL — ABNORMAL LOW (ref 15–150)
Folate: 17 ng/mL (ref >3.0)
Hematocrit: 40.7 % (ref 34.0–46.6)
Hemoglobin: 13.2 g/dL (ref 11.1–15.9)
Immature Grans (Abs): 0 10*3/uL (ref 0.0–0.1)
Immature Granulocytes: 0 %
Iron Saturation: 25 % (ref 15–55)
Iron: 100 ug/dL (ref 27–139)
Lymphocytes Absolute: 2.6 10*3/uL (ref 0.7–3.1)
Lymphs: 45 %
MCH: 31.3 pg (ref 26.6–33.0)
MCHC: 32.4 g/dL (ref 31.5–35.7)
MCV: 96 fL (ref 79–97)
Monocytes Absolute: 0.4 10*3/uL (ref 0.1–0.9)
Monocytes: 6 %
Neutrophils Absolute: 2.6 10*3/uL (ref 1.4–7.0)
Neutrophils: 45 %
Platelets: 208 10*3/uL (ref 150–450)
RBC: 4.22 x10E6/uL (ref 3.77–5.28)
RDW: 14.1 % (ref 11.7–15.4)
Retic Ct Pct: 2 % (ref 0.6–2.6)
Total Iron Binding Capacity: 398 ug/dL (ref 250–450)
UIBC: 298 ug/dL (ref 118–369)
Vitamin B-12: 470 pg/mL (ref 232–1245)
WBC: 5.9 10*3/uL (ref 3.4–10.8)

## 2020-01-08 LAB — CMP14+EGFR
ALT: 21 IU/L (ref 0–32)
AST: 23 IU/L (ref 0–40)
Albumin/Globulin Ratio: 1.9 (ref 1.2–2.2)
Albumin: 4.5 g/dL (ref 3.7–4.7)
Alkaline Phosphatase: 47 IU/L — ABNORMAL LOW (ref 48–121)
BUN/Creatinine Ratio: 17 (ref 12–28)
BUN: 17 mg/dL (ref 8–27)
Bilirubin Total: 0.7 mg/dL (ref 0.0–1.2)
CO2: 25 mmol/L (ref 20–29)
Calcium: 9.6 mg/dL (ref 8.7–10.3)
Chloride: 98 mmol/L (ref 96–106)
Creatinine, Ser: 1.02 mg/dL — ABNORMAL HIGH (ref 0.57–1.00)
GFR calc Af Amer: 62 mL/min/{1.73_m2} (ref >59)
GFR calc non Af Amer: 54 mL/min/{1.73_m2} — ABNORMAL LOW (ref >59)
Globulin, Total: 2.4 g/dL (ref 1.5–4.5)
Glucose: 268 mg/dL — ABNORMAL HIGH (ref 65–99)
Potassium: 5 mmol/L (ref 3.5–5.2)
Sodium: 137 mmol/L (ref 134–144)
Total Protein: 6.9 g/dL (ref 6.0–8.5)

## 2020-01-10 ENCOUNTER — Encounter: Payer: Self-pay | Admitting: Family

## 2020-01-10 ENCOUNTER — Other Ambulatory Visit: Payer: Self-pay

## 2020-01-10 ENCOUNTER — Ambulatory Visit (INDEPENDENT_AMBULATORY_CARE_PROVIDER_SITE_OTHER): Payer: Medicare Other | Admitting: Family

## 2020-01-10 VITALS — BP 120/67 | HR 54 | Temp 97.3°F | Ht 61.0 in | Wt 178.4 lb

## 2020-01-10 DIAGNOSIS — I152 Hypertension secondary to endocrine disorders: Secondary | ICD-10-CM

## 2020-01-10 DIAGNOSIS — R42 Dizziness and giddiness: Secondary | ICD-10-CM

## 2020-01-10 DIAGNOSIS — E039 Hypothyroidism, unspecified: Secondary | ICD-10-CM | POA: Diagnosis not present

## 2020-01-10 DIAGNOSIS — E669 Obesity, unspecified: Secondary | ICD-10-CM

## 2020-01-10 DIAGNOSIS — E1169 Type 2 diabetes mellitus with other specified complication: Secondary | ICD-10-CM

## 2020-01-10 DIAGNOSIS — E1159 Type 2 diabetes mellitus with other circulatory complications: Secondary | ICD-10-CM | POA: Diagnosis not present

## 2020-01-10 DIAGNOSIS — C541 Malignant neoplasm of endometrium: Secondary | ICD-10-CM

## 2020-01-10 DIAGNOSIS — E559 Vitamin D deficiency, unspecified: Secondary | ICD-10-CM

## 2020-01-10 DIAGNOSIS — E785 Hyperlipidemia, unspecified: Secondary | ICD-10-CM

## 2020-01-10 DIAGNOSIS — D6481 Anemia due to antineoplastic chemotherapy: Secondary | ICD-10-CM

## 2020-01-10 DIAGNOSIS — E538 Deficiency of other specified B group vitamins: Secondary | ICD-10-CM

## 2020-01-10 DIAGNOSIS — T451X5A Adverse effect of antineoplastic and immunosuppressive drugs, initial encounter: Secondary | ICD-10-CM

## 2020-01-10 DIAGNOSIS — I10 Essential (primary) hypertension: Secondary | ICD-10-CM

## 2020-01-10 LAB — BAYER DCA HB A1C WAIVED: HB A1C (BAYER DCA - WAIVED): 10.5 % — ABNORMAL HIGH (ref <7.0)

## 2020-01-10 MED ORDER — VITAMIN D (ERGOCALCIFEROL) 1.25 MG (50000 UNIT) PO CAPS: 50000.0000 [IU] | ORAL_CAPSULE | ORAL | 3 refills | Status: DC

## 2020-01-10 MED ORDER — GLIMEPIRIDE 4 MG PO TABS: 4.0000 mg | ORAL_TABLET | Freq: Every day | ORAL | 5 refills | Status: DC

## 2020-01-10 MED ORDER — METOPROLOL SUCCINATE ER 50 MG PO TB24: 50.0000 mg | ORAL_TABLET | Freq: Two times a day (BID) | ORAL | 2 refills | Status: DC

## 2020-01-10 MED ORDER — METFORMIN HCL ER 500 MG PO TB24: 1000.0000 mg | ORAL_TABLET | Freq: Every day | ORAL | 1 refills | Status: DC

## 2020-01-10 MED ORDER — FUROSEMIDE 20 MG PO TABS: 20.0000 mg | ORAL_TABLET | Freq: Every day | ORAL | 1 refills | Status: DC

## 2020-01-10 MED ORDER — LOSARTAN POTASSIUM 100 MG PO TABS: 100.0000 mg | ORAL_TABLET | Freq: Every day | ORAL | 2 refills | Status: DC

## 2020-01-10 NOTE — Progress Notes (Signed)
Subjective:    Patient ID: Alexandria Irwin, female    DOB: [birth year], 74 y.o.   MRN: [id number]  Chief Complaint  Patient presents with  . Medical Management of Chronic Issues  . Depression  . Diabetes   Pt presents to the office today for chronic follow up. [name] Endometrial Adenocarcinomaand followed by Oncologists every 6 months. She has completed chemo and radiation.She is followed by GI for cirrhosis.  Diabetes She presents for her follow-up diabetic visit. She has type 2 diabetes mellitus. There are no hypoglycemic associated symptoms. Pertinent negatives for hypoglycemia include no nervousness/anxiousness. Associated symptoms include fatigue and visual change. Pertinent negatives for diabetes include no blurred vision and no foot paresthesias. Symptoms are stable. Diabetic complications include peripheral neuropathy. Pertinent negatives for diabetic complications include no heart disease or nephropathy. Risk factors for coronary artery disease include dyslipidemia, diabetes mellitus, hypertension, sedentary lifestyle and post-menopausal. She is following a generally unhealthy diet. Her overall blood glucose range is 180-200 mg/dl. An ACE inhibitor/angiotensin II receptor blocker is being taken.  Hyperlipidemia This is a chronic problem. The current episode started more than 1 year ago. The problem is controlled. Exacerbating diseases include obesity. Pertinent negatives include no shortness of breath. Current antihyperlipidemic treatment includes statins. The current treatment provides moderate improvement of lipids. Risk factors for coronary artery disease include diabetes mellitus, dyslipidemia, hypertension and a sedentary lifestyle.  Hypertension This is a chronic problem. The current episode started more than 1 year ago. The problem has been resolved since onset. The problem is controlled. Associated symptoms include malaise/fatigue. Pertinent negatives include no blurred vision,  peripheral edema or shortness of breath. Risk factors for coronary artery disease include dyslipidemia, obesity and sedentary lifestyle. The current treatment provides moderate improvement. Identifiable causes of hypertension include a thyroid problem.  Thyroid Problem Presents for follow-up visit. Symptoms include fatigue and visual change. Patient reports no anxiety. The symptoms have been stable. Her past medical history is significant for hyperlipidemia.      Review of Systems  Constitutional: Positive for fatigue and malaise/fatigue.  Eyes: Negative for blurred vision.  Respiratory: Negative for shortness of breath.   Psychiatric/Behavioral: The patient is not nervous/anxious.   All other systems reviewed and are negative.      Objective:   Physical Exam Vitals reviewed.  Constitutional:      General: She is not in acute distress.    Appearance: She is well-developed. She is obese.  HENT:     Head: Normocephalic and atraumatic.     Right Ear: Tympanic membrane normal.     Left Ear: Tympanic membrane normal.  Eyes:     Pupils: Pupils are equal, round, and reactive to light.  Neck:     Thyroid: No thyromegaly.  Cardiovascular:     Rate and Rhythm: Normal rate and regular rhythm.     Heart sounds: Normal heart sounds. No murmur heard.   Pulmonary:     Effort: Pulmonary effort is normal. No respiratory distress.     Breath sounds: Normal breath sounds. No wheezing.  Abdominal:     General: Bowel sounds are normal. There is no distension.     Palpations: Abdomen is soft.     Tenderness: There is no abdominal tenderness.  Musculoskeletal:        General: No tenderness. Normal range of motion.     Cervical back: Normal range of motion and neck supple.  Skin:    General: Skin is warm and dry.  Neurological:  Mental Status: She is alert and oriented to person, place, and time.     Cranial Nerves: No cranial nerve deficit.     Deep Tendon Reflexes: Reflexes are normal  and symmetric.  Psychiatric:        Behavior: Behavior normal.        Thought Content: Thought content normal.        Judgment: Judgment normal.     Diabetic Foot Exam - Simple   Simple Foot Form Visual Inspection No deformities, no ulcerations, no other skin breakdown bilaterally: Yes Sensation Testing Intact to touch and monofilament testing bilaterally: Yes Pulse Check Posterior Tibialis and Dorsalis pulse intact bilaterally: Yes Comments      BP 120/67   Pulse (!) 54   Temp (!) 97.3 F (36.3 C) (Temporal)   Ht 5\' 1"  (1.549 m)   Wt 178 lb 6.4 oz (80.9 kg)   BMI 33.71 kg/m      Assessment & Plan:  DAVITA SUBLETT comes in today with chief complaint of Medical Management of Chronic Issues, Depression, and Diabetes   Diagnosis and orders addressed:  1. Dizziness - furosemide (LASIX) 20 MG tablet; Take 1 tablet (20 mg total) by mouth daily.  Dispense: 30 tablet; Refill: 1  2. Hypertension associated with diabetes ([place]) - metoprolol succinate (TOPROL-XL) 50 MG 24 hr tablet; Take 1 tablet (50 mg total) by mouth 2 (two) times daily. Take with or immediately following a meal.  Dispense: 180 tablet; Refill: 2 - losartan (COZAAR) 100 MG tablet; Take 1 tablet (100 mg total) by mouth daily.  Dispense: 90 tablet; Refill: 2  3. Type 2 diabetes mellitus with other specified complication, without long-term current use of insulin ([facility]) Pt will make an appointment with Clinical Pharm to help with medication assistance. She reports she has taken Januvia in the past, but it always made her into the "donut hole" and she would have to pay $400.  Will increase glimepiride to 4 mg from 2 mg, if we can get a new agent at a good cost we could stop this medication.  Strict low carb diet - metFORMIN (GLUCOPHAGE XR) 500 MG 24 hr tablet; Take 2 tablets (1,000 mg total) by mouth daily with breakfast.  Dispense: 180 tablet; Refill: 1 - Bayer DCA Hb A1c Waived - glimepiride (AMARYL) 4 MG tablet;  Take 1 tablet (4 mg total) by mouth daily with breakfast.  Dispense: 30 tablet; Refill: 5  4. Hyperlipidemia associated with type 2 diabetes mellitus ([place])  5. Hypothyroidism, unspecified type  6. Endometrial adenocarcinoma ([place])  7. B12 deficiency  8. Anemia due to antineoplastic chemotherapy  9. Obesity (BMI 30-39.9)  10. Vitamin D deficiency - Vitamin D, Ergocalciferol, (DRISDOL) 1.25 MG (50000 UNIT) CAPS capsule; Take 1 capsule (50,000 Units total) by mouth every 7 (seven) days.  Dispense: 12 capsule; Refill: 3   Labs reviewed Health Maintenance reviewed Diet and exercise encouraged  Follow up plan: 3 months    Evelina Dun, FNP

## 2020-01-10 NOTE — Patient Instructions (Signed)

## 2020-01-11 ENCOUNTER — Other Ambulatory Visit: Payer: Self-pay | Admitting: Family

## 2020-01-11 MED ORDER — INSULIN DEGLUDEC 100 UNIT/ML ~~LOC~~ SOPN
8.0000 [IU] | PEN_INJECTOR | Freq: Every day | SUBCUTANEOUS | 3 refills | Status: DC
Start: 2020-01-11 — End: 2020-04-11

## 2020-01-13 ENCOUNTER — Telehealth: Payer: Self-pay | Admitting: *Deleted

## 2020-01-13 NOTE — Telephone Encounter (Signed)
CALLED PATIENT TO INFORM OF APPT. WITH DR. Denman [place] ON 04-14-20 - ARRIVAL TIME- 1:30 PM, SPOKE WITH PATIENT AND SHE IS AWARE OF THIS APPT.

## 2020-01-15 ENCOUNTER — Other Ambulatory Visit: Payer: Self-pay | Admitting: Family

## 2020-01-15 DIAGNOSIS — R42 Dizziness and giddiness: Secondary | ICD-10-CM

## 2020-01-24 ENCOUNTER — Other Ambulatory Visit: Payer: Self-pay

## 2020-01-24 ENCOUNTER — Ambulatory Visit (INDEPENDENT_AMBULATORY_CARE_PROVIDER_SITE_OTHER): Payer: Medicare Other | Admitting: Pharmacist

## 2020-01-24 DIAGNOSIS — E1122 Type 2 diabetes mellitus with diabetic chronic kidney disease: Secondary | ICD-10-CM | POA: Diagnosis not present

## 2020-01-24 DIAGNOSIS — N183 Chronic kidney disease, stage 3 unspecified: Secondary | ICD-10-CM | POA: Diagnosis not present

## 2020-01-24 MED ORDER — DAPAGLIFLOZIN PROPANEDIOL 10 MG PO TABS
10.0000 mg | ORAL_TABLET | Freq: Every day | ORAL | 0 refills | Status: DC
Start: 2020-01-24 — End: 2020-10-27

## 2020-01-24 NOTE — Progress Notes (Signed)
    01/24/2020 Name: Alexandria Irwin MRN: [id number] DOB: 12/22/1945   S:  72 yoF presents for diabetes evaluation, education, and management Patient was referred and last seen by Primary Care Provider on 01/10/20.  Insurance coverage/medication affordability: medicare [employer]  Patient reports adherence with medications. . Current diabetes medications include: METFORMIN (GFR 54), HASN'T STARTED TRESIBA, GLIMEPIRIDE . Current hypertension medications include: METOPROLOL Goal 130/80 . Current hyperlipidemia medications include: SIMVASTATIN STATIN ALLERGY/INTOLERANCE DOCUMENTED IN EMR N/A   Patient denies hypoglycemic events.   Patient reported dietary habits: Eats 2-3 meals/day Discussed meal planning options and Plate method for healthy eating . Avoid sugary drinks and desserts . Incorporate balanced protein, non starchy veggies, 1 serving of carbohydrate with each meal . Increase water intake . Increase physical activity as able  Patient-reported exercise habits: n/a  O:  Lab Results  Component Value Date   HGBA1C 10.5 (H) 01/10/2020    There were no vitals filed for this visit.     Lipid Panel     Component Value Date/Time   CHOL 118 02/23/2019 0848   TRIG 161 (H) 02/23/2019 0848   TRIG 182 (H) 02/18/2013 0947   HDL 42 02/23/2019 0848   HDL 41 02/18/2013 0947   CHOLHDL 2.8 02/23/2019 0848   LDLCALC 44 02/23/2019 0848   LDLCALC 38 02/18/2013 0947     Home fasting blood sugars: 198, 175, 177  2 hour post-meal/random blood sugars: 220-250    A/P:  Diabetes T2DM currently uncontrolled, A1c 10.5%. Patient is able to verbalize appropriate hypoglycemia management plan. Patient is adherent with medication.   Started Farxiga 5mg  daily  Applied for patient assistance via [employer]  Samples -->farxiga 10mg  tablets given, cut in half  Stay hydrated  Counseled Sick day rules: if sick, vomiting, have diarrhea, or cannot drink enough fluids, you should stop taking  SGLT-2 inhibitors until your symptoms go away. In rare cases, these medicines can  cause diabetic ketoacidosis (DKA). DKA is acid buildup in the blood.  -Hold on insulin for now-patient weary of injection  -Continue glimepiride for now, f/u to d/c as able  -Continue metformin  -Extensively discussed pathophysiology of diabetes, recommended lifestyle interventions, dietary effects on blood sugar control  -Counseled on s/sx of and management of hypoglycemia  -Next A1C anticipated 04/11/2020.    Written patient instructions provided.  Total time in face to face counseling 30 minutes.   Follow up PCP Clinic Visit ON 04/11/2020.     Alexandria Irwin, PharmD, BCPS Clinical Pharmacist, [place]  [facility] Phone [phone]

## 2020-01-26 ENCOUNTER — Telehealth: Payer: Self-pay | Admitting: Pharmacist

## 2020-01-26 NOTE — Telephone Encounter (Signed)
[employer] application faxed for [place]

## 2020-01-28 ENCOUNTER — Ambulatory Visit (INDEPENDENT_AMBULATORY_CARE_PROVIDER_SITE_OTHER): Payer: Medicare Other

## 2020-01-28 DIAGNOSIS — Z0000 Encounter for general adult medical examination without abnormal findings: Secondary | ICD-10-CM | POA: Diagnosis not present

## 2020-01-28 NOTE — Progress Notes (Addendum)
MEDICARE ANNUAL WELLNESS VISIT  01/28/2020  Telephone Visit Disclaimer This Medicare AWV was conducted by telephone due to national recommendations for restrictions regarding the COVID-19 Pandemic (e.g. social distancing).  I verified, using two identifiers, that I am speaking with Alexandria Irwin or their authorized healthcare agent. I discussed the limitations, risks, security, and privacy concerns of performing an evaluation and management service by telephone and the potential availability of an in-person appointment in the future. The patient expressed understanding and agreed to proceed.   Subjective:  Alexandria Irwin is a 74 y.o. female patient of Hawks, [name], FNP who had a Medicare Annual Wellness Visit today via telephone. Alexandria Irwin is Retired and lives with her spouse. She has four children. She reports that she is socially active and does interact with friends/family regularly. She is minimally physically active and enjoys playing games on the computer.  Patient Care Team: Sharion Balloon, FNP as PCP - General (Family Medicine)  Advanced Directives 01/28/2020 11/04/2019 08/03/2019 05/06/2019 12/18/2018 10/26/2018 07/30/2018  Does Patient Have a Medical Advance Directive? No No No No No No No  Would patient like information on creating a medical advance directive? No - Patient declined No - Patient declined - - Yes (MAU/Ambulatory/Procedural Areas - Information given) - No - Patient declined    Hospital Utilization Over the Past 12 Months: # of hospitalizations or ER visits: 1 # of surgeries: 0  Review of Systems    Patient reports that her overall health is better compared to last year.  History obtained from chart review and the patient  Patient Reported Readings (BP, Pulse, CBG, Weight, etc) none  Pain Assessment Pain : No/denies pain     Current Medications & Allergies (verified) Allergies as of 01/28/2020       Reactions   Ace Inhibitors Cough    Ciprocin-fluocin-procin [fluocinolone] Hives   Norvasc [amlodipine] Cough   Latex Rash   Neosporin [neomycin-bacitracin Zn-polymyx] Rash   Sulfa Antibiotics Rash        Medication List        Accurate as of January 28, 2020  8:41 AM. If you have any questions, ask your nurse or doctor.          STOP taking these medications    D 2000 50 MCG (2000 UT) Tabs Generic drug: Cholecalciferol       TAKE these medications    acetaminophen 500 MG tablet Commonly known as: TYLENOL Take 1,000 mg by mouth every 8 (eight) hours as needed for mild pain or headache.   aspirin EC 81 MG tablet Take 81 mg by mouth daily.   CVS CALCIUM CITRATE +D PO Take 2 tablets by mouth every evening.   cycloSPORINE 0.05 % ophthalmic emulsion Commonly known as: RESTASIS Place 1 drop into both eyes 2 (two) times daily.   dapagliflozin propanediol 10 MG Tabs tablet Commonly known as: Farxiga Take 1 tablet (10 mg total) by mouth daily before breakfast.   Fish Oil 1000 MG Caps Take 1,000 mg by mouth every evening.   furosemide 20 MG tablet Commonly known as: LASIX Take 1 tablet (20 mg total) by mouth daily.   glimepiride 4 MG tablet Commonly known as: AMARYL Take 1 tablet (4 mg total) by mouth daily with breakfast.   insulin degludec 100 UNIT/ML FlexTouch Pen Commonly known as: TRESIBA Inject 0.08 mLs (8 Units total) into the skin daily.   levothyroxine 88 MCG tablet Commonly known as: Synthroid Take 1 tablet (88  mcg total) by mouth daily.   losartan 100 MG tablet Commonly known as: COZAAR Take 1 tablet (100 mg total) by mouth daily.   Magnesium 250 MG Tabs Take 250 mg by mouth every evening.   meclizine 25 MG tablet Commonly known as: ANTIVERT Take 1 tablet (25 mg total) by mouth 2 (two) times daily as needed for dizziness.   metFORMIN 500 MG 24 hr tablet Commonly known as: Glucophage XR Take 2 tablets (1,000 mg total) by mouth daily with breakfast.   metoprolol succinate 50  MG 24 hr tablet Commonly known as: TOPROL-XL Take 1 tablet (50 mg total) by mouth 2 (two) times daily. Take with or immediately following a meal.   OneTouch Delica Plus [id number] Misc 1 each by Other route daily. Dx E11.9   OneTouch Verio test strip Generic drug: glucose blood Test BS daily Dx E11.9   simvastatin 40 MG tablet Commonly known as: ZOCOR TAKE 1 TABLET BY MOUTH ONCE DAILY AT 6PM   vitamin C 500 MG tablet Commonly known as: ASCORBIC ACID Take 500 mg by mouth daily.   Vitamin D (Ergocalciferol) 1.25 MG (50000 UNIT) Caps capsule Commonly known as: DRISDOL Take 1 capsule (50,000 Units total) by mouth every 7 (seven) days.        History (reviewed): Past Medical History:  Diagnosis Date   Cataract    Endometrial adenocarcinoma ([place])    Environmental and seasonal allergies    Fall 07/2019   Family history of ovarian cancer    Family history of pancreatic cancer    Fx anat neck humerus-open 07/2019   Genetic testing 09/19/2017   Multi-Cancer panel (83 genes) @ [facility] - No pathogenic mutations detected   GERD (gastroesophageal reflux disease)    History of DVT of lower extremity    early 2000s--- lower right leg   Hyperlipidemia    Hypertension    Hypothyroidism    Type 2 diabetes mellitus ([place])    followed by pcp   Umbilical hernia    Vitamin B 12 deficiency    Vitamin D deficiency    Wears glasses    Past Surgical History:  Procedure Laterality Date   BREAST BIOPSY Left 2009   benign   CATARACT EXTRACTION W/ INTRAOCULAR LENS IMPLANT Left 2018   CATARACT EXTRACTION W/PHACO Right 03/14/2014   Procedure: CATARACT EXTRACTION PHACO AND INTRAOCULAR LENS PLACEMENT ([place]);  Surgeon: Tonny Branch, MD;  Location: [facility];  Service: Ophthalmology;  Laterality: Right;  CDE: 8.34   FOOT GANGLION EXCISION Right 2013   HYSTEROSCOPY WITH D & C N/A 06/13/2017   Procedure: DILATATION AND CURETTAGE /HYSTEROSCOPY;  Surgeon: Alden Hipp, MD;  Location: [facility];  Service:  Gynecology;  Laterality: N/A;   IR FLUORO GUIDE PORT INSERTION RIGHT  08/27/2017   IR REMOVAL TUN ACCESS W/ PORT W/O FL MOD SED  04/27/2018   IR US GUIDE VASC ACCESS RIGHT  08/27/2017   LAPAROSCOPIC CHOLECYSTECTOMY  2005   ROBOTIC ASSISTED TOTAL HYSTERECTOMY WITH BILATERAL SALPINGO OOPHERECTOMY Bilateral 08/05/2017   Procedure: ROBOTIC ASSISTED TOTAL HYSTERECTOMY WITH BILATERAL SALPINGO OOPHORECTOMY;  Surgeon: Everitt Amber, MD;  Location: [facility];  Service: Gynecology;  Laterality: Bilateral;   SENTINEL NODE BIOPSY Bilateral 08/05/2017   Procedure: SENTINEL NODE BIOPSY;  Surgeon: Everitt Amber, MD;  Location: [facility];  Service: Gynecology;  Laterality: Bilateral;   UMBILICAL HERNIA REPAIR N/A 08/05/2017   Procedure: HERNIA REPAIR UMBILICAL ADULT;  Surgeon: Everitt Amber, MD;  Location: [facility];  Service: Gynecology;  Laterality: N/A;  VEIN SURGERY Right 2011   vein stripping-leg  and laser treatment   Family History  Problem Relation Age of Onset   Pancreatic cancer Mother 24       deceased 32   Ovarian cancer Mother        dx 61s   Cancer Other 59       maternal half-sister; unk. type   Social History   Socioeconomic History   Marital status: Married    Spouse name: Johnny   Number of children: 4   Years of education: Not on file   Highest education level: Some college, no degree  Occupational History   Occupation: retired  Tobacco Use   Smoking status: Never Smoker   Smokeless tobacco: Never Used  Scientific laboratory technician Use: Never used  Substance and Sexual Activity   Alcohol use: No   Drug use: No   Sexual activity: Yes    Birth control/protection: Post-menopausal  Other Topics Concern   Not on file  Social History Narrative   Not on file   Social Determinants of Health   Financial Resource Strain:    Difficulty of Paying Living Expenses:   Food Insecurity:    Worried About Charity fundraiser in the Last Year:    Arboriculturist in the Last Year:   Transportation Needs:     Film/video editor (Medical):    Lack of Transportation (Non-Medical):   Physical Activity:    Days of Exercise per Week:    Minutes of Exercise per Session:   Stress:    Feeling of Stress :   Social Connections:    Frequency of Communication with Friends and Family:    Frequency of Social Gatherings with Friends and Family:    Attends Religious Services:    Active Member of Clubs or Organizations:    Attends Archivist Meetings:    Marital Status:     Activities of Daily Living In your present state of health, do you have any difficulty performing the following activities: 01/28/2020  Hearing? Y  Comment patient has noticed some hearing loss  Vision? N  Difficulty concentrating or making decisions? N  Walking or climbing stairs? N  Dressing or bathing? N  Doing errands, shopping? N  Preparing Food and eating ? N  Using the Toilet? N  In the past six months, have you accidently leaked urine? N  Do you have problems with loss of bowel control? N  Managing your Medications? N  Managing your Finances? N  Housekeeping or managing your Housekeeping? N  Some recent data might be hidden   Patient has noticed some decreasing in hearing, plans to schedule a hearing exam.  Patient Education/ Literacy How often do you need to have someone help you when you read instructions, pamphlets, or other written materials from your doctor or pharmacy?: 1 - Never What is the last grade level you completed in school?: Some college  Exercise Current Exercise Habits: The patient does not participate in regular exercise at present, Exercise limited by: None identified  Diet Patient reports consuming 3 meals a day and 2 snack(s) a day Patient reports that her primary diet is: Diabetic Patient reports that she does have regular access to food.   Depression Screen PHQ 2/9 Scores 01/28/2020 01/10/2020 09/14/2019 02/23/2019 12/18/2018 11/20/2018 07/02/2018  PHQ - 2 Score 0 0 0 0 0 0 0      Fall Risk Fall Risk  01/28/2020 01/10/2020 09/14/2019  02/23/2019 12/18/2018  Falls in the past year? 1 1 1  0 0  Number falls in past yr: 0 0 0 - 0  Injury with Fall? 1 1 1  - 0  Comment shoulder injury - - - -  Risk for fall due to : History of fall(s) Impaired balance/gait - - -  Follow up Falls evaluation completed - - - -     Objective:  Alexandria Irwin seemed alert and oriented and she participated appropriately during our telephone visit.  Blood Pressure Weight BMI  BP Readings from Last 3 Encounters:  01/10/20 120/67  12/29/19 117/77  11/04/19 130/68   Wt Readings from Last 3 Encounters:  01/10/20 178 lb 6.4 oz (80.9 kg)  12/29/19 180 lb 9.6 oz (81.9 kg)  11/04/19 180 lb 12.8 oz (82 kg)   BMI Readings from Last 1 Encounters:  01/10/20 33.71 kg/m    *Unable to obtain current vital signs, weight, and BMI due to telephone visit type  Hearing/Vision  Jaria did not seem to have difficulty with hearing/understanding during the telephone conversation Reports that she has had a formal eye exam by an eye care professional within the past year Reports that she has not had a formal hearing evaluation within the past year *Unable to fully assess hearing and vision during telephone visit type  Cognitive Function: 6CIT Screen 01/28/2020 12/18/2018  What Year? 0 points 0 points  What month? 0 points 0 points  What time? 0 points 0 points  Count back from 20 0 points 0 points  Months in reverse 0 points 0 points  Repeat phrase 0 points 0 points  Total Score 0 0   (Normal:0-7, Significant for Dysfunction: >8)  Normal Cognitive Function Screening: Yes   Immunization & Health Maintenance Record Immunization History  Administered Date(s) Administered   Influenza Whole 08/02/2011   Influenza,inj,Quad PF,6+ Mos 05/04/2013, 04/23/2014, 04/17/2015   Influenza-Unspecified 01/30/2016, 04/20/2019   PFIZER SARS-COV-2 Vaccination 08/07/2019, 09/02/2019   Pneumococcal Conjugate-13  04/22/2017   Pneumococcal Polysaccharide-23 06/01/2018   Tdap 12/12/2018    Health Maintenance  Topic Date Due   OPHTHALMOLOGY EXAM  05/20/2019   FOOT EXAM  06/02/2019   INFLUENZA VACCINE  01/30/2020   HEMOGLOBIN A1C  07/12/2020   TETANUS/TDAP  12/11/2028   DEXA SCAN  Completed   COVID-19 Vaccine  Completed   Hepatitis C Screening  Completed   PNA vac Low Risk Adult  Completed       Assessment  This is a routine wellness examination for Alexandria Irwin.  Health Maintenance: Due or Overdue Health Maintenance Due  Topic Date Due   OPHTHALMOLOGY EXAM  05/20/2019   FOOT EXAM  06/02/2019    Alexandria Irwin does not need a referral for Community Assistance: Care Management:   no Social Work:    no Prescription Assistance:  no Nutrition/Diabetes Education:  no   Plan:  Personalized Goals Goals Addressed             This Visit's Progress    Patient Stated       01/28/2020 AWV Goal: Exercise for General Health  Patient will verbalize understanding of the benefits of increased physical activity: Exercising regularly is important. It will improve your overall fitness, flexibility, and endurance. Regular exercise also will improve your overall health. It can help you control your weight, reduce stress, and improve your bone density. Over the next year, patient will increase physical activity as tolerated with a goal of at least 150 minutes  of moderate physical activity per week.  You can tell that you are exercising at a moderate intensity if your heart starts beating faster and you start breathing faster but can still hold a conversation. Moderate-intensity exercise ideas include: Walking 1 mile (1.6 km) in about 15 minutes Biking Hiking Golfing Dancing Water aerobics Patient will verbalize understanding of everyday activities that increase physical activity by providing examples like the following: Yard work, such as: Geophysical data processor Gardening Washing windows or floors Patient will be able to explain general safety guidelines for exercising:  Before you start a new exercise program, talk with your health care provider. Do not exercise so much that you hurt yourself, feel dizzy, or get very short of breath. Wear comfortable clothes and wear shoes with good support. Drink plenty of water while you exercise to prevent dehydration or heat stroke. Work out until your breathing and your heartbeat get faster.        Personalized Health Maintenance & Screening Recommendations  Bone densitometry screening  Lung Cancer Screening Recommended: no (Low Dose CT Chest recommended if Age 16-80 years, 30 pack-year currently smoking OR have quit w/in past 15 years) Hepatitis C Screening recommended: no HIV Screening recommended: no  Advanced Directives: Written information was not prepared per patient's request.  Referrals & Orders No orders of the defined types were placed in this encounter.   Follow-up Plan Follow-up with Sharion Balloon, FNP as planned Schedule Dexa Scan    I have personally reviewed and noted the following in the patient's chart:   Medical and social history Use of alcohol, tobacco or illicit drugs  Current medications and supplements Functional ability and status Nutritional status Physical activity Advanced directives List of other physicians Hospitalizations, surgeries, and ER visits in previous 12 months Vitals Screenings to include cognitive, depression, and falls Referrals and appointments  In addition, I have reviewed and discussed with Alexandria Irwin certain preventive protocols, quality metrics, and best practice recommendations. A written personalized care plan for preventive services as well as general preventive health recommendations is available and can be mailed to the patient at her request.      Felicity Coyer,  LPN    [id number]    Per patient, after visit summary was not mailed to patient  I have reviewed and agree with the above AWV documentation.   Evelina Dun, FNP

## 2020-01-31 ENCOUNTER — Other Ambulatory Visit: Payer: Self-pay | Admitting: Family

## 2020-02-01 ENCOUNTER — Encounter: Payer: Self-pay | Admitting: Pharmacist

## 2020-02-14 ENCOUNTER — Other Ambulatory Visit: Payer: Self-pay | Admitting: Family

## 2020-02-14 DIAGNOSIS — R42 Dizziness and giddiness: Secondary | ICD-10-CM

## 2020-02-15 DIAGNOSIS — M25531 Pain in right wrist: Secondary | ICD-10-CM | POA: Diagnosis not present

## 2020-02-15 DIAGNOSIS — S52134A Nondisplaced fracture of neck of right radius, initial encounter for closed fracture: Secondary | ICD-10-CM | POA: Diagnosis not present

## 2020-02-15 DIAGNOSIS — M25521 Pain in right elbow: Secondary | ICD-10-CM | POA: Diagnosis not present

## 2020-02-22 ENCOUNTER — Other Ambulatory Visit: Payer: Self-pay | Admitting: Family

## 2020-02-22 DIAGNOSIS — R42 Dizziness and giddiness: Secondary | ICD-10-CM

## 2020-03-13 ENCOUNTER — Other Ambulatory Visit: Payer: Self-pay

## 2020-03-13 ENCOUNTER — Encounter: Payer: Self-pay | Admitting: Family Medicine

## 2020-03-13 ENCOUNTER — Ambulatory Visit (INDEPENDENT_AMBULATORY_CARE_PROVIDER_SITE_OTHER): Payer: Medicare Other | Admitting: Family Medicine

## 2020-03-13 VITALS — BP 115/76 | HR 58 | Temp 97.0°F | Ht 61.0 in | Wt 169.0 lb

## 2020-03-13 DIAGNOSIS — R358 Other polyuria: Secondary | ICD-10-CM | POA: Diagnosis not present

## 2020-03-13 DIAGNOSIS — R300 Dysuria: Secondary | ICD-10-CM | POA: Diagnosis not present

## 2020-03-13 DIAGNOSIS — N3000 Acute cystitis without hematuria: Secondary | ICD-10-CM

## 2020-03-13 DIAGNOSIS — R3589 Other polyuria: Secondary | ICD-10-CM

## 2020-03-13 LAB — URINALYSIS, COMPLETE
Bilirubin, UA: NEGATIVE
Ketones, UA: NEGATIVE
Nitrite, UA: POSITIVE — AB
Protein,UA: NEGATIVE
Specific Gravity, UA: 1.015 (ref 1.005–1.030)
Urobilinogen, Ur: 0.2 mg/dL (ref 0.2–1.0)
pH, UA: 5 (ref 5.0–7.5)

## 2020-03-13 LAB — MICROSCOPIC EXAMINATION: WBC, UA: >30 /hpf — AB (ref 0–5)

## 2020-03-13 MED ORDER — FLUCONAZOLE 150 MG PO TABS
150.0000 mg | ORAL_TABLET | Freq: Once | ORAL | 0 refills | Status: AC
Start: 2020-03-13 — End: 2020-03-13

## 2020-03-13 MED ORDER — DOXYCYCLINE HYCLATE 100 MG PO TABS: 100.0000 mg | ORAL_TABLET | Freq: Two times a day (BID) | ORAL | 0 refills | Status: DC

## 2020-03-13 NOTE — Progress Notes (Signed)
BP 115/76   Pulse (!) 58   Temp (!) 97 F (36.1 C)   Ht 5\' 1"  (1.549 m)   Wt 169 lb (76.7 kg)   SpO2 97%   BMI 31.93 kg/m    Subjective:   Patient ID: Alexandria Irwin, female    DOB: 05/22/1946, 74 y.o.   MRN: [id number]  HPI: Alexandria Irwin is a 74 y.o. female presenting on 03/13/2020 for Urinary Tract Infection (burning and polyuria)   HPI Patient is coming in complaining of dysuria and frequency and burning that is been going on over the past 3 days.  She says she has been taking Azo and it has helped with the symptoms but is not fixing it.  She denies any abdominal pain or fevers or chills or flank pain.  She says is not worsening but is definitely not getting better.  She is on [place] recently since July and this is the first time she had any issues with urinary infections.  Patient denies any fevers or chills.  Relevant past medical, surgical, family and social history reviewed and updated as indicated. Interim medical history since our last visit reviewed. Allergies and medications reviewed and updated.  Review of Systems  Constitutional: Negative for chills and fever.  Eyes: Negative for visual disturbance.  Respiratory: Negative for chest tightness and shortness of breath.   Cardiovascular: Negative for chest pain and leg swelling.  Gastrointestinal: Negative for abdominal pain.  Genitourinary: Positive for dysuria, frequency and urgency. Negative for difficulty urinating, flank pain, hematuria, vaginal bleeding, vaginal discharge and vaginal pain.  Musculoskeletal: Negative for back pain and gait problem.  Skin: Negative for rash.  Neurological: Negative for light-headedness and headaches.  Psychiatric/Behavioral: Negative for agitation and behavioral problems.  All other systems reviewed and are negative.   Per HPI unless specifically indicated above   Allergies as of 03/13/2020      Reactions   Ace Inhibitors Cough   Ciprocin-fluocin-procin [fluocinolone] Hives    Norvasc [amlodipine] Cough   Latex Rash   Neosporin [neomycin-bacitracin Zn-polymyx] Rash   Sulfa Antibiotics Rash      Medication List       Accurate as of March 13, 2020 12:25 PM. If you have any questions, ask your nurse or doctor.        acetaminophen 500 MG tablet Commonly known as: TYLENOL Take 1,000 mg by mouth every 8 (eight) hours as needed for mild pain or headache.   aspirin EC 81 MG tablet Take 81 mg by mouth daily.   CVS CALCIUM CITRATE +D PO Take 2 tablets by mouth every evening.   cycloSPORINE 0.05 % ophthalmic emulsion Commonly known as: RESTASIS Place 1 drop into both eyes 2 (two) times daily.   dapagliflozin propanediol 10 MG Tabs tablet Commonly known as: Farxiga Take 1 tablet (10 mg total) by mouth daily before breakfast. What changed: how much to take   doxycycline 100 MG tablet Commonly known as: VIBRA-TABS Take 1 tablet (100 mg total) by mouth 2 (two) times daily. 1 po bid Started by: Fransisca Kaufmann Loyed Wilmes, MD   Fish Oil 1000 MG Caps Take 1,000 mg by mouth every evening.   fluconazole 150 MG tablet Commonly known as: Diflucan Take 1 tablet (150 mg total) by mouth once for 1 dose. Started by: Fransisca Kaufmann Vamsi Apfel, MD   furosemide 20 MG tablet Commonly known as: LASIX TAKE 1 TABLET BY MOUTH EVERY DAY   glimepiride 4 MG tablet Commonly known as: AMARYL Take  1 tablet (4 mg total) by mouth daily with breakfast.   insulin degludec 100 UNIT/ML FlexTouch Pen Commonly known as: TRESIBA Inject 0.08 mLs (8 Units total) into the skin daily.   levothyroxine 88 MCG tablet Commonly known as: Synthroid Take 1 tablet (88 mcg total) by mouth daily.   losartan 100 MG tablet Commonly known as: COZAAR Take 1 tablet (100 mg total) by mouth daily.   Magnesium 250 MG Tabs Take 250 mg by mouth every evening.   meclizine 25 MG tablet Commonly known as: ANTIVERT TAKE 1 TABLET (25 MG TOTAL) BY MOUTH 2 (TWO) TIMES DAILY AS NEEDED FOR DIZZINESS.     metFORMIN 500 MG 24 hr tablet Commonly known as: Glucophage XR Take 2 tablets (1,000 mg total) by mouth daily with breakfast.   metoprolol succinate 50 MG 24 hr tablet Commonly known as: TOPROL-XL Take 1 tablet (50 mg total) by mouth 2 (two) times daily. Take with or immediately following a meal.   OneTouch Delica Plus [id number] Misc 1 each by Other route daily. Dx E11.9   OneTouch Verio test strip Generic drug: glucose blood Test BS daily Dx E11.9   simvastatin 40 MG tablet Commonly known as: ZOCOR TAKE 1 TABLET BY MOUTH ONCE DAILY AT 6PM   vitamin C 500 MG tablet Commonly known as: ASCORBIC ACID Take 500 mg by mouth daily.   Vitamin D (Ergocalciferol) 1.25 MG (50000 UNIT) Caps capsule Commonly known as: DRISDOL Take 1 capsule (50,000 Units total) by mouth every 7 (seven) days.        Objective:   BP 115/76   Pulse (!) 58   Temp (!) 97 F (36.1 C)   Ht 5\' 1"  (1.549 m)   Wt 169 lb (76.7 kg)   SpO2 97%   BMI 31.93 kg/m   Wt Readings from Last 3 Encounters:  03/13/20 169 lb (76.7 kg)  01/10/20 178 lb 6.4 oz (80.9 kg)  12/29/19 180 lb 9.6 oz (81.9 kg)    Physical Exam Vitals and nursing note reviewed.  Constitutional:      General: She is not in acute distress.    Appearance: She is well-developed. She is not diaphoretic.  Eyes:     Conjunctiva/sclera: Conjunctivae normal.  Cardiovascular:     Rate and Rhythm: Normal rate and regular rhythm.     Heart sounds: Normal heart sounds. No murmur heard.   Pulmonary:     Effort: Pulmonary effort is normal. No respiratory distress.     Breath sounds: Normal breath sounds. No wheezing.  Abdominal:     General: Bowel sounds are normal. There is no distension.     Palpations: Abdomen is soft. Abdomen is not rigid. There is no mass.     Tenderness: There is no abdominal tenderness. There is no guarding or rebound.  Skin:    General: Skin is warm and dry.     Findings: No rash.  Neurological:     Mental Status:  She is alert and oriented to person, place, and time.     Coordination: Coordination normal.  Psychiatric:        Behavior: Behavior normal.     Greater than 30 WBCs, 0-2 RBCs, 0-10 epithelial cells, few bacteria, nitrite positive, 1+ leukocyte, 1+ blood, 3+ glucose.  Assessment & Plan:   Problem List Items Addressed This Visit    None    Visit Diagnoses    Acute cystitis without hematuria    -  Primary   Relevant Medications  doxycycline (VIBRA-TABS) 100 MG tablet   Other Relevant Orders   Urinalysis, Complete   Urine Culture   Polyuria       Relevant Medications   doxycycline (VIBRA-TABS) 100 MG tablet   Other Relevant Orders   Urinalysis, Complete   Urine Culture      Patient is on [place] recently and may have been something that attributed to this UTI, if she gets these recurrently in the future that may be something she has to reassess but since this is only the first 1 I would not be alarmed at this point yet.  We will treat with doxycycline and follow-up with her PCP Follow up plan: Return if symptoms worsen or fail to improve.  Counseling provided for all of the vaccine components Orders Placed This Encounter  Procedures  . Urine Culture  . Urinalysis, Complete    Caryl Pina, MD [facility] 03/13/2020, 12:25 PM

## 2020-03-13 NOTE — Progress Notes (Signed)
Medicare Annual Wellness Visit was conducted with:  Patient location - home Provider location - [place]

## 2020-03-15 ENCOUNTER — Other Ambulatory Visit: Payer: Self-pay | Admitting: Family

## 2020-03-15 DIAGNOSIS — R42 Dizziness and giddiness: Secondary | ICD-10-CM

## 2020-03-16 DIAGNOSIS — L84 Corns and callosities: Secondary | ICD-10-CM | POA: Diagnosis not present

## 2020-03-16 DIAGNOSIS — B351 Tinea unguium: Secondary | ICD-10-CM | POA: Diagnosis not present

## 2020-03-16 DIAGNOSIS — M79676 Pain in unspecified toe(s): Secondary | ICD-10-CM | POA: Diagnosis not present

## 2020-03-16 DIAGNOSIS — E1142 Type 2 diabetes mellitus with diabetic polyneuropathy: Secondary | ICD-10-CM | POA: Diagnosis not present

## 2020-03-18 LAB — URINE CULTURE

## 2020-03-19 ENCOUNTER — Other Ambulatory Visit: Payer: Self-pay | Admitting: Family

## 2020-03-21 DIAGNOSIS — S52124D Nondisplaced fracture of head of right radius, subsequent encounter for closed fracture with routine healing: Secondary | ICD-10-CM | POA: Diagnosis not present

## 2020-03-21 DIAGNOSIS — M25521 Pain in right elbow: Secondary | ICD-10-CM | POA: Diagnosis not present

## 2020-03-21 DIAGNOSIS — S52121A Displaced fracture of head of right radius, initial encounter for closed fracture: Secondary | ICD-10-CM | POA: Insufficient documentation

## 2020-03-29 ENCOUNTER — Other Ambulatory Visit: Payer: Self-pay

## 2020-03-29 ENCOUNTER — Encounter: Payer: Self-pay | Admitting: Physical Therapy

## 2020-03-29 ENCOUNTER — Ambulatory Visit: Payer: Medicare Other | Attending: Orthopedic Surgery | Admitting: Physical Therapy

## 2020-03-29 DIAGNOSIS — M25621 Stiffness of right elbow, not elsewhere classified: Secondary | ICD-10-CM | POA: Diagnosis not present

## 2020-03-29 DIAGNOSIS — M6281 Muscle weakness (generalized): Secondary | ICD-10-CM | POA: Insufficient documentation

## 2020-03-29 DIAGNOSIS — M25521 Pain in right elbow: Secondary | ICD-10-CM | POA: Diagnosis not present

## 2020-03-29 NOTE — Therapy (Addendum)
[facility] [place], [place], [address] Phone: [phone]   Fax:  [phone]  Physical Therapy Evaluation  Patient Details  Name: Alexandria Irwin MRN: [id number] Date of Birth: Nov 28, 1945 Referring Provider (PT): Esmond Plants, MD   Encounter Date: 03/29/2020   PT End of Session - 03/29/20 1000    Visit Number 1    Number of Visits 4    Date for PT Re-Evaluation 05/03/20    Authorization Type Progress note every 10th visit    PT Start Time 0900    PT Stop Time 0942    PT Time Calculation (min) 42 min    Activity Tolerance Patient tolerated treatment well    Behavior During Therapy [facility] for tasks assessed/performed           Past Medical History:  Diagnosis Date  . Cataract   . Endometrial adenocarcinoma ([place])   . Environmental and seasonal allergies   . Fall 07/2019  . Family history of ovarian cancer   . Family history of pancreatic cancer   . Fx anat neck humerus-open 07/2019  . Genetic testing 09/19/2017   Multi-Cancer panel (83 genes) @ [facility] - No pathogenic mutations detected  . GERD (gastroesophageal reflux disease)   . History of DVT of lower extremity    early 2000s--- lower right leg  . Hyperlipidemia   . Hypertension   . Hypothyroidism   . Type 2 diabetes mellitus ([place])    followed by pcp  . Umbilical hernia   . Vitamin B 12 deficiency   . Vitamin D deficiency   . Wears glasses     Past Surgical History:  Procedure Laterality Date  . BREAST BIOPSY Left 2009   benign  . CATARACT EXTRACTION W/ INTRAOCULAR LENS IMPLANT Left 2018  . CATARACT EXTRACTION W/PHACO Right 03/14/2014   Procedure: CATARACT EXTRACTION PHACO AND INTRAOCULAR LENS PLACEMENT (IOC);  Surgeon: Tonny Branch, MD;  Location: [facility];  Service: Ophthalmology;  Laterality: Right;  CDE: 8.34  . FOOT GANGLION EXCISION Right 2013  . HYSTEROSCOPY WITH D & C N/A 06/13/2017   Procedure: DILATATION AND CURETTAGE /HYSTEROSCOPY;  Surgeon:  Alden Hipp, MD;  Location: [facility];  Service: Gynecology;  Laterality: N/A;  . IR FLUORO GUIDE PORT INSERTION RIGHT  08/27/2017  . IR REMOVAL TUN ACCESS W/ PORT W/O FL MOD SED  04/27/2018  . IR US GUIDE VASC ACCESS RIGHT  08/27/2017  . LAPAROSCOPIC CHOLECYSTECTOMY  2005  . ROBOTIC ASSISTED TOTAL HYSTERECTOMY WITH BILATERAL SALPINGO OOPHERECTOMY Bilateral 08/05/2017   Procedure: ROBOTIC ASSISTED TOTAL HYSTERECTOMY WITH BILATERAL SALPINGO OOPHORECTOMY;  Surgeon: Everitt Amber, MD;  Location: [facility];  Service: Gynecology;  Laterality: Bilateral;  . SENTINEL NODE BIOPSY Bilateral 08/05/2017   Procedure: SENTINEL NODE BIOPSY;  Surgeon: Everitt Amber, MD;  Location: [facility];  Service: Gynecology;  Laterality: Bilateral;  . UMBILICAL HERNIA REPAIR N/A 08/05/2017   Procedure: HERNIA REPAIR UMBILICAL ADULT;  Surgeon: Everitt Amber, MD;  Location: [facility];  Service: Gynecology;  Laterality: N/A;  . VEIN SURGERY Right 2011   vein stripping-leg  and laser treatment    There were no vitals filed for this visit.    Subjective Assessment - 03/29/20 1056    Subjective COVID-19 screening performed upon arrival. Patient arrives to physical therapy with reports of right elbow pain, decreased ROM, and difficulty performing ADLs and home activities due to a right elbow fracture about 2 months ago. Patient reports ability to perform basic ADLs but requires assistance  from husband and daughter for home activities and lifting activities. Patient reports being compliant with precautions of no pushing up or lifting provided by doctor. Patient reports pain at worst as 6/10 and pain at best as 0/10. Patient's goals are to decrease pain, improve movement, and improve ability to perform ADLs and home activities.    Pertinent History right elbow fracture with routine healing; right humeral head fracture 07/02/2019, HTN, DM    Limitations Lifting;Writing;House hold activities    Diagnostic tests x-ray: right elbow fracture, routine healing     Patient Stated Goals "get back to normal routine"    Currently in Pain? No/denies              [facility] PT Assessment - 03/29/20 0001      Assessment   Medical Diagnosis Right elbow pain    Referring Provider (PT) Esmond Plants, MD    Onset Date/Surgical Date --   fall about 2 months ago   Hand Dominance Right    Next MD Visit 04/18/2020    Prior Therapy for shoulder      Precautions   Precautions Other (comment)    Precaution Comments slow progression, PROM then transition to HEP per referral      Balance Screen   Has the patient fallen in the past 6 months Yes    How many times? 1    Has the patient had a decrease in activity level because of a fear of falling?  Yes    Is the patient reluctant to leave their home because of a fear of falling?  No      Home Environment   Living Environment Private residence    Living Arrangements Spouse/significant other      Prior Function   Level of Independence Independent with basic ADLs;Needs assistance with homemaking      Observation/Other Assessments   Observations R arm held in guarded position      ROM / Strength   AROM / PROM / Strength AROM;PROM;Strength      AROM   Overall AROM  Deficits    AROM Assessment Site Elbow;Forearm    Right/Left Elbow Right    Right Elbow Flexion 128    Right Elbow Extension -52    Right/Left Forearm Right    Right Forearm Pronation 90 Degrees    Right Forearm Supination 60 Degrees      PROM   Overall PROM  Deficits    PROM Assessment Site Elbow;Forearm    Right/Left Shoulder Right    Right Shoulder Flexion 124 Degrees    Right Shoulder External Rotation 50 Degrees    Right/Left Elbow Right    Right Elbow Flexion 130    Right Elbow Extension -48    Right/Left Forearm Right    Right Forearm Pronation 90 Degrees    Right Forearm Supination 80 Degrees      Strength   Overall Strength Deficits    Strength Assessment Site Hand    Right/Left hand Right;Left    Right Hand Grip  (lbs) 20    Left Hand Grip (lbs) 35      Palpation   Palpation comment tenderness to R elbow region and forearm upon palpation. Increased R biceps tone                      Objective measurements completed on examination: See above findings.  PT Long Term Goals - 03/29/20 1109      PT LONG TERM GOAL #1   Title Patient will be independent HEP    Period Weeks    Status New      PT LONG TERM GOAL #2   Title Patient will demonstrate 0-155 degress of right elbow AROM to improve ability to perform functional tasks.    Baseline --    Time 4    Period Weeks    Status New      PT LONG TERM GOAL #3   Title Patient will demonstrate 30+ lbs of right grip strength to improve strength for functional tasks.    Baseline --    Time 4    Period Weeks    Status New      PT LONG TERM GOAL #4   Title Patient will report ability to perform ADLs and home activities with right elbow pain less than or equal to 3/10.    Time 4    Period Weeks    Status New                  Plan - 03/29/20 1104    Clinical Impression Statement Patient is a 74 year old old female who presents to physical therapy with right elbow pain, decreased right elbow and forearm ROM, and decreased R grip stength. Patient with notable R elbow edema and elbow flexed in guarded position upon observation. Patient and PT discussed POC and HEP to maximize PT benefit. Patient would benefit from skilled physical therapy to address deficits and goals.    Personal Factors and Comorbidities Age;Comorbidity 2    Comorbidities right humeral head fracture 07/02/2019, HTN, DM    Examination-Activity Limitations Bathing;Dressing;Hygiene/Grooming;Lift;Carry    Examination-Participation Restrictions Driving    Stability/Clinical Decision Making Stable/Uncomplicated    Clinical Decision Making Low    Rehab Potential Good    PT Frequency 3x / week    PT Duration 4 weeks    PT  Treatment/Interventions ADLs/Self Care Home Management;Iontophoresis 4mg /ml Dexamethasone;Moist Heat;Electrical Stimulation;Cryotherapy;Therapeutic activities;Therapeutic exercise;Neuromuscular re-education;Manual techniques;Passive range of motion;Patient/family education    PT Next Visit Plan Right elbow, shoulder and forearm PROM to improve range of motion per MD referral. Modalities PRN for pain relief.    PT Home Exercise Plan see patient education section    Consulted and Agree with Plan of Care Patient           Patient will benefit from skilled therapeutic intervention in order to improve the following deficits and impairments:  Decreased activity tolerance, Decreased strength, Decreased range of motion, Impaired UE functional use, Pain, Postural dysfunction  Visit Diagnosis: Pain in right elbow - Plan: PT plan of care cert/re-cert  Stiffness of right elbow, not elsewhere classified - Plan: PT plan of care cert/re-cert  Muscle weakness (generalized) - Plan: PT plan of care cert/re-cert     Problem List Patient Active Problem List   Diagnosis Date Noted  . Pancytopenia, acquired ([place]) 12/16/2017  . Anemia due to antineoplastic chemotherapy 11/26/2017  . Peripheral neuropathy due to chemotherapy ([place]) 10/14/2017  . Genetic testing 09/19/2017  . Family history of ovarian cancer   . Family history of pancreatic cancer   . Obesity (BMI 30-39.9) 08/26/2017  . Easy bruising 08/18/2017  . Endometrial cancer, FIGO stage IIIA ([place]) 08/12/2017  . Endometrial cancer ([place]) 08/05/2017  . B12 deficiency 06/26/2017  . Endometrial adenocarcinoma ([place]) 06/25/2017  . Umbilical hernia without obstruction and without gangrene 06/25/2017  .  Vitamin D deficiency   . Thrombophlebitis leg 09/18/2013  . Hypertension associated with diabetes ([place]) 12/03/2012  . Diabetes ([place]) 12/03/2012  . Hypothyroidism 12/03/2012  . Hyperlipidemia associated with type 2 diabetes mellitus ([place]) 12/03/2012     Gabriela Eves, PT, DPT 03/29/2020, [facility] [address] [place], [place], [address] Phone: [phone]   Fax:  [phone]  Name: Alexandria Irwin MRN: [id number] Date of Birth: 1945-12-02

## 2020-03-29 NOTE — Addendum Note (Signed)
Addended by: Gabriela Eves on: 03/29/2020 11:14 AM   Modules accepted: Orders

## 2020-04-05 ENCOUNTER — Other Ambulatory Visit: Payer: Self-pay

## 2020-04-05 ENCOUNTER — Encounter: Payer: Self-pay | Admitting: Physical Therapy

## 2020-04-05 ENCOUNTER — Ambulatory Visit: Payer: Medicare Other | Attending: Orthopedic Surgery | Admitting: Physical Therapy

## 2020-04-05 DIAGNOSIS — M25521 Pain in right elbow: Secondary | ICD-10-CM | POA: Diagnosis not present

## 2020-04-05 DIAGNOSIS — M6281 Muscle weakness (generalized): Secondary | ICD-10-CM | POA: Insufficient documentation

## 2020-04-05 DIAGNOSIS — M25621 Stiffness of right elbow, not elsewhere classified: Secondary | ICD-10-CM | POA: Diagnosis not present

## 2020-04-05 NOTE — Therapy (Signed)
[facility] [place], [place], [address] Phone: [phone]   Fax:  [phone]  Physical Therapy Treatment  Patient Details  Name: Alexandria Irwin MRN: [id number] Date of Birth: 09/25/1945 Referring Provider (PT): Esmond Plants, MD   Encounter Date: 04/05/2020   PT End of Session - 04/05/20 1000    Visit Number 2    Number of Visits 4    Date for PT Re-Evaluation 05/03/20    Authorization Type Progress note every 10th visit    PT Start Time 0900    PT Stop Time 0947    PT Time Calculation (min) 47 min    Activity Tolerance Patient tolerated treatment well    Behavior During Therapy [facility] for tasks assessed/performed           Past Medical History:  Diagnosis Date  . Cataract   . Endometrial adenocarcinoma ([place])   . Environmental and seasonal allergies   . Fall 07/2019  . Family history of ovarian cancer   . Family history of pancreatic cancer   . Fx anat neck humerus-open 07/2019  . Genetic testing 09/19/2017   Multi-Cancer panel (83 genes) @ [facility] - No pathogenic mutations detected  . GERD (gastroesophageal reflux disease)   . History of DVT of lower extremity    early 2000s--- lower right leg  . Hyperlipidemia   . Hypertension   . Hypothyroidism   . Type 2 diabetes mellitus ([place])    followed by pcp  . Umbilical hernia   . Vitamin B 12 deficiency   . Vitamin D deficiency   . Wears glasses     Past Surgical History:  Procedure Laterality Date  . BREAST BIOPSY Left 2009   benign  . CATARACT EXTRACTION W/ INTRAOCULAR LENS IMPLANT Left 2018  . CATARACT EXTRACTION W/PHACO Right 03/14/2014   Procedure: CATARACT EXTRACTION PHACO AND INTRAOCULAR LENS PLACEMENT (IOC);  Surgeon: Tonny Branch, MD;  Location: [facility];  Service: Ophthalmology;  Laterality: Right;  CDE: 8.34  . FOOT GANGLION EXCISION Right 2013  . HYSTEROSCOPY WITH D & C N/A 06/13/2017   Procedure: DILATATION AND CURETTAGE /HYSTEROSCOPY;  Surgeon:  Alden Hipp, MD;  Location: [facility];  Service: Gynecology;  Laterality: N/A;  . IR FLUORO GUIDE PORT INSERTION RIGHT  08/27/2017  . IR REMOVAL TUN ACCESS W/ PORT W/O FL MOD SED  04/27/2018  . IR US GUIDE VASC ACCESS RIGHT  08/27/2017  . LAPAROSCOPIC CHOLECYSTECTOMY  2005  . ROBOTIC ASSISTED TOTAL HYSTERECTOMY WITH BILATERAL SALPINGO OOPHERECTOMY Bilateral 08/05/2017   Procedure: ROBOTIC ASSISTED TOTAL HYSTERECTOMY WITH BILATERAL SALPINGO OOPHORECTOMY;  Surgeon: Everitt Amber, MD;  Location: [facility];  Service: Gynecology;  Laterality: Bilateral;  . SENTINEL NODE BIOPSY Bilateral 08/05/2017   Procedure: SENTINEL NODE BIOPSY;  Surgeon: Everitt Amber, MD;  Location: [facility];  Service: Gynecology;  Laterality: Bilateral;  . UMBILICAL HERNIA REPAIR N/A 08/05/2017   Procedure: HERNIA REPAIR UMBILICAL ADULT;  Surgeon: Everitt Amber, MD;  Location: [facility];  Service: Gynecology;  Laterality: N/A;  . VEIN SURGERY Right 2011   vein stripping-leg  and laser treatment    There were no vitals filed for this visit.   Subjective Assessment - 04/05/20 0959    Subjective COVID-19 screening performed upon arrival. Patient reports ability to move her elbow a little better. Biggest difficulty is with doing her hair.    Pertinent History right elbow fracture with routine healing; right humeral head fracture 07/02/2019, HTN, DM    Limitations Lifting;Writing;House hold  activities    Diagnostic tests x-ray: right elbow fracture, routine healing    Patient Stated Goals "get back to normal routine"    Currently in Pain? No/denies              [facility] PT Assessment - 04/05/20 0001      Assessment   Medical Diagnosis Right elbow pain    Referring Provider (PT) Esmond Plants, MD    Onset Date/Surgical Date 07/02/19    Hand Dominance Right    Next MD Visit 04/18/2020    Prior Therapy for shoulder      Precautions   Precautions Other (comment)    Precaution Comments slow progression, PROM then transition to HEP per  referral      AROM   Right Elbow Extension -24      PROM   Right Elbow Extension -20                         [facility] Adult PT Treatment/Exercise - 04/05/20 0001      Modalities   Modalities Electrical Stimulation;Moist Heat      Moist Heat Therapy   Number Minutes Moist Heat 10 Minutes    Moist Heat Location Elbow      Electrical Stimulation   Electrical Stimulation Location right distal bicep and proximal forearm    Electrical Stimulation Action pre-mod    Electrical Stimulation Parameters 80-150 hz x10 mins    Electrical Stimulation Goals Tone;Pain      Manual Therapy   Manual Therapy Passive ROM    Soft tissue mobilization STW/M to distal biceps to decrease tone    Passive ROM PROM to R elbow into extension with low loads, PROM into pronation and supination.                       PT Long Term Goals - 03/29/20 1109      PT LONG TERM GOAL #1   Title Patient will be independent HEP    Period Weeks    Status New      PT LONG TERM GOAL #2   Title Patient will demonstrate 0-155 degress of right elbow AROM to improve ability to perform functional tasks.    Baseline --    Time 4    Period Weeks    Status New      PT LONG TERM GOAL #3   Title Patient will demonstrate 30+ lbs of right grip strength to improve strength for functional tasks.    Baseline --    Time 4    Period Weeks    Status New      PT LONG TERM GOAL #4   Title Patient will report ability to perform ADLs and home activities with right elbow pain less than or equal to 3/10.    Time 4    Period Weeks    Status New                 Plan - 04/05/20 1000    Clinical Impression Statement Patient responded fairly well to therapy session but with reports of increased soreness and pain in distal biceps and proximal forearm region. Patient required intermittent oscillations to decrease muscle guarding and pain with PROM. STW/M to right distal biceps performed to decrease  tone. Notable improvements in PROM elbow extension, see measurements. No adverse affects noted upon removal of modalities. Patient educated to allow arm to extend while walking to improve  ROM but still with R arm flexed and in a guarded position.    Personal Factors and Comorbidities Age;Comorbidity 2    Comorbidities right humeral head fracture 07/02/2019, HTN, DM    Examination-Activity Limitations Bathing;Dressing;Hygiene/Grooming;Lift;Carry    Examination-Participation Restrictions Driving    Clinical Decision Making Low    Rehab Potential Good    PT Frequency 3x / week    PT Duration 4 weeks    PT Treatment/Interventions ADLs/Self Care Home Management;Iontophoresis 4mg /ml Dexamethasone;Moist Heat;Electrical Stimulation;Cryotherapy;Therapeutic activities;Therapeutic exercise;Neuromuscular re-education;Manual techniques;Passive range of motion;Patient/family education    PT Next Visit Plan Right elbow, shoulder and forearm PROM to improve range of motion per MD referral. Modalities PRN for pain relief.    PT Home Exercise Plan see patient education section    Consulted and Agree with Plan of Care Patient           Patient will benefit from skilled therapeutic intervention in order to improve the following deficits and impairments:  Decreased activity tolerance, Decreased strength, Decreased range of motion, Impaired UE functional use, Pain, Postural dysfunction  Visit Diagnosis: Pain in right elbow  Stiffness of right elbow, not elsewhere classified  Muscle weakness (generalized)     Problem List Patient Active Problem List   Diagnosis Date Noted  . Pancytopenia, acquired ([place]) 12/16/2017  . Anemia due to antineoplastic chemotherapy 11/26/2017  . Peripheral neuropathy due to chemotherapy ([place]) 10/14/2017  . Genetic testing 09/19/2017  . Family history of ovarian cancer   . Family history of pancreatic cancer   . Obesity (BMI 30-39.9) 08/26/2017  . Easy bruising 08/18/2017  .  Endometrial cancer, FIGO stage IIIA ([place]) 08/12/2017  . Endometrial cancer ([place]) 08/05/2017  . B12 deficiency 06/26/2017  . Endometrial adenocarcinoma ([place]) 06/25/2017  . Umbilical hernia without obstruction and without gangrene 06/25/2017  . Vitamin D deficiency   . Thrombophlebitis leg 09/18/2013  . Hypertension associated with diabetes ([place]) 12/03/2012  . Diabetes ([place]) 12/03/2012  . Hypothyroidism 12/03/2012  . Hyperlipidemia associated with type 2 diabetes mellitus ([place]) 12/03/2012    Gabriela Eves, PT, DPT 04/05/2020, 10:06 AM  [facility] [address] [place], [place], [address] Phone: [phone]   Fax:  [phone]  Name: JAQUANDA WICKERSHAM MRN: [id number] Date of Birth: 1946/05/04

## 2020-04-11 ENCOUNTER — Ambulatory Visit (INDEPENDENT_AMBULATORY_CARE_PROVIDER_SITE_OTHER): Payer: Medicare Other | Admitting: Family

## 2020-04-11 ENCOUNTER — Encounter: Payer: Self-pay | Admitting: Family

## 2020-04-11 ENCOUNTER — Other Ambulatory Visit: Payer: Self-pay

## 2020-04-11 VITALS — BP 132/73 | HR 58 | Temp 97.2°F | Ht 61.0 in | Wt 168.6 lb

## 2020-04-11 DIAGNOSIS — E1169 Type 2 diabetes mellitus with other specified complication: Secondary | ICD-10-CM | POA: Diagnosis not present

## 2020-04-11 DIAGNOSIS — I152 Hypertension secondary to endocrine disorders: Secondary | ICD-10-CM

## 2020-04-11 DIAGNOSIS — E039 Hypothyroidism, unspecified: Secondary | ICD-10-CM

## 2020-04-11 DIAGNOSIS — E785 Hyperlipidemia, unspecified: Secondary | ICD-10-CM

## 2020-04-11 DIAGNOSIS — E559 Vitamin D deficiency, unspecified: Secondary | ICD-10-CM | POA: Diagnosis not present

## 2020-04-11 DIAGNOSIS — T451X5A Adverse effect of antineoplastic and immunosuppressive drugs, initial encounter: Secondary | ICD-10-CM | POA: Diagnosis not present

## 2020-04-11 DIAGNOSIS — E669 Obesity, unspecified: Secondary | ICD-10-CM | POA: Diagnosis not present

## 2020-04-11 DIAGNOSIS — E538 Deficiency of other specified B group vitamins: Secondary | ICD-10-CM | POA: Diagnosis not present

## 2020-04-11 DIAGNOSIS — E1159 Type 2 diabetes mellitus with other circulatory complications: Secondary | ICD-10-CM

## 2020-04-11 DIAGNOSIS — G620 Drug-induced polyneuropathy: Secondary | ICD-10-CM

## 2020-04-11 DIAGNOSIS — C541 Malignant neoplasm of endometrium: Secondary | ICD-10-CM | POA: Diagnosis not present

## 2020-04-11 LAB — BAYER DCA HB A1C WAIVED: HB A1C (BAYER DCA - WAIVED): 7.6 % — ABNORMAL HIGH (ref <7.0)

## 2020-04-11 NOTE — Progress Notes (Signed)
Subjective:    Patient ID: Alexandria Irwin, female    DOB: [birth year], 74 y.o.   MRN: [id number]  Chief Complaint  Patient presents with  . Medical Management of Chronic Issues  . Diabetes  . Hypertension   Pt presents to the office today for chronic follow up. [name] Endometrial Adenocarcinomaand followed by Oncologists every 6 months. She has completed chemo and radiation. 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   She fell 12/2019 and broke right elbow, she fell in the shower in 07/02/2019 and broke her right shoulder. She is doing PT once a week for these.  Diabetes She presents for her follow-up diabetic visit. She has type 2 diabetes mellitus. Her disease course has been stable. Pertinent negatives for hypoglycemia include no nervousness/anxiousness. Associated symptoms include fatigue. Pertinent negatives for diabetes include no blurred vision and no foot paresthesias. There are no hypoglycemic complications. Symptoms are stable. Diabetic complications include heart disease. Risk factors for coronary artery disease include dyslipidemia, diabetes mellitus, hypertension, sedentary lifestyle and post-menopausal. She is following a generally healthy diet. Her overall blood glucose range is 110-130 mg/dl. An ACE inhibitor/angiotensin II receptor blocker is being taken. Eye exam is not current.  Hypertension This is a chronic problem. The current episode started more than 1 year ago. The problem has been resolved since onset. The problem is controlled. Pertinent negatives include no blurred vision, peripheral edema or shortness of breath. Risk factors for coronary artery disease include dyslipidemia, obesity and sedentary lifestyle. The current treatment provides moderate improvement. Identifiable causes of hypertension include a thyroid problem.  Thyroid Problem Presents for follow-up visit. Symptoms include dry skin and fatigue. Patient  reports no anxiety. The symptoms have been stable.      Review of Systems  Constitutional: Positive for fatigue.  Eyes: Negative for blurred vision.  Respiratory: Negative for shortness of breath.   Psychiatric/Behavioral: The patient is not nervous/anxious.   All other systems reviewed and are negative.      Objective:   Physical Exam Vitals reviewed.  Constitutional:      General: She is not in acute distress.    Appearance: She is well-developed.  HENT:     Head: Normocephalic and atraumatic.     Right Ear: Tympanic membrane normal.     Left Ear: Tympanic membrane normal.  Eyes:     Pupils: Pupils are equal, round, and reactive to light.  Neck:     Thyroid: No thyromegaly.  Cardiovascular:     Rate and Rhythm: Normal rate and regular rhythm.     Heart sounds: Normal heart sounds. No murmur heard.   Pulmonary:     Effort: Pulmonary effort is normal. No respiratory distress.     Breath sounds: Normal breath sounds. No wheezing.  Abdominal:     General: Bowel sounds are normal. There is no distension.     Palpations: Abdomen is soft.     Tenderness: There is no abdominal tenderness.  Musculoskeletal:        General: Tenderness present.     Cervical back: Normal range of motion and neck supple.     Comments: Decreased ROM in right shoulder and elbow extension  Skin:    General: Skin is warm and dry.  Neurological:     Mental Status: She is alert and oriented to person, place, and time.     Cranial Nerves: No cranial nerve deficit.     Deep Tendon Reflexes:  Reflexes are normal and symmetric.  Psychiatric:        Behavior: Behavior normal.        Thought Content: Thought content normal.        Judgment: Judgment normal.       BP 132/73   Pulse (!) 58   Temp (!) 97.2 F (36.2 C) (Temporal)   Ht '5\' 1"'  (1.549 m)   Wt 168 lb 9.6 oz (76.5 kg)   SpO2 98%   BMI 31.86 kg/m      Assessment & Plan:  Alexandria Irwin comes in today with chief complaint of Medical  Management of Chronic Issues, Diabetes, and Hypertension   Diagnosis and orders addressed:  1. Type 2 diabetes mellitus with other specified complication, without long-term current use of insulin (HCC) - Bayer DCA Hb A1c Waived - CMP14+EGFR  2. Hypertension associated with diabetes ([place]) - CMP14+EGFR  3. Hyperlipidemia associated with type 2 diabetes mellitus (HCC) - CMP14+EGFR  4. Hypothyroidism, unspecified type - CMP14+EGFR  5. Peripheral neuropathy due to chemotherapy (HCC) - CMP14+EGFR  6. Endometrial adenocarcinoma ([place]) - CMP14+EGFR  7. Obesity (BMI 30-39.9) - CMP14+EGFR  8. B12 deficiency - CMP14+EGFR  9. Vitamin D deficiency - CMP14+EGFR   Labs pending Health Maintenance reviewed Diet and exercise encouraged  Follow up plan: 4 months    Evelina Dun, FNP

## 2020-04-11 NOTE — Patient Instructions (Signed)

## 2020-04-12 ENCOUNTER — Ambulatory Visit: Payer: Medicare Other | Admitting: Physical Therapy

## 2020-04-12 ENCOUNTER — Other Ambulatory Visit: Payer: Self-pay

## 2020-04-12 ENCOUNTER — Encounter: Payer: Self-pay | Admitting: Physical Therapy

## 2020-04-12 DIAGNOSIS — M6281 Muscle weakness (generalized): Secondary | ICD-10-CM | POA: Diagnosis not present

## 2020-04-12 DIAGNOSIS — M25621 Stiffness of right elbow, not elsewhere classified: Secondary | ICD-10-CM

## 2020-04-12 DIAGNOSIS — M25521 Pain in right elbow: Secondary | ICD-10-CM | POA: Diagnosis not present

## 2020-04-12 LAB — CMP14+EGFR
ALT: 21 IU/L (ref 0–32)
AST: 22 IU/L (ref 0–40)
Albumin/Globulin Ratio: 1.7 (ref 1.2–2.2)
Albumin: 4.5 g/dL (ref 3.7–4.7)
Alkaline Phosphatase: 46 IU/L (ref 44–121)
BUN/Creatinine Ratio: 11 — ABNORMAL LOW (ref 12–28)
BUN: 13 mg/dL (ref 8–27)
Bilirubin Total: 0.7 mg/dL (ref 0.0–1.2)
CO2: 25 mmol/L (ref 20–29)
Calcium: 10.1 mg/dL (ref 8.7–10.3)
Chloride: 101 mmol/L (ref 96–106)
Creatinine, Ser: 1.15 mg/dL — ABNORMAL HIGH (ref 0.57–1.00)
GFR calc Af Amer: 53 mL/min/{1.73_m2} — ABNORMAL LOW (ref >59)
GFR calc non Af Amer: 46 mL/min/{1.73_m2} — ABNORMAL LOW (ref >59)
Globulin, Total: 2.7 g/dL (ref 1.5–4.5)
Glucose: 167 mg/dL — ABNORMAL HIGH (ref 65–99)
Potassium: 4.2 mmol/L (ref 3.5–5.2)
Sodium: 142 mmol/L (ref 134–144)
Total Protein: 7.2 g/dL (ref 6.0–8.5)

## 2020-04-12 NOTE — Therapy (Signed)
[facility] [place], [place], [address] Phone: [phone]   Fax:  [phone]  Physical Therapy Treatment  Patient Details  Name: Alexandria Irwin MRN: [id number] Date of Birth: 16-May-1946 Referring Provider (PT): Esmond Plants, MD   Encounter Date: 04/12/2020   PT End of Session - 04/12/20 1032    Visit Number 3    Number of Visits 4    Date for PT Re-Evaluation 05/03/20    Authorization Type Progress note every 10th visit    PT Start Time 0900    PT Stop Time 0948    PT Time Calculation (min) 48 min    Activity Tolerance Patient tolerated treatment well    Behavior During Therapy [facility] for tasks assessed/performed           Past Medical History:  Diagnosis Date  . Cataract   . Endometrial adenocarcinoma ([place])   . Environmental and seasonal allergies   . Fall 07/2019  . Family history of ovarian cancer   . Family history of pancreatic cancer   . Fx anat neck humerus-open 07/2019  . Genetic testing 09/19/2017   Multi-Cancer panel (83 genes) @ [facility] - No pathogenic mutations detected  . GERD (gastroesophageal reflux disease)   . History of DVT of lower extremity    early 2000s--- lower right leg  . Hyperlipidemia   . Hypertension   . Hypothyroidism   . Type 2 diabetes mellitus ([place])    followed by pcp  . Umbilical hernia   . Vitamin B 12 deficiency   . Vitamin D deficiency   . Wears glasses     Past Surgical History:  Procedure Laterality Date  . BREAST BIOPSY Left 2009   benign  . CATARACT EXTRACTION W/ INTRAOCULAR LENS IMPLANT Left 2018  . CATARACT EXTRACTION W/PHACO Right 03/14/2014   Procedure: CATARACT EXTRACTION PHACO AND INTRAOCULAR LENS PLACEMENT (IOC);  Surgeon: Tonny Branch, MD;  Location: [facility];  Service: Ophthalmology;  Laterality: Right;  CDE: 8.34  . FOOT GANGLION EXCISION Right 2013  . HYSTEROSCOPY WITH D & C N/A 06/13/2017   Procedure: DILATATION AND CURETTAGE /HYSTEROSCOPY;  Surgeon:  Alden Hipp, MD;  Location: [facility];  Service: Gynecology;  Laterality: N/A;  . IR FLUORO GUIDE PORT INSERTION RIGHT  08/27/2017  . IR REMOVAL TUN ACCESS W/ PORT W/O FL MOD SED  04/27/2018  . IR US GUIDE VASC ACCESS RIGHT  08/27/2017  . LAPAROSCOPIC CHOLECYSTECTOMY  2005  . ROBOTIC ASSISTED TOTAL HYSTERECTOMY WITH BILATERAL SALPINGO OOPHERECTOMY Bilateral 08/05/2017   Procedure: ROBOTIC ASSISTED TOTAL HYSTERECTOMY WITH BILATERAL SALPINGO OOPHORECTOMY;  Surgeon: Everitt Amber, MD;  Location: [facility];  Service: Gynecology;  Laterality: Bilateral;  . SENTINEL NODE BIOPSY Bilateral 08/05/2017   Procedure: SENTINEL NODE BIOPSY;  Surgeon: Everitt Amber, MD;  Location: [facility];  Service: Gynecology;  Laterality: Bilateral;  . UMBILICAL HERNIA REPAIR N/A 08/05/2017   Procedure: HERNIA REPAIR UMBILICAL ADULT;  Surgeon: Everitt Amber, MD;  Location: [facility];  Service: Gynecology;  Laterality: N/A;  . VEIN SURGERY Right 2011   vein stripping-leg  and laser treatment    There were no vitals filed for this visit.   Subjective Assessment - 04/12/20 1030    Subjective COVID-19 screening performed upon arrival. Patient reports ability to do more home activities but still requires assistance from L arm for certain activities. Patient to see MD for follow up on 04/18/2020    Pertinent History right elbow fracture with routine healing; right humeral head  fracture 07/02/2019, HTN, DM    Limitations Lifting;Writing;House hold activities    Diagnostic tests x-ray: right elbow fracture, routine healing    Patient Stated Goals "get back to normal routine"    Currently in Pain? No/denies              [facility] PT Assessment - 04/12/20 0001      Assessment   Medical Diagnosis Right elbow pain    Referring Provider (PT) Esmond Plants, MD    Onset Date/Surgical Date 07/02/19    Hand Dominance Right    Next MD Visit 04/18/2020    Prior Therapy for shoulder      Precautions   Precautions Other (comment)    Precaution  Comments slow progression, PROM then transition to HEP per referral      PROM   Right Elbow Extension -16                                      PT Long Term Goals - 04/12/20 1040      PT LONG TERM GOAL #1   Title Patient will be independent HEP    Time 4    Period Weeks    Status Partially Met      PT LONG TERM GOAL #2   Title Patient will demonstrate 0-155 degress of right elbow AROM to improve ability to perform functional tasks.    Time 4    Period Weeks    Status On-going      PT LONG TERM GOAL #3   Title Patient will demonstrate 30+ lbs of right grip strength to improve strength for functional tasks.    Time 4    Period Weeks    Status On-going      PT LONG TERM GOAL #4   Title Patient will report ability to perform ADLs and home activities with right elbow pain less than or equal to 3/10.    Time 4    Period Weeks    Status On-going                 Plan - 04/12/20 1033    Clinical Impression Statement Patient was able to tolerate treatment well but with reports of tightness and slight increase of discomfort with PROM. Patient provided with low load long duration stretching with STW/M to biceps with good response but not able to achieve full R elbow PROM at this time. Goals ongoing at this time. Normal response to modalities upon removal.    Personal Factors and Comorbidities Age;Comorbidity 2    Comorbidities right humeral head fracture 07/02/2019, HTN, DM    Examination-Activity Limitations Bathing;Dressing;Hygiene/Grooming;Lift;Carry    Examination-Participation Restrictions Driving    Stability/Clinical Decision Making Stable/Uncomplicated    Clinical Decision Making Low    Rehab Potential Good    PT Frequency 3x / week    PT Duration 4 weeks    PT Treatment/Interventions ADLs/Self Care Home Management;Iontophoresis 87m/ml Dexamethasone;Moist Heat;Electrical Stimulation;Cryotherapy;Therapeutic activities;Therapeutic  exercise;Neuromuscular re-education;Manual techniques;Passive range of motion;Patient/family education    PT Next Visit Plan Patient to see MD for follow up visit; continue PT pending MD discretion: Right elbow, shoulder and forearm PROM to improve range of motion per MD referral. Modalities PRN for pain relief.    PT Home Exercise Plan see patient education section    Consulted and Agree with Plan of Care Patient  Patient will benefit from skilled therapeutic intervention in order to improve the following deficits and impairments:  Decreased activity tolerance, Decreased strength, Decreased range of motion, Impaired UE functional use, Pain, Postural dysfunction  Visit Diagnosis: Pain in right elbow  Stiffness of right elbow, not elsewhere classified  Muscle weakness (generalized)     Problem List Patient Active Problem List   Diagnosis Date Noted  . Anemia due to antineoplastic chemotherapy 11/26/2017  . Peripheral neuropathy due to chemotherapy ([place]) 10/14/2017  . Genetic testing 09/19/2017  . Family history of ovarian cancer   . Family history of pancreatic cancer   . Obesity (BMI 30-39.9) 08/26/2017  . Easy bruising 08/18/2017  . Endometrial cancer, FIGO stage IIIA ([place]) 08/12/2017  . Endometrial cancer ([place]) 08/05/2017  . B12 deficiency 06/26/2017  . Endometrial adenocarcinoma ([place]) 06/25/2017  . Umbilical hernia without obstruction and without gangrene 06/25/2017  . Vitamin D deficiency   . Thrombophlebitis leg 09/18/2013  . Hypertension associated with diabetes ([place]) 12/03/2012  . Diabetes ([place]) 12/03/2012  . Hypothyroidism 12/03/2012  . Hyperlipidemia associated with type 2 diabetes mellitus ([place]) 12/03/2012    Gabriela Eves, PT, DPT 04/12/2020, 10:42 AM  [facility] [address] [place], [place], [address] Phone: [phone]   Fax:  [phone]  Name: RIANA TESSMER MRN: [id number] Date of Birth:  1945-11-17

## 2020-04-13 ENCOUNTER — Other Ambulatory Visit: Payer: Self-pay | Admitting: *Deleted

## 2020-04-13 ENCOUNTER — Other Ambulatory Visit: Payer: Self-pay | Admitting: Family

## 2020-04-13 DIAGNOSIS — R42 Dizziness and giddiness: Secondary | ICD-10-CM

## 2020-04-13 MED ORDER — FUROSEMIDE 20 MG PO TABS: 10.0000 mg | ORAL_TABLET | Freq: Every day | ORAL | 1 refills | Status: DC

## 2020-04-14 ENCOUNTER — Inpatient Hospital Stay: Payer: Medicare Other | Attending: Gynecologic Oncology | Admitting: Gynecologic Oncology

## 2020-04-14 ENCOUNTER — Encounter: Payer: Self-pay | Admitting: Gynecologic Oncology

## 2020-04-14 ENCOUNTER — Other Ambulatory Visit: Payer: Self-pay

## 2020-04-14 VITALS — BP 137/59 | HR 58 | Resp 18 | Wt 170.2 lb

## 2020-04-14 DIAGNOSIS — Z923 Personal history of irradiation: Secondary | ICD-10-CM | POA: Diagnosis not present

## 2020-04-14 DIAGNOSIS — Z79899 Other long term (current) drug therapy: Secondary | ICD-10-CM | POA: Diagnosis not present

## 2020-04-14 DIAGNOSIS — C541 Malignant neoplasm of endometrium: Secondary | ICD-10-CM

## 2020-04-14 DIAGNOSIS — Z90710 Acquired absence of both cervix and uterus: Secondary | ICD-10-CM | POA: Insufficient documentation

## 2020-04-14 DIAGNOSIS — Z8542 Personal history of malignant neoplasm of other parts of uterus: Secondary | ICD-10-CM | POA: Diagnosis not present

## 2020-04-14 DIAGNOSIS — D398 Neoplasm of uncertain behavior of other specified female genital organs: Secondary | ICD-10-CM | POA: Insufficient documentation

## 2020-04-14 DIAGNOSIS — E559 Vitamin D deficiency, unspecified: Secondary | ICD-10-CM | POA: Insufficient documentation

## 2020-04-14 DIAGNOSIS — K219 Gastro-esophageal reflux disease without esophagitis: Secondary | ICD-10-CM | POA: Diagnosis not present

## 2020-04-14 DIAGNOSIS — Z7984 Long term (current) use of oral hypoglycemic drugs: Secondary | ICD-10-CM | POA: Insufficient documentation

## 2020-04-14 DIAGNOSIS — Z90722 Acquired absence of ovaries, bilateral: Secondary | ICD-10-CM | POA: Diagnosis not present

## 2020-04-14 DIAGNOSIS — I10 Essential (primary) hypertension: Secondary | ICD-10-CM | POA: Insufficient documentation

## 2020-04-14 DIAGNOSIS — N898 Other specified noninflammatory disorders of vagina: Secondary | ICD-10-CM

## 2020-04-14 DIAGNOSIS — E785 Hyperlipidemia, unspecified: Secondary | ICD-10-CM | POA: Insufficient documentation

## 2020-04-14 DIAGNOSIS — N760 Acute vaginitis: Secondary | ICD-10-CM | POA: Diagnosis not present

## 2020-04-14 DIAGNOSIS — Z9221 Personal history of antineoplastic chemotherapy: Secondary | ICD-10-CM | POA: Diagnosis not present

## 2020-04-14 DIAGNOSIS — E7800 Pure hypercholesterolemia, unspecified: Secondary | ICD-10-CM | POA: Diagnosis not present

## 2020-04-14 DIAGNOSIS — E1136 Type 2 diabetes mellitus with diabetic cataract: Secondary | ICD-10-CM | POA: Diagnosis not present

## 2020-04-14 DIAGNOSIS — E669 Obesity, unspecified: Secondary | ICD-10-CM | POA: Insufficient documentation

## 2020-04-14 DIAGNOSIS — E538 Deficiency of other specified B group vitamins: Secondary | ICD-10-CM | POA: Diagnosis not present

## 2020-04-14 DIAGNOSIS — E039 Hypothyroidism, unspecified: Secondary | ICD-10-CM | POA: Diagnosis not present

## 2020-04-14 DIAGNOSIS — Z08 Encounter for follow-up examination after completed treatment for malignant neoplasm: Secondary | ICD-10-CM | POA: Insufficient documentation

## 2020-04-14 DIAGNOSIS — Z86718 Personal history of other venous thrombosis and embolism: Secondary | ICD-10-CM | POA: Diagnosis not present

## 2020-04-14 NOTE — Progress Notes (Signed)
Follow-up Note: Gyn-Onc  Consult was requested by Dr. Deatra Ina for the evaluation of Alexandria Irwin 74 y.o. female  CC:  Chief Complaint  Patient presents with  . Endometrial adenocarcinoma    Assessment/Plan:  Alexandria Irwin  is a 74 y.o.  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 Genetic testing negative.  S/p adjuvant carboplatin and paclitaxel chemotherapy x 6 cycles with adjuvant vaginal brachytherapy for local control completed in June, 2019.  Complete clinical response.  Will follow-up with today's biopsy, I suspect this is granulation tissue or erythematous changes from radiation. Alexandria Irwin can now start transitioning to 6 monthly evaluations alternating with Dr. Sondra Come and myself.  HPI: Alexandria Irwin is a 74 year old para 4 who was seen in consultation at the request of Dr. Deatra Ina for grade 1 endometrial cancer.  The patient has a history of postmenopausal bleeding since November 2017.  At this time she saw her primary care doctor who diagnosed a urinary tract infection and treated her intermittently with antibiotics for approximately 3 months.  However given the persistence of the symptoms she then sought evaluation with her gynecologist Dr. Deatra Ina in April 2018.  At this time a transvaginal ultrasound scan was performed which showed some fibroids in the uterus but no thickening of the endometrium.  Pap smear and pelvic examination were unremarkable and therefore it was felt that the source of bleeding was not gynecologic.  She was then sent to Dr. Noah Delaine from [facility] urology who performed an evaluation including a CT scan of the abdomen and pelvis in September 2018.  This showed grossly normal urologic system and no evidence of extrauterine disease however the uterus was slightly 4 cm intrauterine mass concerning for neoplasm.  Dr. Alyson Ingles then performed a cystoscopy which was also  unremarkable.  She was referred back to Dr. Deatra Ina for further evaluation.  In October 2018 Dr. Deatra Ina repeated the pelvic ultrasound scan which again confirmed what appeared to be a thin endometrium with fibroids.  However given the persistence of bleeding and the CT findings he took the patient to the operating room on June 13, 2017 and performed a hysteroscopy D&C and polypectomy.  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  The patient otherwise has medical history significant for a DVT in the right lower extremity approximately 12 years ago that was spontaneous.  She was treated with aspirin 325 mg daily but no anti-thrombotic agents.  She has had no recurrences since that time.  She has a history of type 2 diabetes mellitus with fairly poorly controlled with an HbA1c earlier in 2018 of 9.2%.  At that time she is taking only metformin.  She was then added Januvia.  Follow-up HbA1c in November 2018 had reduced to 7.2%.  The patient reports fasting blood glucose levels between 07/31/1950.  Her primary care doctor Dr. Lenna Gilford manages her blood glucose.  Patient also has hypertension hypercholesterolemia.  She denies a coronary artery disease history.  She has had 4 prior vaginal deliveries.  She has no family history concerning for Lynch syndrome.  She is obese with a height of 5 foot 2 inches and a weight of 174 pounds.  Her prior abdominal surgery includes a laparoscopic cholecystectomy.  She has an umbilical hernia. On 08/05/17 she underwent a robotic assisted total hysterectomy, BSO, sentinel lymph node biopsy.  Surgery also included an umbilical hernia repair.  The final pathology revealed a uterus  with a FIGO grade 2 endometrioid adenocarcinoma involving the full-thickness of the posterior myometrium with tumor present at the inked serosal surface.  There was also microscopic involvement of the left fallopian tube.  Sentinel lymph nodes bilaterally were negative as was the  cervix.  Lymphovascular space invasion was not identified.  The patient was staged as having stage III a grade 2 endometrioid adenocarcinoma and was recommended to proceed with adjuvant therapy with carboplatin paclitaxel chemotherapy and vaginal brachytherapy.  She demonstrated loss of mL H1 nuclear expression on her pathology and therefore genetics consultation was recommended, but no genetic mutations were identified on comprehensive testing in March, 2019.  She went on to complete 6 cycles of carb/taxol chemotherapy (completed June, 2019) and 30Gy (in 5 fractions) of vaginal brachytherapy. She tolerated therapy well.  Post treatment imaging with CT abd/pelvis on 01/13/18 showed bilateral lymphocysts measuring up to 5cm. These had slightly increased from postop imaging. Her bladder wall was also thickened. No definitive recurrent/persistent tumor. Follow-up CT imaging in October, 2019 showed reduction in size of lymphocysts.   The patient reports a history of a right DVT "years ago" and chronic right LE edema since that time. She has mild intermittent left foot and ankle swelling. It has been stable.  Interval Hx:  She noticed one smear of blood associated with a UTI earlier in 2021. She returned for routine follow-up.  Current Meds:  Outpatient Encounter Medications as of 04/14/2020  Medication Sig  . acetaminophen (TYLENOL) 500 MG tablet Take 1,000 mg by mouth every 8 (eight) hours as needed for mild pain or headache.  . Ascorbic Acid (VITAMIN C) 100 MG CHEW Vitamin C  . aspirin EC 81 MG tablet Take 81 mg by mouth daily.  . Calcium Citrate-Vitamin D ([employer] CALCIUM CITRATE +D PO) Take 2 tablets by mouth every evening.   . cycloSPORINE (RESTASIS) 0.05 % ophthalmic emulsion Place 1 drop into both eyes 2 (two) times daily.   . dapagliflozin propanediol (FARXIGA) 10 MG TABS tablet Take 1 tablet (10 mg total) by mouth daily before breakfast. (Patient taking differently: Take 5 mg by mouth daily  before breakfast. )  . furosemide (LASIX) 20 MG tablet Take 0.5 tablets (10 mg total) by mouth daily.  Marland Kitchen glimepiride (AMARYL) 4 MG tablet Take 1 tablet (4 mg total) by mouth daily with breakfast.  . glucose blood (ONETOUCH VERIO) test strip Test BS daily Dx E11.9  . Lancets (ONETOUCH DELICA PLUS [id number]) MISC 1 each by Other route daily. Dx E11.9  . levothyroxine (SYNTHROID) 88 MCG tablet Take 1 tablet (88 mcg total) by mouth daily.  Marland Kitchen losartan (COZAAR) 100 MG tablet Take 1 tablet (100 mg total) by mouth daily.  . Magnesium 250 MG TABS Take 250 mg by mouth every evening.   . meclizine (ANTIVERT) 25 MG tablet TAKE 1 TABLET (25 MG TOTAL) BY MOUTH 2 (TWO) TIMES DAILY AS NEEDED FOR DIZZINESS.  . metFORMIN (GLUCOPHAGE XR) 500 MG 24 hr tablet Take 2 tablets (1,000 mg total) by mouth daily with breakfast.  . metoprolol succinate (TOPROL-XL) 50 MG 24 hr tablet Take 1 tablet (50 mg total) by mouth 2 (two) times daily. Take with or immediately following a meal.  . Omega-3 Fatty Acids (FISH OIL) 1000 MG CAPS Take 1,000 mg by mouth every evening.  . simvastatin (ZOCOR) 40 MG tablet TAKE 1 TABLET BY MOUTH ONCE DAILY AT 6PM  . Vitamin D, Ergocalciferol, (DRISDOL) 1.25 MG (50000 UNIT) CAPS capsule Take 1 capsule (50,000 Units  total) by mouth every 7 (seven) days.  . [DISCONTINUED] furosemide (LASIX) 20 MG tablet TAKE 1 TABLET BY MOUTH EVERY DAY  . [DISCONTINUED] metoprolol (LOPRESSOR) 50 MG tablet TAKE 1 TABLET (50 MG TOTAL) BY MOUTH 2 (TWO) TIMES DAILY.   No facility-administered encounter medications on file as of 04/14/2020.    Allergy:  Allergies  Allergen Reactions  . Ace Inhibitors Cough  . Ciprocin-Fluocin-Procin [Fluocinolone] Hives  . Norvasc [Amlodipine] Cough  . Latex Rash  . Neosporin [Neomycin-Bacitracin Zn-Polymyx] Rash  . Sulfa Antibiotics Rash    Social Hx:   Social History   Socioeconomic History  . Marital status: Married    Spouse name: Charlotte Crumb  . Number of children: 4  .  Years of education: Not on file  . Highest education level: Some college, no degree  Occupational History  . Occupation: retired  Tobacco Use  . Smoking status: Never Smoker  . Smokeless tobacco: Never Used  Vaping Use  . Vaping Use: Never used  Substance and Sexual Activity  . Alcohol use: No  . Drug use: No  . Sexual activity: Yes    Birth control/protection: Post-menopausal  Other Topics Concern  . Not on file  Social History Narrative  . Not on file   Social Determinants of Health   Financial Resource Strain:   . Difficulty of Paying Living Expenses: Not on file  Food Insecurity:   . Worried About Charity fundraiser in the Last Year: Not on file  . Ran Out of Food in the Last Year: Not on file  Transportation Needs:   . Lack of Transportation (Medical): Not on file  . Lack of Transportation (Non-Medical): Not on file  Physical Activity:   . Days of Exercise per Week: Not on file  . Minutes of Exercise per Session: Not on file  Stress:   . Feeling of Stress : Not on file  Social Connections:   . Frequency of Communication with Friends and Family: Not on file  . Frequency of Social Gatherings with Friends and Family: Not on file  . Attends Religious Services: Not on file  . Active Member of Clubs or Organizations: Not on file  . Attends Archivist Meetings: Not on file  . Marital Status: Not on file  Intimate Partner Violence:   . Fear of Current or Ex-Partner: Not on file  . Emotionally Abused: Not on file  . Physically Abused: Not on file  . Sexually Abused: Not on file    Past Surgical Hx:  Past Surgical History:  Procedure Laterality Date  . BREAST BIOPSY Left 2009   benign  . CATARACT EXTRACTION W/ INTRAOCULAR LENS IMPLANT Left 2018  . CATARACT EXTRACTION W/PHACO Right 03/14/2014   Procedure: CATARACT EXTRACTION PHACO AND INTRAOCULAR LENS PLACEMENT (IOC);  Surgeon: Tonny Branch, MD;  Location: [facility];  Service: Ophthalmology;  Laterality: Right;   CDE: 8.34  . FOOT GANGLION EXCISION Right 2013  . HYSTEROSCOPY WITH D & C N/A 06/13/2017   Procedure: DILATATION AND CURETTAGE /HYSTEROSCOPY;  Surgeon: Alden Hipp, MD;  Location: [facility];  Service: Gynecology;  Laterality: N/A;  . IR FLUORO GUIDE PORT INSERTION RIGHT  08/27/2017  . IR REMOVAL TUN ACCESS W/ PORT W/O FL MOD SED  04/27/2018  . IR US GUIDE VASC ACCESS RIGHT  08/27/2017  . LAPAROSCOPIC CHOLECYSTECTOMY  2005  . ROBOTIC ASSISTED TOTAL HYSTERECTOMY WITH BILATERAL SALPINGO OOPHERECTOMY Bilateral 08/05/2017   Procedure: ROBOTIC ASSISTED TOTAL HYSTERECTOMY WITH BILATERAL SALPINGO OOPHORECTOMY;  Surgeon: Everitt Amber, MD;  Location[facility];  Service: Gynecology;  Laterality: Bilateral;  . SENTINEL NODE BIOPSY Bilateral 08/05/2017   Procedure: SENTINEL NODE BIOPSY;  Surgeon: Everitt Amber, MD;  [facility];  Service: Gynecology;  Laterality: Bilateral;  . UMBILICAL HERNIA REPAIR N/A 08/05/2017   Procedure: HERNIA REPAIR UMBILICAL ADULT;  Surgeon: Everitt Amber, MD;  [facility];  Service: Gynecology;  Laterality: N/A;  . VEIN SURGERY Right 2011   vein stripping-leg  and laser treatment    Past Medical Hx:  Past Medical History:  Diagnosis Date  . Cataract   . Endometrial adenocarcinoma ([place])   . Environmental and seasonal allergies   . Fall 07/2019  . Family history of ovarian cancer   . Family history of pancreatic cancer   . Fx anat neck humerus-open 07/2019  . Genetic testing 09/19/2017   Multi-Cancer panel (83 genes) @ [facility] - No pathogenic mutations detected  . GERD (gastroesophageal reflux disease)   . History of DVT of lower extremity    early 2000s--- lower right leg  . Hyperlipidemia   . Hypertension   . Hypothyroidism   . Type 2 diabetes mellitus ([place])    followed by pcp  . Umbilical hernia   . Vitamin B 12 deficiency   . Vitamin D deficiency   . Wears glasses     Past Gynecological History:  SVD x 4 No LMP recorded. Patient has had a  hysterectomy.  Family Hx:  Family History  Problem Relation Age of Onset  . Pancreatic cancer Mother 52       deceased 10  . Ovarian cancer Mother        dx 23s  . Cancer Other 58       maternal half-sister; unk. type    Review of Systems:  Constitutional  Feels well,    ENT Normal appearing ears and nares bilaterally Skin/Breast  No rash, sores, jaundice, itching, dryness Cardiovascular  No chest pain, shortness of breath, or edema  Pulmonary  No cough or wheeze.  Gastro Intestinal  No nausea, vomitting, or diarrhoea. No bright red blood per rectum, no abdominal pain, change in bowel movement, or constipation.  Genito Urinary  No frequency, urgency, dysuria,no bleeding Musculo Skeletal  No myalgia, arthralgia, joint swelling or pain  Neurologic  No weakness, numbness, change in gait,  Psychology  No depression, anxiety, insomnia.   Vitals:  Blood pressure (!) 137/59, pulse (!) 58, resp. rate 18, weight 170 lb 3.2 oz (77.2 kg), SpO2 97 %.  Physical Exam: WD in NAD Neck  Supple NROM, without any enlargements.  Lymph Node Survey No cervical supraclavicular or inguinal adenopathy Cardiovascular  Pulse normal rate, regularity and rhythm. S1 and S2 normal.  Lungs  Clear to auscultation bilateraly, without wheezes/crackles/rhonchi. Good air movement.  Skin  No rash/lesions/breakdown  Psychiatry  Alert and oriented to person, place, and time  Abdomen  Normoactive bowel sounds, abdomen soft, non-tender and obese without hernia. Soft healed incisions.  Back No CVA tenderness Genito Urinary  Vulva/vagina: vaginal cuff smoothe, there is a mildly erythematous lesion at the right vaginal fornix.  This most consistent with granulation tissue.  A biopsy was taken see below. Rectal  deferred Extremities  No bilateral cyanosis, clubbing or edema.  Procedure Note:  Preop Dx: Vaginal mass, history of endometrial cancer Postop Dx: Same Procedure: Vaginal biopsy Surgeon:  Dorann Ou, MD EBL: Scant Specimens: Vaginal biopsy Complications: None Procedure Details: The patient provided verbal consent and verbal  timeout was performed.  Speculum was inserted and the lesion was identified at the top of the right vaginal fornix.  The Rainbow forcep was used to take a representative biopsy from the area of erythema.  Hemostasis was was was obtained with silver nitrate.  Patient tolerated the procedure well with no ongoing bleeding.  The specimen was sent for histopathology.    Thereasa Solo, MD  04/14/2020, 2:50 PM

## 2020-04-14 NOTE — Patient Instructions (Addendum)
Please notify Dr Denman George at phone number [phone] if you notice vaginal bleeding, new pelvic or abdominal pains, bloating, feeling full easy, or a change in bladder or bowel function.   Please have Dr Clabe Seal office contact Dr Serita Grit office (at [phone]) after your appointment with him to request an appointment with Dr Denman George for 6 months after that appointment.  Her office will call you with today's biopsy results.

## 2020-04-17 LAB — SURGICAL PATHOLOGY

## 2020-04-18 ENCOUNTER — Telehealth: Payer: Self-pay

## 2020-04-18 ENCOUNTER — Other Ambulatory Visit: Payer: Self-pay | Admitting: Family

## 2020-04-18 DIAGNOSIS — M25521 Pain in right elbow: Secondary | ICD-10-CM | POA: Diagnosis not present

## 2020-04-18 DIAGNOSIS — R42 Dizziness and giddiness: Secondary | ICD-10-CM

## 2020-04-18 NOTE — Telephone Encounter (Signed)
Told Ms Neira that the biopsy showed no evidence of cancer or pre cancer. It is showing inflammation per Joylene John, NP.  Have Dr. Clabe Seal office at 11-09-20 appointemnt  call Dr. Serita Grit office to schedule an appointment in November for follow up. Pt verbalized understanding.

## 2020-04-19 ENCOUNTER — Encounter: Payer: Self-pay | Admitting: Physical Therapy

## 2020-04-19 ENCOUNTER — Other Ambulatory Visit: Payer: Self-pay

## 2020-04-19 ENCOUNTER — Ambulatory Visit: Payer: Medicare Other | Admitting: Physical Therapy

## 2020-04-19 DIAGNOSIS — M25521 Pain in right elbow: Secondary | ICD-10-CM | POA: Diagnosis not present

## 2020-04-19 DIAGNOSIS — M25621 Stiffness of right elbow, not elsewhere classified: Secondary | ICD-10-CM | POA: Diagnosis not present

## 2020-04-19 DIAGNOSIS — M6281 Muscle weakness (generalized): Secondary | ICD-10-CM

## 2020-04-19 NOTE — Therapy (Signed)
[facility] [place], [place], [address] Phone: [phone]   Fax:  [phone]  Physical Therapy Treatment  Patient Details  Name: Alexandria Irwin MRN: [id number] Date of Birth: 1946-05-25 Referring Provider (PT): Esmond Plants, MD   Encounter Date: 04/19/2020   PT End of Session - 04/19/20 1030    Visit Number 4    Number of Visits 5   per new referral   Date for PT Re-Evaluation 05/03/20    Authorization Type Progress note every 10th visit    PT Start Time 0900    PT Stop Time 0948    PT Time Calculation (min) 48 min    Activity Tolerance Patient tolerated treatment well    Behavior During Therapy [facility] for tasks assessed/performed           Past Medical History:  Diagnosis Date  . Cataract   . Endometrial adenocarcinoma ([place])   . Environmental and seasonal allergies   . Fall 07/2019  . Family history of ovarian cancer   . Family history of pancreatic cancer   . Fx anat neck humerus-open 07/2019  . Genetic testing 09/19/2017   Multi-Cancer panel (83 genes) @ [facility] - No pathogenic mutations detected  . GERD (gastroesophageal reflux disease)   . History of DVT of lower extremity    early 2000s--- lower right leg  . Hyperlipidemia   . Hypertension   . Hypothyroidism   . Type 2 diabetes mellitus ([place])    followed by pcp  . Umbilical hernia   . Vitamin B 12 deficiency   . Vitamin D deficiency   . Wears glasses     Past Surgical History:  Procedure Laterality Date  . BREAST BIOPSY Left 2009   benign  . CATARACT EXTRACTION W/ INTRAOCULAR LENS IMPLANT Left 2018  . CATARACT EXTRACTION W/PHACO Right 03/14/2014   Procedure: CATARACT EXTRACTION PHACO AND INTRAOCULAR LENS PLACEMENT (IOC);  Surgeon: Tonny Branch, MD;  Location: [facility];  Service: Ophthalmology;  Laterality: Right;  CDE: 8.34  . FOOT GANGLION EXCISION Right 2013  . HYSTEROSCOPY WITH D & C N/A 06/13/2017   Procedure: DILATATION AND CURETTAGE  /HYSTEROSCOPY;  Surgeon: Alden Hipp, MD;  Location: [place] [facility];  Service: Gynecology;  Laterality: N/A;  . IR FLUORO GUIDE PORT INSERTION RIGHT  08/27/2017  . IR REMOVAL TUN ACCESS W/ PORT W/O FL MOD SED  04/27/2018  . IR US GUIDE VASC ACCESS RIGHT  08/27/2017  . LAPAROSCOPIC CHOLECYSTECTOMY  2005  . ROBOTIC ASSISTED TOTAL HYSTERECTOMY WITH BILATERAL SALPINGO OOPHERECTOMY Bilateral 08/05/2017   Procedure: ROBOTIC ASSISTED TOTAL HYSTERECTOMY WITH BILATERAL SALPINGO OOPHORECTOMY;  Surgeon: Everitt Amber, MD;  Location: [facility];  Service: Gynecology;  Laterality: Bilateral;  . SENTINEL NODE BIOPSY Bilateral 08/05/2017   Procedure: SENTINEL NODE BIOPSY;  Surgeon: Everitt Amber, MD;  Location: [facility];  Service: Gynecology;  Laterality: Bilateral;  . UMBILICAL HERNIA REPAIR N/A 08/05/2017   Procedure: HERNIA REPAIR UMBILICAL ADULT;  Surgeon: Everitt Amber, MD;  Location: [facility];  Service: Gynecology;  Laterality: N/A;  . VEIN SURGERY Right 2011   vein stripping-leg  and laser treatment    There were no vitals filed for this visit.   Subjective Assessment - 04/19/20 1027    Subjective COVID-19 screening performed upon arrival. Patient reports MD follow up went well and fracture is healing well. Patient reports to complete 1 more session then DC per MD.    Pertinent History right elbow fracture with routine healing; right humeral head  fracture 07/02/2019, HTN, DM    Limitations Lifting;Writing;House hold activities    Diagnostic tests x-ray: right elbow fracture, routine healing    Patient Stated Goals "get back to normal routine"    Currently in Pain? No/denies                                     PT Education - 04/19/20 1029    Education Details elbow extension/flexion in supine and standing,  elbow extension self stretch, AAROM elbow extension table slides; slow progression into functional activites using pain as her guide.    Person(s) Educated Patient    Methods  Explanation;Demonstration;Handout    Comprehension Verbalized understanding;Returned demonstration               PT Long Term Goals - 04/12/20 1040      PT LONG TERM GOAL #1   Title Patient will be independent HEP    Time 4    Period Weeks    Status Partially Met      PT LONG TERM GOAL #2   Title Patient will demonstrate 0-155 degress of right elbow AROM to improve ability to perform functional tasks.    Time 4    Period Weeks    Status On-going      PT LONG TERM GOAL #3   Title Patient will demonstrate 30+ lbs of right grip strength to improve strength for functional tasks.    Time 4    Period Weeks    Status On-going      PT LONG TERM GOAL #4   Title Patient will report ability to perform ADLs and home activities with right elbow pain less than or equal to 3/10.    Time 4    Period Weeks    Status On-going                 Plan - 04/19/20 1031    Clinical Impression Statement Patient arrives to physical therapy with no reports of pain. Patient responded well to therapy session but still with limitations with elbow extension PROM. Patient and PT discussed HEP and provided with new exercise programs to which patient reported understanding. No adverse affects upon removal of modalities.    Personal Factors and Comorbidities Age;Comorbidity 2    Comorbidities right humeral head fracture 07/02/2019, HTN, DM    Examination-Activity Limitations Bathing;Dressing;Hygiene/Grooming;Lift;Carry    Examination-Participation Restrictions Driving    Stability/Clinical Decision Making Stable/Uncomplicated    Clinical Decision Making Low    Rehab Potential Good    PT Frequency 3x / week    PT Duration 4 weeks    PT Treatment/Interventions ADLs/Self Care Home Management;Iontophoresis 53m/ml Dexamethasone;Moist Heat;Electrical Stimulation;Cryotherapy;Therapeutic activities;Therapeutic exercise;Neuromuscular re-education;Manual techniques;Passive range of motion;Patient/family  education    PT Next Visit Plan continue right elbow, shoulder and forearm PROM to improve range of motion per MD referral. Modalities PRN for pain relief.    PT Home Exercise Plan see patient education section    Consulted and Agree with Plan of Care Patient           Patient will benefit from skilled therapeutic intervention in order to improve the following deficits and impairments:  Decreased activity tolerance, Decreased strength, Decreased range of motion, Impaired UE functional use, Pain, Postural dysfunction  Visit Diagnosis: Pain in right elbow  Stiffness of right elbow, not elsewhere classified  Muscle weakness (generalized)     Problem List  Patient Active Problem List   Diagnosis Date Noted  . Anemia due to antineoplastic chemotherapy 11/26/2017  . Peripheral neuropathy due to chemotherapy ([place]) 10/14/2017  . Genetic testing 09/19/2017  . Family history of ovarian cancer   . Family history of pancreatic cancer   . Obesity (BMI 30-39.9) 08/26/2017  . Easy bruising 08/18/2017  . Endometrial cancer, FIGO stage IIIA ([place]) 08/12/2017  . Endometrial cancer ([place]) 08/05/2017  . B12 deficiency 06/26/2017  . Endometrial adenocarcinoma ([place]) 06/25/2017  . Umbilical hernia without obstruction and without gangrene 06/25/2017  . Vitamin D deficiency   . Thrombophlebitis leg 09/18/2013  . Hypertension associated with diabetes ([place]) 12/03/2012  . Diabetes ([place]) 12/03/2012  . Hypothyroidism 12/03/2012  . Hyperlipidemia associated with type 2 diabetes mellitus ([place]) 12/03/2012    Torrie Mayers [place] 04/19/2020, 12:27 PM  [facility] [address] [place], [place], [address] Phone: [phone]   Fax:  [phone]  Name: Alexandria Irwin MRN: [id number] Date of Birth: 1946-03-23

## 2020-04-26 ENCOUNTER — Encounter: Payer: Self-pay | Admitting: Physical Therapy

## 2020-04-26 ENCOUNTER — Ambulatory Visit: Payer: Medicare Other | Admitting: Physical Therapy

## 2020-04-26 ENCOUNTER — Other Ambulatory Visit: Payer: Self-pay

## 2020-04-26 DIAGNOSIS — M6281 Muscle weakness (generalized): Secondary | ICD-10-CM | POA: Diagnosis not present

## 2020-04-26 DIAGNOSIS — M25521 Pain in right elbow: Secondary | ICD-10-CM | POA: Diagnosis not present

## 2020-04-26 DIAGNOSIS — M25621 Stiffness of right elbow, not elsewhere classified: Secondary | ICD-10-CM

## 2020-04-26 NOTE — Therapy (Signed)
[facility] [place], [place], [address] Phone: [phone]   Fax:  [phone]  Physical Therapy Treatment PHYSICAL THERAPY DISCHARGE SUMMARY  Visits from Start of Care: 5  Current functional level related to goals / functional outcomes: See below   Remaining deficits: See goals   Education / Equipment: HEP Plan: Patient agrees to discharge.  Patient goals were partially met. Patient is being discharged due to the patient's request.  ?????    Gabriela Eves, PT, DPT 04/26/20     Patient Details  Name: Alexandria Irwin MRN: [id number] Date of Birth: Apr 06, 1946 Referring Provider (PT): Esmond Plants, MD   Encounter Date: 04/26/2020   PT End of Session - 04/26/20 1037    Visit Number 5    Number of Visits 5    Date for PT Re-Evaluation 05/03/20    Authorization Type Progress note every 10th visit    PT Start Time 1037    PT Stop Time 1115    PT Time Calculation (min) 38 min    Activity Tolerance Patient tolerated treatment well    Behavior During Therapy [facility] for tasks assessed/performed           Past Medical History:  Diagnosis Date  . Cataract   . Endometrial adenocarcinoma ([place])   . Environmental and seasonal allergies   . Fall 07/2019  . Family history of ovarian cancer   . Family history of pancreatic cancer   . Fx anat neck humerus-open 07/2019  . Genetic testing 09/19/2017   Multi-Cancer panel (83 genes) @ [facility] - No pathogenic mutations detected  . GERD (gastroesophageal reflux disease)   . History of DVT of lower extremity    early 2000s--- lower right leg  . Hyperlipidemia   . Hypertension   . Hypothyroidism   . Type 2 diabetes mellitus ([place])    followed by pcp  . Umbilical hernia   . Vitamin B 12 deficiency   . Vitamin D deficiency   . Wears glasses     Past Surgical History:  Procedure Laterality Date  . BREAST BIOPSY Left 2009   benign  . CATARACT EXTRACTION W/ INTRAOCULAR  LENS IMPLANT Left 2018  . CATARACT EXTRACTION W/PHACO Right 03/14/2014   Procedure: CATARACT EXTRACTION PHACO AND INTRAOCULAR LENS PLACEMENT (IOC);  Surgeon: Tonny Branch, MD;  Location: [facility];  Service: Ophthalmology;  Laterality: Right;  CDE: 8.34  . FOOT GANGLION EXCISION Right 2013  . HYSTEROSCOPY WITH D & C N/A 06/13/2017   Procedure: DILATATION AND CURETTAGE /HYSTEROSCOPY;  Surgeon: Alden Hipp, MD;  Location: [place] [facility];  Service: Gynecology;  Laterality: N/A;  . IR FLUORO GUIDE PORT INSERTION RIGHT  08/27/2017  . IR REMOVAL TUN ACCESS W/ PORT W/O FL MOD SED  04/27/2018  . IR US GUIDE VASC ACCESS RIGHT  08/27/2017  . LAPAROSCOPIC CHOLECYSTECTOMY  2005  . ROBOTIC ASSISTED TOTAL HYSTERECTOMY WITH BILATERAL SALPINGO OOPHERECTOMY Bilateral 08/05/2017   Procedure: ROBOTIC ASSISTED TOTAL HYSTERECTOMY WITH BILATERAL SALPINGO OOPHORECTOMY;  Surgeon: Everitt Amber, MD;  [facility]: [facility];  Service: Gynecology;  Laterality: Bilateral;  . SENTINEL NODE BIOPSY Bilateral 08/05/2017   Procedure: SENTINEL NODE BIOPSY;  Surgeon: Everitt Amber, MD;  Location: [facility];  Service: Gynecology;  Laterality: Bilateral;  . UMBILICAL HERNIA REPAIR N/A 08/05/2017   Procedure: HERNIA REPAIR UMBILICAL ADULT;  Surgeon: Everitt Amber, MD;  [facility];  Service: Gynecology;  Laterality: N/A;  . VEIN SURGERY Right 2011   vein stripping-leg  and laser treatment  There were no vitals filed for this visit.   Subjective Assessment - 04/26/20 1036    Subjective COVID 19 screening performed on patient upon arrival. Patient reports no pain unless certain movements.    Pertinent History right elbow fracture with routine healing; right humeral head fracture 07/02/2019, HTN, DM    Limitations Lifting;Writing;House hold activities    Diagnostic tests x-ray: right elbow fracture, routine healing    Patient Stated Goals "get back to normal routine"    Currently in Pain? No/denies              [facility] PT Assessment - 04/26/20 0001       Assessment   Medical Diagnosis Right elbow pain    Referring Provider (PT) Esmond Plants, MD    Onset Date/Surgical Date 07/02/19    Hand Dominance Right    Next MD Visit 04/18/2020    Prior Therapy for shoulder      Precautions   Precautions Other (comment)    Precaution Comments slow progression, PROM then transition to HEP per referral      ROM / Strength   AROM / PROM / Strength AROM;Strength      AROM   Overall AROM  Within functional limits for tasks performed    AROM Assessment Site Elbow    Right/Left Elbow Right    Right Elbow Flexion 150    Right Elbow Extension 25      Strength   Overall Strength Within functional limits for tasks performed    Strength Assessment Site Hand    Right/Left hand Right    Right Hand Grip (lbs) 28                         [facility] Adult PT Treatment/Exercise - 04/26/20 0001      Modalities   Modalities Electrical Stimulation;Moist Heat      Moist Heat Therapy   Number Minutes Moist Heat 10 Minutes    Moist Heat Location Elbow      Electrical Stimulation   Electrical Stimulation Location right distal bicep and proximal forearm    Electrical Stimulation Action Pre-Mod    Electrical Stimulation Parameters 80-150 hz x10 min    Electrical Stimulation Goals Tone      Manual Therapy   Manual Therapy Passive ROM    Soft tissue mobilization STW/M to distal biceps to decrease tone    Passive ROM PROM to R elbow into extension with low loads, PROM into pronation and supination.                       PT Long Term Goals - 04/26/20 1105      PT LONG TERM GOAL #1   Title Patient will be independent HEP    Time 4    Period Weeks    Status Partially Met      PT LONG TERM GOAL #2   Title Patient will demonstrate 0-155 degress of right elbow AROM to improve ability to perform functional tasks.    Time 4    Period Weeks    Status Not Met      PT LONG TERM GOAL #3   Title Patient will demonstrate 30+ lbs  of right grip strength to improve strength for functional tasks.    Time 4    Period Weeks    Status Not Met      PT LONG TERM GOAL #4   Title Patient will  report ability to perform ADLs and home activities with right elbow pain less than or equal to 3/10.    Time 4    Period Weeks    Status Achieved                 Plan - 04/26/20 1105    Clinical Impression Statement Patient presented in clinic with reports of no current R elbow pain. Patient still limited with R elbow extension fully and does experience some pain in elbow extension. More tone palpable in lateral R elbow region and distal bicep. Normal modalities response noted following removal of the modalities.    Personal Factors and Comorbidities Age;Comorbidity 2    Comorbidities right humeral head fracture 07/02/2019, HTN, DM    Examination-Activity Limitations Bathing;Dressing;Hygiene/Grooming;Lift;Carry    Examination-Participation Restrictions Driving    Stability/Clinical Decision Making Stable/Uncomplicated    Rehab Potential Good    PT Frequency 3x / week    PT Duration 4 weeks    PT Treatment/Interventions ADLs/Self Care Home Management;Iontophoresis 35m/ml Dexamethasone;Moist Heat;Electrical Stimulation;Cryotherapy;Therapeutic activities;Therapeutic exercise;Neuromuscular re-education;Manual techniques;Passive range of motion;Patient/family education    PT Next Visit Plan D/C summary required.    PT Home Exercise Plan see patient education section    Consulted and Agree with Plan of Care Patient           Patient will benefit from skilled therapeutic intervention in order to improve the following deficits and impairments:  Decreased activity tolerance, Decreased strength, Decreased range of motion, Impaired UE functional use, Pain, Postural dysfunction  Visit Diagnosis: Pain in right elbow  Stiffness of right elbow, not elsewhere classified  Muscle weakness (generalized)     Problem List Patient  Active Problem List   Diagnosis Date Noted  . Anemia due to antineoplastic chemotherapy 11/26/2017  . Peripheral neuropathy due to chemotherapy ([place] 10/14/2017  . Genetic testing 09/19/2017  . Family history of ovarian cancer   . Family history of pancreatic cancer   . Obesity (BMI 30-39.9) 08/26/2017  . Easy bruising 08/18/2017  . Endometrial cancer, FIGO [place] IIIA ([place] 08/12/2017  . [place]rial cancer ([place] 08/05/2017  . B12 deficiency 06/26/2017  . [place]ometrial adenocarcinoma ([place] 06/25/2017  . Umbilical hernia without obstruction and without gangrene 06/25/2017  . Vitamin D deficiency   . Thrombophlebitis leg 09/18/2013  . Hypertension associated with diabetes ([place] 12/03/2012  . Diabetes ([place] 12/03/2012  . Hypothyroidism 12/03/2012  . Hyperlipidemia associated with type 2 diabetes mellitus ([place] 12/03/2012   KStandley Brooking PTA 04/26/20 11:29 AM   [facility] [address] [address]Phone: [phone]  Fax:  [phone] Name: Alexandria CULLIFERMRN: [id number] of Birth: 105-Jan-1947

## 2020-05-02 ENCOUNTER — Encounter (INDEPENDENT_AMBULATORY_CARE_PROVIDER_SITE_OTHER): Payer: Self-pay

## 2020-05-12 DIAGNOSIS — Z23 Encounter for immunization: Secondary | ICD-10-CM | POA: Diagnosis not present

## 2020-05-16 DIAGNOSIS — H04123 Dry eye syndrome of bilateral lacrimal glands: Secondary | ICD-10-CM | POA: Diagnosis not present

## 2020-05-16 DIAGNOSIS — H40033 Anatomical narrow angle, bilateral: Secondary | ICD-10-CM | POA: Diagnosis not present

## 2020-05-16 LAB — HM DIABETES EYE EXAM

## 2020-06-14 ENCOUNTER — Other Ambulatory Visit: Payer: Self-pay | Admitting: Family

## 2020-06-14 DIAGNOSIS — E1169 Type 2 diabetes mellitus with other specified complication: Secondary | ICD-10-CM

## 2020-06-29 ENCOUNTER — Ambulatory Visit (INDEPENDENT_AMBULATORY_CARE_PROVIDER_SITE_OTHER): Payer: Medicare Other | Admitting: Gastroenterology

## 2020-07-06 ENCOUNTER — Ambulatory Visit (INDEPENDENT_AMBULATORY_CARE_PROVIDER_SITE_OTHER): Payer: Medicare Other | Admitting: Gastroenterology

## 2020-07-06 ENCOUNTER — Other Ambulatory Visit (INDEPENDENT_AMBULATORY_CARE_PROVIDER_SITE_OTHER): Payer: Self-pay

## 2020-07-06 ENCOUNTER — Other Ambulatory Visit: Payer: Self-pay

## 2020-07-06 ENCOUNTER — Telehealth (INDEPENDENT_AMBULATORY_CARE_PROVIDER_SITE_OTHER): Payer: Self-pay

## 2020-07-06 ENCOUNTER — Encounter (INDEPENDENT_AMBULATORY_CARE_PROVIDER_SITE_OTHER): Payer: Self-pay

## 2020-07-06 ENCOUNTER — Encounter (INDEPENDENT_AMBULATORY_CARE_PROVIDER_SITE_OTHER): Payer: Self-pay | Admitting: Gastroenterology

## 2020-07-06 DIAGNOSIS — R932 Abnormal findings on diagnostic imaging of liver and biliary tract: Secondary | ICD-10-CM | POA: Diagnosis not present

## 2020-07-06 DIAGNOSIS — K760 Fatty (change of) liver, not elsewhere classified: Secondary | ICD-10-CM

## 2020-07-06 MED ORDER — PLENVU 140 G PO SOLR: 280.0000 g | Freq: Once | ORAL | 0 refills | Status: AC

## 2020-07-06 NOTE — Patient Instructions (Signed)
Perform blood workup Schedule liver elastography Schedule colonoscopy  

## 2020-07-06 NOTE — Telephone Encounter (Signed)
Alexandria Irwin, CMA  

## 2020-07-06 NOTE — Progress Notes (Signed)
Alexandria Irwin, M.D. Gastroenterology & Hepatology [facility] [address] [place], [place] [address]  Primary Care Physician: Junie Spencer, FNP [address] [place] [address]  I will communicate my assessment and recommendations to the referring MD via EMR.  Problems: 1. NASH 2. Liver cirrhosis?  History of Present Illness: Alexandria Irwin is a 75 y.o. 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  The patient was last seen on 12/29/2019. At that time, the patient was ordered surveillance labs for her liver cirrhosis as well as abdominal imaging.  Most recent available labs are from 04/11/2020 which showed a CMP with an albumin of 4.5, renal function with creatinine 1.15, BUN 13, normal electrolytes, ALT 21, AST 22, total bilirubin 0.7, most recent CBC from 01/07/2020 showed white blood cell count 5.9, hemoglobin 13.2, platelets 208, other labs from 12/29/2019 INR 1.0, AFP 2.1.  Most recent abdominal ultrasound from 01/05/2020 showed status post cholecystectomy, presence of increased liver echogenicity in the liver suggestive of cirrhosis, no other ulceration such as ascites or masses were found.  The patient denies having any complaints and feels well. Is feeling sad as her husband passed away a month ago, but has a strong family support. The patient denies having any nausea, vomiting, fever, chills, hematochezia, melena, hematemesis, abdominal distention, abdominal pain, diarrhea, jaundice, pruritus or weight loss.  Last EGD: never Last Colonoscopy: never  Past Medical History: Past Medical History:  Diagnosis Date  . Cataract   . Endometrial adenocarcinoma (HCC)   . Environmental and seasonal allergies   . Fall 07/2019  . Family history of ovarian cancer   . Family history of pancreatic cancer   . Fx  anat neck humerus-open 07/2019  . Genetic testing 09/19/2017   Multi-Cancer panel (83 genes) @ [facility] - No pathogenic mutations detected  . GERD (gastroesophageal reflux disease)   . History of DVT of lower extremity    early 2000s--- lower right leg  . Hyperlipidemia   . Hypertension   . Hypothyroidism   . Type 2 diabetes mellitus (HCC)    followed by pcp  . Umbilical hernia   . Vitamin B 12 deficiency   . Vitamin D deficiency   . Wears glasses     Past Surgical History: Past Surgical History:  Procedure Laterality Date  . BREAST BIOPSY Left 2009   benign  . CATARACT EXTRACTION W/ INTRAOCULAR LENS IMPLANT Left 2018  . CATARACT EXTRACTION W/PHACO Right 03/14/2014   Procedure: CATARACT EXTRACTION PHACO AND INTRAOCULAR LENS PLACEMENT (IOC);  Surgeon: Gemma Payor, MD;  Location: [facility];  Service: Ophthalmology;  Laterality: Right;  CDE: 8.34  . FOOT GANGLION EXCISION Right 2013  . HYSTEROSCOPY WITH D & C N/A 06/13/2017   Procedure: DILATATION AND CURETTAGE /HYSTEROSCOPY;  Surgeon: Ilda Mori, MD;  Location: [facility];  Service: Gynecology;  Laterality: N/A;  . IR FLUORO GUIDE PORT INSERTION RIGHT  08/27/2017  . IR REMOVAL TUN ACCESS W/ PORT W/O FL MOD SED  04/27/2018  . IR US GUIDE VASC ACCESS RIGHT  08/27/2017  . LAPAROSCOPIC CHOLECYSTECTOMY  2005  . ROBOTIC ASSISTED TOTAL HYSTERECTOMY WITH BILATERAL SALPINGO OOPHERECTOMY Bilateral 08/05/2017   Procedure: ROBOTIC ASSISTED TOTAL HYSTERECTOMY WITH BILATERAL SALPINGO OOPHORECTOMY;  Surgeon: Adolphus Birchwood, MD;  Location: [facility];  Service: Gynecology;  Laterality: Bilateral;  . SENTINEL NODE BIOPSY Bilateral 08/05/2017  Procedure: SENTINEL NODE BIOPSY;  Surgeon: Everitt Amber, MD;  Location: [facility];  Service: Gynecology;  Laterality: Bilateral;  . UMBILICAL HERNIA REPAIR N/A 08/05/2017   Procedure: HERNIA REPAIR UMBILICAL ADULT;  Surgeon: Everitt Amber, MD;  Location: [facility];  Service: Gynecology;  Laterality: N/A;  . VEIN SURGERY Right 2011    vein stripping-leg  and laser treatment    Family History: Family History  Problem Relation Age of Onset  . Pancreatic cancer Mother 77       deceased 74  . Ovarian cancer Mother        dx 85s  . Cancer Other 54       maternal half-sister; unk. type    Social History: Social History   Tobacco Use  Smoking Status Never Smoker  Smokeless Tobacco Never Used   Social History   Substance and Sexual Activity  Alcohol Use No   Social History   Substance and Sexual Activity  Drug Use No    Allergies: Allergies  Allergen Reactions  . Ace Inhibitors Cough  . Ciprocin-Fluocin-Procin [Fluocinolone] Hives  . Norvasc [Amlodipine] Cough  . Latex Rash  . Neosporin [Neomycin-Bacitracin Zn-Polymyx] Rash  . Sulfa Antibiotics Rash    Medications: Current Outpatient Medications  Medication Sig Dispense Refill  . acetaminophen (TYLENOL) 500 MG tablet Take 1,000 mg by mouth every 8 (eight) hours as needed for mild pain or headache.    . Ascorbic Acid (VITAMIN C) 100 MG CHEW Vitamin C    . aspirin EC 81 MG tablet Take 81 mg by mouth daily.    . Calcium Citrate-Vitamin D ([employer] CALCIUM CITRATE +D PO) Take 2 tablets by mouth every evening.     . cycloSPORINE (RESTASIS) 0.05 % ophthalmic emulsion Place 1 drop into both eyes 2 (two) times daily.     . dapagliflozin propanediol (FARXIGA) 10 MG TABS tablet Take 1 tablet (10 mg total) by mouth daily before breakfast. (Patient taking differently: Take 5 mg by mouth daily before breakfast. ) 30 tablet 0  . furosemide (LASIX) 20 MG tablet Take 0.5 tablets (10 mg total) by mouth daily. 45 tablet 1  . glimepiride (AMARYL) 4 MG tablet TAKE 1 TABLET BY MOUTH DAILY WITH BREAKFAST 90 tablet 0  . glucose blood (ONETOUCH VERIO) test strip Test BS daily Dx E11.9 100 each 3  . Lancets (ONETOUCH DELICA PLUS [id number]) MISC 1 each by Other route daily. Dx E11.9 100 each 3  . levothyroxine (SYNTHROID) 88 MCG tablet Take 1 tablet (88 mcg total) by mouth daily.  90 tablet 3  . losartan (COZAAR) 100 MG tablet Take 1 tablet (100 mg total) by mouth daily. 90 tablet 2  . Magnesium 250 MG TABS Take 250 mg by mouth every evening.     . meclizine (ANTIVERT) 25 MG tablet TAKE 1 TABLET (25 MG TOTAL) BY MOUTH 2 (TWO) TIMES DAILY AS NEEDED FOR DIZZINESS. 60 tablet 3  . metFORMIN (GLUCOPHAGE-XR) 500 MG 24 hr tablet TAKE 2 TABLETS BY MOUTH EVERY DAY WITH BREAKFAST 180 tablet 0  . metoprolol succinate (TOPROL-XL) 50 MG 24 hr tablet Take 1 tablet (50 mg total) by mouth 2 (two) times daily. Take with or immediately following a meal. 180 tablet 2  . Omega-3 Fatty Acids (FISH OIL) 1000 MG CAPS Take 1,000 mg by mouth every evening.    . simvastatin (ZOCOR) 40 MG tablet TAKE 1 TABLET BY MOUTH ONCE DAILY AT 6PM 90 tablet 1  . Vitamin D, Ergocalciferol, (DRISDOL) 1.25 MG (50000  UNIT) CAPS capsule Take 1 capsule (50,000 Units total) by mouth every 7 (seven) days. 12 capsule 3   No current facility-administered medications for this visit.    Review of Systems: GENERAL: negative for malaise, night sweats HEENT: No changes in hearing or vision, no nose bleeds or other nasal problems. NECK: Negative for lumps, goiter, pain and significant neck swelling RESPIRATORY: Negative for cough, wheezing CARDIOVASCULAR: Negative for chest pain, leg swelling, palpitations, orthopnea GI: SEE HPI MUSCULOSKELETAL: Negative for joint pain or swelling, back pain, and muscle pain. SKIN: Negative for lesions, rash PSYCH: Negative for sleep disturbance, mood disorder and recent psychosocial stressors. HEMATOLOGY Negative for prolonged bleeding, bruising easily, and swollen nodes. ENDOCRINE: Negative for cold or heat intolerance, polyuria, polydipsia and goiter. NEURO: negative for tremor, gait imbalance, syncope and seizures. The remainder of the review of systems is noncontributory.   Physical Exam: BP (!) 156/84 (BP Location: Left Arm, Patient Position: Sitting, Cuff Size: Large)   Pulse  61   Temp 98 F (36.7 C) (Oral)   Ht 5\' 1"  (1.549 m)   Wt 161 lb (73 kg)   BMI 30.42 kg/m  GENERAL: The patient is AO x3, in no acute distress. Obese. HEENT: Head is normocephalic and atraumatic. EOMI are intact. Mouth is well hydrated and without lesions. NECK: Supple. No masses LUNGS: Clear to auscultation. No presence of rhonchi/wheezing/rales. Adequate chest expansion HEART: RRR, normal s1 and s2. ABDOMEN: Soft, nontender, no guarding, no peritoneal signs, and nondistended. BS +. No masses. EXTREMITIES: Without any cyanosis, clubbing, rash, lesions or edema. NEUROLOGIC: AOx3, no focal motor deficit. SKIN: no jaundice, no rashes  Imaging/Labs: as above  I personally reviewed and interpreted the available labs, imaging and endoscopic files.  Impression and Plan: GENNI CARLOCK is a 75 y.o. 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  The patient denies having any complaints and states feeling well.  In terms of her liver disease, she had previous investigations that were negative for autoimmune, metabolic or viral etiologies for cirrhosis.  It was felt that she had multiple factors for NASH.  Her liver imaging was suggestive of cirrhosis but she does not have any physical findings or biochemical alterations consistent with cirrhosis.  It is possible that she has very early cirrhosis but it would be important to obtain a liver elastography at this moment to confirm her diagnosis.  If she indeed has cirrhosis, she has a very low MELD of 6 and no manifestations of portal hypertension, consistent with compensated cirrhosis CPT A.  I will order MELD labs today.  The patient is due for a screening colonoscopy which will be ordered today as well.  -Perform MELD labs and AFP -Schedule liver elastography -Schedule colonoscopy  - RTC 6 months  All questions were answered.       Alexandria Quale, MD Gastroenterology and Hepatology [facility]

## 2020-07-07 LAB — CBC WITH DIFFERENTIAL/PLATELET
Absolute Monocytes: 469 cells/uL (ref 200–950)
Basophils Absolute: 27 cells/uL (ref 0–200)
Basophils Relative: 0.4 %
Eosinophils Absolute: 88 cells/uL (ref 15–500)
Eosinophils Relative: 1.3 %
HCT: 42.6 % (ref 35.0–45.0)
Hemoglobin: 14.3 g/dL (ref 11.7–15.5)
Lymphs Abs: 2890 cells/uL (ref 850–3900)
MCH: 32.1 pg (ref 27.0–33.0)
MCHC: 33.6 g/dL (ref 32.0–36.0)
MCV: 95.7 fL (ref 80.0–100.0)
MPV: 10.5 fL (ref 7.5–12.5)
Monocytes Relative: 6.9 %
Neutro Abs: 3325 cells/uL (ref 1500–7800)
Neutrophils Relative %: 48.9 %
Platelets: 184 10*3/uL (ref 140–400)
RBC: 4.45 10*6/uL (ref 3.80–5.10)
RDW: 13.2 % (ref 11.0–15.0)
Total Lymphocyte: 42.5 %
WBC: 6.8 10*3/uL (ref 3.8–10.8)

## 2020-07-07 LAB — COMPREHENSIVE METABOLIC PANEL
AG Ratio: 1.6 (calc) (ref 1.0–2.5)
ALT: 16 U/L (ref 6–29)
AST: 19 U/L (ref 10–35)
Albumin: 4.4 g/dL (ref 3.6–5.1)
Alkaline phosphatase (APISO): 37 U/L (ref 37–153)
BUN/Creatinine Ratio: 14 (calc) (ref 6–22)
BUN: 16 mg/dL (ref 7–25)
CO2: 31 mmol/L (ref 20–32)
Calcium: 10.3 mg/dL (ref 8.6–10.4)
Chloride: 103 mmol/L (ref 98–110)
Creat: 1.13 mg/dL — ABNORMAL HIGH (ref 0.60–0.93)
Globulin: 2.8 g/dL (calc) (ref 1.9–3.7)
Glucose, Bld: 100 mg/dL (ref 65–139)
Potassium: 5 mmol/L (ref 3.5–5.3)
Sodium: 142 mmol/L (ref 135–146)
Total Bilirubin: 0.9 mg/dL (ref 0.2–1.2)
Total Protein: 7.2 g/dL (ref 6.1–8.1)

## 2020-07-07 LAB — PROTIME-INR
INR: 1
Prothrombin Time: 10.7 s (ref 9.0–11.5)

## 2020-07-07 LAB — AFP TUMOR MARKER: AFP-Tumor Marker: 2 ng/mL

## 2020-07-14 ENCOUNTER — Other Ambulatory Visit: Payer: Self-pay

## 2020-07-14 ENCOUNTER — Ambulatory Visit (HOSPITAL_COMMUNITY)
Admission: RE | Admit: 2020-07-14 | Discharge: 2020-07-14 | Disposition: A | Discharge status: Home / Self Care | Payer: Medicare Other | Source: Ambulatory Visit | Attending: Gastroenterology | Admitting: Gastroenterology

## 2020-07-14 DIAGNOSIS — R932 Abnormal findings on diagnostic imaging of liver and biliary tract: Secondary | ICD-10-CM | POA: Insufficient documentation

## 2020-07-14 DIAGNOSIS — K760 Fatty (change of) liver, not elsewhere classified: Secondary | ICD-10-CM | POA: Diagnosis not present

## 2020-07-16 ENCOUNTER — Other Ambulatory Visit: Payer: Self-pay | Admitting: Family

## 2020-07-16 DIAGNOSIS — R42 Dizziness and giddiness: Secondary | ICD-10-CM

## 2020-07-18 ENCOUNTER — Other Ambulatory Visit: Payer: Self-pay | Admitting: Family

## 2020-07-18 DIAGNOSIS — R42 Dizziness and giddiness: Secondary | ICD-10-CM

## 2020-07-31 ENCOUNTER — Other Ambulatory Visit: Payer: Self-pay | Admitting: Family

## 2020-08-07 NOTE — Patient Instructions (Signed)
Your procedure is scheduled on: 08/11/2020  Report to [facility] at   6:15  AM.  Call this number if you have problems the morning of surgery: [phone]   Remember:              Follow Directions on the letter you received from Your Physician's office regarding the Bowel Prep              No Smoking the day of Procedure :   Take these medicines the morning of surgery with A SIP OF WATER: Farxiga, levothyroxine and metoprolol   Do not wear jewelry, make-up or nail polish.    Do not bring valuables to the hospital.  Contacts, dentures or bridgework may not be worn into surgery.  .   Patients discharged the day of surgery will not be allowed to drive home.     Colonoscopy, Adult, Care After This sheet gives you information about how to care for yourself after your procedure. Your health care provider may also give you more specific instructions. If you have problems or questions, contact your health care provider. What can I expect after the procedure? After the procedure, it is common to have:  A small amount of blood in your stool for 24 hours after the procedure.  Some gas.  Mild abdominal cramping or bloating.  Follow these instructions at home: General instructions   For the first 24 hours after the procedure: ? Do not drive or use machinery. ? Do not sign important documents. ? Do not drink alcohol. ? Do your regular daily activities at a slower pace than normal. ? Eat soft, easy-to-digest foods. ? Rest often.  Take over-the-counter or prescription medicines only as told by your health care provider.  It is up to you to get the results of your procedure. Ask your health care provider, or the department performing the procedure, when your results will be ready. Relieving cramping and bloating  Try walking around when you have cramps or feel bloated.  Apply heat to your abdomen as told by your health care provider. Use a heat source that your health care  provider recommends, such as a moist heat pack or a heating pad. ? Place a towel between your skin and the heat source. ? Leave the heat on for 20-30 minutes. ? Remove the heat if your skin turns bright red. This is especially important if you are unable to feel pain, heat, or cold. You may have a greater risk of getting burned. Eating and drinking  Drink enough fluid to keep your urine clear or pale yellow.  Resume your normal diet as instructed by your health care provider. Avoid heavy or fried foods that are hard to digest.  Avoid drinking alcohol for as long as instructed by your health care provider. Contact a health care provider if:  You have blood in your stool 2-3 days after the procedure. Get help right away if:  You have more than a small spotting of blood in your stool.  You pass large blood clots in your stool.  Your abdomen is swollen.  You have nausea or vomiting.  You have a fever.  You have increasing abdominal pain that is not relieved with medicine. This information is not intended to replace advice given to you by your health care provider. Make sure you discuss any questions you have with your health care provider. Document Released: 01/30/2004 Document Revised: 03/11/2016 Document Reviewed: 08/29/2015 Elsevier Interactive Patient Education  2018  [employer].

## 2020-08-09 ENCOUNTER — Encounter (HOSPITAL_COMMUNITY)
Admission: RE | Admit: 2020-08-09 | Discharge: 2020-08-09 | Disposition: A | Discharge status: Home / Self Care | Payer: Medicare Other | Source: Ambulatory Visit | Attending: Gastroenterology | Admitting: Gastroenterology

## 2020-08-09 ENCOUNTER — Other Ambulatory Visit (HOSPITAL_COMMUNITY)
Admission: RE | Admit: 2020-08-09 | Discharge: 2020-08-09 | Disposition: A | Discharge status: Home / Self Care | Payer: Medicare Other | Source: Ambulatory Visit | Attending: Gastroenterology | Admitting: Gastroenterology

## 2020-08-09 ENCOUNTER — Encounter (HOSPITAL_COMMUNITY): Payer: Self-pay

## 2020-08-09 ENCOUNTER — Other Ambulatory Visit: Payer: Self-pay

## 2020-08-09 DIAGNOSIS — Z20822 Contact with and (suspected) exposure to covid-19: Secondary | ICD-10-CM | POA: Diagnosis not present

## 2020-08-09 DIAGNOSIS — I10 Essential (primary) hypertension: Secondary | ICD-10-CM | POA: Insufficient documentation

## 2020-08-09 DIAGNOSIS — Z01818 Encounter for other preprocedural examination: Secondary | ICD-10-CM | POA: Insufficient documentation

## 2020-08-09 LAB — SARS CORONAVIRUS 2 (TAT 6-24 HRS): SARS Coronavirus 2: NEGATIVE

## 2020-08-11 ENCOUNTER — Encounter (HOSPITAL_COMMUNITY)
Admission: RE | Disposition: A | Discharge status: Home / Self Care | Payer: Self-pay | Source: Ambulatory Visit | Attending: Gastroenterology

## 2020-08-11 ENCOUNTER — Ambulatory Visit (HOSPITAL_COMMUNITY): Payer: Medicare Other | Admitting: Certified Registered"

## 2020-08-11 ENCOUNTER — Ambulatory Visit (HOSPITAL_COMMUNITY)
Admission: RE | Admit: 2020-08-11 | Discharge: 2020-08-11 | Disposition: A | Discharge status: Home / Self Care | Payer: Medicare Other | Source: Ambulatory Visit | Attending: Gastroenterology | Admitting: Gastroenterology

## 2020-08-11 ENCOUNTER — Encounter (HOSPITAL_COMMUNITY): Payer: Self-pay | Admitting: Gastroenterology

## 2020-08-11 DIAGNOSIS — Z809 Family history of malignant neoplasm, unspecified: Secondary | ICD-10-CM | POA: Diagnosis not present

## 2020-08-11 DIAGNOSIS — K5730 Diverticulosis of large intestine without perforation or abscess without bleeding: Secondary | ICD-10-CM | POA: Diagnosis not present

## 2020-08-11 DIAGNOSIS — Z91040 Latex allergy status: Secondary | ICD-10-CM | POA: Diagnosis not present

## 2020-08-11 DIAGNOSIS — Z86718 Personal history of other venous thrombosis and embolism: Secondary | ICD-10-CM | POA: Diagnosis not present

## 2020-08-11 DIAGNOSIS — Z881 Allergy status to other antibiotic agents status: Secondary | ICD-10-CM | POA: Diagnosis not present

## 2020-08-11 DIAGNOSIS — E119 Type 2 diabetes mellitus without complications: Secondary | ICD-10-CM | POA: Insufficient documentation

## 2020-08-11 DIAGNOSIS — Z1211 Encounter for screening for malignant neoplasm of colon: Secondary | ICD-10-CM

## 2020-08-11 DIAGNOSIS — K635 Polyp of colon: Secondary | ICD-10-CM | POA: Diagnosis not present

## 2020-08-11 DIAGNOSIS — Z7982 Long term (current) use of aspirin: Secondary | ICD-10-CM | POA: Diagnosis not present

## 2020-08-11 DIAGNOSIS — D122 Benign neoplasm of ascending colon: Secondary | ICD-10-CM

## 2020-08-11 DIAGNOSIS — Z8042 Family history of malignant neoplasm of prostate: Secondary | ICD-10-CM | POA: Insufficient documentation

## 2020-08-11 DIAGNOSIS — Z79899 Other long term (current) drug therapy: Secondary | ICD-10-CM | POA: Diagnosis not present

## 2020-08-11 DIAGNOSIS — Z888 Allergy status to other drugs, medicaments and biological substances status: Secondary | ICD-10-CM | POA: Diagnosis not present

## 2020-08-11 DIAGNOSIS — Z7984 Long term (current) use of oral hypoglycemic drugs: Secondary | ICD-10-CM | POA: Diagnosis not present

## 2020-08-11 DIAGNOSIS — K648 Other hemorrhoids: Secondary | ICD-10-CM | POA: Diagnosis not present

## 2020-08-11 DIAGNOSIS — Z8041 Family history of malignant neoplasm of ovary: Secondary | ICD-10-CM | POA: Diagnosis not present

## 2020-08-11 DIAGNOSIS — D125 Benign neoplasm of sigmoid colon: Secondary | ICD-10-CM

## 2020-08-11 DIAGNOSIS — D120 Benign neoplasm of cecum: Secondary | ICD-10-CM | POA: Diagnosis not present

## 2020-08-11 DIAGNOSIS — E039 Hypothyroidism, unspecified: Secondary | ICD-10-CM | POA: Diagnosis not present

## 2020-08-11 DIAGNOSIS — Z882 Allergy status to sulfonamides status: Secondary | ICD-10-CM | POA: Diagnosis not present

## 2020-08-11 HISTORY — PX: POLYPECTOMY: SHX149

## 2020-08-11 HISTORY — PX: COLONOSCOPY WITH PROPOFOL: SHX5780

## 2020-08-11 LAB — GLUCOSE, CAPILLARY
Glucose-Capillary: 123 mg/dL — ABNORMAL HIGH (ref 70–99)
Glucose-Capillary: 101 mg/dL — ABNORMAL HIGH (ref 70–99)

## 2020-08-11 SURGERY — COLONOSCOPY WITH PROPOFOL
Anesthesia: General

## 2020-08-11 MED ORDER — PROPOFOL 500 MG/50ML IV EMUL
INTRAVENOUS | Status: DC | PRN
  Administered 2020-08-11: 150 ug/kg/min via INTRAVENOUS

## 2020-08-11 MED ORDER — PHENYLEPHRINE 40 MCG/ML (10ML) SYRINGE FOR IV PUSH (FOR BLOOD PRESSURE SUPPORT)
PREFILLED_SYRINGE | INTRAVENOUS | Status: DC | PRN
  Administered 2020-08-11: 120 ug via INTRAVENOUS
  Administered 2020-08-11: 80 ug via INTRAVENOUS
  Administered 2020-08-11: 120 ug via INTRAVENOUS

## 2020-08-11 MED ORDER — LIDOCAINE HCL (CARDIAC) PF 100 MG/5ML IV SOSY
PREFILLED_SYRINGE | INTRAVENOUS | Status: DC | PRN
  Administered 2020-08-11: 50 mg via INTRAVENOUS

## 2020-08-11 MED ORDER — PROPOFOL 10 MG/ML IV BOLUS
INTRAVENOUS | Status: DC | PRN
  Administered 2020-08-11: 100 mg via INTRAVENOUS
  Administered 2020-08-11: 40 mg via INTRAVENOUS

## 2020-08-11 MED ORDER — LACTATED RINGERS IV SOLN
INTRAVENOUS | Status: DC
  Administered 2020-08-11: 1000 mL via INTRAVENOUS

## 2020-08-11 MED ORDER — EPHEDRINE SULFATE-NACL 50-0.9 MG/10ML-% IV SOSY
PREFILLED_SYRINGE | INTRAVENOUS | Status: DC | PRN
  Administered 2020-08-11: 5 mg via INTRAVENOUS

## 2020-08-11 MED ORDER — EPHEDRINE 5 MG/ML INJ
INTRAVENOUS | Status: AC
  Filled 2020-08-11: qty 10

## 2020-08-11 MED ORDER — PHENYLEPHRINE 40 MCG/ML (10ML) SYRINGE FOR IV PUSH (FOR BLOOD PRESSURE SUPPORT)
PREFILLED_SYRINGE | INTRAVENOUS | Status: AC
  Filled 2020-08-11: qty 10

## 2020-08-11 MED ORDER — STERILE WATER FOR IRRIGATION IR SOLN
Status: DC | PRN
  Administered 2020-08-11: 100 mL

## 2020-08-11 NOTE — Op Note (Addendum)
[facility] Patient Name: Alexandria Irwin Procedure Date: 08/11/2020 7:11 AM MRN: [id number] Date of Birth: [birth year] Attending MD: Maylon Peppers ,  CSN: [id number] Age: 75 Admit Type: Outpatient Procedure:                Colonoscopy Indications:              Screening for colorectal malignant neoplasm Providers:                Maylon Peppers, Crystal Page, Randa Spike,                            Technician Referring MD:              Medicines:                Monitored Anesthesia Care Complications:            No immediate complications. Estimated Blood Loss:     Estimated blood loss: none. Procedure:                Pre-Anesthesia Assessment:                           - Prior to the procedure, a History and Physical                            was performed, and patient medications, allergies                            and sensitivities were reviewed. The patient's                            tolerance of previous anesthesia was reviewed.                           - The risks and benefits of the procedure and the                            sedation options and risks were discussed with the                            patient. All questions were answered and informed                            consent was obtained.                           - ASA Grade Assessment: III - A patient with severe                            systemic disease.                           After obtaining informed consent, the colonoscope                            was passed under direct vision. Throughout the  procedure, the patient's blood pressure, pulse, and                            oxygen saturations were monitored continuously. The                            [id number]-[id number] ([id number]) scope was introduced through                            the anus and advanced to the the cecum, identified                            by appendiceal orifice and ileocecal valve. The                             colonoscopy was performed without difficulty. The                            patient tolerated the procedure well. The quality                            of the bowel preparation was excellent. Scope                            withdrawal time was 15 minutes. Scope In: 7:38:19 AM Scope Out: 8:07:52 AM Scope Withdrawal Time: 0 hours 20 minutes 42 seconds  Total Procedure Duration: 0 hours 29 minutes 33 seconds  Findings:      The perianal and digital rectal examinations were normal.      Three sessile polyps were found in the ascending colon and cecum. The       polyps were 3 to 5 mm in size. These polyps were removed with a cold       snare. Resection and retrieval were complete.      A 5 mm polyp was found in the sigmoid colon. The polyp was sessile. The       polyp was removed with a cold snare. Resection and retrieval were       complete.      Multiple small and large-mouthed diverticula were found in the sigmoid       colon, descending colon and ascending colon.      Non-bleeding internal hemorrhoids were found during retroflexion. The       hemorrhoids were small. Impression:               - Three 3 to 5 mm polyps in the ascending colon and                            in the cecum, removed with a cold snare. Resected                            and retrieved.                           - One 5 mm polyp in the sigmoid colon, removed with  a cold snare. Resected and retrieved.                           - Diverticulosis in the sigmoid colon, in the                            descending colon and in the ascending colon.                           - Non-bleeding internal hemorrhoids. Moderate Sedation:      Per Anesthesia Care Recommendation:           - Discharge patient to home (ambulatory).                           - Resume previous diet.                           - Await pathology results.                           - Repeat colonoscopy is not  recommended due to                            current age (57 years or older) for screening                            purposes. Procedure Code(s):        --- Professional ---                           [phone], Colonoscopy, flexible; with removal of                            tumor(s), polyp(s), or other lesion(s) by snare                            technique Diagnosis Code(s):        --- Professional ---                           K63.5, Polyp of colon                           Z12.11, Encounter for screening for malignant                            neoplasm of colon                           K64.8, Other hemorrhoids                           K57.30, Diverticulosis of large intestine without                            perforation or abscess without bleeding CPT copyright 2019 American Medical Association. All rights  reserved. The codes documented in this report are preliminary and upon coder review may  be revised to meet current compliance requirements. Maylon Peppers, MD Maylon Peppers,  08/11/2020 8:14:45 AM This report has been signed electronically. Number of Addenda: 0

## 2020-08-11 NOTE — Anesthesia Procedure Notes (Signed)
Date/Time: 08/11/2020 7:42 AM Performed by: Orlie Dakin, CRNA Pre-anesthesia Checklist: Patient identified, Emergency Drugs available, Suction available and Patient being monitored Patient Re-evaluated:Patient Re-evaluated prior to induction Oxygen Delivery Method: Nasal cannula Induction Type: IV induction Placement Confirmation: positive ETCO2

## 2020-08-11 NOTE — Transfer of Care (Signed)
Immediate Anesthesia Transfer of Care Note  Patient: Alexandria Irwin  Procedure(s) Performed: COLONOSCOPY WITH PROPOFOL (N/A ) POLYPECTOMY INTESTINAL  Patient Location: Short Stay  Anesthesia Type:General  Level of Consciousness: awake, alert  and oriented  Airway & Oxygen Therapy: Patient Spontanous Breathing  Post-op Assessment: Report given to RN, Post -op Vital signs reviewed and stable and Patient moving all extremities X 4  Post vital signs: Reviewed and stable  Last Vitals:  Vitals Value Taken Time  BP 111/64 08/11/20 0812  Temp 36.5 C 08/11/20 0812  Pulse 66 08/11/20 0812  Resp 18 08/11/20 0812  SpO2 100 % 08/11/20 0812    Last Pain:  Vitals:   08/11/20 0812  TempSrc: Oral  PainSc: 0-No pain      Patients Stated Pain Goal: 8 (15/40/08 6761)  Complications: No complications documented.

## 2020-08-11 NOTE — Anesthesia Preprocedure Evaluation (Signed)
Anesthesia Evaluation  Patient identified by MRN, date of birth, ID band Patient awake    Reviewed: Allergy & Precautions, H&P , NPO status , Patient's Chart, lab work & pertinent test results, reviewed documented beta blocker date and time   Airway Mallampati: II  TM Distance: >3 FB Neck ROM: full    Dental no notable dental hx.    Pulmonary neg pulmonary ROS,    Pulmonary exam normal breath sounds clear to auscultation       Cardiovascular Exercise Tolerance: Good hypertension, negative cardio ROS   Rhythm:regular Rate:Normal     Neuro/Psych  Neuromuscular disease negative psych ROS   GI/Hepatic Neg liver ROS, GERD  Medicated,  Endo/Other  diabetesHypothyroidism   Renal/GU negative Renal ROS  negative genitourinary   Musculoskeletal   Abdominal   Peds  Hematology  (+) Blood dyscrasia, anemia ,   Anesthesia Other Findings   Reproductive/Obstetrics negative OB ROS                             Anesthesia Physical Anesthesia Plan  ASA: II  Anesthesia Plan: General   Post-op Pain Management:    Induction:   PONV Risk Score and Plan: Propofol infusion  Airway Management Planned:   Additional Equipment:   Intra-op Plan:   Post-operative Plan:   Informed Consent: I have reviewed the patients History and Physical, chart, labs and discussed the procedure including the risks, benefits and alternatives for the proposed anesthesia with the patient or authorized representative who has indicated his/her understanding and acceptance.     Dental Advisory Given  Plan Discussed with: CRNA  Anesthesia Plan Comments:         Anesthesia Quick Evaluation

## 2020-08-11 NOTE — Anesthesia Postprocedure Evaluation (Signed)
Anesthesia Post Note  Patient: CORTASIA SCREWS  Procedure(s) Performed: COLONOSCOPY WITH PROPOFOL (N/A ) POLYPECTOMY INTESTINAL  Patient location during evaluation: Phase II Anesthesia Type: General Level of consciousness: awake and alert and oriented Pain management: pain level controlled Vital Signs Assessment: post-procedure vital signs reviewed and stable Respiratory status: spontaneous breathing, nonlabored ventilation and respiratory function stable Cardiovascular status: stable and blood pressure returned to baseline Postop Assessment: no apparent nausea or vomiting Anesthetic complications: no   No complications documented.   Last Vitals:  Vitals:   08/11/20 0812 08/11/20 0817  BP: 111/64 116/70  Pulse: 66 65  Resp: 18   Temp: 36.5 C   SpO2: 100% 100%    Last Pain:  Vitals:   08/11/20 0812  TempSrc: Oral  PainSc: 0-No pain                 Orlie Dakin

## 2020-08-11 NOTE — H&P (Signed)
Alexandria Irwin is an 75 y.o. female.   Chief Complaint: screening colonoscopy HPI: Alexandria Irwin is a 75 y.o. 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 The patient has never had a colonoscopy in the past.  The patient denies having any complaints such as melena, hematochezia, abdominal pain or distention, change in her bowel movement consistency or frequency, no changes in her weight recently.  No family history of colorectal cancer.  Past Medical History:  Diagnosis Date  . Cataract   . Endometrial adenocarcinoma ([place])   . Environmental and seasonal allergies   . Fall 07/2019  . Family history of ovarian cancer   . Family history of pancreatic cancer   . Fx anat neck humerus-open 07/2019  . Genetic testing 09/19/2017   Multi-Cancer panel (83 genes) @ [facility] - No pathogenic mutations detected  . GERD (gastroesophageal reflux disease)   . History of DVT of lower extremity    early 2000s--- lower right leg  . Hyperlipidemia   . Hypertension   . Hypothyroidism   . Type 2 diabetes mellitus ([place])    followed by pcp  . Umbilical hernia   . Vitamin B 12 deficiency   . Vitamin D deficiency   . Wears glasses     Past Surgical History:  Procedure Laterality Date  . BREAST BIOPSY Left 2009   benign  . CATARACT EXTRACTION W/ INTRAOCULAR LENS IMPLANT Left 2018  . CATARACT EXTRACTION W/PHACO Right 03/14/2014   Procedure: CATARACT EXTRACTION PHACO AND INTRAOCULAR LENS PLACEMENT (IOC);  Surgeon: Tonny Branch, MD;  Location: [facility];  Service: Ophthalmology;  Laterality: Right;  CDE: 8.34  . FOOT GANGLION EXCISION Right 2013  . HYSTEROSCOPY WITH D & C N/A 06/13/2017   Procedure: DILATATION AND CURETTAGE /HYSTEROSCOPY;  Surgeon: Alden Hipp, MD;  Location: [facility];  Service: Gynecology;  Laterality: N/A;  . IR FLUORO GUIDE PORT INSERTION RIGHT  08/27/2017  . IR REMOVAL TUN ACCESS W/  PORT W/O FL MOD SED  04/27/2018  . IR US GUIDE VASC ACCESS RIGHT  08/27/2017  . LAPAROSCOPIC CHOLECYSTECTOMY  2005  . ROBOTIC ASSISTED TOTAL HYSTERECTOMY WITH BILATERAL SALPINGO OOPHERECTOMY Bilateral 08/05/2017   Procedure: ROBOTIC ASSISTED TOTAL HYSTERECTOMY WITH BILATERAL SALPINGO OOPHORECTOMY;  Surgeon: Everitt Amber, MD;  Location: [facility];  Service: Gynecology;  Laterality: Bilateral;  . SENTINEL NODE BIOPSY Bilateral 08/05/2017   Procedure: SENTINEL NODE BIOPSY;  Surgeon: Everitt Amber, MD;  Location: [facility];  Service: Gynecology;  Laterality: Bilateral;  . UMBILICAL HERNIA REPAIR N/A 08/05/2017   Procedure: HERNIA REPAIR UMBILICAL ADULT;  Surgeon: Everitt Amber, MD;  Location: [facility];  Service: Gynecology;  Laterality: N/A;  . VEIN SURGERY Right 2011   vein stripping-leg  and laser treatment    Family History  Problem Relation Age of Onset  . Pancreatic cancer Mother 68       deceased 78  . Ovarian cancer Mother        dx 59s  . Cancer Other 60       maternal half-sister; unk. type   Social History:  reports that she has never smoked. She has never used smokeless tobacco. She reports that she does not drink alcohol and does not use drugs.  Allergies:  Allergies  Allergen Reactions  . Ace Inhibitors Cough  . Ciprocin-Fluocin-Procin [Fluocinolone] Hives  . Norvasc [Amlodipine] Cough  . Latex Rash  . Neosporin [Neomycin-Bacitracin Zn-Polymyx] Rash  .  Sulfa Antibiotics Rash    Medications Prior to Admission  Medication Sig Dispense Refill  . acetaminophen (TYLENOL) 500 MG tablet Take 1,000 mg by mouth every 8 (eight) hours as needed for mild pain or headache.    . Ascorbic Acid (VITAMIN C) 100 MG CHEW Chew 100 mg by mouth daily.    Marland Kitchen aspirin EC 81 MG tablet Take 81 mg by mouth daily.    . Calcium Citrate-Vitamin D ([employer] CALCIUM CITRATE +D PO) Take 2 tablets by mouth every evening.     . cycloSPORINE (RESTASIS) 0.05 % ophthalmic emulsion Place 1 drop into both eyes 2 (two) times daily.      . dapagliflozin propanediol (FARXIGA) 10 MG TABS tablet Take 1 tablet (10 mg total) by mouth daily before breakfast. (Patient taking differently: Take 5 mg by mouth daily.) 30 tablet 0  . furosemide (LASIX) 20 MG tablet TAKE 1/2 TABLET BY MOUTH DAILY (Patient taking differently: Take 10 mg by mouth daily.) 45 tablet 1  . glimepiride (AMARYL) 4 MG tablet TAKE 1 TABLET BY MOUTH DAILY WITH BREAKFAST (Patient taking differently: Take 4 mg by mouth daily with breakfast.) 90 tablet 0  . glucose blood (ONETOUCH VERIO) test strip Test BS daily Dx E11.9 100 each 3  . Lancets (ONETOUCH DELICA PLUS [id number]) MISC 1 each by Other route daily. Dx E11.9 100 each 3  . levothyroxine (SYNTHROID) 88 MCG tablet Take 1 tablet (88 mcg total) by mouth daily. 90 tablet 3  . losartan (COZAAR) 100 MG tablet Take 1 tablet (100 mg total) by mouth daily. 90 tablet 2  . Magnesium 250 MG TABS Take 250 mg by mouth every evening.     . meclizine (ANTIVERT) 25 MG tablet TAKE 1 TABLET (25 MG TOTAL) BY MOUTH 2 (TWO) TIMES DAILY AS NEEDED FOR DIZZINESS. 60 tablet 3  . metFORMIN (GLUCOPHAGE-XR) 500 MG 24 hr tablet TAKE 2 TABLETS BY MOUTH EVERY DAY WITH BREAKFAST (Patient taking differently: Take 1,000 mg by mouth daily with breakfast.) 180 tablet 0  . metoprolol succinate (TOPROL-XL) 50 MG 24 hr tablet Take 1 tablet (50 mg total) by mouth 2 (two) times daily. Take with or immediately following a meal. 180 tablet 2  . Omega-3 Fatty Acids (FISH OIL) 1000 MG CAPS Take 1,000 mg by mouth every evening.    Marland Kitchen PLENVU 140 g SOLR Take 1 kit by mouth as directed.    . simvastatin (ZOCOR) 40 MG tablet TAKE 1 TABLET BY MOUTH ONCE DAILY AT 6PM 90 tablet 0  . Vitamin D, Ergocalciferol, (DRISDOL) 1.25 MG (50000 UNIT) CAPS capsule Take 1 capsule (50,000 Units total) by mouth every 7 (seven) days. (Patient taking differently: Take 50,000 Units by mouth every Wednesday.) 12 capsule 3    Results for orders placed or performed during the hospital  encounter of 08/11/20 (from the past 48 hour(s))  Glucose, capillary     Status: Abnormal   Collection Time: 08/11/20  6:43 AM  Result Value Ref Range   Glucose-Capillary 123 (H) 70 - 99 mg/dL    Comment: Glucose reference range applies only to samples taken after fasting for at least 8 hours.   No results found.  Review of Systems  Constitutional: Negative.   HENT: Negative.   Eyes: Negative.   Respiratory: Negative.   Cardiovascular: Negative.   Gastrointestinal: Negative.   Endocrine: Negative.   Genitourinary: Negative.   Musculoskeletal: Negative.   Skin: Negative.   Allergic/Immunologic: Negative.   Neurological: Negative.   Hematological: Negative.  Psychiatric/Behavioral: Negative.     Blood pressure 136/88, temperature 98.4 F (36.9 C), temperature source Oral, SpO2 100 %. Physical Exam  GENERAL: The patient is AO x3, in no acute distress. Obese. HEENT: Head is normocephalic and atraumatic. EOMI are intact. Mouth is well hydrated and without lesions. NECK: Supple. No masses LUNGS: Clear to auscultation. No presence of rhonchi/wheezing/rales. Adequate chest expansion HEART: RRR, normal s1 and s2. ABDOMEN: Soft, nontender, no guarding, no peritoneal signs, and nondistended. BS +. No masses. EXTREMITIES: Without any cyanosis, clubbing, rash, lesions or edema. NEUROLOGIC: AOx3, no focal motor deficit. SKIN: no jaundice, no rashes  Assessment/Plan MACHELE DEIHL is a 75 y.o. 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 The patient is at average risk for colorectal cancer.  We will proceed with colonoscopy today.  Harvel Quale, MD 08/11/2020, 7:30 AM

## 2020-08-11 NOTE — Discharge Instructions (Signed)
Colonoscopy, Adult, Care After This sheet gives you information about how to care for yourself after your procedure. Your health care provider may also give you more specific instructions. If you have problems or questions, contact your health care provider. What can I expect after the procedure? After the procedure, it is common to have:  A small amount of blood in your stool for 24 hours after the procedure.  Some gas.  Mild cramping or bloating of your abdomen. Follow these instructions at home: Eating and drinking  Drink enough fluid to keep your urine pale yellow.  Follow instructions from your health care provider about eating or drinking restrictions.  Resume your normal diet as instructed by your health care provider. Avoid heavy or fried foods that are hard to digest.   Activity  Rest as told by your health care provider.  Avoid sitting for a long time without moving. Get up to take short walks every 1-2 hours. This is important to improve blood flow and breathing. Ask for help if you feel weak or unsteady.  Return to your normal activities as told by your health care provider. Ask your health care provider what activities are safe for you. Managing cramping and bloating  Try walking around when you have cramps or feel bloated.  Apply heat to your abdomen as told by your health care provider. Use the heat source that your health care provider recommends, such as a moist heat pack or a heating pad. ? Place a towel between your skin and the heat source. ? Leave the heat on for 20-30 minutes. ? Remove the heat if your skin turns bright red. This is especially important if you are unable to feel pain, heat, or cold. You may have a greater risk of getting burned.   General instructions  If you were given a sedative during the procedure, it can affect you for several hours. Do not drive or operate machinery until your health care provider says that it is safe.  For the first  24 hours after the procedure: ? Do not sign important documents. ? Do not drink alcohol. ? Do your regular daily activities at a slower pace than normal. ? Eat soft foods that are easy to digest.  Take over-the-counter and prescription medicines only as told by your health care provider.  Keep all follow-up visits as told by your health care provider. This is important. Contact a health care provider if:  You have blood in your stool 2-3 days after the procedure. Get help right away if you have:  More than a small spotting of blood in your stool.  Large blood clots in your stool.  Swelling of your abdomen.  Nausea or vomiting.  A fever.  Increasing pain in your abdomen that is not relieved with medicine. Summary  After the procedure, it is common to have a small amount of blood in your stool. You may also have mild cramping and bloating of your abdomen.  If you were given a sedative during the procedure, it can affect you for several hours. Do not drive or operate machinery until your health care provider says that it is safe.  Get help right away if you have a lot of blood in your stool, nausea or vomiting, a fever, or increased pain in your abdomen.   This information is not intended to replace advice given to you by your health care provider. Make sure you discuss any questions you have with your health care provider.  Document Revised: 06/11/2019 Document Reviewed: 01/11/2019 Elsevier Patient Education  2021 [place] are being discharged to home.  Resume your previous diet.  We are waiting for your pathology results.  Your physician has indicated that a repeat colonoscopy is not recommended due to your current age (38 years or older) for screening purposes.

## 2020-08-14 LAB — SURGICAL PATHOLOGY

## 2020-08-16 ENCOUNTER — Encounter (HOSPITAL_COMMUNITY): Payer: Self-pay | Admitting: Gastroenterology

## 2020-08-31 DIAGNOSIS — L84 Corns and callosities: Secondary | ICD-10-CM | POA: Diagnosis not present

## 2020-08-31 DIAGNOSIS — M79676 Pain in unspecified toe(s): Secondary | ICD-10-CM | POA: Diagnosis not present

## 2020-08-31 DIAGNOSIS — B351 Tinea unguium: Secondary | ICD-10-CM | POA: Diagnosis not present

## 2020-08-31 DIAGNOSIS — E1142 Type 2 diabetes mellitus with diabetic polyneuropathy: Secondary | ICD-10-CM | POA: Diagnosis not present

## 2020-09-07 ENCOUNTER — Other Ambulatory Visit: Payer: Self-pay | Admitting: Family

## 2020-09-11 ENCOUNTER — Other Ambulatory Visit: Payer: Self-pay | Admitting: Family

## 2020-09-11 DIAGNOSIS — E1169 Type 2 diabetes mellitus with other specified complication: Secondary | ICD-10-CM

## 2020-10-06 ENCOUNTER — Other Ambulatory Visit: Payer: Self-pay

## 2020-10-06 ENCOUNTER — Other Ambulatory Visit: Payer: Self-pay | Admitting: Family

## 2020-10-06 DIAGNOSIS — E1169 Type 2 diabetes mellitus with other specified complication: Secondary | ICD-10-CM

## 2020-10-22 ENCOUNTER — Other Ambulatory Visit: Payer: Self-pay | Admitting: Family

## 2020-10-22 DIAGNOSIS — E1169 Type 2 diabetes mellitus with other specified complication: Secondary | ICD-10-CM

## 2020-10-27 ENCOUNTER — Ambulatory Visit (INDEPENDENT_AMBULATORY_CARE_PROVIDER_SITE_OTHER): Payer: Medicare Other | Admitting: Family

## 2020-10-27 ENCOUNTER — Encounter: Payer: Self-pay | Admitting: Family

## 2020-10-27 ENCOUNTER — Other Ambulatory Visit: Payer: Self-pay

## 2020-10-27 VITALS — BP 128/79 | HR 53 | Temp 97.4°F | Ht 61.0 in | Wt 163.4 lb

## 2020-10-27 DIAGNOSIS — E1159 Type 2 diabetes mellitus with other circulatory complications: Secondary | ICD-10-CM | POA: Diagnosis not present

## 2020-10-27 DIAGNOSIS — E669 Obesity, unspecified: Secondary | ICD-10-CM

## 2020-10-27 DIAGNOSIS — E1169 Type 2 diabetes mellitus with other specified complication: Secondary | ICD-10-CM

## 2020-10-27 DIAGNOSIS — K760 Fatty (change of) liver, not elsewhere classified: Secondary | ICD-10-CM

## 2020-10-27 DIAGNOSIS — E559 Vitamin D deficiency, unspecified: Secondary | ICD-10-CM

## 2020-10-27 DIAGNOSIS — I152 Hypertension secondary to endocrine disorders: Secondary | ICD-10-CM

## 2020-10-27 DIAGNOSIS — E785 Hyperlipidemia, unspecified: Secondary | ICD-10-CM

## 2020-10-27 DIAGNOSIS — R42 Dizziness and giddiness: Secondary | ICD-10-CM | POA: Diagnosis not present

## 2020-10-27 DIAGNOSIS — R300 Dysuria: Secondary | ICD-10-CM | POA: Diagnosis not present

## 2020-10-27 DIAGNOSIS — E039 Hypothyroidism, unspecified: Secondary | ICD-10-CM | POA: Diagnosis not present

## 2020-10-27 DIAGNOSIS — C541 Malignant neoplasm of endometrium: Secondary | ICD-10-CM

## 2020-10-27 LAB — BAYER DCA HB A1C WAIVED: HB A1C (BAYER DCA - WAIVED): 7.2 % — ABNORMAL HIGH (ref <7.0)

## 2020-10-27 MED ORDER — SIMVASTATIN 40 MG PO TABS: 40.0000 mg | ORAL_TABLET | Freq: Every day | ORAL | 1 refills | Status: DC

## 2020-10-27 MED ORDER — LEVOTHYROXINE SODIUM 88 MCG PO TABS: 88.0000 ug | ORAL_TABLET | Freq: Every day | ORAL | 1 refills | Status: DC

## 2020-10-27 MED ORDER — GLIMEPIRIDE 4 MG PO TABS: 4.0000 mg | ORAL_TABLET | Freq: Every day | ORAL | 0 refills | Status: DC

## 2020-10-27 MED ORDER — METOPROLOL SUCCINATE ER 50 MG PO TB24: 50.0000 mg | ORAL_TABLET | Freq: Two times a day (BID) | ORAL | 2 refills | Status: DC

## 2020-10-27 MED ORDER — METFORMIN HCL ER 500 MG PO TB24: 1000.0000 mg | ORAL_TABLET | Freq: Every day | ORAL | 0 refills | Status: DC

## 2020-10-27 MED ORDER — LOSARTAN POTASSIUM 100 MG PO TABS: 100.0000 mg | ORAL_TABLET | Freq: Every day | ORAL | 2 refills | Status: DC

## 2020-10-27 MED ORDER — DAPAGLIFLOZIN PROPANEDIOL 10 MG PO TABS: 10.0000 mg | ORAL_TABLET | Freq: Every day | ORAL | 0 refills | Status: DC

## 2020-10-27 MED ORDER — FLUCONAZOLE 150 MG PO TABS: 150.0000 mg | ORAL_TABLET | ORAL | 0 refills | Status: DC | PRN

## 2020-10-27 MED ORDER — FUROSEMIDE 20 MG PO TABS: 10.0000 mg | ORAL_TABLET | Freq: Every day | ORAL | 1 refills | Status: DC

## 2020-10-27 NOTE — Patient Instructions (Signed)
Health Maintenance After Age 77 After age 77, you are at a higher risk for certain long-term diseases and infections as well as injuries from falls. Falls are a major cause of broken bones and head injuries in people who are older than age 77. Getting regular preventive care can help to keep you healthy and well. Preventive care includes getting regular testing and making lifestyle changes as recommended by your health care provider. Talk with your health care provider about:  Which screenings and tests you should have. A screening is a test that checks for a disease when you have no symptoms.  A diet and exercise plan that is right for you. What should I know about screenings and tests to prevent falls? Screening and testing are the best ways to find a health problem early. Early diagnosis and treatment give you the best chance of managing medical conditions that are common after age 77. Certain conditions and lifestyle choices may make you more likely to have a fall. Your health care provider may recommend:  Regular vision checks. Poor vision and conditions such as cataracts can make you more likely to have a fall. If you wear glasses, make sure to get your prescription updated if your vision changes.  Medicine review. Work with your health care provider to regularly review all of the medicines you are taking, including over-the-counter medicines. Ask your health care provider about any side effects that may make you more likely to have a fall. Tell your health care provider if any medicines that you take make you feel dizzy or sleepy.  Osteoporosis screening. Osteoporosis is a condition that causes the bones to get weaker. This can make the bones weak and cause them to break more easily.  Blood pressure screening. Blood pressure changes and medicines to control blood pressure can make you feel dizzy.  Strength and balance checks. Your health care provider may recommend certain tests to check your  strength and balance while standing, walking, or changing positions.  Foot health exam. Foot pain and numbness, as well as not wearing proper footwear, can make you more likely to have a fall.  Depression screening. You may be more likely to have a fall if you have a fear of falling, feel emotionally low, or feel unable to do activities that you used to do.  Alcohol use screening. Using too much alcohol can affect your balance and may make you more likely to have a fall. What actions can I take to lower my risk of falls? General instructions  Talk with your health care provider about your risks for falling. Tell your health care provider if: ? You fall. Be sure to tell your health care provider about all falls, even ones that seem minor. ? You feel dizzy, sleepy, or off-balance.  Take over-the-counter and prescription medicines only as told by your health care provider. These include any supplements.  Eat a healthy diet and maintain a healthy weight. A healthy diet includes low-fat dairy products, low-fat (lean) meats, and fiber from whole grains, beans, and lots of fruits and vegetables. Home safety  Remove any tripping hazards, such as rugs, cords, and clutter.  Install safety equipment such as grab bars in bathrooms and safety rails on stairs.  Keep rooms and walkways well-lit. Activity  Follow a regular exercise program to stay fit. This will help you maintain your balance. Ask your health care provider what types of exercise are appropriate for you.  If you need a cane or walker,   use it as recommended by your health care provider.  Wear supportive shoes that have nonskid soles.   Lifestyle  Do not drink alcohol if your health care provider tells you not to drink.  If you drink alcohol, limit how much you have: ? 0-1 drink a day for women. ? 0-2 drinks a day for men.  Be aware of how much alcohol is in your drink. In the [place] one drink equals one typical bottle of beer (12  oz), one-half glass of wine (5 oz), or one shot of hard liquor (1 oz).  Do not use any products that contain nicotine or tobacco, such as cigarettes and e-cigarettes. If you need help quitting, ask your health care provider. Summary  Having a healthy lifestyle and getting preventive care can help to protect your health and wellness after age 77.  Screening and testing are the best way to find a health problem early and help you avoid having a fall. Early diagnosis and treatment give you the best chance for managing medical conditions that are more common for people who are older than age 77.  Falls are a major cause of broken bones and head injuries in people who are older than age 77. Take precautions to prevent a fall at home.  Work with your health care provider to learn what changes you can make to improve your health and wellness and to prevent falls. This information is not intended to replace advice given to you by your health care provider. Make sure you discuss any questions you have with your health care provider. Document Revised: 10/08/2018 Document Reviewed: 04/30/2017 Elsevier Patient Education  2021 Elsevier Inc.  

## 2020-10-27 NOTE — Progress Notes (Signed)
Subjective:    Patient ID: Alexandria Irwin, female    DOB: [birth year], 75 y.o.   MRN: [id number]  Chief Complaint  Patient presents with  . Diabetes  . Hypertension  . Vaginal Itching    AND BURNING USING MONISTAT    Pt presents to the office today for chronic follow up. [name] Endometrial Adenocarcinomaand followed byOncologists every 6 months. She has completed chemo and radiation. 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  She fell 12/2019 and broke right elbow, she fell in the shower in 07/02/2019 and broke her right shoulder. These are stable at this time.  Diabetes She presents for her follow-up diabetic visit. She has type 2 diabetes mellitus. There are no hypoglycemic associated symptoms. Associated symptoms include blurred vision, fatigue and foot paresthesias. Symptoms are stable. Diabetic complications include heart disease, nephropathy and peripheral neuropathy. Risk factors for coronary artery disease include dyslipidemia, diabetes mellitus, hypertension, sedentary lifestyle and post-menopausal. She is following a generally unhealthy diet. Her overall blood glucose range is 130-140 mg/dl. An ACE inhibitor/angiotensin II receptor blocker is being taken.  Hypertension This is a chronic problem. The current episode started more than 1 year ago. The problem has been resolved since onset. The problem is controlled. Associated symptoms include blurred vision and malaise/fatigue. Pertinent negatives include no peripheral edema or shortness of breath. Risk factors for coronary artery disease include dyslipidemia, diabetes mellitus and sedentary lifestyle. The current treatment provides moderate improvement. Identifiable causes of hypertension include a thyroid problem.  Vaginal Itching The patient's primary symptoms include genital itching. The patient's pertinent negatives include no vaginal bleeding or vaginal discharge. This is a new  problem. The current episode started more than 1 month ago. The problem occurs intermittently. Pertinent negatives include no constipation.  Thyroid Problem Presents for follow-up visit. Symptoms include fatigue. Patient reports no constipation, dry skin, hair loss or hoarse voice. The symptoms have been stable. Her past medical history is significant for hyperlipidemia.  Hyperlipidemia This is a chronic problem. The current episode started more than 1 year ago. The problem is controlled. Exacerbating diseases include obesity. Pertinent negatives include no shortness of breath. Current antihyperlipidemic treatment includes statins. The current treatment provides moderate improvement of lipids. Risk factors for coronary artery disease include dyslipidemia, a sedentary lifestyle and post-menopausal.  Anemia Presents for follow-up visit. Symptoms include malaise/fatigue.      Review of Systems  Constitutional: Positive for fatigue and malaise/fatigue.  HENT: Negative for hoarse voice.   Eyes: Positive for blurred vision.  Respiratory: Negative for shortness of breath.   Gastrointestinal: Negative for constipation.  Genitourinary: Negative for vaginal discharge.  All other systems reviewed and are negative.      Objective:   Physical Exam Vitals reviewed.  Constitutional:      General: She is not in acute distress.    Appearance: She is well-developed.  HENT:     Head: Normocephalic and atraumatic.     Right Ear: Tympanic membrane normal.     Left Ear: Tympanic membrane normal.  Eyes:     Pupils: Pupils are equal, round, and reactive to light.  Neck:     Thyroid: No thyromegaly.  Cardiovascular:     Rate and Rhythm: Normal rate and regular rhythm.     Heart sounds: Normal heart sounds. No murmur heard.   Pulmonary:     Effort: Pulmonary effort is normal. No respiratory distress.     Breath sounds: Normal breath  sounds. No wheezing.  Abdominal:     General: Bowel sounds are  normal. There is no distension.     Palpations: Abdomen is soft.     Tenderness: There is no abdominal tenderness.  Musculoskeletal:        General: No tenderness. Normal range of motion.     Cervical back: Normal range of motion and neck supple.     Right lower leg: Edema (trace) present.     Left lower leg: Edema (trace) present.  Skin:    General: Skin is warm and dry.  Neurological:     Mental Status: She is alert and oriented to person, place, and time.     Cranial Nerves: No cranial nerve deficit.     Deep Tendon Reflexes: Reflexes are normal and symmetric.  Psychiatric:        Behavior: Behavior normal.        Thought Content: Thought content normal.        Judgment: Judgment normal.       BP 128/79   Pulse (!) 53   Temp (!) 97.4 F (36.3 C) (Temporal)   Ht _0  (1.549 m)   Wt 163 lb 6.4 oz (74.1 kg)   BMI 30.87 kg/m      Assessment & Plan:  Alexandria Irwin comes in today with chief complaint of Diabetes, Hypertension, and Vaginal Itching (AND BURNING USING MONISTAT )   Diagnosis and orders addressed:  1. Type 2 diabetes mellitus with other specified complication, without long-term current use of insulin (HCC) - Bayer DCA Hb A1c Waived - CBC with Differential/Platelet - Microalbumin / creatinine urine ratio - glimepiride (AMARYL) 4 MG tablet; Take 1 tablet (4 mg total) by mouth daily with breakfast. (NEEDS TO BE SEEN BEFORE NEXT REFILL)  Dispense: 30 tablet; Refill: 0 - metFORMIN (GLUCOPHAGE-XR) 500 MG 24 hr tablet; Take 2 tablets (1,000 mg total) by mouth daily with breakfast.  Dispense: 60 tablet; Refill: 0  2. Hyperlipidemia associated with type 2 diabetes mellitus ([place]) - CBC with Differential/Platelet - Lipid panel  3. Hypertension associated with diabetes ([place]) - CBC with Differential/Platelet - CMP14+EGFR - Lipid panel - metoprolol succinate (TOPROL-XL) 50 MG 24 hr tablet; Take 1 tablet (50 mg total) by mouth 2 (two) times daily. Take with or  immediately following a meal.  Dispense: 180 tablet; Refill: 2 - losartan (COZAAR) 100 MG tablet; Take 1 tablet (100 mg total) by mouth daily.  Dispense: 90 tablet; Refill: 2  4. Hypothyroidism, unspecified type - TSH  5. Dizziness - furosemide (LASIX) 20 MG tablet; Take 0.5 tablets (10 mg total) by mouth daily.  Dispense: 45 tablet; Refill: 1  6. Fatty liver  7. Endometrial adenocarcinoma ([place])   8. Obesity (BMI 30-39.9)  9. Vitamin D deficiency  10. Dysuria - Urinalysis   Labs pending Health Maintenance reviewed Diet and exercise encouraged  Follow up plan: 6 months    Evelina Dun, FNP

## 2020-10-28 LAB — TSH: TSH: 2.61 u[IU]/mL (ref 0.450–4.500)

## 2020-10-28 LAB — CMP14+EGFR
ALT: 21 IU/L (ref 0–32)
AST: 19 IU/L (ref 0–40)
Albumin/Globulin Ratio: 2 (ref 1.2–2.2)
Albumin: 4.8 g/dL — ABNORMAL HIGH (ref 3.7–4.7)
Alkaline Phosphatase: 41 IU/L — ABNORMAL LOW (ref 44–121)
BUN/Creatinine Ratio: 18 (ref 12–28)
BUN: 21 mg/dL (ref 8–27)
Bilirubin Total: 0.9 mg/dL (ref 0.0–1.2)
CO2: 25 mmol/L (ref 20–29)
Calcium: 10.4 mg/dL — ABNORMAL HIGH (ref 8.7–10.3)
Chloride: 101 mmol/L (ref 96–106)
Creatinine, Ser: 1.18 mg/dL — ABNORMAL HIGH (ref 0.57–1.00)
Globulin, Total: 2.4 g/dL (ref 1.5–4.5)
Glucose: 135 mg/dL — ABNORMAL HIGH (ref 65–99)
Potassium: 5.6 mmol/L — ABNORMAL HIGH (ref 3.5–5.2)
Sodium: 142 mmol/L (ref 134–144)
Total Protein: 7.2 g/dL (ref 6.0–8.5)
eGFR: 48 mL/min/{1.73_m2} — ABNORMAL LOW (ref >59)

## 2020-10-28 LAB — MICROALBUMIN / CREATININE URINE RATIO
Creatinine, Urine: 199.9 mg/dL
Microalb/Creat Ratio: 24 mg/g creat (ref 0–29)
Microalbumin, Urine: 47.5 ug/mL

## 2020-10-28 LAB — CBC WITH DIFFERENTIAL/PLATELET
Basophils Absolute: 0 10*3/uL (ref 0.0–0.2)
Basos: 1 %
EOS (ABSOLUTE): 0.1 10*3/uL (ref 0.0–0.4)
Eos: 1 %
Hematocrit: 45.4 % (ref 34.0–46.6)
Hemoglobin: 15.2 g/dL (ref 11.1–15.9)
Immature Grans (Abs): 0 10*3/uL (ref 0.0–0.1)
Immature Granulocytes: 0 %
Lymphocytes Absolute: 3.4 10*3/uL — ABNORMAL HIGH (ref 0.7–3.1)
Lymphs: 44 %
MCH: 32.4 pg (ref 26.6–33.0)
MCHC: 33.5 g/dL (ref 31.5–35.7)
MCV: 97 fL (ref 79–97)
Monocytes Absolute: 0.5 10*3/uL (ref 0.1–0.9)
Monocytes: 6 %
Neutrophils Absolute: 3.8 10*3/uL (ref 1.4–7.0)
Neutrophils: 48 %
Platelets: 205 10*3/uL (ref 150–450)
RBC: 4.69 x10E6/uL (ref 3.77–5.28)
RDW: 13.1 % (ref 11.7–15.4)
WBC: 7.8 10*3/uL (ref 3.4–10.8)

## 2020-10-28 LAB — LIPID PANEL
Chol/HDL Ratio: 2.7 ratio (ref 0.0–4.4)
Cholesterol, Total: 128 mg/dL (ref 100–199)
HDL: 47 mg/dL (ref >39)
LDL Chol Calc (NIH): 56 mg/dL (ref 0–99)
Triglycerides: 142 mg/dL (ref 0–149)
VLDL Cholesterol Cal: 25 mg/dL (ref 5–40)

## 2020-10-31 ENCOUNTER — Other Ambulatory Visit: Payer: Self-pay | Admitting: Family

## 2020-10-31 ENCOUNTER — Encounter: Payer: Self-pay | Admitting: Radiology

## 2020-10-31 ENCOUNTER — Other Ambulatory Visit: Payer: Self-pay

## 2020-10-31 DIAGNOSIS — E875 Hyperkalemia: Secondary | ICD-10-CM

## 2020-11-02 DIAGNOSIS — E1142 Type 2 diabetes mellitus with diabetic polyneuropathy: Secondary | ICD-10-CM | POA: Diagnosis not present

## 2020-11-02 DIAGNOSIS — L84 Corns and callosities: Secondary | ICD-10-CM | POA: Diagnosis not present

## 2020-11-02 DIAGNOSIS — M79676 Pain in unspecified toe(s): Secondary | ICD-10-CM | POA: Diagnosis not present

## 2020-11-02 DIAGNOSIS — B351 Tinea unguium: Secondary | ICD-10-CM | POA: Diagnosis not present

## 2020-11-03 ENCOUNTER — Other Ambulatory Visit: Payer: Self-pay

## 2020-11-03 ENCOUNTER — Other Ambulatory Visit: Payer: Medicare Other

## 2020-11-03 DIAGNOSIS — E875 Hyperkalemia: Secondary | ICD-10-CM | POA: Diagnosis not present

## 2020-11-03 LAB — CMP14+EGFR
ALT: 22 IU/L (ref 0–32)
AST: 23 IU/L (ref 0–40)
Albumin/Globulin Ratio: 1.7 (ref 1.2–2.2)
Albumin: 4.6 g/dL (ref 3.7–4.7)
Alkaline Phosphatase: 40 IU/L — ABNORMAL LOW (ref 44–121)
BUN/Creatinine Ratio: 12 (ref 12–28)
BUN: 17 mg/dL (ref 8–27)
Bilirubin Total: 0.9 mg/dL (ref 0.0–1.2)
CO2: 23 mmol/L (ref 20–29)
Calcium: 10.3 mg/dL (ref 8.7–10.3)
Chloride: 102 mmol/L (ref 96–106)
Creatinine, Ser: 1.44 mg/dL — ABNORMAL HIGH (ref 0.57–1.00)
Globulin, Total: 2.7 g/dL (ref 1.5–4.5)
Glucose: 169 mg/dL — ABNORMAL HIGH (ref 65–99)
Potassium: 5 mmol/L (ref 3.5–5.2)
Sodium: 143 mmol/L (ref 134–144)
Total Protein: 7.3 g/dL (ref 6.0–8.5)
eGFR: 37 mL/min/{1.73_m2} — ABNORMAL LOW (ref >59)

## 2020-11-08 NOTE — Progress Notes (Signed)
Radiation Oncology         [phone] ________________________________  Name: Alexandria Irwin MRN: [id number]  Date: 11/09/2020  DOB: September 24, 1945  Follow-Up Visit Note  CC: Sharion Balloon, FNP  Everitt Amber, MD    ICD-10-CM   1. 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 She last saw Dr. Denman George on 04/14/21, during which time she was noted to have a complete clinical response. A biopsy of erythematous plaque at top right of vagina was performed at that time and showed squamous mucosa with inflammation and granulation tissue consistent  with ulcer.  Of note, she underwent colonoscopy on 08/11/20 under Dr. Montez Morita.  On review of systems, she is without complaints today. She denies pain, dysuria or hematuria, vaginal discharge or bleeding, rectal bleeding, diarrhea or constipation, abdominal bloating, and nausea or vomiting.  She reports no new medical issues but the last few months have been difficult for her since her husband died of heart issues.  ALLERGIES:  is allergic to ace inhibitors, ciprocin-fluocin-procin [fluocinolone], norvasc [amlodipine], latex, neosporin [neomycin-bacitracin zn-polymyx], and sulfa antibiotics.  Meds: Current Outpatient Medications  Medication Sig Dispense Refill  . Ascorbic Acid (VITAMIN C) 100 MG CHEW Chew 100 mg by mouth daily.    Marland Kitchen aspirin EC 81 MG tablet Take 81 mg by mouth daily.    . Calcium Citrate-Vitamin D ([employer] CALCIUM CITRATE +D PO) Take 2 tablets by mouth every evening.     . cycloSPORINE (RESTASIS) 0.05 % ophthalmic emulsion Place 1 drop into both eyes 2 (two) times daily.     .  dapagliflozin propanediol (FARXIGA) 10 MG TABS tablet Take 1 tablet (10 mg total) by mouth daily before breakfast. 30 tablet 0  . furosemide (LASIX) 20 MG tablet Take 0.5 tablets (10 mg total) by mouth daily. 45 tablet 1  . glimepiride (AMARYL) 4 MG tablet Take 1 tablet (4 mg total) by mouth daily with breakfast. (NEEDS TO BE SEEN BEFORE NEXT REFILL) 30 tablet 0  . glucose blood (ONETOUCH VERIO) test strip Test BS daily Dx E11.9 100 each 3  . Lancets (ONETOUCH DELICA PLUS XYVOPF29W) MISC 1 each by Other route daily. Dx E11.9 100 each 3  . levothyroxine (SYNTHROID) 88 MCG tablet Take 1 tablet (88 mcg total) by mouth daily. 90 tablet 1  . losartan (COZAAR) 100 MG tablet Take 1 tablet (100 mg total) by mouth daily. 90 tablet 2  . Magnesium 250 MG TABS Take 250 mg by mouth every evening.     . meclizine (ANTIVERT) 25 MG tablet TAKE 1 TABLET (25 MG TOTAL) BY MOUTH 2 (TWO) TIMES DAILY AS NEEDED FOR DIZZINESS. 60 tablet 3  . metFORMIN (GLUCOPHAGE-XR) 500 MG 24 hr tablet Take 2 tablets (1,000 mg total) by mouth daily with breakfast. 60 tablet 0  . metoprolol succinate (TOPROL-XL) 50 MG 24 hr tablet Take 1 tablet (50 mg total) by mouth 2 (two) times daily. Take with or immediately following a meal. 180 tablet 2  . Omega-3 Fatty Acids (FISH OIL) 1000 MG CAPS Take 1,000 mg by mouth every evening.    . simvastatin (ZOCOR) 40 MG tablet Take 1 tablet (40 mg total) by  mouth daily at 6 PM. 90 tablet 1  . Vitamin D, Ergocalciferol, (DRISDOL) 1.25 MG (50000 UNIT) CAPS capsule Take 1 capsule (50,000 Units total) by mouth every 7 (seven) days. (Patient taking differently: Take 50,000 Units by mouth every Wednesday.) 12 capsule 3  . acetaminophen (TYLENOL) 500 MG tablet Take 1,000 mg by mouth every 8 (eight) hours as needed for mild pain or headache. (Patient not taking: Reported on 11/09/2020)    . fluconazole (DIFLUCAN) 150 MG tablet Take 1 tablet (150 mg total) by mouth every three (3) days as needed. (Patient not  taking: Reported on 11/09/2020) 3 tablet 0  . PLENVU 140 g SOLR Take 1 kit by mouth as directed. (Patient not taking: Reported on 11/09/2020)     No current facility-administered medications for this encounter.    Physical Findings: The patient is in no acute distress. Patient is alert and oriented.  height is _0  (1.549 m) and weight is 165 lb 4 oz (75 kg). Her temporal temperature is 96.9 F (36.1 C) (abnormal). Her blood pressure is 157/86 (abnormal) and her pulse is 60. Her respiration is 18 and oxygen saturation is 95%.   Lungs are clear to auscultation bilaterally. Heart has regular rate and rhythm. No palpable cervical, supraclavicular, or axillary adenopathy. Abdomen soft, non-tender, normal bowel sounds.  On pelvic examination the external genitalia were unremarkable. A speculum exam was performed. There are no mucosal lesions noted in the vaginal vault. Vaginal cuff intact. On bimanual  examination there were no pelvic masses appreciated.  Some radiation changes noted along the vaginal cuff.  This area bleeds slightly with manipulation.   Lab Findings: Lab Results  Component Value Date   WBC 7.8 10/27/2020   HGB 15.2 10/27/2020   HCT 45.4 10/27/2020   MCV 97 10/27/2020   PLT 205 10/27/2020                                                               Radiographic Findings: No results found.  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  No apparent chronic toxicities from her surgery or postop radiation therapy  Plan:  The patient will follow-up with Dr. Denman George in 6 months and with radiation oncology in 1 year. ____________________________________  Blair Promise, PhD, MD  This document serves as a record of services personally performed by Gery Pray, MD. It was created on his behalf by Roney Mans, a trained medical scribe. The creation of this  record is based on the scribe's personal observations and the provider's statements to them. This document has been checked and approved by the attending provider.

## 2020-11-09 ENCOUNTER — Other Ambulatory Visit: Payer: Self-pay

## 2020-11-09 ENCOUNTER — Ambulatory Visit
Admission: RE | Admit: 2020-11-09 | Discharge: 2020-11-09 | Disposition: A | Discharge status: Home / Self Care | Payer: Medicare Other | Source: Ambulatory Visit | Attending: Radiation Oncology | Admitting: Radiation Oncology

## 2020-11-09 ENCOUNTER — Encounter: Payer: Self-pay | Admitting: Radiation Oncology

## 2020-11-09 VITALS — BP 157/86 | HR 60 | Temp 96.9°F | Resp 18 | Ht 61.0 in | Wt 165.2 lb

## 2020-11-09 DIAGNOSIS — C541 Malignant neoplasm of endometrium: Secondary | ICD-10-CM

## 2020-11-09 DIAGNOSIS — Z8542 Personal history of malignant neoplasm of other parts of uterus: Secondary | ICD-10-CM | POA: Insufficient documentation

## 2020-11-09 DIAGNOSIS — Z923 Personal history of irradiation: Secondary | ICD-10-CM | POA: Diagnosis not present

## 2020-11-09 DIAGNOSIS — Z79899 Other long term (current) drug therapy: Secondary | ICD-10-CM | POA: Insufficient documentation

## 2020-11-09 DIAGNOSIS — Z7982 Long term (current) use of aspirin: Secondary | ICD-10-CM | POA: Diagnosis not present

## 2020-11-09 DIAGNOSIS — Z08 Encounter for follow-up examination after completed treatment for malignant neoplasm: Secondary | ICD-10-CM | POA: Diagnosis not present

## 2020-11-09 DIAGNOSIS — Z7984 Long term (current) use of oral hypoglycemic drugs: Secondary | ICD-10-CM | POA: Insufficient documentation

## 2020-11-09 NOTE — Progress Notes (Signed)
Alexandria Irwin is here today for follow up post radiation to the pelvic.  They completed their radiation on: 10/13/2017   Does the patient complain of any of the following:  . Pain:denies . Abdominal bloating: denies . Diarrhea/Constipation: denies . Nausea/Vomiting: denies . Vaginal Discharge: denies . Blood in Urine or Stool: denies . Urinary Issues (dysuria/incomplete emptying/ incontinence/ increased frequency/urgency): denies dysuria, frequency, urgency, incontinence or incomplete emptying. . Does patient report using vaginal dilator 2-3 times a week and/or sexually active 2-3 weeks: no . Post radiation skin changes: denies   Additional comments if applicable: States blood pressure increases when going to the doctor's office. Has taken medication today.   Vitals:   11/09/20 1034  BP: (!) 157/86  Pulse: 60  Resp: 18  Temp: (!) 96.9 F (36.1 C)  TempSrc: Temporal  SpO2: 95%  Weight: 165 lb 4 oz (75 kg)  Height: 5\' 1"  (1.549 m)

## 2020-11-17 ENCOUNTER — Other Ambulatory Visit: Payer: Self-pay | Admitting: *Deleted

## 2020-11-17 DIAGNOSIS — E1169 Type 2 diabetes mellitus with other specified complication: Secondary | ICD-10-CM

## 2020-11-17 MED ORDER — METFORMIN HCL ER 500 MG PO TB24: 1000.0000 mg | ORAL_TABLET | Freq: Every day | ORAL | 0 refills | Status: DC

## 2020-11-18 ENCOUNTER — Other Ambulatory Visit: Payer: Self-pay | Admitting: Family

## 2020-11-18 DIAGNOSIS — E1169 Type 2 diabetes mellitus with other specified complication: Secondary | ICD-10-CM

## 2020-11-22 ENCOUNTER — Other Ambulatory Visit: Payer: Self-pay | Admitting: Family

## 2020-11-22 DIAGNOSIS — R42 Dizziness and giddiness: Secondary | ICD-10-CM

## 2020-11-30 ENCOUNTER — Other Ambulatory Visit: Payer: Self-pay | Admitting: Family

## 2020-11-30 DIAGNOSIS — E119 Type 2 diabetes mellitus without complications: Secondary | ICD-10-CM

## 2020-12-12 ENCOUNTER — Other Ambulatory Visit: Payer: Self-pay | Admitting: Family

## 2020-12-12 DIAGNOSIS — E559 Vitamin D deficiency, unspecified: Secondary | ICD-10-CM

## 2020-12-19 ENCOUNTER — Encounter: Payer: Self-pay | Admitting: *Deleted

## 2021-01-11 DIAGNOSIS — M79676 Pain in unspecified toe(s): Secondary | ICD-10-CM | POA: Diagnosis not present

## 2021-01-11 DIAGNOSIS — B351 Tinea unguium: Secondary | ICD-10-CM | POA: Diagnosis not present

## 2021-01-11 DIAGNOSIS — L84 Corns and callosities: Secondary | ICD-10-CM | POA: Diagnosis not present

## 2021-01-11 DIAGNOSIS — E1142 Type 2 diabetes mellitus with diabetic polyneuropathy: Secondary | ICD-10-CM | POA: Diagnosis not present

## 2021-01-24 DIAGNOSIS — E119 Type 2 diabetes mellitus without complications: Secondary | ICD-10-CM

## 2021-01-24 MED ORDER — ONETOUCH DELICA PLUS LANCET33G MISC: 1.0000 | Freq: Every day | 8 refills | Status: DC

## 2021-01-30 ENCOUNTER — Ambulatory Visit (INDEPENDENT_AMBULATORY_CARE_PROVIDER_SITE_OTHER): Payer: Medicare Other

## 2021-01-30 VITALS — Ht 61.0 in | Wt 165.0 lb

## 2021-01-30 DIAGNOSIS — Z0000 Encounter for general adult medical examination without abnormal findings: Secondary | ICD-10-CM | POA: Diagnosis not present

## 2021-01-30 NOTE — Progress Notes (Addendum)
Subjective:   Alexandria Irwin is a 75 y.o. female who presents for Medicare Annual (Subsequent) preventive examination.  Virtual Visit via Telephone Note  I connected with  Alexandria Irwin on 01/30/21 at  9:00 AM EDT by telephone and verified that I am speaking with the correct person using two identifiers.  Location: Patient: Home Provider: [facility] Persons participating in the virtual visit: patient/Nurse Health Advisor   I discussed the limitations, risks, security and privacy concerns of performing an evaluation and management service by telephone and the availability of in person appointments. The patient expressed understanding and agreed to proceed.  Interactive audio and video telecommunications were attempted between this nurse and patient, however failed, due to patient having technical difficulties OR patient did not have access to video capability.  We continued and completed visit with audio only.  Some vital signs may be absent or patient reported.   Davonta Stroot E Remmi Armenteros, LPN   Review of Systems     Cardiac Risk Factors include: advanced age (>32mn, >>67women);diabetes mellitus;dyslipidemia;hypertension;obesity (BMI >30kg/m2);sedentary lifestyle     Objective:    Today's Vitals   01/30/21 0859  Weight: 165 lb (74.8 kg)  Height: 5' 1" (1.549 m)   Body mass index is 31.18 kg/m.  Advanced Directives 01/30/2021 11/09/2020 08/09/2020 03/29/2020 01/28/2020 11/04/2019 08/03/2019  Does Patient Have a Medical Advance Directive? _0  No No  Would patient like information on creating a medical advance directive? No - Patient declined No - Patient declined No - Patient declined - No - Patient declined No - Patient declined -    Current Medications (verified) Outpatient Encounter Medications as of 01/30/2021  Medication Sig   Ascorbic Acid (VITAMIN C) 100 MG CHEW Chew 100 mg by mouth daily.   aspirin EC 81 MG tablet Take 81 mg by mouth daily.   Calcium Citrate-Vitamin D ([employer] CALCIUM  CITRATE +D PO) Take 2 tablets by mouth every evening.    cycloSPORINE (RESTASIS) 0.05 % ophthalmic emulsion Place 1 drop into both eyes 2 (two) times daily.    dapagliflozin propanediol (FARXIGA) 10 MG TABS tablet Take 1 tablet (10 mg total) by mouth daily before breakfast.   furosemide (LASIX) 20 MG tablet Take 0.5 tablets (10 mg total) by mouth daily.   glimepiride (AMARYL) 4 MG tablet Take 1 tablet (4 mg total) by mouth daily with breakfast.   glucose blood (ONETOUCH VERIO) test strip USE TO TEST BLOOD SUGAR DAILY Dx E11.9   Lancets (ONETOUCH DELICA PLUS [id number] MISC 1 each by Other route daily. Dx E11.9   levothyroxine (SYNTHROID) 88 MCG tablet Take 1 tablet (88 mcg total) by mouth daily.   losartan (COZAAR) 100 MG tablet Take 1 tablet (100 mg total) by mouth daily.   Magnesium 250 MG TABS Take 250 mg by mouth every evening.    meclizine (ANTIVERT) 25 MG tablet TAKE 1 TABLET (25 MG TOTAL) BY MOUTH 2 (TWO) TIMES DAILY AS NEEDED FOR DIZZINESS.   metFORMIN (GLUCOPHAGE-XR) 500 MG 24 hr tablet TAKE 2 TABLETS BY MOUTH EVERY DAY WITH BREAKFAST   metoprolol succinate (TOPROL-XL) 50 MG 24 hr tablet Take 1 tablet (50 mg total) by mouth 2 (two) times daily. Take with or immediately following a meal.   Omega-3 Fatty Acids (FISH OIL) 1000 MG CAPS Take 1,000 mg by mouth every evening.   simvastatin (ZOCOR) 40 MG tablet Take 1 tablet (40 mg total) by mouth daily at 6 PM.   Vitamin D, Ergocalciferol, (DRISDOL)  1.25 MG (50000 UNIT) CAPS capsule TAKE 1 CAPSULE (50,000 UNITS TOTAL) BY MOUTH EVERY 7 (SEVEN) DAYS.   acetaminophen (TYLENOL) 500 MG tablet Take 1,000 mg by mouth every 8 (eight) hours as needed for mild pain or headache. (Patient not taking: Reported on 01/30/2021)   [DISCONTINUED] fluconazole (DIFLUCAN) 150 MG tablet Take 1 tablet (150 mg total) by mouth every three (3) days as needed. (Patient not taking: No sig reported)   [DISCONTINUED] metoprolol (LOPRESSOR) 50 MG tablet TAKE 1 TABLET (50 MG  TOTAL) BY MOUTH 2 (TWO) TIMES DAILY.   [DISCONTINUED] PLENVU 140 g SOLR Take 1 kit by mouth as directed. (Patient not taking: Reported on 11/09/2020)   No facility-administered encounter medications on file as of 01/30/2021.    Allergies (verified) Ace inhibitors, Ciprocin-fluocin-procin [fluocinolone], Norvasc [amlodipine], Latex, Neosporin [neomycin-bacitracin zn-polymyx], and Sulfa antibiotics   History: Past Medical History:  Diagnosis Date   Cataract    Endometrial adenocarcinoma ([place])    Environmental and seasonal allergies    Fall 07/2019   Family history of ovarian cancer    Family history of pancreatic cancer    Fx anat neck humerus-open 07/2019   Genetic testing 09/19/2017   Multi-Cancer panel (83 genes) @ [facility] - No pathogenic mutations detected   GERD (gastroesophageal reflux disease)    History of DVT of lower extremity    early 2000s--- lower right leg   History of radiation therapy 09/18/2017-10/13/2017   vaginal brachytherapy (HDR) - endometrial      Dr Gery Pray   Hyperlipidemia    Hypertension    Hypothyroidism    Type 2 diabetes mellitus ([place])    followed by pcp   Umbilical hernia    Vitamin B 12 deficiency    Vitamin D deficiency    Wears glasses    Past Surgical History:  Procedure Laterality Date   BREAST BIOPSY Left 2009   benign   CATARACT EXTRACTION W/ INTRAOCULAR LENS IMPLANT Left 2018   CATARACT EXTRACTION W/PHACO Right 03/14/2014   Procedure: CATARACT EXTRACTION PHACO AND INTRAOCULAR LENS PLACEMENT ([facility]);  Surgeon: Tonny Branch, MD;  Location: [facility];  Service: Ophthalmology;  Laterality: Right;  CDE: 8.34   COLONOSCOPY WITH PROPOFOL N/A 08/11/2020   Procedure: COLONOSCOPY WITH PROPOFOL;  Surgeon: Harvel Quale, MD;  Location: [facility];  Service: Gastroenterology;  Laterality: N/A;  7:30   FOOT GANGLION EXCISION Right 2013   HYSTEROSCOPY WITH D & C N/A 06/13/2017   Procedure: DILATATION AND CURETTAGE /HYSTEROSCOPY;  Surgeon:  Alden Hipp, MD;  Location: [facility];  Service: Gynecology;  Laterality: N/A;   IR FLUORO GUIDE PORT INSERTION RIGHT  08/27/2017   IR REMOVAL TUN ACCESS W/ PORT W/O FL MOD SED  04/27/2018   IR US GUIDE VASC ACCESS RIGHT  08/27/2017   LAPAROSCOPIC CHOLECYSTECTOMY  2005   POLYPECTOMY  08/11/2020   Procedure: POLYPECTOMY INTESTINAL;  Surgeon: Harvel Quale, MD;  [facility]: [facility];  Service: Gastroenterology;;   ROBOTIC ASSISTED TOTAL HYSTERECTOMY WITH BILATERAL SALPINGO OOPHERECTOMY Bilateral 08/05/2017   Procedure: ROBOTIC ASSISTED TOTAL HYSTERECTOMY WITH BILATERAL SALPINGO OOPHORECTOMY;  Surgeon: Everitt Amber, MD;  Location: [facility];  Service: Gynecology;  Laterality: Bilateral;   SENTINEL NODE BIOPSY Bilateral 08/05/2017   Procedure: SENTINEL NODE BIOPSY;  Surgeon: Everitt Amber, MD;  Location: [facility];  Service: Gynecology;  Laterality: Bilateral;   UMBILICAL HERNIA REPAIR N/A 08/05/2017   Procedure: HERNIA REPAIR UMBILICAL ADULT;  Surgeon: Everitt Amber, MD;  Location: [facility];  Service: Gynecology;  Laterality: N/A;   VEIN SURGERY Right 2011   vein stripping-leg  and laser treatment   Family History  Problem Relation Age of Onset   Pancreatic cancer Mother 77       deceased 77   Ovarian cancer Mother        dx 40s   Cancer Other 60       maternal half-sister; unk. type   Social History   Socioeconomic History   Marital status: Widowed    Spouse name: Johnny   Number of children: 4   Years of education: Not on file   Highest education level: Some college, no degree  Occupational History   Occupation: retired  Tobacco Use   Smoking status: Never   Smokeless tobacco: Never  Vaping Use   Vaping Use: Never used  Substance and Sexual Activity   Alcohol use: No   Drug use: No   Sexual activity: Yes    Birth control/protection: Post-menopausal  Other Topics Concern   Not on file  Social History Narrative   Her husband passed 05/2020 - her daughter, Lisa lives with her    Social Determinants of Health   Financial Resource Strain: Low Risk    Difficulty of Paying Living Expenses: Not very hard  Food Insecurity: No Food Insecurity   Worried About Running Out of Food in the Last Year: Never true   Ran Out of Food in the Last Year: Never true  Transportation Needs: No Transportation Needs   Lack of Transportation (Medical): No   Lack of Transportation (Non-Medical): No  Physical Activity: Inactive   Days of Exercise per Week: 0 days   Minutes of Exercise per Session: 0 min  Stress: Stress Concern Present   Feeling of Stress : To some extent  Social Connections: Moderately Isolated   Frequency of Communication with Friends and Family: More than three times a week   Frequency of Social Gatherings with Friends and Family: More than three times a week   Attends Religious Services: 1 to 4 times per year   Active Member of Clubs or Organizations: No   Attends Club or Organization Meetings: Never   Marital Status: Widowed    Tobacco Counseling Counseling given: Not Answered   Clinical Intake:  Pre-visit preparation completed: Yes  Pain : No/denies pain     BMI - recorded: 31.18 Nutritional Status: BMI > 30  Obese Nutritional Risks: None Diabetes: Yes CBG done?: No Did pt. bring in CBG monitor from home?: No  How often do you need to have someone help you when you read instructions, pamphlets, or other written materials from your doctor or pharmacy?: 1 - Never  Nutrition Risk Assessment:  Has the patient had any N/V/D within the last 2 months?  No  Does the patient have any non-healing wounds?  No  Has the patient had any unintentional weight loss or weight gain?  No   Diabetes:  Is the patient diabetic?  Yes  If diabetic, was a CBG obtained today?  No  Did the patient bring in their glucometer from home?  No  How often do you monitor your CBG's? Once daily fasting - 180 this am per patient - she has been snacking on sweets recently.    Financial Strains and Diabetes Management:  Are you having any financial strains with the device, your supplies or your medication? No .  Does the patient want to be seen by Chronic Care Management for management of their diabetes?  No    Would the patient like to be referred to a Nutritionist or for Diabetic Management?  No   Diabetic Exams:  Diabetic Eye Exam: Completed 05/16/2020.   Diabetic Foot Exam: Completed 04/11/2020. Pt has been advised about the importance in completing this exam. Pt is scheduled for diabetic foot exam on 04/30/2021.    Interpreter Needed?: No  Information entered by :: Amy Hopkins, LPN   Activities of Daily Living In your present state of health, do you have any difficulty performing the following activities: 01/30/2021 08/09/2020  Hearing? Y N  Comment a little -  Vision? N N  Difficulty concentrating or making decisions? Y N  Walking or climbing stairs? N N  Dressing or bathing? N N  Doing errands, shopping? N N  Preparing Food and eating ? N -  Using the Toilet? N -  In the past six months, have you accidently leaked urine? Y -  Comment a little if she waits too long -  Do you have problems with loss of bowel control? N -  Managing your Medications? N -  Managing your Finances? N -  Housekeeping or managing your Housekeeping? N -  Some recent data might be hidden    Patient Care Team: Hawks, Christy A, FNP as PCP - General (Family Medicine)  Indicate any recent Medical Services you may have received from other than [facility] providers in the past year (date may be approximate).     Assessment:   This is a routine wellness examination for Shravya.  Hearing/Vision screen Hearing Screening - Comments:: C/o mild hearing difficulties - declines hearing aid Vision Screening - Comments:: Wears eyeglasses - up to date with annual eye exams with Dr Le in [place]  Dietary issues and exercise activities discussed: Current Exercise Habits: The patient  does not participate in regular exercise at present, Exercise limited by: neurologic condition(s);orthopedic condition(s)   Goals Addressed             This Visit's Progress    Exercise 3x per week (30 min per time)   Not on track    Try to exercise for at least 30 minutes, 3 times weekly       Depression Screen PHQ 2/9 Scores 01/30/2021 10/27/2020 04/11/2020 03/13/2020 01/28/2020 01/10/2020 09/14/2019  PHQ - 2 Score 1 0 0 0 0 0 0    Fall Risk Fall Risk  01/30/2021 10/27/2020 04/11/2020 03/13/2020 01/28/2020  Falls in the past year? 0 1 1 1 1  Number falls in past yr: 0 0 1 0 0  Injury with Fall? 0 1 1 1 1  Comment - - - - shoulder injury  Risk for fall due to : History of fall(s);Impaired balance/gait;Orthopedic patient;Impaired vision History of fall(s) History of fall(s);Other (Comment) Impaired balance/gait History of fall(s)  Risk for fall due to: Comment - - Gets dizzy - -  Follow up Education provided Education provided Education provided Falls evaluation completed Falls evaluation completed    FALL RISK PREVENTION PERTAINING TO THE HOME:  Any stairs in or around the home? No  If so, are there any without handrails? No  Home free of loose throw rugs in walkways, pet beds, electrical cords, etc? Yes  Adequate lighting in your home to reduce risk of falls? Yes   ASSISTIVE DEVICES UTILIZED TO PREVENT FALLS:  Life alert? No  Use of a cane, walker or w/c? No  Grab bars in the bathroom? Yes  Shower chair or bench in shower? Yes  Elevated toilet seat   or a handicapped toilet? Yes   TIMED UP AND GO:  Was the test performed? No . Telephonic visit   Cognitive Function:     6CIT Screen 01/30/2021 01/28/2020 12/18/2018  What Year? 0 points 0 points 0 points  What month? 0 points 0 points 0 points  What time? 0 points 0 points 0 points  Count back from 20 0 points 0 points 0 points  Months in reverse 0 points 0 points 0 points  Repeat phrase 0 points 0 points 0 points  Total  Score 0 0 0    Immunizations Immunization History  Administered Date(s) Administered   Influenza Whole 08/02/2011   Influenza,inj,Quad PF,6+ Mos 05/04/2013, 04/23/2014, 04/17/2015   Influenza-Unspecified 01/30/2016, 04/20/2019   PFIZER(Purple Top)SARS-COV-2 Vaccination 08/07/2019, 09/02/2019, 05/12/2020, 10/29/2020   Pneumococcal Conjugate-13 04/22/2017   Pneumococcal Polysaccharide-23 06/01/2018   Tdap 12/12/2018    TDAP status: Up to date  Flu Vaccine status: Due, Education has been provided regarding the importance of this vaccine. Advised may receive this vaccine at local pharmacy or Health Dept. Aware to provide a copy of the vaccination record if obtained from local pharmacy or Health Dept. Verbalized acceptance and understanding.  Pneumococcal vaccine status: Up to date  Covid-19 vaccine status: Completed vaccines  Qualifies for Shingles Vaccine? Yes   Zostavax completed No   Shingrix Completed?: No.    Education has been provided regarding the importance of this vaccine. Patient has been advised to call insurance company to determine out of pocket expense if they have not yet received this vaccine. Advised may also receive vaccine at local pharmacy or Health Dept. Verbalized acceptance and understanding.  Screening Tests Health Maintenance  Topic Date Due   Zoster Vaccines- Shingrix (1 of 2) Never done   INFLUENZA VACCINE  01/29/2021   COVID-19 Vaccine (5 - Booster for Pfizer series) 03/01/2021   FOOT EXAM  04/11/2021   HEMOGLOBIN A1C  04/28/2021   OPHTHALMOLOGY EXAM  05/16/2021   TETANUS/TDAP  12/11/2028   DEXA SCAN  Completed   Hepatitis C Screening  Completed   PNA vac Low Risk Adult  Completed   HPV VACCINES  Aged Out    Health Maintenance  Health Maintenance Due  Topic Date Due   Zoster Vaccines- Shingrix (1 of 2) Never done   INFLUENZA VACCINE  01/29/2021    Colorectal cancer screening: No longer required.   Mammogram status: No longer required due  to patient declines/ age.  Bone Density status: Completed 03/02/2015. Results reflect: Bone density results: OSTEOPENIA. Repeat every 2 years. Declines further testing  Lung Cancer Screening: (Low Dose CT Chest recommended if Age 55-80 years, 30 pack-year currently smoking OR have quit w/in 15years.) does not qualify.   Additional Screening:  Hepatitis C Screening: does qualify; Completed 03/02/2015  Vision Screening: Recommended annual ophthalmology exams for early detection of glaucoma and other disorders of the eye. Is the patient up to date with their annual eye exam?  Yes  Who is the provider or what is the name of the office in which the patient attends annual eye exams? [facility] [place] If pt is not established with a provider, would they like to be referred to a provider to establish care? No .   Dental Screening: Recommended annual dental exams for proper oral hygiene  Community Resource Referral / Chronic Care Management: CRR required this visit?  No   CCM required this visit?  No      Plan:     I   have personally reviewed and noted the following in the patient's chart:   Medical and social history Use of alcohol, tobacco or illicit drugs  Current medications and supplements including opioid prescriptions.  Functional ability and status Nutritional status Physical activity Advanced directives List of other physicians Hospitalizations, surgeries, and ER visits in previous 12 months Vitals Screenings to include cognitive, depression, and falls Referrals and appointments  In addition, I have reviewed and discussed with patient certain preventive protocols, quality metrics, and best practice recommendations. A written personalized care plan for preventive services as well as general preventive health recommendations were provided to patient.     Sandrea Hammond, LPN   4/0/9811   Nurse Notes: None   I have reviewed and agree with the above  documentation.    Evelina Dun, FNP

## 2021-01-30 NOTE — Patient Instructions (Signed)
Alexandria Irwin , Thank you for taking time to come for your Medicare Wellness Visit. I appreciate your ongoing commitment to your health goals. Please review the following plan we discussed and let me know if I can assist you in the future.   Screening recommendations/referrals: Colonoscopy: Done 08/11/2020 - repeat not required Mammogram: Done 01/29/2013 - Recommend repeat every year - patient declined Bone Density: Done 03/02/2015 - Recommend repeat every 2 years Recommended yearly ophthalmology/optometry visit for glaucoma screening and checkup Recommended yearly dental visit for hygiene and checkup  Vaccinations: Influenza vaccine: Due every fall Pneumococcal vaccine: Done 04/22/2017 & 06/01/2018 Tdap vaccine: Done 12/12/2018 - Repeat in 10 years Shingles vaccine: Due. Shingrix discussed. Please contact your pharmacy for coverage information.     Covid-19:Done 08/07/2019, 09/02/2019, 05/12/2020, & ~10/29/2020  Advanced directives: Advance directive discussed with you today. Even though you declined this today, please call our office should you change your mind, and we can give you the proper paperwork for you to fill out.   Conditions/risks identified: Aim for 30 minutes of exercise or brisk walking each day, drink 6-8 glasses of water and eat lots of fruits and vegetables.   Next appointment: Follow up in one year for your annual wellness visit    Preventive Care 65 Years and Older, Female Preventive care refers to lifestyle choices and visits with your health care provider that can promote health and wellness. What does preventive care include? A yearly physical exam. This is also called an annual well check. Dental exams once or twice a year. Routine eye exams. Ask your health care provider how often you should have your eyes checked. Personal lifestyle choices, including: Daily care of your teeth and gums. Regular physical activity. Eating a healthy diet. Avoiding tobacco and drug  use. Limiting alcohol use. Practicing safe sex. Taking low-dose aspirin every day. Taking vitamin and mineral supplements as recommended by your health care provider. What happens during an annual well check? The services and screenings done by your health care provider during your annual well check will depend on your age, overall health, lifestyle risk factors, and family history of disease. Counseling  Your health care provider may ask you questions about your: Alcohol use. Tobacco use. Drug use. Emotional well-being. Home and relationship well-being. Sexual activity. Eating habits. History of falls. Memory and ability to understand (cognition). Work and work Statistician. Reproductive health. Screening  You may have the following tests or measurements: Height, weight, and BMI. Blood pressure. Lipid and cholesterol levels. These may be checked every 5 years, or more frequently if you are over 40 years old. Skin check. Lung cancer screening. You may have this screening every year starting at age 49 if you have a 30-pack-year history of smoking and currently smoke or have quit within the past 15 years. Fecal occult blood test (FOBT) of the stool. You may have this test every year starting at age 62. Flexible sigmoidoscopy or colonoscopy. You may have a sigmoidoscopy every 5 years or a colonoscopy every 10 years starting at age 56. Hepatitis C blood test. Hepatitis B blood test. Sexually transmitted disease (STD) testing. Diabetes screening. This is done by checking your blood sugar (glucose) after you have not eaten for a while (fasting). You may have this done every 1-3 years. Bone density scan. This is done to screen for osteoporosis. You may have this done starting at age 34. Mammogram. This may be done every 1-2 years. Talk to your health care provider about how often  you should have regular mammograms. Talk with your health care provider about your test results, treatment  options, and if necessary, the need for more tests. Vaccines  Your health care provider may recommend certain vaccines, such as: Influenza vaccine. This is recommended every year. Tetanus, diphtheria, and acellular pertussis (Tdap, Td) vaccine. You may need a Td booster every 10 years. Zoster vaccine. You may need this after age 62. Pneumococcal 13-valent conjugate (PCV13) vaccine. One dose is recommended after age 19. Pneumococcal polysaccharide (PPSV23) vaccine. One dose is recommended after age 47. Talk to your health care provider about which screenings and vaccines you need and how often you need them. This information is not intended to replace advice given to you by your health care provider. Make sure you discuss any questions you have with your health care provider. Document Released: 07/14/2015 Document Revised: 03/06/2016 Document Reviewed: 04/18/2015 Elsevier Interactive Patient Education  2017 [place] Prevention in the Home Falls can cause injuries. They can happen to people of all ages. There are many things you can do to make your home safe and to help prevent falls. What can I do on the outside of my home? Regularly fix the edges of walkways and driveways and fix any cracks. Remove anything that might make you trip as you walk through a door, such as a raised step or threshold. Trim any bushes or trees on the path to your home. Use bright outdoor lighting. Clear any walking paths of anything that might make someone trip, such as rocks or tools. Regularly check to see if handrails are loose or broken. Make sure that both sides of any steps have handrails. Any raised decks and porches should have guardrails on the edges. Have any leaves, snow, or ice cleared regularly. Use sand or salt on walking paths during winter. Clean up any spills in your garage right away. This includes oil or grease spills. What can I do in the bathroom? Use night lights. Install grab  bars by the toilet and in the tub and shower. Do not use towel bars as grab bars. Use non-skid mats or decals in the tub or shower. If you need to sit down in the shower, use a plastic, non-slip stool. Keep the floor dry. Clean up any water that spills on the floor as soon as it happens. Remove soap buildup in the tub or shower regularly. Attach bath mats securely with double-sided non-slip rug tape. Do not have throw rugs and other things on the floor that can make you trip. What can I do in the bedroom? Use night lights. Make sure that you have a light by your bed that is easy to reach. Do not use any sheets or blankets that are too big for your bed. They should not hang down onto the floor. Have a firm chair that has side arms. You can use this for support while you get dressed. Do not have throw rugs and other things on the floor that can make you trip. What can I do in the kitchen? Clean up any spills right away. Avoid walking on wet floors. Keep items that you use a lot in easy-to-reach places. If you need to reach something above you, use a strong step stool that has a grab bar. Keep electrical cords out of the way. Do not use floor polish or wax that makes floors slippery. If you must use wax, use non-skid floor wax. Do not have throw rugs and other things on  the floor that can make you trip. What can I do with my stairs? Do not leave any items on the stairs. Make sure that there are handrails on both sides of the stairs and use them. Fix handrails that are broken or loose. Make sure that handrails are as long as the stairways. Check any carpeting to make sure that it is firmly attached to the stairs. Fix any carpet that is loose or worn. Avoid having throw rugs at the top or bottom of the stairs. If you do have throw rugs, attach them to the floor with carpet tape. Make sure that you have a light switch at the top of the stairs and the bottom of the stairs. If you do not have them,  ask someone to add them for you. What else can I do to help prevent falls? Wear shoes that: Do not have high heels. Have rubber bottoms. Are comfortable and fit you well. Are closed at the toe. Do not wear sandals. If you use a stepladder: Make sure that it is fully opened. Do not climb a closed stepladder. Make sure that both sides of the stepladder are locked into place. Ask someone to hold it for you, if possible. Clearly mark and make sure that you can see: Any grab bars or handrails. First and last steps. Where the edge of each step is. Use tools that help you move around (mobility aids) if they are needed. These include: Canes. Walkers. Scooters. Crutches. Turn on the lights when you go into a dark area. Replace any light bulbs as soon as they burn out. Set up your furniture so you have a clear path. Avoid moving your furniture around. If any of your floors are uneven, fix them. If there are any pets around you, be aware of where they are. Review your medicines with your doctor. Some medicines can make you feel dizzy. This can increase your chance of falling. Ask your doctor what other things that you can do to help prevent falls. This information is not intended to replace advice given to you by your health care provider. Make sure you discuss any questions you have with your health care provider. Document Released: 04/13/2009 Document Revised: 11/23/2015 Document Reviewed: 07/22/2014 Elsevier Interactive Patient Education  2017 [employer].

## 2021-02-01 ENCOUNTER — Telehealth: Payer: Self-pay | Admitting: *Deleted

## 2021-02-01 NOTE — Telephone Encounter (Signed)
CALLED PATIENT TO INFORM OF FU WITH DR. Berline Lopes ON 05-11-21- ARRIVAL TIME- 12:45 PM, SPOKE WITH PATIENT AND SHE IS AWARE OF THIS APPT.

## 2021-02-16 ENCOUNTER — Other Ambulatory Visit: Payer: Self-pay | Admitting: Family

## 2021-02-16 DIAGNOSIS — E1169 Type 2 diabetes mellitus with other specified complication: Secondary | ICD-10-CM

## 2021-02-25 ENCOUNTER — Other Ambulatory Visit: Payer: Self-pay

## 2021-02-25 DIAGNOSIS — E559 Vitamin D deficiency, unspecified: Secondary | ICD-10-CM

## 2021-02-26 MED ORDER — VITAMIN D (ERGOCALCIFEROL) 1.25 MG (50000 UNIT) PO CAPS: 50000.0000 [IU] | ORAL_CAPSULE | ORAL | 0 refills | Status: DC

## 2021-03-21 ENCOUNTER — Other Ambulatory Visit: Payer: Self-pay | Admitting: Family

## 2021-03-21 DIAGNOSIS — R42 Dizziness and giddiness: Secondary | ICD-10-CM

## 2021-03-22 DIAGNOSIS — M79676 Pain in unspecified toe(s): Secondary | ICD-10-CM | POA: Diagnosis not present

## 2021-03-22 DIAGNOSIS — E1142 Type 2 diabetes mellitus with diabetic polyneuropathy: Secondary | ICD-10-CM | POA: Diagnosis not present

## 2021-03-22 DIAGNOSIS — B351 Tinea unguium: Secondary | ICD-10-CM | POA: Diagnosis not present

## 2021-03-22 DIAGNOSIS — L84 Corns and callosities: Secondary | ICD-10-CM | POA: Diagnosis not present

## 2021-04-11 ENCOUNTER — Telehealth: Payer: Self-pay | Admitting: *Deleted

## 2021-04-11 NOTE — Telephone Encounter (Signed)
Moved appt from 11/11 to 11/9

## 2021-04-20 ENCOUNTER — Other Ambulatory Visit: Payer: Self-pay | Admitting: Family

## 2021-04-21 ENCOUNTER — Other Ambulatory Visit: Payer: Self-pay | Admitting: Family

## 2021-04-21 DIAGNOSIS — E1169 Type 2 diabetes mellitus with other specified complication: Secondary | ICD-10-CM

## 2021-04-24 ENCOUNTER — Other Ambulatory Visit: Payer: Self-pay | Admitting: Family

## 2021-04-27 ENCOUNTER — Ambulatory Visit: Payer: Medicare Other | Admitting: Family

## 2021-04-30 ENCOUNTER — Encounter: Payer: Self-pay | Admitting: Family

## 2021-04-30 ENCOUNTER — Ambulatory Visit (INDEPENDENT_AMBULATORY_CARE_PROVIDER_SITE_OTHER): Payer: Medicare Other | Admitting: Family

## 2021-04-30 ENCOUNTER — Other Ambulatory Visit: Payer: Self-pay

## 2021-04-30 VITALS — BP 121/66 | HR 59 | Temp 98.1°F | Resp 20 | Ht 61.0 in | Wt 166.0 lb

## 2021-04-30 DIAGNOSIS — K760 Fatty (change of) liver, not elsewhere classified: Secondary | ICD-10-CM

## 2021-04-30 DIAGNOSIS — E119 Type 2 diabetes mellitus without complications: Secondary | ICD-10-CM

## 2021-04-30 DIAGNOSIS — E785 Hyperlipidemia, unspecified: Secondary | ICD-10-CM | POA: Diagnosis not present

## 2021-04-30 DIAGNOSIS — E559 Vitamin D deficiency, unspecified: Secondary | ICD-10-CM | POA: Diagnosis not present

## 2021-04-30 DIAGNOSIS — Z23 Encounter for immunization: Secondary | ICD-10-CM

## 2021-04-30 DIAGNOSIS — R300 Dysuria: Secondary | ICD-10-CM

## 2021-04-30 DIAGNOSIS — K7460 Unspecified cirrhosis of liver: Secondary | ICD-10-CM | POA: Diagnosis not present

## 2021-04-30 DIAGNOSIS — E669 Obesity, unspecified: Secondary | ICD-10-CM

## 2021-04-30 DIAGNOSIS — E1169 Type 2 diabetes mellitus with other specified complication: Secondary | ICD-10-CM | POA: Diagnosis not present

## 2021-04-30 DIAGNOSIS — I152 Hypertension secondary to endocrine disorders: Secondary | ICD-10-CM

## 2021-04-30 DIAGNOSIS — E1159 Type 2 diabetes mellitus with other circulatory complications: Secondary | ICD-10-CM | POA: Diagnosis not present

## 2021-04-30 DIAGNOSIS — C541 Malignant neoplasm of endometrium: Secondary | ICD-10-CM

## 2021-04-30 DIAGNOSIS — G620 Drug-induced polyneuropathy: Secondary | ICD-10-CM | POA: Diagnosis not present

## 2021-04-30 DIAGNOSIS — E039 Hypothyroidism, unspecified: Secondary | ICD-10-CM

## 2021-04-30 DIAGNOSIS — T451X5A Adverse effect of antineoplastic and immunosuppressive drugs, initial encounter: Secondary | ICD-10-CM

## 2021-04-30 LAB — URINALYSIS, COMPLETE
Bilirubin, UA: NEGATIVE
Ketones, UA: NEGATIVE
Leukocytes,UA: NEGATIVE
Nitrite, UA: NEGATIVE
Protein,UA: NEGATIVE
RBC, UA: NEGATIVE
Specific Gravity, UA: 1.02 (ref 1.005–1.030)
Urobilinogen, Ur: 0.2 mg/dL (ref 0.2–1.0)
pH, UA: 5 (ref 5.0–7.5)

## 2021-04-30 LAB — MICROSCOPIC EXAMINATION
Bacteria, UA: NONE SEEN
RBC, Urine: NONE SEEN /hpf (ref 0–2)

## 2021-04-30 LAB — BAYER DCA HB A1C WAIVED: HB A1C (BAYER DCA - WAIVED): 7.3 % — ABNORMAL HIGH (ref 4.8–5.6)

## 2021-04-30 NOTE — Progress Notes (Signed)
Subjective:    Patient ID: Alexandria Irwin, female    DOB: December 20, 1945, 75 y.o.   MRN: [id number]  Chief Complaint  Patient presents with   Medical Management of Chronic Issues    6 mo    Pt presents to the office today for chronic follow up. 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 She has completed chemo and radiation. 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  Hypertension This is a chronic problem. The current episode started more than 1 year ago. The problem has been resolved since onset. The problem is controlled. Pertinent negatives include no blurred vision, malaise/fatigue, peripheral edema or shortness of breath. Risk factors for coronary artery disease include dyslipidemia, diabetes mellitus, obesity and sedentary lifestyle. The current treatment provides moderate improvement. Identifiable causes of hypertension include a thyroid problem.  Thyroid Problem Presents for follow-up visit. Symptoms include fatigue. Patient reports no constipation, depressed mood or diaphoresis. The symptoms have been stable. Her past medical history is significant for hyperlipidemia.  Hyperlipidemia This is a chronic problem. The current episode started more than 1 year ago. The problem is controlled. Exacerbating diseases include obesity. Pertinent negatives include no shortness of breath. Current antihyperlipidemic treatment includes statins. The current treatment provides moderate improvement of lipids. Risk factors for coronary artery disease include dyslipidemia, hypertension and a sedentary lifestyle.  Dysuria  This is a new problem. The current episode started in the past 7 days. The problem occurs intermittently. The problem has been waxing and waning. The quality of the pain is described as burning. The pain is at a severity of 4/10. Associated symptoms include frequency and urgency. Pertinent negatives include no  hematuria, nausea or vomiting. She has tried increased fluids for the symptoms. The treatment provided mild relief.  Diabetes She presents for her follow-up diabetic visit. She has type 2 diabetes mellitus. There are no hypoglycemic associated symptoms. Associated symptoms include fatigue and foot paresthesias. Pertinent negatives for diabetes include no blurred vision. Symptoms are stable. Diabetic complications include peripheral neuropathy. Pertinent negatives for diabetic complications include no nephropathy. Risk factors for coronary artery disease include dyslipidemia, diabetes mellitus, hypertension, sedentary lifestyle and post-menopausal. She is following a generally unhealthy diet. Her overall blood glucose range is 140-180 mg/dl. An ACE inhibitor/angiotensin II receptor blocker is being taken. Eye exam is not current.     Review of Systems  Constitutional:  Positive for fatigue. Negative for diaphoresis and malaise/fatigue.  Eyes:  Negative for blurred vision.  Respiratory:  Negative for shortness of breath.   Gastrointestinal:  Negative for constipation, nausea and vomiting.  Genitourinary:  Positive for dysuria, frequency and urgency. Negative for hematuria.  All other systems reviewed and are negative.     Objective:   Physical Exam Vitals reviewed.  Constitutional:      General: She is not in acute distress.    Appearance: She is well-developed. She is obese.  HENT:     Head: Normocephalic and atraumatic.     Right Ear: Tympanic membrane normal.     Left Ear: Tympanic membrane normal.  Eyes:     Pupils: Pupils are equal, round, and reactive to light.  Neck:     Thyroid: No thyromegaly.  Cardiovascular:     Rate and Rhythm: Normal rate and regular rhythm.     Heart sounds: Normal heart sounds. No murmur heard. Pulmonary:     Effort: Pulmonary effort is normal. No respiratory distress.  Breath sounds: Normal breath sounds. No wheezing.  Abdominal:     General:  Bowel sounds are normal. There is no distension.     Palpations: Abdomen is soft.     Tenderness: There is no abdominal tenderness.  Musculoskeletal:        General: No tenderness. Normal range of motion.     Cervical back: Normal range of motion and neck supple.     Right lower leg: Edema (trace) present.     Left lower leg: Edema (trace) present.  Skin:    General: Skin is warm and dry.  Neurological:     Mental Status: She is alert and oriented to person, place, and time.     Cranial Nerves: No cranial nerve deficit.     Deep Tendon Reflexes: Reflexes are normal and symmetric.  Psychiatric:        Behavior: Behavior normal.        Thought Content: Thought content normal.        Judgment: Judgment normal.    Diabetic Foot Exam - Simple   Simple Foot Form Diabetic Foot exam was performed with the following findings: Yes 04/30/2021 10:32 AM  Visual Inspection No deformities, no ulcerations, no other skin breakdown bilaterally: Yes Sensation Testing Intact to touch and monofilament testing bilaterally: Yes Pulse Check Posterior Tibialis and Dorsalis pulse intact bilaterally: Yes Comments      BP 121/66   Pulse (!) 59   Temp 98.1 F (36.7 C)   Resp 20   Ht _0  (1.549 m)   Wt 166 lb (75.3 kg)   SpO2 99%   BMI 31.37 kg/m      Assessment & Plan:  Alexandria Irwin comes in today with chief complaint of Medical Management of Chronic Issues (6 mo )   Diagnosis and orders addressed:  1. Type 2 diabetes mellitus without complication, without long-term current use of insulin (HCC) - CBC with Differential/Platelet - Bayer DCA Hb A1c Waived  2. Hypertension associated with diabetes ([facility]) - CBC with Differential/Platelet - CMP14+EGFR  3. Hyperlipidemia associated with type 2 diabetes mellitus ([place]) - CBC with Differential/Platelet - Lipid panel  4. Hypothyroidism, unspecified type - CBC with Differential/Platelet - Thyroid Panel With TSH  5. Dysuria - Urine  Culture - Urinalysis, Complete  6. Fatty liver  7. 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 Endometrial adenocarcinoma (H[facility])  9. Obesity (BMI 30-39.9)  10. Vitamin D deficiency  11. Cirrhosis of liver without ascites, unspecified hepatic cirrhosis type ([facility])    Labs pending Health Maintenance reviewed Diet and exercise encouraged  Follow up plan: 3 months    Evelina Dun, FNP

## 2021-04-30 NOTE — Patient Instructions (Signed)
Diabetes Mellitus and Nutrition, Adult When you have diabetes, or diabetes mellitus, it is very important to have healthy eating habits because your blood sugar (glucose) levels are greatly affected by what you eat and drink. Eating healthy foods in the right amounts, at about the same times every day, can help you:  Control your blood glucose.  Lower your risk of heart disease.  Improve your blood pressure.  Reach or maintain a healthy weight. What can affect my meal plan? Every person with diabetes is different, and each person has different needs for a meal plan. Your health care provider may recommend that you work with a dietitian to make a meal plan that is best for you. Your meal plan may vary depending on factors such as:  The calories you need.  The medicines you take.  Your weight.  Your blood glucose, blood pressure, and cholesterol levels.  Your activity level.  Other health conditions you have, such as heart or kidney disease. How do carbohydrates affect me? Carbohydrates, also called carbs, affect your blood glucose level more than any other type of food. Eating carbs naturally raises the amount of glucose in your blood. Carb counting is a method for keeping track of how many carbs you eat. Counting carbs is important to keep your blood glucose at a healthy level, especially if you use insulin or take certain oral diabetes medicines. It is important to know how many carbs you can safely have in each meal. This is different for every person. Your dietitian can help you calculate how many carbs you should have at each meal and for each snack. How does alcohol affect me? Alcohol can cause a sudden decrease in blood glucose (hypoglycemia), especially if you use insulin or take certain oral diabetes medicines. Hypoglycemia can be a life-threatening condition. Symptoms of hypoglycemia, such as sleepiness, dizziness, and confusion, are similar to symptoms of having too much  alcohol.  Do not drink alcohol if: ? Your health care provider tells you not to drink. ? You are pregnant, may be pregnant, or are planning to become pregnant.  If you drink alcohol: ? Do not drink on an empty stomach. ? Limit how much you use to:  0-1 drink a day for women.  0-2 drinks a day for men. ? Be aware of how much alcohol is in your drink. In the [place] one drink equals one 12 oz bottle of beer (355 mL), one 5 oz glass of wine (148 mL), or one 1 oz glass of hard liquor (44 mL). ? Keep yourself hydrated with water, diet soda, or unsweetened iced tea.  Keep in mind that regular soda, juice, and other mixers may contain a lot of sugar and must be counted as carbs. What are tips for following this plan? Reading food labels  Start by checking the serving size on the "Nutrition Facts" label of packaged foods and drinks. The amount of calories, carbs, fats, and other nutrients listed on the label is based on one serving of the item. Many items contain more than one serving per package.  Check the total grams (g) of carbs in one serving. You can calculate the number of servings of carbs in one serving by dividing the total carbs by 15. For example, if a food has 30 g of total carbs per serving, it would be equal to 2 servings of carbs.  Check the number of grams (g) of saturated fats and trans fats in one serving. Choose foods that have   a low amount or none of these fats.  Check the number of milligrams (mg) of salt (sodium) in one serving. Most people should limit total sodium intake to less than 2,300 mg per day.  Always check the nutrition information of foods labeled as "low-fat" or "nonfat." These foods may be higher in added sugar or refined carbs and should be avoided.  Talk to your dietitian to identify your daily goals for nutrients listed on the label. Shopping  Avoid buying canned, pre-made, or processed foods. These foods tend to be high in fat, sodium, and added  sugar.  Shop around the outside edge of the grocery store. This is where you will most often find fresh fruits and vegetables, bulk grains, fresh meats, and fresh dairy. Cooking  Use low-heat cooking methods, such as baking, instead of high-heat cooking methods like deep frying.  Cook using healthy oils, such as olive, canola, or sunflower oil.  Avoid cooking with butter, cream, or high-fat meats. Meal planning  Eat meals and snacks regularly, preferably at the same times every day. Avoid going long periods of time without eating.  Eat foods that are high in fiber, such as fresh fruits, vegetables, beans, and whole grains. Talk with your dietitian about how many servings of carbs you can eat at each meal.  Eat 4-6 oz (112-168 g) of lean protein each day, such as lean meat, chicken, fish, eggs, or tofu. One ounce (oz) of lean protein is equal to: ? 1 oz (28 g) of meat, chicken, or fish. ? 1 egg. ?  cup (62 g) of tofu.  Eat some foods each day that contain healthy fats, such as avocado, nuts, seeds, and fish.   What foods should I eat? Fruits Berries. Apples. Oranges. Peaches. Apricots. Plums. Grapes. Mango. Papaya. Pomegranate. Kiwi. Cherries. Vegetables Lettuce. Spinach. Leafy greens, including kale, chard, collard greens, and mustard greens. Beets. Cauliflower. Cabbage. Broccoli. Carrots. Green beans. Tomatoes. Peppers. Onions. Cucumbers. Brussels sprouts. Grains Whole grains, such as whole-wheat or whole-grain bread, crackers, tortillas, cereal, and pasta. Unsweetened oatmeal. Quinoa. Brown or wild rice. Meats and other proteins Seafood. Poultry without skin. Lean cuts of poultry and beef. Tofu. Nuts. Seeds. Dairy Low-fat or fat-free dairy products such as milk, yogurt, and cheese. The items listed above may not be a complete list of foods and beverages you can eat. Contact a dietitian for more information. What foods should I avoid? Fruits Fruits canned with  syrup. Vegetables Canned vegetables. Frozen vegetables with butter or cream sauce. Grains Refined white flour and flour products such as bread, pasta, snack foods, and cereals. Avoid all processed foods. Meats and other proteins Fatty cuts of meat. Poultry with skin. Breaded or fried meats. Processed meat. Avoid saturated fats. Dairy Full-fat yogurt, cheese, or milk. Beverages Sweetened drinks, such as soda or iced tea. The items listed above may not be a complete list of foods and beverages you should avoid. Contact a dietitian for more information. Questions to ask a health care provider  Do I need to meet with a diabetes educator?  Do I need to meet with a dietitian?  What number can I call if I have questions?  When are the best times to check my blood glucose? Where to find more information:  American Diabetes Association: diabetes.org  Academy of Nutrition and Dietetics: www.eatright.org  National Institute of Diabetes and Digestive and Kidney Diseases: www.niddk.nih.gov  Association of Diabetes Care and Education Specialists: www.diabeteseducator.org Summary  It is important to have healthy eating   habits because your blood sugar (glucose) levels are greatly affected by what you eat and drink.  A healthy meal plan will help you control your blood glucose and maintain a healthy lifestyle.  Your health care provider may recommend that you work with a dietitian to make a meal plan that is best for you.  Keep in mind that carbohydrates (carbs) and alcohol have immediate effects on your blood glucose levels. It is important to count carbs and to use alcohol carefully. This information is not intended to replace advice given to you by your health care provider. Make sure you discuss any questions you have with your health care provider. Document Revised: 05/25/2019 Document Reviewed: 05/25/2019 Elsevier Patient Education  2021 [employer]  

## 2021-05-01 LAB — LIPID PANEL
Chol/HDL Ratio: 2.9 ratio (ref 0.0–4.4)
Cholesterol, Total: 136 mg/dL (ref 100–199)
HDL: 47 mg/dL
LDL Chol Calc (NIH): 62 mg/dL (ref 0–99)
Triglycerides: 157 mg/dL — ABNORMAL HIGH (ref 0–149)
VLDL Cholesterol Cal: 27 mg/dL (ref 5–40)

## 2021-05-01 LAB — CBC WITH DIFFERENTIAL/PLATELET
Basophils Absolute: 0 x10E3/uL (ref 0.0–0.2)
Basos: 1 %
EOS (ABSOLUTE): 0.2 x10E3/uL (ref 0.0–0.4)
Eos: 3 %
Hematocrit: 45.3 % (ref 34.0–46.6)
Hemoglobin: 15.4 g/dL (ref 11.1–15.9)
Immature Grans (Abs): 0 x10E3/uL (ref 0.0–0.1)
Immature Granulocytes: 0 %
Lymphocytes Absolute: 2.9 x10E3/uL (ref 0.7–3.1)
Lymphs: 40 %
MCH: 32.5 pg (ref 26.6–33.0)
MCHC: 34 g/dL (ref 31.5–35.7)
MCV: 96 fL (ref 79–97)
Monocytes Absolute: 0.5 x10E3/uL (ref 0.1–0.9)
Monocytes: 7 %
Neutrophils Absolute: 3.5 x10E3/uL (ref 1.4–7.0)
Neutrophils: 49 %
Platelets: 193 x10E3/uL (ref 150–450)
RBC: 4.74 x10E6/uL (ref 3.77–5.28)
RDW: 12.7 % (ref 11.7–15.4)
WBC: 7.1 x10E3/uL (ref 3.4–10.8)

## 2021-05-01 LAB — CMP14+EGFR
ALT: 20 IU/L (ref 0–32)
AST: 22 IU/L (ref 0–40)
Albumin/Globulin Ratio: 2 (ref 1.2–2.2)
Albumin: 4.8 g/dL — ABNORMAL HIGH (ref 3.7–4.7)
Alkaline Phosphatase: 44 IU/L (ref 44–121)
BUN/Creatinine Ratio: 13 (ref 12–28)
BUN: 15 mg/dL (ref 8–27)
Bilirubin Total: 0.9 mg/dL (ref 0.0–1.2)
CO2: 26 mmol/L (ref 20–29)
Calcium: 10.2 mg/dL (ref 8.7–10.3)
Chloride: 100 mmol/L (ref 96–106)
Creatinine, Ser: 1.12 mg/dL — ABNORMAL HIGH (ref 0.57–1.00)
Globulin, Total: 2.4 g/dL (ref 1.5–4.5)
Glucose: 191 mg/dL — ABNORMAL HIGH (ref 70–99)
Potassium: 4.9 mmol/L (ref 3.5–5.2)
Sodium: 141 mmol/L (ref 134–144)
Total Protein: 7.2 g/dL (ref 6.0–8.5)
eGFR: 51 mL/min/{1.73_m2} — ABNORMAL LOW (ref >59)

## 2021-05-01 LAB — THYROID PANEL WITH TSH
Free Thyroxine Index: 2.5 (ref 1.2–4.9)
T3 Uptake Ratio: 28 % (ref 24–39)
T4, Total: 9.1 ug/dL (ref 4.5–12.0)
TSH: 1.73 u[IU]/mL (ref 0.450–4.500)

## 2021-05-05 DIAGNOSIS — Z23 Encounter for immunization: Secondary | ICD-10-CM | POA: Diagnosis not present

## 2021-05-06 DIAGNOSIS — U071 COVID-19: Secondary | ICD-10-CM | POA: Diagnosis not present

## 2021-05-06 LAB — URINE CULTURE

## 2021-05-07 ENCOUNTER — Other Ambulatory Visit: Payer: Self-pay | Admitting: Family

## 2021-05-07 MED ORDER — CEPHALEXIN 500 MG PO CAPS: 500.0000 mg | ORAL_CAPSULE | Freq: Two times a day (BID) | ORAL | 0 refills | Status: DC

## 2021-05-08 ENCOUNTER — Encounter: Payer: Self-pay | Admitting: Gynecologic Oncology

## 2021-05-09 ENCOUNTER — Other Ambulatory Visit: Payer: Self-pay

## 2021-05-09 ENCOUNTER — Encounter: Payer: Self-pay | Admitting: Gynecologic Oncology

## 2021-05-09 ENCOUNTER — Inpatient Hospital Stay: Payer: Medicare Other | Attending: Gynecologic Oncology | Admitting: Gynecologic Oncology

## 2021-05-09 VITALS — BP 144/70 | HR 56 | Temp 98.7°F | Resp 16 | Ht 61.0 in | Wt 167.0 lb

## 2021-05-09 DIAGNOSIS — E1136 Type 2 diabetes mellitus with diabetic cataract: Secondary | ICD-10-CM | POA: Diagnosis not present

## 2021-05-09 DIAGNOSIS — K219 Gastro-esophageal reflux disease without esophagitis: Secondary | ICD-10-CM | POA: Diagnosis not present

## 2021-05-09 DIAGNOSIS — Z8542 Personal history of malignant neoplasm of other parts of uterus: Secondary | ICD-10-CM | POA: Insufficient documentation

## 2021-05-09 DIAGNOSIS — Z90710 Acquired absence of both cervix and uterus: Secondary | ICD-10-CM | POA: Insufficient documentation

## 2021-05-09 DIAGNOSIS — I10 Essential (primary) hypertension: Secondary | ICD-10-CM | POA: Insufficient documentation

## 2021-05-09 DIAGNOSIS — Z79621 Long term (current) use of calcineurin inhibitor: Secondary | ICD-10-CM | POA: Insufficient documentation

## 2021-05-09 DIAGNOSIS — Z86718 Personal history of other venous thrombosis and embolism: Secondary | ICD-10-CM | POA: Insufficient documentation

## 2021-05-09 DIAGNOSIS — Z7982 Long term (current) use of aspirin: Secondary | ICD-10-CM | POA: Insufficient documentation

## 2021-05-09 DIAGNOSIS — E039 Hypothyroidism, unspecified: Secondary | ICD-10-CM | POA: Diagnosis not present

## 2021-05-09 DIAGNOSIS — C541 Malignant neoplasm of endometrium: Secondary | ICD-10-CM

## 2021-05-09 DIAGNOSIS — Z923 Personal history of irradiation: Secondary | ICD-10-CM | POA: Insufficient documentation

## 2021-05-09 DIAGNOSIS — E785 Hyperlipidemia, unspecified: Secondary | ICD-10-CM | POA: Diagnosis not present

## 2021-05-09 DIAGNOSIS — Z90722 Acquired absence of ovaries, bilateral: Secondary | ICD-10-CM | POA: Insufficient documentation

## 2021-05-09 DIAGNOSIS — Z7984 Long term (current) use of oral hypoglycemic drugs: Secondary | ICD-10-CM | POA: Diagnosis not present

## 2021-05-09 DIAGNOSIS — Z9221 Personal history of antineoplastic chemotherapy: Secondary | ICD-10-CM | POA: Insufficient documentation

## 2021-05-09 NOTE — Progress Notes (Signed)
Gynecologic Oncology Return Clinic Visit  05/09/2021  Reason for Visit: Follow-up in the setting of endometrial cancer  Treatment History: Oncology History Overview Note  endometroid MSI by [facility] showed loss of nuclear expression, BRAF neg. Neg genetic testing   Endometrial adenocarcinoma ([place])  06/13/2017 Procedure   PROCEDURE: Hysteroscopy, D&C   SURGEON: Richard D. Deatra Ina M.D.    FINDINGS: Hypertrophic endometrial tissue, no polyps, no myoma      06/13/2017 Pathology Results   Endometrium, curettage ENDOMETRIOID ADENOCARCINOMA, FIGO GRADE 1   08/05/2017 Pathology Results   1. Lymph node, sentinel, biopsy, right external iliac - NO CARCINOMA IDENTIFIED IN ONE LYMPH NODE (0/1) - SEE COMMENT 2. Lymph node, sentinel, biopsy, left external iliac #1 - NO CARCINOMA IDENTIFIED IN ONE LYMPH NODE (0/1) - SEE COMMENT 3. Lymph node, sentinel, biopsy, left external iliac #2 - NO CARCINOMA IDENTIFIED IN ONE LYMPH NODE (0/1) - SEE COMMENT 4. Uterus +/- tubes/ovaries, neoplastic, ovaries UTERUS: - ENDOMETRIOID CARCINOMA, FIGO GRADE 2, INVOLVING THE ENTIRE POSTERIOR MYOMETRIUM WITH TUMOR PRESENT AT INKED SEROSAL SURFACE - SEE ONCOLOGY TABLE BELOW CERVIX: - NABOTHIAN CYSTS - NO DYSPLASIA OR MALIGNANCY IDENTIFIED BILATERAL OVARIES: - NO CARCINOMA IDENTIFIED BILATERAL FALLOPIAN TUBES: - ENDOMETRIOID CARCINOMA INVOLVING LEFT FALLOPIAN TUBE  1. , 2 and 3. Cytokeratin AE1/3 was performed on the sentinel lymph nodes to exclude micrometastasis; they are negative. 4. ONCOLOGY TABLE-UTERUS, CARCINOMA OR CARCINOSARCOMA Specimen: Uterus, bilateral fallopian tubes and ovaries Procedure: Total hysterectomy and bilateral salpingo-oophorectomy Histologic type: Endometrioid carcinoma, NOS Grade: FIGO Grade 2 Myometrial invasion: >50% (The posterior myometrium has full thickness involvement with tumor extending to the inked serosal surface) Uterine Serosa involvement: Present Cervical stromal  involvement: Not identified Other Tissue/Organ Involvement: Left fallopian tube Lymphovascular invasion: Not identified Peritoneal washings: Not submitted/unknown Lymph nodes: Examined: 3 Sentinel 0 Non-sentinel 3 Total Lymph nodes with metastasis: 0 Isolated tumor cells (< 0.2 mm): 0 Micrometastasis: (> 0.2 mm and < 2.0 mm): 0 Macrometastasis: (> 2.0 mm): 0 Extracapsular extension: N/A TNM code: pT3a, pN0 FIGO Stage (based on pathologic findings, needs clinical correlation): IIIa *Block 4I is representative of the tumor and suitable for additional studies, if requested. COMMENT: MMR (by immunohistochemistry) is pending and will be reported in an addendum.   08/05/2017 Surgery   Operation: Robotic-assisted laparoscopic total hysterectomy with bilateral salpingoophorectomy, SLN biopsy, umbilical hernia   Surgeon: Donaciano Eva    Operative Findings:  : 6cm normal appearing uterus, normal right tube and ovary, left ovary with cystic mass, left sigmoid colon diverticular disease, adhesions to left ovary.  No gross extrauterine disease.       08/14/2017 Imaging   1. Interval hysterectomy. No findings of recurrent or metastatic disease. 2. Small volume pelvic fluid and a presumed postoperative seroma or lymphangioma within the left hemipelvis. 3. Subtle irregular hepatic capsule is suspicious for mild cirrhosis. Correlate with risk factors. 4. Coronary artery atherosclerosis. Aortic Atherosclerosis (ICD10-I70.0).   08/27/2017 Procedure   Successful placement of a right internal jugular approach power injectable Port-A-Cath. The catheter is ready for immediate use.   09/02/2017 - 12/16/2017 Chemotherapy   The patient had carboplatin and Taxol x 6 cycles   09/11/2017 Genetic Testing   Patient has genetic testing done for: The genes analyzed were the 83 genes on [facility]'s Multi-Cancer panel (ALK, APC, ATM, AXIN2, BAP1, BARD1, BLM, BMPR1A, BRCA1, BRCA2, BRIP1, CASR, CDC73, CDH1, CDK4,  CDKN1B, CDKN1C, CDKN2A, CEBPA, CHEK2, CTNNA1, DICER1, DIS3L2, EGFR, EPCAM, FH, FLCN, GATA2, GPC3, GREM1, HOXB13, HRAS, KIT, MAX, MEN1,  MET, MITF, MLH1, MSH2, MSH3, MSH6, MUTYH, NBN, NF1, NF2, NTHL1, PALB2, PDGFRA, PHOX2B, PMS2, POLD1, POLE, POT1, PRKAR1A, PTCH1, PTEN, RAD50, RAD51C, RAD51D, RB1, RECQL4, RET, RUNX1, SDHA, SDHAF2, SDHB, SDHC, SDHD, SMAD4, SMARCA4, SMARCB1, SMARCE1, STK11, SUFU, TERC, TERT, TMEM127, TP53, TSC1, TSC2, VHL, WRN, WT1). Results revealed patient has no detectable mutation   09/18/2017 - 10/13/2017 Radiation Therapy   Radiation treatment dates:  09/18/17-10/13/17   Site/dose:  Vaginal cuff/ 30 Gy in 5 fractions   Beams/energy:  HDR Ir-192 Vaginal/ Iridium-192    01/13/2018 Imaging   1. Indistinct right pelvic sidewall density measuring up to 5.1 cm in long axis, possibly from adenopathy or recurrent tumor, versus some form of complex postoperative fluid collection. PET-CT may be helpful in further assessment of this region for metabolic activity. Alternatively, surveillance might be considered. 2. Left adnexal or pelvic sidewall fluid density structural has enlarged, currently 67 cubic cm and previously about 42 cubic cm. This fluid density structure is more sharply defined and could be a postoperative seroma along the adnexa/pelvic sidewall. 3. Diffuse abnormal wall thickening in the urinary bladder favoring cystitis. I am unsure whether this is infectious or due to prior radiation therapy. Small amount of gas in the urinary bladder, correlate with any recent catheterization. 4. Diffuse wall thickening in the distal esophagus and gastroesophageal junction, probably from esophagitis. 5. Other imaging findings of potential clinical significance: Aortic Atherosclerosis (ICD10-I70.0). Coronary atherosclerosis. Upper normal splenic size. Sigmoid diverticulosis. Scattered terminal ileum diverticula.   04/11/2018 Imaging   CT imaging 1. Status post hysterectomy and bilateral  oophorectomy. Interval decrease in size of fluid density structures along both pelvic sidewalls. This favors postoperative seromas or lymphangioma. No findings to suggest recurrent or metastatic disease. 2. Bladder wall thickening again suggest cystitis. Gas within for which correlation with instrumentation is suggested. 3. Coronary artery atherosclerosis. Aortic Atherosclerosis (ICD10-I70.0). 4. Subtle irregular hepatic capsule, again suspicious for cirrhosis. 5. Cholecystectomy with similar mild common duct dilatation. Normal bilirubin on 07/30/2017 suggests this is within normal variation.     04/27/2018 Procedure   Successful right IJ vein Port-A-Cath explant.     Interval History: Patient presents today for surveillance visit.  She reports doing very well.  She denies any vaginal bleeding or discharge.  She endorses normal bowel and bladder function although is currently being treated for a urinary tract infection with Keflex which she started Monday.  She was having some dysuria which has improved since starting the antibiotics.  Her husband passed in December and she has been eating more since that time and has gained a little weight.  She denies any nausea or emesis, early satiety.  She denies any abdominal or pelvic pain.  Past Medical/Surgical History: Past Medical History:  Diagnosis Date   Cataract    Endometrial adenocarcinoma ([place])    Environmental and seasonal allergies    Fall 07/2019   Family history of ovarian cancer    Family history of pancreatic cancer    Fx anat neck humerus-open 07/2019   Genetic testing 09/19/2017   Multi-Cancer panel (83 genes) @ [facility] - No pathogenic mutations detected   GERD (gastroesophageal reflux disease)    History of DVT of lower extremity    early 2000s--- lower right leg   History of radiation therapy 09/18/2017-10/13/2017   vaginal brachytherapy ([facility]) - endometrial      Dr Gery Pray   Hyperlipidemia    Hypertension     Hypothyroidism    Type 2 diabetes mellitus ([place])  followed by pcp   Umbilical hernia    Vitamin B 12 deficiency    Vitamin D deficiency    Wears glasses     Past Surgical History:  Procedure Laterality Date   BREAST BIOPSY Left 2009   benign   CATARACT EXTRACTION W/ INTRAOCULAR LENS IMPLANT Left 2018   CATARACT EXTRACTION W/PHACO Right 03/14/2014   Procedure: CATARACT EXTRACTION PHACO AND INTRAOCULAR LENS PLACEMENT ([place]);  Surgeon: Tonny Branch, MD;  Location: [facility];  Service: Ophthalmology;  Laterality: Right;  CDE: 8.34   COLONOSCOPY WITH PROPOFOL N/A 08/11/2020   Procedure: COLONOSCOPY WITH PROPOFOL;  Surgeon: Harvel Quale, MD;  Location: [facility];  Service: Gastroenterology;  Laterality: N/A;  7:30   FOOT GANGLION EXCISION Right 2013   HYSTEROSCOPY WITH D & C N/A 06/13/2017   Procedure: DILATATION AND CURETTAGE /HYSTEROSCOPY;  Surgeon: Alden Hipp, MD;  Location: [facility];  Service: Gynecology;  Laterality: N/A;   IR FLUORO GUIDE PORT INSERTION RIGHT  08/27/2017   IR REMOVAL TUN ACCESS W/ PORT W/O FL MOD SED  04/27/2018   IR US GUIDE VASC ACCESS RIGHT  08/27/2017   LAPAROSCOPIC CHOLECYSTECTOMY  2005   POLYPECTOMY  08/11/2020   Procedure: POLYPECTOMY INTESTINAL;  Surgeon: Harvel Quale, MD;  Location: [facility];  Service: Gastroenterology;;   ROBOTIC ASSISTED TOTAL HYSTERECTOMY WITH BILATERAL SALPINGO OOPHERECTOMY Bilateral 08/05/2017   Procedure: ROBOTIC ASSISTED TOTAL HYSTERECTOMY WITH BILATERAL SALPINGO OOPHORECTOMY;  Surgeon: Everitt Amber, MD;  Location: [facility];  Service: Gynecology;  Laterality: Bilateral;   SENTINEL NODE BIOPSY Bilateral 08/05/2017   Procedure: SENTINEL NODE BIOPSY;  Surgeon: Everitt Amber, MD;  Location: [facility];  Service: Gynecology;  Laterality: Bilateral;   UMBILICAL HERNIA REPAIR N/A 08/05/2017   Procedure: HERNIA REPAIR UMBILICAL ADULT;  Surgeon: Everitt Amber, MD;  Location: [facility];  Service: Gynecology;  Laterality: N/A;   VEIN  SURGERY Right 2011   vein stripping-leg  and laser treatment    Family History  Problem Relation Age of Onset   Pancreatic cancer Mother 81       deceased 50   Ovarian cancer Mother        dx 58s   Cancer Other 104       maternal half-sister; unk. type    Social History   Socioeconomic History   Marital status: Widowed    Spouse name: Johnny   Number of children: 4   Years of education: Not on file   Highest education level: Some college, no degree  Occupational History   Occupation: retired  Tobacco Use   Smoking status: Never   Smokeless tobacco: Never  Vaping Use   Vaping Use: Never used  Substance and Sexual Activity   Alcohol use: No   Drug use: No   Sexual activity: Yes    Birth control/protection: Post-menopausal  Other Topics Concern   Not on file  Social History Narrative   Her husband passed 05/2020 - her daughter, Lattie Haw lives with her   Social Determinants of Health   Financial Resource Strain: Low Risk    Difficulty of Paying Living Expenses: Not very hard  Food Insecurity: No Food Insecurity   Worried About Charity fundraiser in the Last Year: Never true   Arboriculturist in the Last Year: Never true  Transportation Needs: No Transportation Needs   Lack of Transportation (Medical): No   Lack of Transportation (Non-Medical): No  Physical Activity: Inactive   Days of Exercise per Week:  0 days   Minutes of Exercise per Session: 0 min  Stress: Stress Concern Present   Feeling of Stress : To some extent  Social Connections: Moderately Isolated   Frequency of Communication with Friends and Family: More than three times a week   Frequency of Social Gatherings with Friends and Family: More than three times a week   Attends Religious Services: 1 to 4 times per year   Active Member of [employer] or Organizations: No   Attends Archivist Meetings: Never   Marital Status: Widowed    Current Medications:  Current Outpatient Medications:     acetaminophen (TYLENOL) 500 MG tablet, Take 1,000 mg by mouth every 8 (eight) hours as needed for mild pain or headache., Disp: , Rfl:    Ascorbic Acid (VITAMIN C) 100 MG CHEW, Chew 100 mg by mouth daily., Disp: , Rfl:    aspirin EC 81 MG tablet, Take 81 mg by mouth daily., Disp: , Rfl:    cephALEXin (KEFLEX) 500 MG capsule, Take 1 capsule (500 mg total) by mouth 2 (two) times daily., Disp: 14 capsule, Rfl: 0   cycloSPORINE (RESTASIS) 0.05 % ophthalmic emulsion, Place 1 drop into both eyes 2 (two) times daily. , Disp: , Rfl:    dapagliflozin propanediol (FARXIGA) 10 MG TABS tablet, Take 1 tablet (10 mg total) by mouth daily before breakfast., Disp: 30 tablet, Rfl: 0   furosemide (LASIX) 20 MG tablet, Take 0.5 tablets (10 mg total) by mouth daily., Disp: 45 tablet, Rfl: 1   glimepiride (AMARYL) 4 MG tablet, TAKE 1 TABLET BY MOUTH DAILY WITH BREAKFAST, Disp: 90 tablet, Rfl: 0   levothyroxine (SYNTHROID) 88 MCG tablet, TAKE 1 TABLET BY MOUTH EVERY DAY, Disp: 90 tablet, Rfl: 1   losartan (COZAAR) 100 MG tablet, Take 1 tablet (100 mg total) by mouth daily., Disp: 90 tablet, Rfl: 2   Magnesium 250 MG TABS, Take 250 mg by mouth every evening. , Disp: , Rfl:    meclizine (ANTIVERT) 25 MG tablet, TAKE 1 TABLET (25 MG TOTAL) BY MOUTH 2 (TWO) TIMES DAILY AS NEEDED FOR DIZZINESS., Disp: 60 tablet, Rfl: 3   metFORMIN (GLUCOPHAGE-XR) 500 MG 24 hr tablet, TAKE 2 TABLETS BY MOUTH EVERY DAY WITH BREAKFAST, Disp: 180 tablet, Rfl: 0   metoprolol succinate (TOPROL-XL) 50 MG 24 hr tablet, Take 1 tablet (50 mg total) by mouth 2 (two) times daily. Take with or immediately following a meal., Disp: 180 tablet, Rfl: 2   simvastatin (ZOCOR) 40 MG tablet, TAKE 1 TABLET BY MOUTH DAILY AT 6 PM., Disp: 90 tablet, Rfl: 0   Vitamin D, Ergocalciferol, (DRISDOL) 1.25 MG (50000 UNIT) CAPS capsule, Take 1 capsule (50,000 Units total) by mouth every 7 (seven) days., Disp: 12 capsule, Rfl: 0   Calcium Citrate-Vitamin D ([employer] CALCIUM  CITRATE +D PO), Take 2 tablets by mouth every evening.  (Patient not taking: Reported on 05/08/2021), Disp: , Rfl:    glucose blood (ONETOUCH VERIO) test strip, USE TO TEST BLOOD SUGAR DAILY Dx E11.9, Disp: 100 strip, Rfl: 3   Lancets (ONETOUCH DELICA PLUS [id number]) MISC, 1 each by Other route daily. Dx E11.9, Disp: 100 each, Rfl: 8   Omega-3 Fatty Acids (FISH OIL) 1000 MG CAPS, Take 1,000 mg by mouth every evening. (Patient not taking: Reported on 05/08/2021), Disp: , Rfl:   Review of Systems: Denies appetite changes, fevers, chills, fatigue, unexplained weight changes. Denies hearing loss, neck lumps or masses, mouth sores, ringing in ears or voice changes.  Denies cough or wheezing.  Denies shortness of breath. Denies chest pain or palpitations. Denies leg swelling. Denies abdominal distention, pain, blood in stools, constipation, diarrhea, nausea, vomiting, or early satiety. Denies pain with intercourse, dysuria, frequency, hematuria or incontinence. Denies hot flashes, pelvic pain, vaginal bleeding or vaginal discharge.   Denies joint pain, back pain or muscle pain/cramps. Denies itching, rash, or wounds. Denies dizziness, headaches, numbness or seizures. Denies swollen lymph nodes or glands, denies easy bruising or bleeding. Denies anxiety, depression, confusion, or decreased concentration.  Physical Exam: BP (!) 144/70 (BP Location: Right Arm, Patient Position: Sitting)   Pulse (!) 56   Temp 98.7 F (37.1 C) (Tympanic)   Resp 16   Ht '5\' 1"'  (1.549 m)   Wt 167 lb (75.8 kg)   SpO2 98%   BMI 31.55 kg/m  General: Alert, oriented, no acute distress. HEENT: Normocephalic, atraumatic, sclera anicteric. Chest: Clear to auscultation bilaterally.  Unlabored breathing on room air. Cardiovascular: Regular rate and rhythm, no murmurs. Abdomen: Obese, soft, nontender.  Normoactive bowel sounds.  No masses or hepatosplenomegaly appreciated.  Well-healed incisions.  Ventral hernia  noted. Extremities: Grossly normal range of motion.  Warm, well perfused.  No edema bilaterally. Skin: No rashes or lesions noted. Lymphatics: No cervical, supraclavicular, or inguinal adenopathy. GU: Normal appearing external genitalia without erythema, excoriation, or lesions.  Speculum exam reveals moderately atrophic vaginal mucosa with radiation changes.  Cuff visually intact, no masses, somewhat foreshortened vagina.  Bimanual exam reveals cuff intact, no nodularity or masses.  Rectovaginal exam confirms findings.  Laboratory & Radiologic Studies: None new  Assessment & Plan: Alexandria Irwin is a 75 y.o. 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 Genetic testing negative. S/p adjuvant carboplatin and paclitaxel chemotherapy x 6 cycles with adjuvant vaginal brachytherapy for local control completed in June, 2019 with a complete clinical response.   Patient is doing very well and is NED on exam today.  She is now almost 3-1/2 years out from completion of treatment.  We will continue with follow-up every 6 months until she is 5 years out from treatment and then transition to annual visits.  She will see Dr. Sondra Come next in 6 months and then me in a year.  We reviewed signs and symptoms that would be concerning for disease recurrence and she knows to call if she develops any of these between visits.  32 minutes of total time was spent for this patient encounter, including preparation, face-to-face counseling with the patient and coordination of care, and documentation of the encounter.  Jeral Pinch, MD  Division of Gynecologic Oncology  Department of Obstetrics and Gynecology  [facility]

## 2021-05-09 NOTE — Patient Instructions (Signed)
It was a pleasure meeting you today.  I do not see or feel any evidence of cancer recurrence on your exam.  If you develop any of the symptoms that we talked about today, such as vaginal bleeding or pelvic pain, please call to see me sooner.  Your next visit will be with radiation oncology in 6 months.  At that visit, please ask them to help coordinate getting your next visit with me scheduled in November of next year.

## 2021-05-11 ENCOUNTER — Ambulatory Visit: Payer: Medicare Other | Admitting: Gynecologic Oncology

## 2021-05-15 DIAGNOSIS — H04123 Dry eye syndrome of bilateral lacrimal glands: Secondary | ICD-10-CM | POA: Diagnosis not present

## 2021-05-15 DIAGNOSIS — H40033 Anatomical narrow angle, bilateral: Secondary | ICD-10-CM | POA: Diagnosis not present

## 2021-05-18 ENCOUNTER — Other Ambulatory Visit: Payer: Self-pay | Admitting: Family

## 2021-05-18 DIAGNOSIS — E1169 Type 2 diabetes mellitus with other specified complication: Secondary | ICD-10-CM

## 2021-05-18 NOTE — Telephone Encounter (Signed)
Allergy showing up when approving medication  Please advise

## 2021-05-21 ENCOUNTER — Other Ambulatory Visit: Payer: Self-pay | Admitting: Family

## 2021-05-21 MED ORDER — FLUCONAZOLE 150 MG PO TABS: 150.0000 mg | ORAL_TABLET | ORAL | 0 refills | Status: DC | PRN

## 2021-05-28 ENCOUNTER — Other Ambulatory Visit: Payer: Self-pay | Admitting: Family

## 2021-05-28 DIAGNOSIS — E559 Vitamin D deficiency, unspecified: Secondary | ICD-10-CM

## 2021-05-28 MED ORDER — VITAMIN D (ERGOCALCIFEROL) 1.25 MG (50000 UNIT) PO CAPS: 50000.0000 [IU] | ORAL_CAPSULE | ORAL | 0 refills | Status: DC

## 2021-05-31 DIAGNOSIS — E1142 Type 2 diabetes mellitus with diabetic polyneuropathy: Secondary | ICD-10-CM | POA: Diagnosis not present

## 2021-05-31 DIAGNOSIS — L84 Corns and callosities: Secondary | ICD-10-CM | POA: Diagnosis not present

## 2021-05-31 DIAGNOSIS — M79676 Pain in unspecified toe(s): Secondary | ICD-10-CM | POA: Diagnosis not present

## 2021-05-31 DIAGNOSIS — B351 Tinea unguium: Secondary | ICD-10-CM | POA: Diagnosis not present

## 2021-06-20 ENCOUNTER — Other Ambulatory Visit: Payer: Self-pay | Admitting: Family

## 2021-06-20 DIAGNOSIS — R42 Dizziness and giddiness: Secondary | ICD-10-CM

## 2021-07-03 ENCOUNTER — Other Ambulatory Visit: Payer: Self-pay | Admitting: Family

## 2021-07-03 MED ORDER — FLUCONAZOLE 150 MG PO TABS: 150.0000 mg | ORAL_TABLET | ORAL | 0 refills | Status: DC | PRN

## 2021-07-06 MED ORDER — DAPAGLIFLOZIN PROPANEDIOL 10 MG PO TABS: 10.0000 mg | ORAL_TABLET | Freq: Every day | ORAL | 3 refills | Status: DC

## 2021-07-13 ENCOUNTER — Other Ambulatory Visit: Payer: Self-pay | Admitting: Family

## 2021-07-13 DIAGNOSIS — R42 Dizziness and giddiness: Secondary | ICD-10-CM

## 2021-07-15 ENCOUNTER — Telehealth: Payer: Medicare Other | Admitting: Emergency Medicine

## 2021-07-15 DIAGNOSIS — J019 Acute sinusitis, unspecified: Secondary | ICD-10-CM | POA: Diagnosis not present

## 2021-07-15 MED ORDER — FLUCONAZOLE 150 MG PO TABS: 150.0000 mg | ORAL_TABLET | Freq: Once | ORAL | 0 refills | Status: AC

## 2021-07-15 MED ORDER — AMOXICILLIN-POT CLAVULANATE 500-125 MG PO TABS: 500.0000 mg | ORAL_TABLET | Freq: Two times a day (BID) | ORAL | 0 refills | Status: AC

## 2021-07-15 NOTE — Progress Notes (Signed)
I have spent 5 minutes in review of e-visit questionnaire, review and updating patient chart, medical decision making and response to patient.   Carlisle Enke, PA-C    

## 2021-07-15 NOTE — Progress Notes (Signed)
E-Visit for Sinus Problems  We are sorry that you are not feeling well.  Here is how we plan to help!  Based on what you have shared with me it looks like you have sinusitis.  Sinusitis is inflammation and infection in the sinus cavities of the head.  Based on your presentation I believe you most likely have Acute Bacterial Sinusitis.  This is an infection caused by bacteria and is treated with antibiotics. I have prescribed augmentin 500 mg twice daily for 10 days.  I have also prescribed diflucan for possible yeast infection.   You may use an oral decongestant such as Mucinex D or if you have glaucoma or high blood pressure use plain Mucinex. Saline nasal spray help and can safely be used as often as needed for congestion.  If you develop worsening sinus pain, fever or notice severe headache and vision changes, or if symptoms are not better after completion of antibiotic, please schedule an appointment with a health care provider.    Sinus infections are not as easily transmitted as other respiratory infection, however we still recommend that you avoid close contact with loved ones, especially the very young and elderly.  Remember to wash your hands thoroughly throughout the day as this is the number one way to prevent the spread of infection!  Home Care: Only take medications as instructed by your medical team. Complete the entire course of an antibiotic. Do not take these medications with alcohol. A steam or ultrasonic humidifier can help congestion.  You can place a towel over your head and breathe in the steam from hot water coming from a faucet. Avoid close contacts especially the very young and the elderly. Cover your mouth when you cough or sneeze. Always remember to wash your hands.  Get Help Right Away If: You develop worsening fever or sinus pain. You develop a severe head ache or visual changes. Your symptoms persist after you have completed your treatment plan.  Make sure  you Understand these instructions. Will watch your condition. Will get help right away if you are not doing well or get worse.  Thank you for choosing an e-visit.  Your e-visit answers were reviewed by a board certified advanced clinical practitioner to complete your personal care plan. Depending upon the condition, your plan could have included both over the counter or prescription medications.  Please review your pharmacy choice. Make sure the pharmacy is open so you can pick up prescription now. If there is a problem, you may contact your provider through [employer] and have the prescription routed to another pharmacy.  Your safety is important to [place]. If you have drug allergies check your prescription carefully.   For the next 24 hours you can use MyChart to ask questions about today's visit, request a non-urgent call back, or ask for a work or school excuse. You will get an email in the next two days asking about your experience. I hope that your e-visit has been valuable and will speed your recovery.

## 2021-07-17 ENCOUNTER — Other Ambulatory Visit: Payer: Self-pay | Admitting: Family

## 2021-07-17 DIAGNOSIS — E1169 Type 2 diabetes mellitus with other specified complication: Secondary | ICD-10-CM

## 2021-07-17 MED ORDER — BLOOD GLUCOSE METER KIT: PACK | 0 refills | Status: AC

## 2021-07-19 ENCOUNTER — Other Ambulatory Visit: Payer: Self-pay | Admitting: Family

## 2021-07-20 ENCOUNTER — Other Ambulatory Visit: Payer: Self-pay | Admitting: Family

## 2021-07-20 DIAGNOSIS — E1169 Type 2 diabetes mellitus with other specified complication: Secondary | ICD-10-CM

## 2021-07-25 ENCOUNTER — Other Ambulatory Visit: Payer: Self-pay | Admitting: Family

## 2021-07-25 DIAGNOSIS — E1159 Type 2 diabetes mellitus with other circulatory complications: Secondary | ICD-10-CM

## 2021-08-02 ENCOUNTER — Ambulatory Visit (INDEPENDENT_AMBULATORY_CARE_PROVIDER_SITE_OTHER): Payer: Medicare Other | Admitting: Family Medicine

## 2021-08-02 ENCOUNTER — Encounter: Payer: Self-pay | Admitting: Family Medicine

## 2021-08-02 ENCOUNTER — Ambulatory Visit: Payer: Medicare Other | Admitting: Family

## 2021-08-02 VITALS — BP 122/72 | HR 52 | Temp 97.9°F | Ht 61.0 in | Wt 152.0 lb

## 2021-08-02 DIAGNOSIS — A09 Infectious gastroenteritis and colitis, unspecified: Secondary | ICD-10-CM

## 2021-08-02 DIAGNOSIS — R059 Cough, unspecified: Secondary | ICD-10-CM | POA: Diagnosis not present

## 2021-08-02 DIAGNOSIS — R052 Subacute cough: Secondary | ICD-10-CM | POA: Diagnosis not present

## 2021-08-02 LAB — BMP8+EGFR
BUN/Creatinine Ratio: 22 (ref 12–28)
BUN: 30 mg/dL — ABNORMAL HIGH (ref 8–27)
CO2: 25 mmol/L (ref 20–29)
Calcium: 11.2 mg/dL — ABNORMAL HIGH (ref 8.7–10.3)
Chloride: 100 mmol/L (ref 96–106)
Creatinine, Ser: 1.35 mg/dL — ABNORMAL HIGH (ref 0.57–1.00)
Glucose: 163 mg/dL — ABNORMAL HIGH (ref 70–99)
Potassium: 5.6 mmol/L — ABNORMAL HIGH (ref 3.5–5.2)
Sodium: 141 mmol/L (ref 134–144)
eGFR: 40 mL/min/{1.73_m2} — ABNORMAL LOW (ref >59)

## 2021-08-02 LAB — CBC WITH DIFFERENTIAL/PLATELET
Basophils Absolute: 0 10*3/uL (ref 0.0–0.2)
Basos: 1 %
EOS (ABSOLUTE): 0.1 10*3/uL (ref 0.0–0.4)
Eos: 1 %
Hematocrit: 41.6 % (ref 34.0–46.6)
Hemoglobin: 14.4 g/dL (ref 11.1–15.9)
Immature Grans (Abs): 0 10*3/uL (ref 0.0–0.1)
Immature Granulocytes: 0 %
Lymphocytes Absolute: 2.1 10*3/uL (ref 0.7–3.1)
Lymphs: 30 %
MCH: 33.2 pg — ABNORMAL HIGH (ref 26.6–33.0)
MCHC: 34.6 g/dL (ref 31.5–35.7)
MCV: 96 fL (ref 79–97)
Monocytes Absolute: 0.7 10*3/uL (ref 0.1–0.9)
Monocytes: 9 %
Neutrophils Absolute: 4.3 10*3/uL (ref 1.4–7.0)
Neutrophils: 59 %
Platelets: 201 10*3/uL (ref 150–450)
RBC: 4.34 x10E6/uL (ref 3.77–5.28)
RDW: 12.6 % (ref 11.7–15.4)
WBC: 7.2 10*3/uL (ref 3.4–10.8)

## 2021-08-02 LAB — VERITOR FLU A/B WAIVED
Influenza A: NEGATIVE
Influenza B: NEGATIVE

## 2021-08-02 MED ORDER — VANCOMYCIN HCL 125 MG PO CAPS: 125.0000 mg | ORAL_CAPSULE | Freq: Four times a day (QID) | ORAL | 0 refills | Status: AC

## 2021-08-02 MED ORDER — BENZONATATE 100 MG PO CAPS: 100.0000 mg | ORAL_CAPSULE | Freq: Three times a day (TID) | ORAL | 0 refills | Status: DC | PRN

## 2021-08-02 NOTE — Progress Notes (Addendum)
Subjective:  Patient ID: Alexandria Irwin, female    DOB: [birth year], 76 y.o.   MRN: [id number]  Patient Care Team: Sharion Balloon, FNP as PCP - General (Family Medicine)   Chief Complaint:  Cough and Diarrhea   HPI: Alexandria Irwin is a 76 y.o. female presenting on 08/02/2021 for Cough and Diarrhea   Pt presents with cough and diarrhea. She was recently treated with a full course Augmentin for sinus infection beginning on 07/16/21.  She developed diarrhea when beginning the Augmentin and continues to have multiple episodes of loose stool a day.  Stool is straight liquid in quality for the past 3 weeks. She still has a productive cough that she states is improving.    Relevant past medical, surgical, family, and social history reviewed and updated as indicated.  Allergies and medications reviewed and updated. Data reviewed: Chart in Epic.   Past Medical History:  Diagnosis Date   Cataract    Endometrial adenocarcinoma ([place])    Environmental and seasonal allergies    Fall 07/2019   Family history of ovarian cancer    Family history of pancreatic cancer    Fx anat neck humerus-open 07/2019   Genetic testing 09/19/2017   Multi-Cancer panel (83 genes) @ [facility] - No pathogenic mutations detected   GERD (gastroesophageal reflux disease)    History of DVT of lower extremity    early 2000s--- lower right leg   History of radiation therapy 09/18/2017-10/13/2017   vaginal brachytherapy (HDR) - endometrial      Dr Gery Pray   Hyperlipidemia    Hypertension    Hypothyroidism    Type 2 diabetes mellitus ([place])    followed by pcp   Umbilical hernia    Vitamin B 12 deficiency    Vitamin D deficiency    Wears glasses     Past Surgical History:  Procedure Laterality Date   BREAST BIOPSY Left 2009   benign   CATARACT EXTRACTION W/ INTRAOCULAR LENS IMPLANT Left 2018   CATARACT EXTRACTION W/PHACO Right 03/14/2014   Procedure: CATARACT EXTRACTION PHACO AND INTRAOCULAR LENS PLACEMENT  ([place]);  Surgeon: Tonny Branch, MD;  Location: [facility];  Service: Ophthalmology;  Laterality: Right;  CDE: 8.34   COLONOSCOPY WITH PROPOFOL N/A 08/11/2020   Procedure: COLONOSCOPY WITH PROPOFOL;  Surgeon: Harvel Quale, MD;  Location: [facility];  Service: Gastroenterology;  Laterality: N/A;  7:30   FOOT GANGLION EXCISION Right 2013   HYSTEROSCOPY WITH D & C N/A 06/13/2017   Procedure: DILATATION AND CURETTAGE /HYSTEROSCOPY;  Surgeon: Alden Hipp, MD;  Location: [facility];  Service: Gynecology;  Laterality: N/A;   IR FLUORO GUIDE PORT INSERTION RIGHT  08/27/2017   IR REMOVAL TUN ACCESS W/ PORT W/O FL MOD SED  04/27/2018   IR US GUIDE VASC ACCESS RIGHT  08/27/2017   LAPAROSCOPIC CHOLECYSTECTOMY  2005   POLYPECTOMY  08/11/2020   Procedure: POLYPECTOMY INTESTINAL;  Surgeon: Harvel Quale, MD;  Location: [facility];  Service: Gastroenterology;;   ROBOTIC ASSISTED TOTAL HYSTERECTOMY WITH BILATERAL SALPINGO OOPHERECTOMY Bilateral 08/05/2017   Procedure: ROBOTIC ASSISTED TOTAL HYSTERECTOMY WITH BILATERAL SALPINGO OOPHORECTOMY;  Surgeon: Everitt Amber, MD;  Location: [facility];  Service: Gynecology;  Laterality: Bilateral;   SENTINEL NODE BIOPSY Bilateral 08/05/2017   Procedure: SENTINEL NODE BIOPSY;  Surgeon: Everitt Amber, MD;  Location: [facility];  Service: Gynecology;  Laterality: Bilateral;   UMBILICAL HERNIA REPAIR N/A 08/05/2017   Procedure: HERNIA REPAIR UMBILICAL ADULT;  Surgeon:  Everitt Amber, MD;  Location: [facility];  Service: Gynecology;  Laterality: N/A;   VEIN SURGERY Right 2011   vein stripping-leg  and laser treatment    Social History   Socioeconomic History   Marital status: Widowed    Spouse name: Johnny   Number of children: 4   Years of education: Not on file   Highest education level: Some college, no degree  Occupational History   Occupation: retired  Tobacco Use   Smoking status: Never   Smokeless tobacco: Never  Vaping Use   Vaping Use: Never used  Substance  and Sexual Activity   Alcohol use: No   Drug use: No   Sexual activity: Yes    Birth control/protection: Post-menopausal  Other Topics Concern   Not on file  Social History Narrative   Her husband passed 05/2020 - her daughter, Lattie Haw lives with her   Social Determinants of Health   Financial Resource Strain: Low Risk    Difficulty of Paying Living Expenses: Not very hard  Food Insecurity: No Food Insecurity   Worried About Charity fundraiser in the Last Year: Never true   Arboriculturist in the Last Year: Never true  Transportation Needs: No Transportation Needs   Lack of Transportation (Medical): No   Lack of Transportation (Non-Medical): No  Physical Activity: Inactive   Days of Exercise per Week: 0 days   Minutes of Exercise per Session: 0 min  Stress: Stress Concern Present   Feeling of Stress : To some extent  Social Connections: Moderately Isolated   Frequency of Communication with Friends and Family: More than three times a week   Frequency of Social Gatherings with Friends and Family: More than three times a week   Attends Religious Services: 1 to 4 times per year   Active Member of [employer] or Organizations: No   Attends Archivist Meetings: Never   Marital Status: Widowed  Intimate Partner Violence: Not At Risk   Fear of Current or Ex-Partner: No   Emotionally Abused: No   Physically Abused: No   Sexually Abused: No    Outpatient Encounter Medications as of 08/02/2021  Medication Sig   vancomycin (VANCOCIN) 125 MG capsule Take 1 capsule (125 mg total) by mouth 4 (four) times daily for 10 days.   acetaminophen (TYLENOL) 500 MG tablet Take 1,000 mg by mouth every 8 (eight) hours as needed for mild pain or headache.   Ascorbic Acid (VITAMIN C) 100 MG CHEW Chew 100 mg by mouth daily.   aspirin EC 81 MG tablet Take 81 mg by mouth daily.   blood glucose meter kit and supplies Dispense based on patient and insurance preference. Use up to four times daily as  directed. (FOR ICD-10 E10.9, E11.9).   cycloSPORINE (RESTASIS) 0.05 % ophthalmic emulsion Place 1 drop into both eyes 2 (two) times daily.    dapagliflozin propanediol (FARXIGA) 10 MG TABS tablet Take 1 tablet (10 mg total) by mouth daily before breakfast.   furosemide (LASIX) 20 MG tablet TAKE 1/2 TABLET BY MOUTH DAILY   glimepiride (AMARYL) 4 MG tablet TAKE 1 TABLET BY MOUTH EVERY DAY WITH BREAKFAST   glucose blood (ONETOUCH VERIO) test strip USE TO TEST BLOOD SUGAR DAILY Dx E11.9   Lancets (ONETOUCH DELICA PLUS [id number]) MISC 1 each by Other route daily. Dx E11.9   levothyroxine (SYNTHROID) 88 MCG tablet TAKE 1 TABLET BY MOUTH EVERY DAY   losartan (COZAAR) 100 MG tablet Take 1 tablet (  100 mg total) by mouth daily.   Magnesium 250 MG TABS Take 250 mg by mouth every evening.    meclizine (ANTIVERT) 25 MG tablet TAKE 1 TABLET (25 MG TOTAL) BY MOUTH 2 (TWO) TIMES DAILY AS NEEDED FOR DIZZINESS.   metFORMIN (GLUCOPHAGE-XR) 500 MG 24 hr tablet TAKE 2 TABLETS BY MOUTH EVERY DAY WITH BREAKFAST   metoprolol succinate (TOPROL-XL) 50 MG 24 hr tablet TAKE 1 TABLET (50 MG TOTAL) BY MOUTH 2 (TWO) TIMES DAILY. TAKE WITH OR IMMEDIATELY FOLLOWING A MEAL.   simvastatin (ZOCOR) 40 MG tablet TAKE 1 TABLET BY MOUTH DAILY AT 6 PM.   Vitamin D, Ergocalciferol, (DRISDOL) 1.25 MG (50000 UNIT) CAPS capsule Take 1 capsule (50,000 Units total) by mouth every 7 (seven) days.   [DISCONTINUED] metoprolol (LOPRESSOR) 50 MG tablet TAKE 1 TABLET (50 MG TOTAL) BY MOUTH 2 (TWO) TIMES DAILY.   No facility-administered encounter medications on file as of 08/02/2021.    Allergies  Allergen Reactions   Ace Inhibitors Cough   Ciprocin-Fluocin-Procin [Fluocinolone] Hives   Norvasc [Amlodipine] Cough   Latex Rash   Neosporin [Neomycin-Bacitracin Zn-Polymyx] Rash   Sulfa Antibiotics Rash    Review of Systems  Constitutional:  Positive for appetite change. Negative for activity change, chills, diaphoresis, fatigue, fever and  unexpected weight change.  Respiratory:  Positive for cough. Negative for shortness of breath.   Cardiovascular:  Negative for chest pain, palpitations and leg swelling.  Gastrointestinal:  Positive for diarrhea. Negative for abdominal distention, abdominal pain, anal bleeding, blood in stool, constipation, nausea, rectal pain and vomiting.  Genitourinary:  Negative for decreased urine volume and difficulty urinating.  Skin:  Positive for pallor.  Neurological:  Negative for dizziness, weakness, light-headedness and headaches.  Psychiatric/Behavioral:  Negative for confusion.   All other systems reviewed and are negative.      Objective:  BP 122/72    Pulse (!) 52    Temp 97.9 F (36.6 C) (Temporal)    Ht _0  (1.549 m)    Wt 68.9 kg    SpO2 96%    BMI 28.72 kg/m    Wt Readings from Last 3 Encounters:  08/02/21 68.9 kg  05/09/21 75.8 kg  04/30/21 75.3 kg    Physical Exam Vitals and nursing note reviewed.  Constitutional:      Appearance: She is ill-appearing.  HENT:     Head: Normocephalic and atraumatic.     Mouth/Throat:     Mouth: Mucous membranes are moist.  Eyes:     Extraocular Movements: Extraocular movements intact.     Conjunctiva/sclera: Conjunctivae normal.     Pupils: Pupils are equal, round, and reactive to light.  Cardiovascular:     Rate and Rhythm: Normal rate and regular rhythm.     Pulses: Normal pulses.     Heart sounds: Normal heart sounds.  Pulmonary:     Effort: Pulmonary effort is normal.     Breath sounds: Rhonchi (minimal, clears wtih cough) present.  Abdominal:     General: Bowel sounds are normal. There is no distension.     Palpations: Abdomen is soft.     Tenderness: There is no abdominal tenderness.  Musculoskeletal:     Cervical back: Neck supple.     Right lower leg: No edema.     Left lower leg: No edema.  Skin:    General: Skin is warm and dry.     Capillary Refill: Capillary refill takes less than 2 seconds.  Neurological:  General: No focal deficit present.     Mental Status: She is alert and oriented to person, place, and time. Mental status is at baseline.  Psychiatric:        Mood and Affect: Mood normal.        Behavior: Behavior normal.        Thought Content: Thought content normal.        Judgment: Judgment normal.    Results for orders placed or performed in visit on 04/30/21  Urine Culture   Specimen: Urine   UR  Result Value Ref Range   Urine Culture, Routine Final report (A)    Organism ID, Bacteria Escherichia coli (A)    Antimicrobial Susceptibility Comment   Microscopic Examination   Urine  Result Value Ref Range   WBC, UA 0-5 0 - 5 /hpf   RBC None seen 0 - 2 /hpf   Epithelial Cells (non renal) 0-10 0 - 10 /hpf   Bacteria, UA None seen None seen/Few  CBC with Differential/Platelet  Result Value Ref Range   WBC 7.1 3.4 - 10.8 x10E3/uL   RBC 4.74 3.77 - 5.28 x10E6/uL   Hemoglobin 15.4 11.1 - 15.9 g/dL   Hematocrit 45.3 34.0 - 46.6 %   MCV 96 79 - 97 fL   MCH 32.5 26.6 - 33.0 pg   MCHC 34.0 31.5 - 35.7 g/dL   RDW 12.7 11.7 - 15.4 %   Platelets 193 150 - 450 x10E3/uL   Neutrophils 49 Not Estab. %   Lymphs 40 Not Estab. %   Monocytes 7 Not Estab. %   Eos 3 Not Estab. %   Basos 1 Not Estab. %   Neutrophils Absolute 3.5 1.4 - 7.0 x10E3/uL   Lymphocytes Absolute 2.9 0.7 - 3.1 x10E3/uL   Monocytes Absolute 0.5 0.1 - 0.9 x10E3/uL   EOS (ABSOLUTE) 0.2 0.0 - 0.4 x10E3/uL   Basophils Absolute 0.0 0.0 - 0.2 x10E3/uL   Immature Granulocytes 0 Not Estab. %   Immature Grans (Abs) 0.0 0.0 - 0.1 x10E3/uL  CMP14+EGFR  Result Value Ref Range   Glucose 191 (H) 70 - 99 mg/dL   BUN 15 8 - 27 mg/dL   Creatinine, Ser 1.12 (H) 0.57 - 1.00 mg/dL   eGFR 51 (L) >59 mL/min/1.73   BUN/Creatinine Ratio 13 12 - 28   Sodium 141 134 - 144 mmol/L   Potassium 4.9 3.5 - 5.2 mmol/L   Chloride 100 96 - 106 mmol/L   CO2 26 20 - 29 mmol/L   Calcium 10.2 8.7 - 10.3 mg/dL   Total Protein 7.2 6.0 - 8.5 g/dL    Albumin 4.8 (H) 3.7 - 4.7 g/dL   Globulin, Total 2.4 1.5 - 4.5 g/dL   Albumin/Globulin Ratio 2.0 1.2 - 2.2   Bilirubin Total 0.9 0.0 - 1.2 mg/dL   Alkaline Phosphatase 44 44 - 121 IU/L   AST 22 0 - 40 IU/L   ALT 20 0 - 32 IU/L  Lipid panel  Result Value Ref Range   Cholesterol, Total 136 100 - 199 mg/dL   Triglycerides 157 (H) 0 - 149 mg/dL   HDL 47 >39 mg/dL   VLDL Cholesterol Cal 27 5 - 40 mg/dL   LDL Chol Calc (NIH) 62 0 - 99 mg/dL   Chol/HDL Ratio 2.9 0.0 - 4.4 ratio  Bayer DCA Hb A1c Waived  Result Value Ref Range   HB A1C (BAYER DCA - WAIVED) 7.3 (H) 4.8 - 5.6 %  Thyroid Panel  With TSH  Result Value Ref Range   TSH 1.730 0.450 - 4.500 uIU/mL   T4, Total 9.1 4.5 - 12.0 ug/dL   T3 Uptake Ratio 28 24 - 39 %   Free Thyroxine Index 2.5 1.2 - 4.9  Urinalysis, Complete  Result Value Ref Range   Specific Gravity, UA 1.020 1.005 - 1.030   pH, UA 5.0 5.0 - 7.5   Color, UA Yellow Yellow   Appearance Ur Clear Clear   Leukocytes,UA Negative Negative   Protein,UA Negative Negative/Trace   Glucose, UA 3+ (A) Negative   Ketones, UA Negative Negative   RBC, UA Negative Negative   Bilirubin, UA Negative Negative   Urobilinogen, Ur 0.2 0.2 - 1.0 mg/dL   Nitrite, UA Negative Negative   Microscopic Examination See below:      Rapid flu negative today   Pertinent labs & imaging results that were available during my care of the patient were reviewed by me and considered in my medical decision making.  Assessment & Plan:  Alexandria Irwin was seen today for cough and diarrhea.  Diagnoses and all orders for this visit:  Subacute cough Influenza negative.  -     Veritor Flu A/B Waived -     Novel Coronavirus, NAA ([facility])  Diarrhea of infectious origin -     vancomycin (VANCOCIN) 125 MG capsule; Take 1 capsule (125 mg total) by mouth 4 (four) times daily for 10 days. -     BMP8+EGFR -     CBC with Differential/Platelet -     Cdiff NAA+O+P+Stool Culture - Suspected C-Diff due to 3  weeks of liquid diarrhea and recent antibiotic use. Will empirically treat with vancomycin. Stool kit given and will adjust treatment plan as needed. BMP to assess Potassium.      Continue all other maintenance medications.  Follow up plan: Return in 2 weeks (on 08/16/2021), or if symptoms worsen or fail to improve.   Continue healthy lifestyle choices, including diet (rich in fruits, vegetables, and lean proteins, and low in salt and simple carbohydrates) and exercise (at least 30 minutes of moderate physical activity daily).  Educational handout given for Vancomycin.   The above assessment and management plan was discussed with the patient. The patient verbalized understanding of and has agreed to the management plan. Patient is aware to call the clinic if they develop any new symptoms or if symptoms persist or worsen. Patient is aware when to return to the clinic for a follow-up visit. Patient educated on when it is appropriate to go to the emergency department.   Deidre Ala, NP-S  I personally was present during the history, physical exam, and medical decision-making activities of this visit and have verified that the services and findings are accurately documented in the nurse practitioner student's note.  Monia Pouch, FNP-C [facility] [address] [place], [place] [address] [phone]

## 2021-08-03 LAB — SARS-COV-2, NAA 2 DAY TAT

## 2021-08-03 LAB — NOVEL CORONAVIRUS, NAA: SARS-CoV-2, NAA: NOT DETECTED

## 2021-08-06 ENCOUNTER — Ambulatory Visit: Payer: Medicare Other | Admitting: Family

## 2021-08-08 ENCOUNTER — Encounter: Payer: Self-pay | Admitting: Family

## 2021-08-08 ENCOUNTER — Ambulatory Visit (INDEPENDENT_AMBULATORY_CARE_PROVIDER_SITE_OTHER): Payer: Medicare Other | Admitting: Family

## 2021-08-08 VITALS — BP 131/68 | HR 52 | Temp 97.5°F | Ht 61.0 in | Wt 153.6 lb

## 2021-08-08 DIAGNOSIS — E559 Vitamin D deficiency, unspecified: Secondary | ICD-10-CM | POA: Diagnosis not present

## 2021-08-08 DIAGNOSIS — I152 Hypertension secondary to endocrine disorders: Secondary | ICD-10-CM

## 2021-08-08 DIAGNOSIS — T451X5A Adverse effect of antineoplastic and immunosuppressive drugs, initial encounter: Secondary | ICD-10-CM

## 2021-08-08 DIAGNOSIS — E538 Deficiency of other specified B group vitamins: Secondary | ICD-10-CM | POA: Diagnosis not present

## 2021-08-08 DIAGNOSIS — D6481 Anemia due to antineoplastic chemotherapy: Secondary | ICD-10-CM | POA: Diagnosis not present

## 2021-08-08 DIAGNOSIS — E039 Hypothyroidism, unspecified: Secondary | ICD-10-CM | POA: Diagnosis not present

## 2021-08-08 DIAGNOSIS — K7460 Unspecified cirrhosis of liver: Secondary | ICD-10-CM | POA: Diagnosis not present

## 2021-08-08 DIAGNOSIS — E1169 Type 2 diabetes mellitus with other specified complication: Secondary | ICD-10-CM | POA: Diagnosis not present

## 2021-08-08 DIAGNOSIS — E785 Hyperlipidemia, unspecified: Secondary | ICD-10-CM

## 2021-08-08 DIAGNOSIS — E1159 Type 2 diabetes mellitus with other circulatory complications: Secondary | ICD-10-CM

## 2021-08-08 DIAGNOSIS — E663 Overweight: Secondary | ICD-10-CM

## 2021-08-08 DIAGNOSIS — A09 Infectious gastroenteritis and colitis, unspecified: Secondary | ICD-10-CM | POA: Diagnosis not present

## 2021-08-08 DIAGNOSIS — B3731 Acute candidiasis of vulva and vagina: Secondary | ICD-10-CM | POA: Diagnosis not present

## 2021-08-08 LAB — BAYER DCA HB A1C WAIVED: HB A1C (BAYER DCA - WAIVED): 6.9 % — ABNORMAL HIGH (ref 4.8–5.6)

## 2021-08-08 MED ORDER — FLUCONAZOLE 150 MG PO TABS: 150.0000 mg | ORAL_TABLET | ORAL | 0 refills | Status: DC

## 2021-08-08 NOTE — Patient Instructions (Signed)
Clostridioides Difficile Infection Clostridioides difficile infection, also known as C. difficile or C. diff infection, happens when too much C. diff bacteria grows. This can cause severe diarrhea and inflammation of the colon (colitis). It is linked to recent use of antibiotic medicine. This infection can be passed from person to person (is contagious). You also may be exposed to the bacteria from contact with food, water, or surfaces that have the bacteria on them. What are the causes? Certain bacteria live in the colon and help to digest food. This infection develops when the balance of helpful bacteria in the colon changes and the C. diff bacteria grow out of control. This is often caused by taking antibiotics. What increases the risk? You may be more likely to develop this condition if you: Take certain antibiotics that kill many types of bacteria or take antibiotics for a long time. Have an extended stay in a hospital or long-term care facility. Are older than age 41. Have had a C. diff infection before or have been exposed to C. diff bacteria. Have a weakened disease-fighting system (immune system). Take a medicine to reduce stomach acid, such as a proton pump inhibitor, for a long time. Have a serious underlying condition, such as colon cancer or inflammatory bowel disease (IBD). Have had a gastrointestinal (GI) tract procedure. What are the signs or symptoms? Symptoms of this condition include: Diarrhea (three or more times a day) for several days. Fever. Loss of appetite. Nausea. Swelling, pain, cramping, or tenderness in the abdomen. How is this diagnosed? This condition is diagnosed with: Your medical history and a physical exam. Tests, which may include: A test for C. diff in your stool (feces). Blood tests. Imaging tests, such as a CT scan of your abdomen. A procedure in which your colon is examined. This is rare. How is this treated? Treatment for this condition may  include: Stopping the antibiotics that you were taking when the C. diff infection began. Do this only as told by your health care provider. Taking certain antibiotics to stop C. diff growth. Taking stool from a healthy person and placing it into your colon (fecal transplant). This may be done if the infection keeps coming back. Having surgery to remove the infected part of the colon. This is rare. Follow these instructions at home: Medicines Take over-the-counter and prescription medicines only as told by your health care provider. Take antibiotic medicine as told by your health care provider. Do not stop taking the antibiotic even if you start to feel better. Do not treat diarrhea with medicines unless your health care provider tells you to. Eating and drinking  Follow instructions from your health care provider about eating and drinking restrictions. Eat bland foods in small amounts that are easy to digest. These include bananas, applesauce, rice, lean meats, toast, and crackers. Follow instructions on replacing body fluid that has been lost (rehydrate). This may include: Drinking clear fluids, such as water, clear fruit juice that is diluted with water, and low-calorie sports drinks. Sucking on ice chips. Taking an oral rehydration solution (ORS). This drink is sold at pharmacies and retail stores. Avoid milk, caffeine, and alcohol. Drink enough fluid to keep your urine pale yellow. General instructions Wash your hands often with soap and water for at least 20 seconds. Bathe or shower using soap and water daily. Return to your normal activities as told by your health care provider. Ask your health care provider what activities are safe for you. Be sure your home is  clean before you leave the hospital or clinic to go home. Then continue daily cleaning for at least a week. Keep all follow-up visits. This is important. How is this prevented? Hand hygiene  Wash your hands with soap and  water for at least 20 seconds before preparing food and after using the bathroom. Make sure the people you live with also wash their hands often with soap and water for at least 20 seconds. If you are being treated at a hospital or clinic, make sure that all health care providers and visitors wash their hands with soap and water before touching you. Contact precautions Tell your health care team right away if you develop diarrhea while in a hospital or long-term care facility. When visiting someone in a hospital or a long-term care facility, follow guidelines for wearing a gown, gloves, or other protective equipment. If possible, avoid contact with people who have diarrhea. Use a separate bathroom if you are sick and live with other people, if possible. Clean environment Clean surfaces that are touched often every day. C. diff bacteria are killed only by cleaning products that contain 10% chlorine bleach solution. Be sure to: Read the product's label to make sure the product will kill the bacteria on the surface you are cleaning. Clean frequently touched surfaces, such as toilet seats and flush handles, bathtubs, sinks, doorknobs, and work surfaces. If you are in the hospital, make sure that staff members clean the surfaces in your room daily. Let a staff person know right away if body fluids have splashed or spilled. Washing clothes and linens Use a powder laundry detergent containing chlorine bleach instead of liquid detergent. Powder detergents contain chlorine bleach in low levels to help kill bacteria. Run your empty washing machine on the hot setting once a month with enough detergent for a full load. This will kill any remaining C. diff bacteria. Contact a health care provider if: Your symptoms do not get better, or they get worse, even with treatment. Your symptoms go away and then come back. You have a fever. You develop new symptoms. Get help right away if: You have more pain or  tenderness in your abdomen. You have stool that is mostly bloody, or looks black and tarry. You cannot eat or drink without vomiting. You have signs of dehydration, such as: Dark urine, very little urine, or no urine. Cracked lips or dry mouth. Not making tears when you cry. Sunken eyes. Sleepiness. Weakness or dizziness. Summary Clostridioides difficile infection, or C. diff infection, can cause severe diarrhea and inflammation of the colon (colitis). It is linked to recent antibiotic use. C. diff infection can spread from person to person (is contagious). You also may be exposed to the bacteria from contact with food, water, or surfaces that have the bacteria on them. This infection may be treated by stopping the antibiotics you were using when the infection began. Fecal transplant or surgery may be needed for repeat or severe infections. Washing hands with soap and water for at least 20 seconds after you use the bathroom and before you eat, and cleaning surfaces with a 10% bleach solution, can help prevent or limit spread of this infection. This information is not intended to replace advice given to you by your health care provider. Make sure you discuss any questions you have with your health care provider. Document Revised: 10/07/2019 Document Reviewed: 10/07/2019 Elsevier Patient Education  [place].

## 2021-08-08 NOTE — Progress Notes (Signed)
Subjective:    Patient ID: Alexandria Irwin, female    DOB: Aug 18, 1945, 76 y.o.   MRN: [id number]  Chief Complaint  Patient presents with   Medical Management of Chronic Issues   Pt presents to the office today for chronic follow up. 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 She has completed chemo and radiation. 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   She is currently being treated for C Diff and taking Vancomycin. She is having yeast infection. Reports since starting the vancomycin her symptoms has greatly improved.  Hypertension This is a chronic problem. The current episode started more than 1 year ago. The problem has been resolved since onset. The problem is controlled. Pertinent negatives include no blurred vision, malaise/fatigue, peripheral edema or shortness of breath. Risk factors for coronary artery disease include dyslipidemia, diabetes mellitus, obesity and sedentary lifestyle. The current treatment provides moderate improvement. Identifiable causes of hypertension include a thyroid problem.  Thyroid Problem Presents for follow-up visit. Symptoms include diarrhea. Patient reports no depressed mood, dry skin or hoarse voice. The symptoms have been stable. Her past medical history is significant for hyperlipidemia.  Hyperlipidemia This is a chronic problem. The current episode started more than 1 year ago. The problem is controlled. Recent lipid tests were reviewed and are normal. Exacerbating diseases include obesity. Pertinent negatives include no shortness of breath. Current antihyperlipidemic treatment includes statins. The current treatment provides moderate improvement of lipids. Risk factors for coronary artery disease include dyslipidemia, diabetes mellitus, hypertension, a sedentary lifestyle and post-menopausal.  Anemia Presents for follow-up visit. There has been no bruising/bleeding  easily or malaise/fatigue.  Diabetes She presents for her follow-up diabetic visit. She has type 2 diabetes mellitus. Associated symptoms include foot paresthesias. Pertinent negatives for diabetes include no blurred vision. Symptoms are stable. Diabetic complications include peripheral neuropathy. Pertinent negatives for diabetic complications include no nephropathy. Risk factors for coronary artery disease include dyslipidemia, diabetes mellitus, hypertension, sedentary lifestyle and post-menopausal. She is following a generally healthy diet. Her overall blood glucose range is 130-140 mg/dl.  Vaginal Itching The patient's primary symptoms include genital itching. The patient's pertinent negatives include no vaginal discharge. This is a chronic problem. The current episode started more than 1 year ago. The problem occurs intermittently. The problem has been waxing and waning. Associated symptoms include diarrhea.     Review of Systems  Constitutional:  Negative for malaise/fatigue.  HENT:  Negative for hoarse voice.   Eyes:  Negative for blurred vision.  Respiratory:  Negative for shortness of breath.   Gastrointestinal:  Positive for diarrhea.  Genitourinary:  Negative for vaginal discharge.  Hematological:  Does not bruise/bleed easily.  All other systems reviewed and are negative.     Objective:   Physical Exam Vitals reviewed.  Constitutional:      General: She is not in acute distress.    Appearance: She is well-developed. She is obese.  HENT:     Head: Normocephalic and atraumatic.     Right Ear: Tympanic membrane normal.     Left Ear: Tympanic membrane normal.  Eyes:     Pupils: Pupils are equal, round, and reactive to light.  Neck:     Thyroid: No thyromegaly.  Cardiovascular:     Rate and Rhythm: Normal rate and regular rhythm.     Heart sounds: Normal heart sounds. No murmur heard. Pulmonary:     Effort: Pulmonary effort is  normal. No respiratory distress.     Breath  sounds: Normal breath sounds. No wheezing.  Abdominal:     General: Bowel sounds are normal. There is no distension.     Palpations: Abdomen is soft.     Tenderness: There is no abdominal tenderness.  Musculoskeletal:        General: No tenderness. Normal range of motion.     Cervical back: Normal range of motion and neck supple.  Skin:    General: Skin is warm and dry.  Neurological:     Mental Status: She is alert and oriented to person, place, and time.     Cranial Nerves: No cranial nerve deficit.     Deep Tendon Reflexes: Reflexes are normal and symmetric.  Psychiatric:        Behavior: Behavior normal.        Thought Content: Thought content normal.        Judgment: Judgment normal.      BP 131/68    Pulse (!) 52    Temp (!) 97.5 F (36.4 C) (Oral)    Ht '5\' 1"'  (1.549 m)    Wt 153 lb 9.6 oz (69.7 kg)    BMI 29.02 kg/m      Assessment & Plan:  Alexandria Irwin comes in today with chief complaint of Medical Management of Chronic Issues   Diagnosis and orders addressed:  1. Hypertension associated with diabetes ([place]) - CMP14+EGFR - CBC with Differential/Platelet  2. Type 2 diabetes mellitus with other specified complication, without long-term current use of insulin ([facility]) - Bayer DCA Hb A1c Waived - CMP14+EGFR - CBC with Differential/Platelet  3. Hyperlipidemia associated with type 2 diabetes mellitus ([facility]) - CMP14+EGFR - CBC with Differential/Platelet  4. Hypothyroidism, unspecified type - CMP14+EGFR - CBC with Differential/Platelet - TSH  5. Cirrhosis of liver without ascites, unspecified hepatic cirrhosis type ([facility]) - CMP14+EGFR - CBC with Differential/Platelet  6. Vitamin D deficiency - CMP14+EGFR - CBC with Differential/Platelet  7. Overweight (BMI 25.0-29.9) - CMP14+EGFR - CBC with Differential/Platelet  8. Anemia due to antineoplastic chemotherapy - CMP14+EGFR - CBC with Differential/Platelet  9. B12 deficiency - CMP14+EGFR - CBC with  Differential/Platelet  10. Vagina, candidiasis Start diflucan  - fluconazole (DIFLUCAN) 150 MG tablet; Take 1 tablet (150 mg total) by mouth every 3 (three) days.  Dispense: 3 tablet; Refill: 0 - CMP14+EGFR   Labs pending Health Maintenance reviewed Diet and exercise encouraged  Follow up plan: 3 months    Evelina Dun, FNP

## 2021-08-09 ENCOUNTER — Other Ambulatory Visit: Payer: Self-pay | Admitting: Family

## 2021-08-09 ENCOUNTER — Other Ambulatory Visit: Payer: Self-pay

## 2021-08-09 DIAGNOSIS — M79676 Pain in unspecified toe(s): Secondary | ICD-10-CM | POA: Diagnosis not present

## 2021-08-09 DIAGNOSIS — B351 Tinea unguium: Secondary | ICD-10-CM | POA: Diagnosis not present

## 2021-08-09 DIAGNOSIS — E1142 Type 2 diabetes mellitus with diabetic polyneuropathy: Secondary | ICD-10-CM | POA: Diagnosis not present

## 2021-08-09 DIAGNOSIS — E119 Type 2 diabetes mellitus without complications: Secondary | ICD-10-CM

## 2021-08-09 DIAGNOSIS — L84 Corns and callosities: Secondary | ICD-10-CM | POA: Diagnosis not present

## 2021-08-09 DIAGNOSIS — E875 Hyperkalemia: Secondary | ICD-10-CM

## 2021-08-09 LAB — CMP14+EGFR
ALT: 24 IU/L (ref 0–32)
AST: 31 IU/L (ref 0–40)
Albumin/Globulin Ratio: 2 (ref 1.2–2.2)
Albumin: 4.7 g/dL (ref 3.7–4.7)
Alkaline Phosphatase: 43 IU/L — ABNORMAL LOW (ref 44–121)
BUN/Creatinine Ratio: 16 (ref 12–28)
BUN: 22 mg/dL (ref 8–27)
Bilirubin Total: 0.8 mg/dL (ref 0.0–1.2)
CO2: 23 mmol/L (ref 20–29)
Calcium: 10.1 mg/dL (ref 8.7–10.3)
Chloride: 100 mmol/L (ref 96–106)
Creatinine, Ser: 1.38 mg/dL — ABNORMAL HIGH (ref 0.57–1.00)
Globulin, Total: 2.3 g/dL (ref 1.5–4.5)
Glucose: 87 mg/dL (ref 70–99)
Potassium: 5.8 mmol/L — ABNORMAL HIGH (ref 3.5–5.2)
Sodium: 141 mmol/L (ref 134–144)
Total Protein: 7 g/dL (ref 6.0–8.5)
eGFR: 39 mL/min/{1.73_m2} — ABNORMAL LOW (ref >59)

## 2021-08-09 LAB — CBC WITH DIFFERENTIAL/PLATELET
Basophils Absolute: 0 10*3/uL (ref 0.0–0.2)
Basos: 1 %
EOS (ABSOLUTE): 0.1 10*3/uL (ref 0.0–0.4)
Eos: 2 %
Hematocrit: 41.4 % (ref 34.0–46.6)
Hemoglobin: 14.2 g/dL (ref 11.1–15.9)
Immature Grans (Abs): 0 10*3/uL (ref 0.0–0.1)
Immature Granulocytes: 0 %
Lymphocytes Absolute: 2.5 10*3/uL (ref 0.7–3.1)
Lymphs: 43 %
MCH: 32.9 pg (ref 26.6–33.0)
MCHC: 34.3 g/dL (ref 31.5–35.7)
MCV: 96 fL (ref 79–97)
Monocytes Absolute: 0.4 10*3/uL (ref 0.1–0.9)
Monocytes: 6 %
Neutrophils Absolute: 2.7 10*3/uL (ref 1.4–7.0)
Neutrophils: 48 %
Platelets: 185 10*3/uL (ref 150–450)
RBC: 4.32 x10E6/uL (ref 3.77–5.28)
RDW: 13.2 % (ref 11.7–15.4)
WBC: 5.7 10*3/uL (ref 3.4–10.8)

## 2021-08-09 LAB — TSH: TSH: 1.04 u[IU]/mL (ref 0.450–4.500)

## 2021-08-09 MED ORDER — ONETOUCH DELICA PLUS LANCET33G MISC: 1.0000 | Freq: Every day | 8 refills | Status: DC

## 2021-08-09 MED ORDER — ONETOUCH VERIO VI STRP: ORAL_STRIP | 3 refills | Status: DC

## 2021-08-09 MED ORDER — SODIUM POLYSTYRENE SULFONATE 15 GM/60ML PO SUSP: 45.0000 g | Freq: Once | ORAL | 0 refills | Status: AC

## 2021-08-13 ENCOUNTER — Other Ambulatory Visit: Payer: Medicare Other

## 2021-08-13 ENCOUNTER — Other Ambulatory Visit: Payer: Self-pay | Admitting: Family

## 2021-08-13 DIAGNOSIS — E875 Hyperkalemia: Secondary | ICD-10-CM | POA: Diagnosis not present

## 2021-08-13 DIAGNOSIS — E1169 Type 2 diabetes mellitus with other specified complication: Secondary | ICD-10-CM

## 2021-08-13 DIAGNOSIS — E559 Vitamin D deficiency, unspecified: Secondary | ICD-10-CM

## 2021-08-13 LAB — BMP8+EGFR
BUN/Creatinine Ratio: 12 (ref 12–28)
BUN: 15 mg/dL (ref 8–27)
CO2: 21 mmol/L (ref 20–29)
Calcium: 10 mg/dL (ref 8.7–10.3)
Chloride: 105 mmol/L (ref 96–106)
Creatinine, Ser: 1.21 mg/dL — ABNORMAL HIGH (ref 0.57–1.00)
Glucose: 145 mg/dL — ABNORMAL HIGH (ref 70–99)
Potassium: 5.5 mmol/L — ABNORMAL HIGH (ref 3.5–5.2)
Sodium: 146 mmol/L — ABNORMAL HIGH (ref 134–144)
eGFR: 46 mL/min/{1.73_m2} — ABNORMAL LOW (ref >59)

## 2021-08-13 MED ORDER — VITAMIN D (ERGOCALCIFEROL) 1.25 MG (50000 UNIT) PO CAPS: 50000.0000 [IU] | ORAL_CAPSULE | ORAL | 0 refills | Status: DC

## 2021-08-13 MED ORDER — METFORMIN HCL ER 500 MG PO TB24: 1000.0000 mg | ORAL_TABLET | Freq: Every day | ORAL | 0 refills | Status: DC

## 2021-08-14 ENCOUNTER — Other Ambulatory Visit: Payer: Self-pay | Admitting: Family

## 2021-08-14 LAB — CDIFF NAA+O+P+STOOL CULTURE
E coli, Shiga toxin Assay: NEGATIVE
Toxigenic C. Difficile by PCR: NEGATIVE

## 2021-08-14 MED ORDER — SODIUM POLYSTYRENE SULFONATE PO POWD: Freq: Once | ORAL | 0 refills | Status: DC

## 2021-08-15 ENCOUNTER — Other Ambulatory Visit: Payer: Self-pay | Admitting: Family

## 2021-08-15 DIAGNOSIS — E1169 Type 2 diabetes mellitus with other specified complication: Secondary | ICD-10-CM

## 2021-08-15 DIAGNOSIS — E1159 Type 2 diabetes mellitus with other circulatory complications: Secondary | ICD-10-CM

## 2021-08-16 ENCOUNTER — Ambulatory Visit: Payer: Medicare Other | Admitting: Family

## 2021-08-17 ENCOUNTER — Other Ambulatory Visit: Payer: Self-pay | Admitting: Family Medicine

## 2021-08-17 MED ORDER — AMLODIPINE BESYLATE 5 MG PO TABS: 5.0000 mg | ORAL_TABLET | Freq: Every day | ORAL | 2 refills | Status: DC

## 2021-08-20 ENCOUNTER — Other Ambulatory Visit: Payer: Self-pay | Admitting: Family

## 2021-08-20 DIAGNOSIS — E1159 Type 2 diabetes mellitus with other circulatory complications: Secondary | ICD-10-CM

## 2021-08-21 ENCOUNTER — Ambulatory Visit (INDEPENDENT_AMBULATORY_CARE_PROVIDER_SITE_OTHER): Payer: Medicare Other | Admitting: Family

## 2021-08-21 ENCOUNTER — Encounter: Payer: Self-pay | Admitting: Family

## 2021-08-21 VITALS — BP 103/55 | HR 59 | Temp 97.8°F | Ht 61.0 in | Wt 153.0 lb

## 2021-08-21 DIAGNOSIS — R5383 Other fatigue: Secondary | ICD-10-CM

## 2021-08-21 DIAGNOSIS — E1159 Type 2 diabetes mellitus with other circulatory complications: Secondary | ICD-10-CM

## 2021-08-21 DIAGNOSIS — E875 Hyperkalemia: Secondary | ICD-10-CM | POA: Diagnosis not present

## 2021-08-21 DIAGNOSIS — R197 Diarrhea, unspecified: Secondary | ICD-10-CM | POA: Diagnosis not present

## 2021-08-21 DIAGNOSIS — I152 Hypertension secondary to endocrine disorders: Secondary | ICD-10-CM | POA: Diagnosis not present

## 2021-08-21 NOTE — Progress Notes (Signed)
Subjective:    Patient ID: Alexandria Irwin, female    DOB: 07-24-1945, 76 y.o.   MRN: [id number]  Chief Complaint  Patient presents with   Follow-up   Pt presents to the office today to recheck hyperkalemia. She was seen on 08/02/21 with diarrhea and found to have an elevated K+. She was then reseen on 08/08/21 and her K+ was 5.8 and was given Kayexalate. She then returned on 08/13/21 and her K+ was 5.5. We stopped her losartan and started her on Norvasc 5 mg. She then took Kayexalate again.    Her BP is at goal. She reports she is feeling much better. She also stopped her oral potassium she was taking at home.  Hypertension This is a chronic problem. The current episode started more than 1 year ago. The problem has been resolved since onset. The problem is controlled. Associated symptoms include malaise/fatigue. Pertinent negatives include no peripheral edema or shortness of breath. Risk factors for coronary artery disease include dyslipidemia, obesity and sedentary lifestyle. Past treatments include calcium channel blockers. The current treatment provides moderate improvement.     Review of Systems  Constitutional:  Positive for malaise/fatigue.  Respiratory:  Negative for shortness of breath.   All other systems reviewed and are negative.     Objective:   Physical Exam Vitals reviewed.  Constitutional:      General: She is not in acute distress.    Appearance: She is well-developed.  HENT:     Head: Normocephalic and atraumatic.     Right Ear: Tympanic membrane normal.     Left Ear: Tympanic membrane normal.  Eyes:     Pupils: Pupils are equal, round, and reactive to light.  Neck:     Thyroid: No thyromegaly.  Cardiovascular:     Rate and Rhythm: Normal rate and regular rhythm.     Heart sounds: Normal heart sounds. No murmur heard. Pulmonary:     Effort: Pulmonary effort is normal. No respiratory distress.     Breath sounds: Normal breath sounds. No wheezing.  Abdominal:      General: Bowel sounds are normal. There is no distension.     Palpations: Abdomen is soft.     Tenderness: There is no abdominal tenderness.  Musculoskeletal:        General: No tenderness. Normal range of motion.     Cervical back: Normal range of motion and neck supple.  Skin:    General: Skin is warm and dry.  Neurological:     Mental Status: She is alert and oriented to person, place, and time.     Cranial Nerves: No cranial nerve deficit.     Deep Tendon Reflexes: Reflexes are normal and symmetric.  Psychiatric:        Behavior: Behavior normal.        Thought Content: Thought content normal.        Judgment: Judgment normal.     BP (!) 103/55    Pulse (!) 59    Temp 97.8 F (36.6 C) (Oral)    Ht '5\' 1"'  (1.549 m)    Wt 153 lb (69.4 kg)    BMI 28.91 kg/m      Assessment & Plan:  Alexandria Irwin comes in today with chief complaint of Follow-up   Diagnosis and orders addressed:  1. Hypertension associated with diabetes ([place]) At goal Continue medications - BMP8+EGFR  2. Hyperkalemia Labs pending Continue to hold OTC k+ Losartan stopped - BMP8+EGFR  3.  Other fatigue - BMP8+EGFR  4. Diarrhea, unspecified type Improved  - BMP8+EGFR   Labs pending Health Maintenance reviewed Diet and exercise encouraged  Follow up plan: Keep chronic follow up   Evelina Dun, FNP

## 2021-08-21 NOTE — Patient Instructions (Signed)
Hyperkalemia °Hyperkalemia occurs when the level of potassium in your blood is too high. Potassium is an important nutrient that helps the muscles and nerves function normally. It affects how the heart works, and it helps keep fluids and minerals balanced in the body. If there is too much potassium in your blood, it can affect your heart's ability to function normally. °Potassium is normally removed (excreted) from the body by the kidneys. Hyperkalemia can result from various conditions. It can range from mild to severe. °What are the causes? °This condition may be caused by: °Taking in too much potassium. You can do this by: °Using salt substitutes. They contain large amounts of potassium. °Taking potassium supplements. °Eating foods that are high in potassium. °Excreting too little potassium. This can happen if: °Your kidneys are not working properly. Kidney (renal) disease, including short-term or long-term renal failure, is a common cause of hyperkalemia. °You are taking medicines that lower your excretion of potassium. °You have Addison's disease. °You have a urinary tract blockage, such as kidney stones. °You are on treatment to mechanically clean your blood (dialysis) and you skip a treatment. °Releasing a high amount of potassium from your cells into your blood. This can happen with: °Injury to muscles (rhabdomyolysis) or other tissues. Most potassium is stored in your muscles. °Severe burns or infections. °Acidic blood plasma (acidosis). Acidosis can result from many diseases, such as uncontrolled diabetes. °What increases the risk? °The following factors may make you more likely to develop this condition: °Kidney disease. This puts you at the highest risk. °Addison's disease. This is a condition where the adrenal glands do not produce enough hormones. °Alcoholism or heavy drug use. °Using certain blood pressure medicines, such as ACE inhibitors, angiotensin II receptor blockers (ARBs), or potassium-sparing  diuretics such as spironolactone. °Severe injury or burn. °What are the signs or symptoms? °In many cases, there are no symptoms. However, when your potassium level becomes high enough, you may have symptoms such as: °An irregular or very slow heartbeat. °Nausea. °Tiredness (fatigue). °Confusion. °Tingling of your skin or numbness of your hands or feet. °Muscle cramps. °Muscle weakness. °Not being able to move (paralysis). °How is this diagnosed? °This condition may be diagnosed based on: °Your symptoms and medical history. Your health care provider will ask about your use of prescription and non-prescription drugs. °A physical exam. °Blood tests. °An electrocardiogram (ECG). °How is this treated? °Treatment depends on the cause and severity of your condition. Treatment may need to be done in the hospital setting. Treatment may include: °IV glucose (sugar) along with insulin to shift potassium out of your blood and into your cells. °A medicine called albuterol to shift potassium out of your blood and into your cells. °Medicines to remove the potassium from your body. °Dialysis to remove the potassium from your body. °Calcium to protect your heart from the effects of high potassium, such as irregular rhythms (arrhythmias). °Follow these instructions at home: ° °Take over-the-counter and prescription medicines only as told by your health care provider. °Do not take any supplements, natural products, herbs, or vitamins without reviewing them with your health care provider. Certain supplements and natural food products contain high amounts of potassium. °Limit your alcohol intake as told by your health care provider. °Do not use drugs. If you need help quitting, ask your health care provider. °If you have kidney disease, you may need to follow a low-potassium diet. A dietitian can help you learn which foods have high or low amounts of potassium. °Keep   all follow-up visits as told by your health care provider. This is  important. °Contact a health care provider if you: °Have an irregular or very slow heartbeat. °Feel light-headed. °Feel weak. °Are nauseous. °Have tingling or numbness in your hands or feet. °Get help right away if you: °Have shortness of breath. °Have chest pain or discomfort. °Pass out. °Have muscle paralysis. °Summary °Hyperkalemia occurs when the level of potassium in your blood is too high. °This condition may be caused by taking in too much potassium, excreting too little potassium, or releasing a high amount of potassium from your cells into your blood. °Hyperkalemia can result from many underlying conditions, especially chronic kidney disease, or from taking certain medicines. °Treatment of hyperkalemia may include medicine to shift potassium out of your blood and into your cells or to remove the potassium from your body. °If you have kidney disease, you may need to follow a low-potassium diet. A dietitian can help you learn which foods have high or low amounts of potassium. °This information is not intended to replace advice given to you by your health care provider. Make sure you discuss any questions you have with your health care provider. °Document Revised: 10/11/2019 Document Reviewed: 11/18/2019 °Elsevier Patient Education © 2022 [employer] ° °

## 2021-08-22 LAB — BMP8+EGFR
BUN/Creatinine Ratio: 17 (ref 12–28)
BUN: 19 mg/dL (ref 8–27)
CO2: 22 mmol/L (ref 20–29)
Calcium: 9.9 mg/dL (ref 8.7–10.3)
Chloride: 107 mmol/L — ABNORMAL HIGH (ref 96–106)
Creatinine, Ser: 1.1 mg/dL — ABNORMAL HIGH (ref 0.57–1.00)
Glucose: 98 mg/dL (ref 70–99)
Potassium: 5 mmol/L (ref 3.5–5.2)
Sodium: 146 mmol/L — ABNORMAL HIGH (ref 134–144)
eGFR: 51 mL/min/{1.73_m2} — ABNORMAL LOW (ref >59)

## 2021-09-16 ENCOUNTER — Other Ambulatory Visit: Payer: Self-pay | Admitting: Family

## 2021-09-16 DIAGNOSIS — R42 Dizziness and giddiness: Secondary | ICD-10-CM

## 2021-09-17 ENCOUNTER — Other Ambulatory Visit: Payer: Self-pay | Admitting: Family

## 2021-09-17 DIAGNOSIS — I152 Hypertension secondary to endocrine disorders: Secondary | ICD-10-CM

## 2021-10-10 ENCOUNTER — Other Ambulatory Visit: Payer: Self-pay | Admitting: Family

## 2021-10-17 ENCOUNTER — Other Ambulatory Visit: Payer: Self-pay | Admitting: Family

## 2021-10-18 DIAGNOSIS — E1142 Type 2 diabetes mellitus with diabetic polyneuropathy: Secondary | ICD-10-CM | POA: Diagnosis not present

## 2021-10-18 DIAGNOSIS — M79676 Pain in unspecified toe(s): Secondary | ICD-10-CM | POA: Diagnosis not present

## 2021-10-18 DIAGNOSIS — B351 Tinea unguium: Secondary | ICD-10-CM | POA: Diagnosis not present

## 2021-10-18 DIAGNOSIS — L84 Corns and callosities: Secondary | ICD-10-CM | POA: Diagnosis not present

## 2021-11-01 ENCOUNTER — Other Ambulatory Visit: Payer: Self-pay | Admitting: Family

## 2021-11-01 DIAGNOSIS — E559 Vitamin D deficiency, unspecified: Secondary | ICD-10-CM

## 2021-11-06 ENCOUNTER — Encounter: Payer: Self-pay | Admitting: Family

## 2021-11-06 ENCOUNTER — Other Ambulatory Visit: Payer: Self-pay | Admitting: Family

## 2021-11-06 ENCOUNTER — Ambulatory Visit (INDEPENDENT_AMBULATORY_CARE_PROVIDER_SITE_OTHER): Payer: Medicare Other | Admitting: Family

## 2021-11-06 VITALS — BP 124/64 | HR 67 | Wt 156.0 lb

## 2021-11-06 DIAGNOSIS — T451X5A Adverse effect of antineoplastic and immunosuppressive drugs, initial encounter: Secondary | ICD-10-CM | POA: Diagnosis not present

## 2021-11-06 DIAGNOSIS — E1169 Type 2 diabetes mellitus with other specified complication: Secondary | ICD-10-CM | POA: Diagnosis not present

## 2021-11-06 DIAGNOSIS — E039 Hypothyroidism, unspecified: Secondary | ICD-10-CM

## 2021-11-06 DIAGNOSIS — E785 Hyperlipidemia, unspecified: Secondary | ICD-10-CM

## 2021-11-06 DIAGNOSIS — K7460 Unspecified cirrhosis of liver: Secondary | ICD-10-CM | POA: Diagnosis not present

## 2021-11-06 DIAGNOSIS — Z23 Encounter for immunization: Secondary | ICD-10-CM | POA: Diagnosis not present

## 2021-11-06 DIAGNOSIS — E538 Deficiency of other specified B group vitamins: Secondary | ICD-10-CM

## 2021-11-06 DIAGNOSIS — E663 Overweight: Secondary | ICD-10-CM | POA: Diagnosis not present

## 2021-11-06 DIAGNOSIS — I152 Hypertension secondary to endocrine disorders: Secondary | ICD-10-CM

## 2021-11-06 DIAGNOSIS — R42 Dizziness and giddiness: Secondary | ICD-10-CM

## 2021-11-06 DIAGNOSIS — C541 Malignant neoplasm of endometrium: Secondary | ICD-10-CM | POA: Diagnosis not present

## 2021-11-06 DIAGNOSIS — G620 Drug-induced polyneuropathy: Secondary | ICD-10-CM | POA: Diagnosis not present

## 2021-11-06 DIAGNOSIS — E1159 Type 2 diabetes mellitus with other circulatory complications: Secondary | ICD-10-CM | POA: Diagnosis not present

## 2021-11-06 DIAGNOSIS — E559 Vitamin D deficiency, unspecified: Secondary | ICD-10-CM

## 2021-11-06 LAB — BAYER DCA HB A1C WAIVED: HB A1C (BAYER DCA - WAIVED): 6.4 % — ABNORMAL HIGH (ref 4.8–5.6)

## 2021-11-06 NOTE — Progress Notes (Signed)
? ?Subjective:  ? ? Patient ID: Alexandria Irwin, female    DOB: [birth year], 76 y.o.   MRN: [id number] ? ?Chief Complaint  ?Patient presents with  ? Medical Management of Chronic Issues  ? Hyperlipidemia  ? Hypertension  ? Diabetes  ? ?Pt presents to the office today for chronic follow up. 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 She has completed chemo and radiation. 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  ? ?Hyperlipidemia ?This is a chronic problem. The current episode started more than 1 year ago. The problem is controlled. Recent lipid tests were reviewed and are normal. Exacerbating diseases include obesity. Pertinent negatives include no shortness of breath. Current antihyperlipidemic treatment includes statins. The current treatment provides moderate improvement of lipids. Risk factors for coronary artery disease include hypertension, a sedentary lifestyle, post-menopausal, obesity and dyslipidemia.  ?Hypertension ?This is a chronic problem. The current episode started more than 1 year ago. The problem has been resolved since onset. Associated symptoms include malaise/fatigue. Pertinent negatives include no blurred vision, peripheral edema or shortness of breath. Risk factors for coronary artery disease include dyslipidemia, obesity and sedentary lifestyle. The current treatment provides moderate improvement. There is no history of CVA or heart failure. Identifiable causes of hypertension include a thyroid problem.  ?Diabetes ?She presents for her follow-up diabetic visit. She has type 2 diabetes mellitus. Hypoglycemia symptoms include nervousness/anxiousness. Associated symptoms include fatigue. Pertinent negatives for diabetes include no blurred vision and no foot paresthesias. There are no hypoglycemic complications. Symptoms are stable. Pertinent negatives for diabetic complications include no CVA or peripheral  neuropathy. Risk factors for coronary artery disease include dyslipidemia, diabetes mellitus, sedentary lifestyle and post-menopausal. She is following a generally unhealthy diet. Her overall blood glucose range is 140-180 mg/dl. Eye exam is current.  ?Thyroid Problem ?Presents for follow-up visit. Symptoms include anxiety, depressed mood, fatigue and hoarse voice. The symptoms have been stable. Her past medical history is significant for hyperlipidemia. There is no history of heart failure.  ? ? ? ?Review of Systems  ?Constitutional:  Positive for fatigue and malaise/fatigue.  ?HENT:  Positive for hoarse voice.   ?Eyes:  Negative for blurred vision.  ?Respiratory:  Negative for shortness of breath.   ?Psychiatric/Behavioral:  The patient is nervous/anxious.   ?All other systems reviewed and are negative. ? ?   ?Objective:  ? Physical Exam ?Vitals reviewed.  ?Constitutional:   ?   General: She is not in acute distress. ?   Appearance: She is well-developed. She is obese.  ?HENT:  ?   Head: Normocephalic and atraumatic.  ?   Right Ear: Tympanic membrane normal.  ?   Left Ear: Tympanic membrane normal.  ?Eyes:  ?   Pupils: Pupils are equal, round, and reactive to light.  ?Neck:  ?   Thyroid: No thyromegaly.  ?Cardiovascular:  ?   Rate and Rhythm: Normal rate and regular rhythm.  ?   Heart sounds: Normal heart sounds. No murmur heard. ?Pulmonary:  ?   Effort: Pulmonary effort is normal. No respiratory distress.  ?   Breath sounds: Normal breath sounds. No wheezing.  ?Abdominal:  ?   General: Bowel sounds are normal. There is no distension.  ?   Palpations: Abdomen is soft.  ?   Tenderness: There is no abdominal tenderness.  ?Musculoskeletal:     ?   General: No tenderness. Normal range of motion.  ?  Cervical back: Normal range of motion and neck supple.  ?   Right lower leg: Edema (2+) present.  ?   Left lower leg: Edema (2+) present.  ?Skin: ?   General: Skin is warm and dry.  ?Neurological:  ?   Mental Status: She  is alert and oriented to person, place, and time.  ?   Cranial Nerves: No cranial nerve deficit.  ?   Deep Tendon Reflexes: Reflexes are normal and symmetric.  ?Psychiatric:     ?   Behavior: Behavior normal.     ?   Thought Content: Thought content normal.     ?   Judgment: Judgment normal.  ? ? ? ? ?BP 124/64   Pulse 67   Wt 156 lb (70.8 kg)   SpO2 97%   BMI 29.48 kg/m?  ? ?   ?Assessment & Plan:  ?BLESSING ZAUCHA comes in today with chief complaint of Medical Management of Chronic Issues, Hyperlipidemia, Hypertension, and Diabetes ? ? ?Diagnosis and orders addressed: ? ?1. Need for shingles vaccine ?- Varicella-zoster vaccine IM (Shingrix) ?- CMP14+EGFR ?- CBC with Differential/Platelet ? ?2. Type 2 diabetes mellitus with other specified complication, without long-term current use of insulin ([place]) ?- Microalbumin / creatinine urine ratio ?- Bayer DCA Hb A1c Waived ?- CMP14+EGFR ?- CBC with Differential/Platelet ? ?3. Vitamin D deficiency ?- CMP14+EGFR ?- CBC with Differential/Platelet ? ?4. Hypertension associated with diabetes ([place]) ?- CMP14+EGFR ?- CBC with Differential/Platelet ? ?5. Cirrhosis of liver without ascites, unspecified hepatic cirrhosis type ([place]) ?- CMP14+EGFR ?- CBC with Differential/Platelet ? ?6. Hyperlipidemia associated with type 2 diabetes mellitus ([place]) ?- CMP14+EGFR ?- CBC with Differential/Platelet ? ?7. Hypothyroidism, unspecified type ?- CMP14+EGFR ?- CBC with Differential/Platelet ? ?8. Peripheral neuropathy due to chemotherapy [facility]) ?- CMP14+EGFR ?- CBC with Differential/Platelet ? ?9. Endometrial adenocarcinoma ([place]) ?- CMP14+EGFR ?- CBC with Differential/Platelet ? ?10. Overweight (BMI 25.0-29.9) ?- CMP14+EGFR ?- CBC with Differential/Platelet ? ?11. B12 deficiency ?- CMP14+EGFR ?- CBC with Differential/Platelet ? ? ?Labs pending ?Health Maintenance reviewed ?Diet and exercise encouraged ? ?Follow up plan: ?3 months  ? ? ?Evelina Dun, FNP ? ? ? ?

## 2021-11-06 NOTE — Patient Instructions (Signed)
Health Maintenance After Age 78 After age 78, you are at a higher risk for certain long-term diseases and infections as well as injuries from falls. Falls are a major cause of broken bones and head injuries in people who are older than age 78. Getting regular preventive care can help to keep you healthy and well. Preventive care includes getting regular testing and making lifestyle changes as recommended by your health care provider. Talk with your health care provider about: Which screenings and tests you should have. A screening is a test that checks for a disease when you have no symptoms. A diet and exercise plan that is right for you. What should I know about screenings and tests to prevent falls? Screening and testing are the best ways to find a health problem early. Early diagnosis and treatment give you the best chance of managing medical conditions that are common after age 78. Certain conditions and lifestyle choices may make you more likely to have a fall. Your health care provider may recommend: Regular vision checks. Poor vision and conditions such as cataracts can make you more likely to have a fall. If you wear glasses, make sure to get your prescription updated if your vision changes. Medicine review. Work with your health care provider to regularly review all of the medicines you are taking, including over-the-counter medicines. Ask your health care provider about any side effects that may make you more likely to have a fall. Tell your health care provider if any medicines that you take make you feel dizzy or sleepy. Strength and balance checks. Your health care provider may recommend certain tests to check your strength and balance while standing, walking, or changing positions. Foot health exam. Foot pain and numbness, as well as not wearing proper footwear, can make you more likely to have a fall. Screenings, including: Osteoporosis screening. Osteoporosis is a condition that causes  the bones to get weaker and break more easily. Blood pressure screening. Blood pressure changes and medicines to control blood pressure can make you feel dizzy. Depression screening. You may be more likely to have a fall if you have a fear of falling, feel depressed, or feel unable to do activities that you used to do. Alcohol use screening. Using too much alcohol can affect your balance and may make you more likely to have a fall. Follow these instructions at home: Lifestyle Do not drink alcohol if: Your health care provider tells you not to drink. If you drink alcohol: Limit how much you have to: 0-1 drink a day for women. 0-2 drinks a day for men. Know how much alcohol is in your drink. In the [place] one drink equals one 12 oz bottle of beer (355 mL), one 5 oz glass of wine (148 mL), or one 1 oz glass of hard liquor (44 mL). Do not use any products that contain nicotine or tobacco. These products include cigarettes, chewing tobacco, and vaping devices, such as e-cigarettes. If you need help quitting, ask your health care provider. Activity  Follow a regular exercise program to stay fit. This will help you maintain your balance. Ask your health care provider what types of exercise are appropriate for you. If you need a cane or walker, use it as recommended by your health care provider. Wear supportive shoes that have nonskid soles. Safety  Remove any tripping hazards, such as rugs, cords, and clutter. Install safety equipment such as grab bars in bathrooms and safety rails on stairs. Keep rooms and walkways   well-lit. General instructions Talk with your health care provider about your risks for falling. Tell your health care provider if: You fall. Be sure to tell your health care provider about all falls, even ones that seem minor. You feel dizzy, tiredness (fatigue), or off-balance. Take over-the-counter and prescription medicines only as told by your health care provider. These include  supplements. Eat a healthy diet and maintain a healthy weight. A healthy diet includes low-fat dairy products, low-fat (lean) meats, and fiber from whole grains, beans, and lots of fruits and vegetables. Stay current with your vaccines. Schedule regular health, dental, and eye exams. Summary Having a healthy lifestyle and getting preventive care can help to protect your health and wellness after age 78. Screening and testing are the best way to find a health problem early and help you avoid having a fall. Early diagnosis and treatment give you the best chance for managing medical conditions that are more common for people who are older than age 78. Falls are a major cause of broken bones and head injuries in people who are older than age 78. Take precautions to prevent a fall at home. Work with your health care provider to learn what changes you can make to improve your health and wellness and to prevent falls. This information is not intended to replace advice given to you by your health care provider. Make sure you discuss any questions you have with your health care provider. Document Revised: 11/06/2020 Document Reviewed: 11/06/2020 Elsevier Patient Education  2023 [employer]  

## 2021-11-07 LAB — MICROALBUMIN / CREATININE URINE RATIO
Creatinine, Urine: 60.2 mg/dL
Microalb/Creat Ratio: 29 mg/g creat (ref 0–29)
Microalbumin, Urine: 17.5 ug/mL

## 2021-11-07 LAB — CMP14+EGFR
ALT: 17 IU/L (ref 0–32)
AST: 19 IU/L (ref 0–40)
Albumin/Globulin Ratio: 1.7 (ref 1.2–2.2)
Albumin: 4.8 g/dL — ABNORMAL HIGH (ref 3.7–4.7)
Alkaline Phosphatase: 41 IU/L — ABNORMAL LOW (ref 44–121)
BUN/Creatinine Ratio: 17 (ref 12–28)
BUN: 16 mg/dL (ref 8–27)
Bilirubin Total: 0.8 mg/dL (ref 0.0–1.2)
CO2: 25 mmol/L (ref 20–29)
Calcium: 10 mg/dL (ref 8.7–10.3)
Chloride: 101 mmol/L (ref 96–106)
Creatinine, Ser: 0.94 mg/dL (ref 0.57–1.00)
Globulin, Total: 2.8 g/dL (ref 1.5–4.5)
Glucose: 141 mg/dL — ABNORMAL HIGH (ref 70–99)
Potassium: 4.4 mmol/L (ref 3.5–5.2)
Sodium: 141 mmol/L (ref 134–144)
Total Protein: 7.6 g/dL (ref 6.0–8.5)
eGFR: 62 mL/min/{1.73_m2} (ref >59)

## 2021-11-07 LAB — CBC WITH DIFFERENTIAL/PLATELET
Basophils Absolute: 0.1 10*3/uL (ref 0.0–0.2)
Basos: 1 %
EOS (ABSOLUTE): 0.1 10*3/uL (ref 0.0–0.4)
Eos: 2 %
Hematocrit: 45.9 % (ref 34.0–46.6)
Hemoglobin: 15.4 g/dL (ref 11.1–15.9)
Immature Grans (Abs): 0 10*3/uL (ref 0.0–0.1)
Immature Granulocytes: 0 %
Lymphocytes Absolute: 3 10*3/uL (ref 0.7–3.1)
Lymphs: 40 %
MCH: 32.9 pg (ref 26.6–33.0)
MCHC: 33.6 g/dL (ref 31.5–35.7)
MCV: 98 fL — ABNORMAL HIGH (ref 79–97)
Monocytes Absolute: 0.5 10*3/uL (ref 0.1–0.9)
Monocytes: 6 %
Neutrophils Absolute: 3.9 10*3/uL (ref 1.4–7.0)
Neutrophils: 51 %
Platelets: 207 10*3/uL (ref 150–450)
RBC: 4.68 x10E6/uL (ref 3.77–5.28)
RDW: 12 % (ref 11.7–15.4)
WBC: 7.6 10*3/uL (ref 3.4–10.8)

## 2021-11-11 ENCOUNTER — Other Ambulatory Visit: Payer: Self-pay | Admitting: Family

## 2021-11-11 DIAGNOSIS — E1169 Type 2 diabetes mellitus with other specified complication: Secondary | ICD-10-CM

## 2021-11-11 NOTE — Progress Notes (Signed)
?Radiation Oncology         [phone] ?________________________________ ? ?Name: Alexandria Irwin MRN: [id number]  ?Date: 11/12/2021  DOB: 12/29/1945 ? ?Follow-Up Visit Note ? ?CC: Sharion Balloon, FNP  Everitt Amber, MD ? ?  ICD-10-CM   ?1. Endometrial adenocarcinoma ([place])  C54.1   ?  ?2. Endometrial cancer, FIGO stage IIIA ([facility])  C54.1   ?  ?3. Frequency of urination  R35.0 Urinalysis, Complete w Microscopic  ?  Urine culture  ?  ? ? 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 ?Interval Since Last Radiation: 4 years and 1 month  ? ?Radiation treatment dates:  09/18/2017 - 10/13/2017 ?  ?Site/dose:  Vaginal cuff / 30 Gy in 5 fractions ? ?Narrative:  The patient returns today for routine annual follow-up, she was last seen here for follow up on 11/09/20. Since her last visit, the patient followed up with Dr. Berline Lopes on 05/09/22. During which time, the patient denied any GU symptoms though was noted to be receiving treatment for a UTI with Keflex at the time. Her UTI caused her to have some dysuria which improved with abx. Unfortunately, the patient's husband passed away this past Jul 05, 2023. The patient was noted to report issues with early satiety / weight gain resulting from the loss of her husband. Otherwise, the patient was NED on examination at that time and denied any other issues or concerns.  ? ?Patient reports some urinary frequency and urgency.  She will be sent to the lab for urinalysis culture and sensitivity in light of this issue.  She denies any vaginal bleeding or discharge.  She denies any hematuria or rectal bleeding or abdominal bloating.  She denies any pelvic pain. ? ?Allergies:  is allergic to ace inhibitors, ciprocin-fluocin-procin [fluocinolone], norvasc [amlodipine], latex, neosporin [neomycin-bacitracin zn-polymyx], and sulfa antibiotics. ? ?Meds: ?Current Outpatient Medications  ?Medication Sig Dispense Refill  ?  acetaminophen (TYLENOL) 500 MG tablet Take 1,000 mg by mouth every 8 (eight) hours as needed for mild pain or headache.    ? amLODipine (NORVASC) 5 MG tablet Take 1 tablet (5 mg total) by mouth daily. 90 tablet 2  ? Ascorbic Acid (VITAMIN C) 100 MG CHEW Chew 100 mg by mouth daily.    ? aspirin EC 81 MG tablet Take 81 mg by mouth daily.    ? benzonatate (TESSALON PERLES) 100 MG capsule Take 1 capsule (100 mg total) by mouth 3 (three) times daily as needed for cough. 30 capsule 0  ? blood glucose meter kit and supplies Dispense based on patient and insurance preference. Use up to four times daily as directed. (FOR ICD-10 E10.9, E11.9). 1 each 0  ? cycloSPORINE (RESTASIS) 0.05 % ophthalmic emulsion Place 1 drop into both eyes 2 (two) times daily.     ? dapagliflozin propanediol (FARXIGA) 10 MG TABS tablet Take 1 tablet (10 mg total) by mouth daily before breakfast. 90 tablet 3  ? fluconazole (DIFLUCAN) 150 MG tablet Take 1 tablet (150 mg total) by mouth every 3 (three) days. 3 tablet 0  ? furosemide (LASIX) 20 MG tablet TAKE 1/2 TABLET BY MOUTH EVERY DAY 45 tablet 0  ? glimepiride (AMARYL) 4 MG tablet TAKE 1 TABLET BY MOUTH EVERY DAY WITH BREAKFAST 90 tablet 0  ? glucose blood (ONETOUCH VERIO) test strip USE TO TEST BLOOD SUGAR DAILY Dx E11.9 100 strip 3  ? Lancets (ONETOUCH DELICA PLUS [id number]) MISC 1 each by  Other route daily. Dx E11.9 100 each 8  ? levothyroxine (SYNTHROID) 88 MCG tablet TAKE 1 TABLET BY MOUTH EVERY DAY 90 tablet 3  ? meclizine (ANTIVERT) 25 MG tablet TAKE 1 TABLET (25 MG TOTAL) BY MOUTH 2 (TWO) TIMES DAILY AS NEEDED FOR DIZZINESS. 60 tablet 3  ? metFORMIN (GLUCOPHAGE-XR) 500 MG 24 hr tablet Take 2 tablets (1,000 mg total) by mouth daily with breakfast. 180 tablet 0  ? metoprolol succinate (TOPROL-XL) 50 MG 24 hr tablet Take 1 tablet (50 mg total) by mouth in the morning and at bedtime. 60 tablet 2  ? simvastatin (ZOCOR) 40 MG tablet TAKE 1 TABLET BY MOUTH DAILY AT 6 PM. 90 tablet 0  ? Vitamin D,  Ergocalciferol, (DRISDOL) 1.25 MG (50000 UNIT) CAPS capsule TAKE 1 CAPSULE (50,000 UNITS TOTAL) BY MOUTH EVERY 7 (SEVEN) DAYS 12 capsule 0  ? fluconazole (DIFLUCAN) 150 MG tablet Take 1 tablet (150 mg total) by mouth daily. Take second tablet 72 hours later 2 tablet 0  ? ?No current facility-administered medications for this encounter.  ? ? ?Physical Findings: ?The patient is in no acute distress. Patient is alert and oriented. ? height is _0  (1.549 m) and weight is 157 lb (71.2 kg). Her temperature is 98 ?F (36.7 ?C). Her blood pressure is 139/71 and her pulse is 62. Her respiration is 17 and oxygen saturation is 100%. .  No significant changes. Lungs are clear to auscultation bilaterally. Heart has regular rate and rhythm. No palpable cervical, supraclavicular, or axillary adenopathy. Abdomen soft, non-tender, normal bowel sounds. ? ?On pelvic examination the external genitalia are unremarkable.  Patient has a yeast infection in the introitus distal vagina.  On speculum examination the vaginal cuff shows very mild radiation changes.  No evidence of recurrence on inspection.  On bimanual examination the vaginal cuff is intact.  No evidence of the pelvic mass on bimanual exam.  Rectovaginal exam confirms.  Rectal sphincter tone good.  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 ? ? ? ?Lab Findings: ?Lab Results  ?Component Value Date  ? WBC 7.6 11/06/2021  ? HGB 15.4 11/06/2021  ? HCT 45.9 11/06/2021  ? MCV 98 (H) 11/06/2021  ? PLT 207 11/06/2021  ? ? ?Radiographic Findings: ?No results found. ? 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 ?No evidence of recurrence on clinical exam today.  As above she has possible bladder infection and will be sent to the lab for evaluation of this issue.  She has a yeast infection and will be placed on Diflucan for 2 doses.  Discussed that she may need to stop taking her statin during this short course of  medication. ? ?Plan: She will follow-up with Dr. Berline Lopes in 6 months.  Radiation oncology in 1 year. ? ? ?25 minutes of total time was spent for this patient encounter, including preparation, face-to-face counseling with the patient and coordination of care, physical exam, and documentation of the encounter. ?____________________________________ ? ?Blair Promise, PhD, MD ? ?This document serves as a record of services personally performed by Gery Pray, MD. It was created on his behalf by Roney Mans, a trained medical scribe. The creation of this record is based on the scribe's personal observations and the provider's statements to them. This document has been checked and approved by the attending provider. ? ?

## 2021-11-12 ENCOUNTER — Other Ambulatory Visit: Payer: Self-pay

## 2021-11-12 ENCOUNTER — Ambulatory Visit
Admission: RE | Admit: 2021-11-12 | Discharge: 2021-11-12 | Disposition: A | Discharge status: Home / Self Care | Payer: Medicare Other | Source: Ambulatory Visit | Attending: Radiation Oncology | Admitting: Radiation Oncology

## 2021-11-12 ENCOUNTER — Encounter: Payer: Self-pay | Admitting: Radiation Oncology

## 2021-11-12 DIAGNOSIS — Z923 Personal history of irradiation: Secondary | ICD-10-CM | POA: Insufficient documentation

## 2021-11-12 DIAGNOSIS — R350 Frequency of micturition: Secondary | ICD-10-CM | POA: Insufficient documentation

## 2021-11-12 DIAGNOSIS — R3915 Urgency of urination: Secondary | ICD-10-CM | POA: Insufficient documentation

## 2021-11-12 DIAGNOSIS — Z7984 Long term (current) use of oral hypoglycemic drugs: Secondary | ICD-10-CM | POA: Diagnosis not present

## 2021-11-12 DIAGNOSIS — Z79899 Other long term (current) drug therapy: Secondary | ICD-10-CM | POA: Insufficient documentation

## 2021-11-12 DIAGNOSIS — C541 Malignant neoplasm of endometrium: Secondary | ICD-10-CM

## 2021-11-12 DIAGNOSIS — Z7982 Long term (current) use of aspirin: Secondary | ICD-10-CM | POA: Diagnosis not present

## 2021-11-12 DIAGNOSIS — B379 Candidiasis, unspecified: Secondary | ICD-10-CM | POA: Diagnosis not present

## 2021-11-12 DIAGNOSIS — Z8542 Personal history of malignant neoplasm of other parts of uterus: Secondary | ICD-10-CM | POA: Diagnosis not present

## 2021-11-12 LAB — URINALYSIS, COMPLETE (UACMP) WITH MICROSCOPIC
Bilirubin Urine: NEGATIVE
Glucose, UA: >=500 mg/dL — AB
Hgb urine dipstick: NEGATIVE
Ketones, ur: NEGATIVE mg/dL
Leukocytes,Ua: NEGATIVE
Nitrite: NEGATIVE
Protein, ur: NEGATIVE mg/dL
Specific Gravity, Urine: 1.026 (ref 1.005–1.030)
pH: 5 (ref 5.0–8.0)

## 2021-11-12 MED ORDER — FLUCONAZOLE 150 MG PO TABS: 150.0000 mg | ORAL_TABLET | Freq: Every day | ORAL | 0 refills | Status: DC

## 2021-11-12 NOTE — Progress Notes (Signed)
Alexandria Irwin is here today for follow up post radiation to the pelvic. ? ?They completed their radiation on: 10/13/17 ? ?Does the patient complain of any of the following: ? ?Pain: No ?Abdominal bloating: No ?Diarrhea/Constipation: No ?Nausea/Vomiting: No ?Vaginal Discharge: No ?Blood in Urine or Stool: No ?Urinary Issues (dysuria/incomplete emptying/ incontinence/ increased frequency/urgency): Patient reports frequency and urgency. ?Does patient report using vaginal dilator 2-3 times a week and/or sexually active 2-3 weeks: No ?Post radiation skin changes: No ? ? ?Additional comments if applicable: ?  ?Vitals:  ? 11/12/21 1015  ?BP: 139/71  ?Pulse: 62  ?Resp: 17  ?Temp: 98 ?F (36.7 ?C)  ?SpO2: 100%  ?Weight: 157 lb (71.2 kg)  ?Height: '5\' 1"'$  (1.549 m)  ?  ?

## 2021-11-14 ENCOUNTER — Other Ambulatory Visit: Payer: Self-pay | Admitting: Radiation Oncology

## 2021-11-14 LAB — URINE CULTURE: Culture: 40000 — AB

## 2021-11-14 MED ORDER — NITROFURANTOIN MONOHYD MACRO 100 MG PO CAPS: 100.0000 mg | ORAL_CAPSULE | Freq: Two times a day (BID) | ORAL | 0 refills | Status: DC

## 2021-12-15 ENCOUNTER — Other Ambulatory Visit: Payer: Self-pay | Admitting: Family

## 2021-12-15 DIAGNOSIS — I152 Hypertension secondary to endocrine disorders: Secondary | ICD-10-CM

## 2021-12-15 DIAGNOSIS — R42 Dizziness and giddiness: Secondary | ICD-10-CM

## 2021-12-24 ENCOUNTER — Other Ambulatory Visit: Payer: Medicare Other

## 2021-12-24 ENCOUNTER — Other Ambulatory Visit: Payer: Self-pay | Admitting: Family

## 2021-12-24 DIAGNOSIS — E539 Vitamin B deficiency, unspecified: Secondary | ICD-10-CM | POA: Diagnosis not present

## 2021-12-24 DIAGNOSIS — R6889 Other general symptoms and signs: Secondary | ICD-10-CM | POA: Diagnosis not present

## 2021-12-24 MED ORDER — DAPAGLIFLOZIN PROPANEDIOL 10 MG PO TABS: 10.0000 mg | ORAL_TABLET | Freq: Every day | ORAL | 3 refills | Status: DC

## 2021-12-25 LAB — VITAMIN B12: Vitamin B-12: 291 pg/mL (ref 232–1245)

## 2021-12-27 DIAGNOSIS — M79676 Pain in unspecified toe(s): Secondary | ICD-10-CM | POA: Diagnosis not present

## 2021-12-27 DIAGNOSIS — E1142 Type 2 diabetes mellitus with diabetic polyneuropathy: Secondary | ICD-10-CM | POA: Diagnosis not present

## 2021-12-27 DIAGNOSIS — L84 Corns and callosities: Secondary | ICD-10-CM | POA: Diagnosis not present

## 2021-12-27 DIAGNOSIS — B351 Tinea unguium: Secondary | ICD-10-CM | POA: Diagnosis not present

## 2022-01-06 ENCOUNTER — Other Ambulatory Visit: Payer: Self-pay | Admitting: Family

## 2022-01-06 DIAGNOSIS — E1169 Type 2 diabetes mellitus with other specified complication: Secondary | ICD-10-CM

## 2022-01-12 ENCOUNTER — Other Ambulatory Visit: Payer: Self-pay | Admitting: Family

## 2022-01-24 ENCOUNTER — Other Ambulatory Visit: Payer: Self-pay | Admitting: Family

## 2022-01-24 ENCOUNTER — Other Ambulatory Visit: Payer: Self-pay

## 2022-01-24 DIAGNOSIS — E559 Vitamin D deficiency, unspecified: Secondary | ICD-10-CM

## 2022-01-24 MED ORDER — DAPAGLIFLOZIN PROPANEDIOL 10 MG PO TABS: 10.0000 mg | ORAL_TABLET | Freq: Every day | ORAL | 3 refills | Status: DC

## 2022-01-31 ENCOUNTER — Other Ambulatory Visit: Payer: Self-pay | Admitting: Family

## 2022-01-31 ENCOUNTER — Ambulatory Visit (INDEPENDENT_AMBULATORY_CARE_PROVIDER_SITE_OTHER): Payer: Medicare Other

## 2022-01-31 VITALS — Wt 157.0 lb

## 2022-01-31 DIAGNOSIS — Z0000 Encounter for general adult medical examination without abnormal findings: Secondary | ICD-10-CM

## 2022-01-31 DIAGNOSIS — E1169 Type 2 diabetes mellitus with other specified complication: Secondary | ICD-10-CM

## 2022-01-31 NOTE — Progress Notes (Signed)
Virtual Visit via Telephone Note  I connected with  Alexandria Irwin on 01/31/22 at  8:15 AM EDT by telephone and verified that I am speaking with the correct person using two identifiers.  Location: Patient: home Provider: [facility] Persons participating in the virtual visit: patient/Nurse Health Advisor   I discussed the limitations, risks, security and privacy concerns of performing an evaluation and management service by telephone and the availability of in person appointments. The patient expressed understanding and agreed to proceed.  Interactive audio and video telecommunications were attempted between this nurse and patient, however failed, due to patient having technical difficulties OR patient did not have access to video capability.  We continued and completed visit with audio only.  Some vital signs may be absent or patient reported.   Dionisio David, LPN  Subjective:   Alexandria Irwin is a 76 y.o. female who presents for Medicare Annual (Subsequent) preventive examination.  Review of Systems     Cardiac Risk Factors include: diabetes mellitus;dyslipidemia;hypertension;advanced age (>55mn, >>76women)     Objective:    There were no vitals filed for this visit. There is no height or weight on file to calculate BMI.     01/31/2022    8:21 AM 11/12/2021   10:15 AM 01/30/2021    9:10 AM 11/09/2020   10:37 AM 08/09/2020   10:54 AM 03/29/2020    9:14 AM 01/28/2020    8:34 AM  Advanced Directives  Does Patient Have a Medical Advance Directive? _0  No No  Would patient like information on creating a medical advance directive? No - Patient declined No - Patient declined No - Patient declined No - Patient declined No - Patient declined  No - Patient declined    Current Medications (verified) Outpatient Encounter Medications as of 01/31/2022  Medication Sig   acetaminophen (TYLENOL) 500 MG tablet Take 1,000 mg by mouth every 8 (eight) hours as needed for mild pain or  headache.   amLODipine (NORVASC) 5 MG tablet Take 1 tablet (5 mg total) by mouth daily.   Ascorbic Acid (VITAMIN C) 100 MG CHEW Chew 100 mg by mouth daily.   aspirin EC 81 MG tablet Take 81 mg by mouth daily.   blood glucose meter kit and supplies Dispense based on patient and insurance preference. Use up to four times daily as directed. (FOR ICD-10 E10.9, E11.9).   cycloSPORINE (RESTASIS) 0.05 % ophthalmic emulsion Place 1 drop into both eyes 2 (two) times daily.    dapagliflozin propanediol (FARXIGA) 10 MG TABS tablet Take 1 tablet (10 mg total) by mouth daily before breakfast.   furosemide (LASIX) 20 MG tablet TAKE 1/2 TABLET BY MOUTH EVERY DAY   glimepiride (AMARYL) 4 MG tablet TAKE 1 TABLET BY MOUTH EVERY DAY WITH BREAKFAST   glucose blood (ONETOUCH VERIO) test strip USE TO TEST BLOOD SUGAR DAILY Dx E11.9   Lancets (ONETOUCH DELICA PLUS [id number] MISC 1 each by Other route daily. Dx E11.9   levothyroxine (SYNTHROID) 88 MCG tablet TAKE 1 TABLET BY MOUTH EVERY DAY   meclizine (ANTIVERT) 25 MG tablet TAKE 1 TABLET (25 MG TOTAL) BY MOUTH 2 (TWO) TIMES DAILY AS NEEDED FOR DIZZINESS.   metFORMIN (GLUCOPHAGE-XR) 500 MG 24 hr tablet Take 2 tablets (1,000 mg total) by mouth daily with breakfast. (NEEDS TO BE SEEN BEFORE NEXT REFILL in Aug)   metoprolol succinate (TOPROL-XL) 50 MG 24 hr tablet TAKE 1 TABLET (50 MG) BY MOUTH IN THE MORNING AND  AT BEDTIME   simvastatin (ZOCOR) 40 MG tablet TAKE 1 TABLET BY MOUTH DAILY AT 6 PM.   Vitamin D, Ergocalciferol, (DRISDOL) 1.25 MG (50000 UNIT) CAPS capsule TAKE 1 CAPSULE (50,000 UNITS TOTAL) BY MOUTH EVERY 7 (SEVEN) DAYS   [DISCONTINUED] metoprolol (LOPRESSOR) 50 MG tablet TAKE 1 TABLET (50 MG TOTAL) BY MOUTH 2 (TWO) TIMES DAILY.   No facility-administered encounter medications on file as of 01/31/2022.    Allergies (verified) Ace inhibitors, Ciprocin-fluocin-procin [fluocinolone], Norvasc [amlodipine], Latex, Neosporin [neomycin-bacitracin zn-polymyx], and  Sulfa antibiotics   History: Past Medical History:  Diagnosis Date   Cataract    Endometrial adenocarcinoma ([place])    Environmental and seasonal allergies    Fall 07/2019   Family history of ovarian cancer    Family history of pancreatic cancer    Fx anat neck humerus-open 07/2019   Genetic testing 09/19/2017   Multi-Cancer panel (83 genes) @ [facility] - No pathogenic mutations detected   GERD (gastroesophageal reflux disease)    History of DVT of lower extremity    early 2000s--- lower right leg   History of radiation therapy 09/18/2017-10/13/2017   vaginal brachytherapy (HDR) - endometrial      Dr Gery Pray   Hyperlipidemia    Hypertension    Hypothyroidism    Type 2 diabetes mellitus ([place])    followed by pcp   Umbilical hernia    Vitamin B 12 deficiency    Vitamin D deficiency    Wears glasses    Past Surgical History:  Procedure Laterality Date   BREAST BIOPSY Left 2009   benign   CATARACT EXTRACTION W/ INTRAOCULAR LENS IMPLANT Left 2018   CATARACT EXTRACTION W/PHACO Right 03/14/2014   Procedure: CATARACT EXTRACTION PHACO AND INTRAOCULAR LENS PLACEMENT ([place]);  Surgeon: Tonny Branch, MD;  Location: [facility];  Service: Ophthalmology;  Laterality: Right;  CDE: 8.34   COLONOSCOPY WITH PROPOFOL N/A 08/11/2020   Procedure: COLONOSCOPY WITH PROPOFOL;  Surgeon: Harvel Quale, MD;  Location: [facility];  Service: Gastroenterology;  Laterality: N/A;  7:30   FOOT GANGLION EXCISION Right 2013   HYSTEROSCOPY WITH D & C N/A 06/13/2017   Procedure: DILATATION AND CURETTAGE /HYSTEROSCOPY;  Surgeon: Alden Hipp, MD;  Location: [facility];  Service: Gynecology;  Laterality: N/A;   IR FLUORO GUIDE PORT INSERTION RIGHT  08/27/2017   IR REMOVAL TUN ACCESS W/ PORT W/O FL MOD SED  04/27/2018   IR US GUIDE VASC ACCESS RIGHT  08/27/2017   LAPAROSCOPIC CHOLECYSTECTOMY  2005   POLYPECTOMY  08/11/2020   Procedure: POLYPECTOMY INTESTINAL;  Surgeon: Harvel Quale, MD;  [facility];  Service: Gastroenterology;;   ROBOTIC ASSISTED TOTAL HYSTERECTOMY WITH BILATERAL SALPINGO OOPHERECTOMY Bilateral 08/05/2017   Procedure: ROBOTIC ASSISTED TOTAL HYSTERECTOMY WITH BILATERAL SALPINGO OOPHORECTOMY;  Surgeon: Everitt Amber, MD;  Location: [facility];  Service: Gynecology;  Laterality: Bilateral;   SENTINEL NODE BIOPSY Bilateral 08/05/2017   Procedure: SENTINEL NODE BIOPSY;  Surgeon: Everitt Amber, MD;  Location: [facility];  Service: Gynecology;  Laterality: Bilateral;   UMBILICAL HERNIA REPAIR N/A 08/05/2017   Procedure: HERNIA REPAIR UMBILICAL ADULT;  Surgeon: Everitt Amber, MD;  Location: [facility];  Service: Gynecology;  Laterality: N/A;   VEIN SURGERY Right 2011   vein stripping-leg  and laser treatment   Family History  Problem Relation Age of Onset   Pancreatic cancer Mother 74       deceased 18   Ovarian cancer Mother  dx 30s   Cancer Other 24       maternal half-sister; unk. type   Social History   Socioeconomic History   Marital status: Widowed    Spouse name: Johnny   Number of children: 4   Years of education: Not on file   Highest education level: Some college, no degree  Occupational History   Occupation: retired  Tobacco Use   Smoking status: Never   Smokeless tobacco: Never  Vaping Use   Vaping Use: Never used  Substance and Sexual Activity   Alcohol use: No   Drug use: No   Sexual activity: Yes    Birth control/protection: Post-menopausal  Other Topics Concern   Not on file  Social History Narrative   Her husband passed 05/2020 - her daughter, Lattie Haw lives with her   Social Determinants of Health   Financial Resource Strain: [place]  (01/31/2022)   Overall Financial Resource Strain (CARDIA)    Difficulty of Paying Living Expenses: Not hard at all  Food Insecurity: No Food Insecurity (01/31/2022)   Hunger Vital Sign    Worried About Running Out of Food in the Last Year: Never true    [place] in the Last Year: Never true   Transportation Needs: No Transportation Needs (01/31/2022)   PRAPARE - Hydrologist (Medical): No    Lack of Transportation (Non-Medical): No  Physical Activity: Inactive (01/31/2022)   Exercise Vital Sign    Days of Exercise per Week: 0 days    Minutes of Exercise per Session: 0 min  Stress: No Stress Concern Present (01/31/2022)   [place]    Feeling of Stress : Not at all  Social Connections: Socially Isolated (01/31/2022)   Social Connection and Isolation Panel [NHANES]    Frequency of Communication with Friends and Family: More than three times a week    Frequency of Social Gatherings with Friends and Family: More than three times a week    Attends Religious Services: Never    Marine scientist or Organizations: No    Attends Archivist Meetings: Never    Marital Status: Widowed    Tobacco Counseling Counseling given: Not Answered   Clinical Intake:  Pre-visit preparation completed: Yes  Pain : No/denies pain     Nutritional Risks: None Diabetes: Yes CBG done?: No Did pt. bring in CBG monitor from home?: No  How often do you need to have someone help you when you read instructions, pamphlets, or other written materials from your doctor or pharmacy?: 1 - Never  Diabetic?yes Nutrition Risk Assessment:  Has the patient had any N/V/D within the last 2 months?  No  Does the patient have any non-healing wounds?  No  Has the patient had any unintentional weight loss or weight gain?  No   Diabetes:  Is the patient diabetic?  Yes  If diabetic, was a CBG obtained today?  No  Did the patient bring in their glucometer from home?  No  How often do you monitor your CBG's? Every morning.   Financial Strains and Diabetes Management:  Are you having any financial strains with the device, your supplies or your medication? No .  Does the patient want to be seen by  Chronic Care Management for management of their diabetes?  No  Would the patient like to be referred to a Nutritionist or for Diabetic Management?  No  Diabetic Exams:  Diabetic Eye Exam: Completed 05/16/20. Overdue for diabetic eye exam. Pt has been advised about the importance in completing this exam.   Diabetic Foot Exam: Completed 04/30/21. Pt has been advised about the importance in completing this exam.   Interpreter Needed?: No  Information entered by :: Kirke Shaggy, LPN   Activities of Daily Living    01/31/2022    8:22 AM  In your present state of health, do you have any difficulty performing the following activities:  Hearing? 1  Vision? 0  Difficulty concentrating or making decisions? 0  Walking or climbing stairs? 0  Dressing or bathing? 0  Doing errands, shopping? 0  Preparing Food and eating ? N  Using the Toilet? N  In the past six months, have you accidently leaked urine? N  Do you have problems with loss of bowel control? N  Managing your Medications? N  Managing your Finances? N  Housekeeping or managing your Housekeeping? N    Patient Care Team: Sharion Balloon, FNP as PCP - General (Family Medicine)  Indicate any recent Medical Services you may have received from other than [facility] providers in the past year (date may be approximate).     Assessment:   This is a routine wellness examination for [facility].  Hearing/Vision screen Hearing Screening - Comments:: No aids Vision Screening - Comments:: Glasses- [facility] in [place]  Dietary issues and exercise activities discussed: Current Exercise Habits: The patient does not participate in regular exercise at present   Goals Addressed             This Visit's Progress    DIET - EAT MORE FRUITS AND VEGETABLES         Depression Screen    01/31/2022    8:19 AM 11/06/2021   11:05 AM 08/08/2021   12:14 PM 08/02/2021    8:36 AM 04/30/2021   10:24 AM 01/30/2021    9:09 AM 10/27/2020   12:10 PM  PHQ 2/9  Scores  PHQ - 2 Score 1 0 0 0 0 1 0  PHQ- 9 Score 1  0        Fall Risk    01/31/2022    8:22 AM 11/06/2021   11:05 AM 08/08/2021   12:14 PM 08/02/2021    8:35 AM 04/30/2021   10:24 AM  Fall Risk   Falls in the past year? 0 0 0 1 0  Number falls in past yr: 0   0   Injury with Fall? 0   0   Risk for fall due to : No Fall Risks   History of fall(s)   Follow up Falls evaluation completed        FALL RISK PREVENTION PERTAINING TO THE HOME:  Any stairs in or around the home? No  If so, are there any without handrails? No  Home free of loose throw rugs in walkways, pet beds, electrical cords, etc? Yes  Adequate lighting in your home to reduce risk of falls? Yes   ASSISTIVE DEVICES UTILIZED TO PREVENT FALLS:  Life alert? No  Use of a cane, walker or w/c? No  Grab bars in the bathroom? Yes  Shower chair or bench in shower? Yes  Elevated toilet seat or a handicapped toilet? No    Cognitive Function:        01/31/2022    8:26 AM 01/30/2021    9:14 AM 01/28/2020    8:37 AM 12/18/2018    8:43 AM  6CIT  Screen  What Year? 0 points 0 points 0 points 0 points  What month? 0 points 0 points 0 points 0 points  What time? 0 points 0 points 0 points 0 points  Count back from 20 0 points 0 points 0 points 0 points  Months in reverse 0 points 0 points 0 points 0 points  Repeat phrase 0 points 0 points 0 points 0 points  Total Score 0 points 0 points 0 points 0 points    Immunizations Immunization History  Administered Date(s) Administered   Fluad Quad(high Dose 65+) 04/30/2021   Influenza Whole 08/02/2011   Influenza,inj,Quad PF,6+ Mos 05/04/2013, 04/23/2014, 04/17/2015   Influenza-Unspecified 01/30/2016, 04/20/2019   Moderna Covid-19 Vaccine Bivalent Booster 45yrs & up 05/05/2021   PFIZER(Purple Top)SARS-COV-2 Vaccination 08/07/2019, 09/02/2019, 05/12/2020, 10/29/2020   Pneumococcal Conjugate-13 04/22/2017   Pneumococcal Polysaccharide-23 06/01/2018   Tdap 12/12/2018   Zoster  Recombinat (Shingrix) 11/06/2021    TDAP status: Up to date  Flu Vaccine status: Up to date  Pneumococcal vaccine status: Up to date  Covid-19 vaccine status: Completed vaccines  Qualifies for Shingles Vaccine? Yes   Zostavax completed No   Shingrix Completed?: No.    Education has been provided regarding the importance of this vaccine. Patient has been advised to call insurance company to determine out of pocket expense if they have not yet received this vaccine. Advised may also receive vaccine at local pharmacy or Health Dept. Verbalized acceptance and understanding.  Screening Tests Health Maintenance  Topic Date Due   OPHTHALMOLOGY EXAM  05/16/2021   Zoster Vaccines- Shingrix (2 of 2) 01/01/2022   INFLUENZA VACCINE  01/29/2022   FOOT EXAM  04/30/2022   HEMOGLOBIN A1C  05/09/2022   URINE MICROALBUMIN  11/07/2022   TETANUS/TDAP  12/11/2028   Pneumonia Vaccine 7+ Years old  Completed   DEXA SCAN  Completed   COVID-19 Vaccine  Completed   Hepatitis C Screening  Completed   HPV VACCINES  Aged Out   COLONOSCOPY (Pts 45-32yrs Insurance coverage will need to be confirmed)  Discontinued    Health Maintenance  Health Maintenance Due  Topic Date Due   OPHTHALMOLOGY EXAM  05/16/2021   Zoster Vaccines- Shingrix (2 of 2) 01/01/2022   INFLUENZA VACCINE  01/29/2022    Colorectal cancer screening: No longer required.   Mammogram status: No longer required due to age.  Bone Density status: Completed 03/02/15. Results reflect: Bone density results: NORMAL. Repeat every 5 years.- declined referral  Lung Cancer Screening: (Low Dose CT Chest recommended if Age 24-80 years, 30 pack-year currently smoking OR have quit w/in 15years.) does not qualify.    Additional Screening:  Hepatitis C Screening: does qualify; Completed 04/29/18  Vision Screening: Recommended annual ophthalmology exams for early detection of glaucoma and other disorders of the eye. Is the patient up to date with  their annual eye exam?  Yes  Who is the provider or what is the name of the office in which the patient attends annual eye exams? [facility] in [place] If pt is not established with a provider, would they like to be referred to a provider to establish care? No .   Dental Screening: Recommended annual dental exams for proper oral hygiene  Community Resource Referral / Chronic Care Management: CRR required this visit?  No   CCM required this visit?  No      Plan:     I have personally reviewed and noted the following in the patient's chart:   Medical and  social history Use of alcohol, tobacco or illicit drugs  Current medications and supplements including opioid prescriptions.  Functional ability and status Nutritional status Physical activity Advanced directives List of other physicians Hospitalizations, surgeries, and ER visits in previous 12 months Vitals Screenings to include cognitive, depression, and falls Referrals and appointments  In addition, I have reviewed and discussed with patient certain preventive protocols, quality metrics, and best practice recommendations. A written personalized care plan for preventive services as well as general preventive health recommendations were provided to patient.     Dionisio David, LPN   [id number]   Nurse Notes: none

## 2022-01-31 NOTE — Patient Instructions (Signed)
Alexandria Irwin , Thank you for taking time to come for your Medicare Wellness Visit. I appreciate your ongoing commitment to your health goals. Please review the following plan we discussed and let me know if I can assist you in the future.   Screening recommendations/referrals: Colonoscopy: aged out Mammogram: aged out Bone Density: 03/02/15, declined referral Recommended yearly ophthalmology/optometry visit for glaucoma screening and checkup Recommended yearly dental visit for hygiene and checkup  Vaccinations: Influenza vaccine: 04/30/21 Pneumococcal vaccine: 06/01/18 Tdap vaccine: 12/12/18 Shingles vaccine: Shingrix 11/06/21, needs second shot   Covid-19:08/07/19, 09/02/19, 05/12/20, 10/29/20, 05/05/21  Advanced directives: no  Conditions/risks identified: none  Next appointment: Follow up in one year for your annual wellness visit 02/03/23 @ 8:15 am by phone   Preventive Care 65 Years and Older, Female Preventive care refers to lifestyle choices and visits with your health care provider that can promote health and wellness. What does preventive care include? A yearly physical exam. This is also called an annual well check. Dental exams once or twice a year. Routine eye exams. Ask your health care provider how often you should have your eyes checked. Personal lifestyle choices, including: Daily care of your teeth and gums. Regular physical activity. Eating a healthy diet. Avoiding tobacco and drug use. Limiting alcohol use. Practicing safe sex. Taking low-dose aspirin every day. Taking vitamin and mineral supplements as recommended by your health care provider. What happens during an annual well check? The services and screenings done by your health care provider during your annual well check will depend on your age, overall health, lifestyle risk factors, and family history of disease. Counseling  Your health care provider may ask you questions about your: Alcohol use. Tobacco use. Drug  use. Emotional well-being. Home and relationship well-being. Sexual activity. Eating habits. History of falls. Memory and ability to understand (cognition). Work and work Statistician. Reproductive health. Screening  You may have the following tests or measurements: Height, weight, and BMI. Blood pressure. Lipid and cholesterol levels. These may be checked every 5 years, or more frequently if you are over 55 years old. Skin check. Lung cancer screening. You may have this screening every year starting at age 79 if you have a 30-pack-year history of smoking and currently smoke or have quit within the past 15 years. Fecal occult blood test (FOBT) of the stool. You may have this test every year starting at age 71. Flexible sigmoidoscopy or colonoscopy. You may have a sigmoidoscopy every 5 years or a colonoscopy every 10 years starting at age 28. Hepatitis C blood test. Hepatitis B blood test. Sexually transmitted disease (STD) testing. Diabetes screening. This is done by checking your blood sugar (glucose) after you have not eaten for a while (fasting). You may have this done every 1-3 years. Bone density scan. This is done to screen for osteoporosis. You may have this done starting at age 31. Mammogram. This may be done every 1-2 years. Talk to your health care provider about how often you should have regular mammograms. Talk with your health care provider about your test results, treatment options, and if necessary, the need for more tests. Vaccines  Your health care provider may recommend certain vaccines, such as: Influenza vaccine. This is recommended every year. Tetanus, diphtheria, and acellular pertussis (Tdap, Td) vaccine. You may need a Td booster every 10 years. Zoster vaccine. You may need this after age 49. Pneumococcal 13-valent conjugate (PCV13) vaccine. One dose is recommended after age 17. Pneumococcal polysaccharide (PPSV23) vaccine. One dose  is recommended after age  24. Talk to your health care provider about which screenings and vaccines you need and how often you need them. This information is not intended to replace advice given to you by your health care provider. Make sure you discuss any questions you have with your health care provider. Document Released: 07/14/2015 Document Revised: 03/06/2016 Document Reviewed: 04/18/2015 Elsevier Interactive Patient Education  2017 [employer] Prevention in the Home Falls can cause injuries. They can happen to people of all ages. There are many things you can do to make your home safe and to help prevent falls. What can I do on the outside of my home? Regularly fix the edges of walkways and driveways and fix any cracks. Remove anything that might make you trip as you walk through a door, such as a raised step or threshold. Trim any bushes or trees on the path to your home. Use bright outdoor lighting. Clear any walking paths of anything that might make someone trip, such as rocks or tools. Regularly check to see if handrails are loose or broken. Make sure that both sides of any steps have handrails. Any raised decks and porches should have guardrails on the edges. Have any leaves, snow, or ice cleared regularly. Use sand or salt on walking paths during winter. Clean up any spills in your garage right away. This includes oil or grease spills. What can I do in the bathroom? Use night lights. Install grab bars by the toilet and in the tub and shower. Do not use towel bars as grab bars. Use non-skid mats or decals in the tub or shower. If you need to sit down in the shower, use a plastic, non-slip stool. Keep the floor dry. Clean up any water that spills on the floor as soon as it happens. Remove soap buildup in the tub or shower regularly. Attach bath mats securely with double-sided non-slip rug tape. Do not have throw rugs and other things on the floor that can make you trip. What can I do in the  bedroom? Use night lights. Make sure that you have a light by your bed that is easy to reach. Do not use any sheets or blankets that are too big for your bed. They should not hang down onto the floor. Have a firm chair that has side arms. You can use this for support while you get dressed. Do not have throw rugs and other things on the floor that can make you trip. What can I do in the kitchen? Clean up any spills right away. Avoid walking on wet floors. Keep items that you use a lot in easy-to-reach places. If you need to reach something above you, use a strong step stool that has a grab bar. Keep electrical cords out of the way. Do not use floor polish or wax that makes floors slippery. If you must use wax, use non-skid floor wax. Do not have throw rugs and other things on the floor that can make you trip. What can I do with my stairs? Do not leave any items on the stairs. Make sure that there are handrails on both sides of the stairs and use them. Fix handrails that are broken or loose. Make sure that handrails are as long as the stairways. Check any carpeting to make sure that it is firmly attached to the stairs. Fix any carpet that is loose or worn. Avoid having throw rugs at the top or bottom of the stairs.  If you do have throw rugs, attach them to the floor with carpet tape. Make sure that you have a light switch at the top of the stairs and the bottom of the stairs. If you do not have them, ask someone to add them for you. What else can I do to help prevent falls? Wear shoes that: Do not have high heels. Have rubber bottoms. Are comfortable and fit you well. Are closed at the toe. Do not wear sandals. If you use a stepladder: Make sure that it is fully opened. Do not climb a closed stepladder. Make sure that both sides of the stepladder are locked into place. Ask someone to hold it for you, if possible. Clearly mark and make sure that you can see: Any grab bars or  handrails. First and last steps. Where the edge of each step is. Use tools that help you move around (mobility aids) if they are needed. These include: Canes. Walkers. Scooters. Crutches. Turn on the lights when you go into a dark area. Replace any light bulbs as soon as they burn out. Set up your furniture so you have a clear path. Avoid moving your furniture around. If any of your floors are uneven, fix them. If there are any pets around you, be aware of where they are. Review your medicines with your doctor. Some medicines can make you feel dizzy. This can increase your chance of falling. Ask your doctor what other things that you can do to help prevent falls. This information is not intended to replace advice given to you by your health care provider. Make sure you discuss any questions you have with your health care provider. Document Released: 04/13/2009 Document Revised: 11/23/2015 Document Reviewed: 07/22/2014 Elsevier Interactive Patient Education  2017 [employer].

## 2022-02-07 ENCOUNTER — Telehealth: Payer: Self-pay | Admitting: Family

## 2022-02-07 ENCOUNTER — Other Ambulatory Visit: Payer: Self-pay | Admitting: Family

## 2022-02-07 ENCOUNTER — Encounter: Payer: Self-pay | Admitting: Family

## 2022-02-07 ENCOUNTER — Ambulatory Visit (INDEPENDENT_AMBULATORY_CARE_PROVIDER_SITE_OTHER): Payer: Medicare Other | Admitting: Family

## 2022-02-07 VITALS — BP 129/73 | HR 64 | Temp 97.8°F | Ht 61.0 in | Wt 163.0 lb

## 2022-02-07 DIAGNOSIS — E538 Deficiency of other specified B group vitamins: Secondary | ICD-10-CM | POA: Diagnosis not present

## 2022-02-07 DIAGNOSIS — G620 Drug-induced polyneuropathy: Secondary | ICD-10-CM

## 2022-02-07 DIAGNOSIS — E039 Hypothyroidism, unspecified: Secondary | ICD-10-CM

## 2022-02-07 DIAGNOSIS — C541 Malignant neoplasm of endometrium: Secondary | ICD-10-CM | POA: Diagnosis not present

## 2022-02-07 DIAGNOSIS — K7460 Unspecified cirrhosis of liver: Secondary | ICD-10-CM | POA: Diagnosis not present

## 2022-02-07 DIAGNOSIS — E1169 Type 2 diabetes mellitus with other specified complication: Secondary | ICD-10-CM

## 2022-02-07 DIAGNOSIS — Z23 Encounter for immunization: Secondary | ICD-10-CM

## 2022-02-07 DIAGNOSIS — E785 Hyperlipidemia, unspecified: Secondary | ICD-10-CM

## 2022-02-07 DIAGNOSIS — E669 Obesity, unspecified: Secondary | ICD-10-CM

## 2022-02-07 DIAGNOSIS — L989 Disorder of the skin and subcutaneous tissue, unspecified: Secondary | ICD-10-CM | POA: Diagnosis not present

## 2022-02-07 DIAGNOSIS — K760 Fatty (change of) liver, not elsewhere classified: Secondary | ICD-10-CM

## 2022-02-07 DIAGNOSIS — E559 Vitamin D deficiency, unspecified: Secondary | ICD-10-CM | POA: Diagnosis not present

## 2022-02-07 DIAGNOSIS — E1159 Type 2 diabetes mellitus with other circulatory complications: Secondary | ICD-10-CM

## 2022-02-07 DIAGNOSIS — I152 Hypertension secondary to endocrine disorders: Secondary | ICD-10-CM | POA: Diagnosis not present

## 2022-02-07 DIAGNOSIS — T451X5A Adverse effect of antineoplastic and immunosuppressive drugs, initial encounter: Secondary | ICD-10-CM

## 2022-02-07 LAB — BAYER DCA HB A1C WAIVED: HB A1C (BAYER DCA - WAIVED): 7.5 % — ABNORMAL HIGH (ref 4.8–5.6)

## 2022-02-07 NOTE — Patient Instructions (Signed)

## 2022-02-07 NOTE — Progress Notes (Signed)
Subjective:    Patient ID: Alexandria Irwin, female    DOB: Dec 15, 1945, 76 y.o.   MRN: [id number]  Chief Complaint  Patient presents with   Medical Management of Chronic Issues   Pt presents to the office today for chronic follow up. 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 She has completed chemo and radiation. 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   She is complaining of skin lesion corner of left eye. Requesting referral to Derm.  Hypertension This is a chronic problem. The current episode started more than 1 year ago. The problem has been resolved since onset. The problem is controlled. Associated symptoms include malaise/fatigue and peripheral edema. Pertinent negatives include no blurred vision or shortness of breath. Risk factors for coronary artery disease include dyslipidemia, diabetes mellitus, obesity and sedentary lifestyle. The current treatment provides moderate improvement. Identifiable causes of hypertension include a thyroid problem.  Thyroid Problem Presents for follow-up visit. Symptoms include fatigue. Patient reports no anxiety, constipation or diaphoresis. The symptoms have been stable. Her past medical history is significant for hyperlipidemia.  Hyperlipidemia This is a chronic problem. The current episode started more than 1 year ago. The problem is controlled. Recent lipid tests were reviewed and are normal. Exacerbating diseases include obesity. Pertinent negatives include no shortness of breath. Current antihyperlipidemic treatment includes statins. The current treatment provides moderate improvement of lipids. Risk factors for coronary artery disease include dyslipidemia, diabetes mellitus, hypertension, post-menopausal and a sedentary lifestyle.  Diabetes She presents for her follow-up diabetic visit. She has type 2 diabetes mellitus. Pertinent negatives for hypoglycemia include  no nervousness/anxiousness. Associated symptoms include fatigue and foot paresthesias. Pertinent negatives for diabetes include no blurred vision. Symptoms are worsening. Risk factors for coronary artery disease include dyslipidemia, diabetes mellitus, hypertension, sedentary lifestyle and post-menopausal. She is following a generally healthy diet. Her overall blood glucose range is 140-180 mg/dl. Eye exam is current.  Anemia Presents for follow-up visit. Symptoms include malaise/fatigue. There has been no bruising/bleeding easily.      Review of Systems  Constitutional:  Positive for fatigue and malaise/fatigue. Negative for diaphoresis.  Eyes:  Negative for blurred vision.  Respiratory:  Negative for shortness of breath.   Gastrointestinal:  Negative for constipation.  Hematological:  Does not bruise/bleed easily.  Psychiatric/Behavioral:  The patient is not nervous/anxious.   All other systems reviewed and are negative.      Objective:   Physical Exam Vitals reviewed.  Constitutional:      General: She is not in acute distress.    Appearance: She is well-developed. She is obese.  HENT:     Head: Normocephalic and atraumatic.     Right Ear: Tympanic membrane normal.     Left Ear: Tympanic membrane normal.  Eyes:     Pupils: Pupils are equal, round, and reactive to light.      Comments: Abnormal skin lesion that is discolored   Neck:     Thyroid: No thyromegaly.  Cardiovascular:     Rate and Rhythm: Normal rate and regular rhythm.     Heart sounds: Normal heart sounds. No murmur heard. Pulmonary:     Effort: Pulmonary effort is normal. No respiratory distress.     Breath sounds: Normal breath sounds. No wheezing.  Abdominal:     General: Bowel sounds are normal. There is no distension.     Palpations: Abdomen is soft.     Tenderness:  There is no abdominal tenderness.  Musculoskeletal:        General: No tenderness. Normal range of motion.     Cervical back: Normal range  of motion and neck supple.  Skin:    General: Skin is warm and dry.  Neurological:     Mental Status: She is alert and oriented to person, place, and time.     Cranial Nerves: No cranial nerve deficit.     Deep Tendon Reflexes: Reflexes are normal and symmetric.  Psychiatric:        Behavior: Behavior normal.        Thought Content: Thought content normal.        Judgment: Judgment normal.       BP 129/73   Pulse 64   Temp 97.8 F (36.6 C) (Temporal)   Ht '5\' 1"'  (1.549 m)   Wt 163 lb (73.9 kg)   SpO2 99%   BMI 30.80 kg/m      Assessment & Plan:  Alexandria Irwin comes in today with chief complaint of Medical Management of Chronic Issues   Diagnosis and orders addressed:  1. Type 2 diabetes mellitus with other specified complication, without long-term current use of insulin (HCC) - Pt has not had [place] in last 3 months because it has not been shipped. Will discuss with clinical Pharm to see about patient assistance.  Strict low carb diet  - Bayer DCA Hb A1c Waived - CMP14+EGFR - CBC with Differential/Platelet  2. Hypertension associated with diabetes ([place]) - CMP14+EGFR - CBC with Differential/Platelet  3. Cirrhosis of liver without ascites, unspecified hepatic cirrhosis type (HCC) - CMP14+EGFR - CBC with Differential/Platelet  4. Fatty liver - CMP14+EGFR - CBC with Differential/Platelet  5. Hyperlipidemia associated with type 2 diabetes mellitus (HCC) - CMP14+EGFR - CBC with Differential/Platelet  6. Hypothyroidism, unspecified type - CMP14+EGFR - CBC with Differential/Platelet - TSH  7. Vitamin D deficiency  - CMP14+EGFR - CBC with Differential/Platelet  8. Obesity (BMI 30-39.9) - CMP14+EGFR - CBC with Differential/Platelet  9. B12 deficienc - CMP14+EGFR - CBC with Differential/Platelet  10. 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 Skin lesion - Ambulatory referral to Dermatology   Labs pending Health Maintenance reviewed Diet and exercise encouraged  Follow up plan: 3 months   Evelina Dun, FNP

## 2022-02-07 NOTE — Telephone Encounter (Signed)
Ok

## 2022-02-08 LAB — CBC WITH DIFFERENTIAL/PLATELET
Basophils Absolute: 0.1 10*3/uL (ref 0.0–0.2)
Basos: 1 %
EOS (ABSOLUTE): 0.2 10*3/uL (ref 0.0–0.4)
Eos: 3 %
Hematocrit: 42.3 % (ref 34.0–46.6)
Hemoglobin: 14.5 g/dL (ref 11.1–15.9)
Immature Grans (Abs): 0 10*3/uL (ref 0.0–0.1)
Immature Granulocytes: 0 %
Lymphocytes Absolute: 2.5 10*3/uL (ref 0.7–3.1)
Lymphs: 35 %
MCH: 32.8 pg (ref 26.6–33.0)
MCHC: 34.3 g/dL (ref 31.5–35.7)
MCV: 96 fL (ref 79–97)
Monocytes Absolute: 0.5 10*3/uL (ref 0.1–0.9)
Monocytes: 7 %
Neutrophils Absolute: 3.9 10*3/uL (ref 1.4–7.0)
Neutrophils: 54 %
Platelets: 189 10*3/uL (ref 150–450)
RBC: 4.42 x10E6/uL (ref 3.77–5.28)
RDW: 12.9 % (ref 11.7–15.4)
WBC: 7.1 10*3/uL (ref 3.4–10.8)

## 2022-02-08 LAB — CMP14+EGFR
ALT: 17 IU/L (ref 0–32)
AST: 19 IU/L (ref 0–40)
Albumin/Globulin Ratio: 1.8 (ref 1.2–2.2)
Albumin: 4.6 g/dL (ref 3.8–4.8)
Alkaline Phosphatase: 42 IU/L — ABNORMAL LOW (ref 44–121)
BUN/Creatinine Ratio: 16 (ref 12–28)
BUN: 17 mg/dL (ref 8–27)
Bilirubin Total: 0.9 mg/dL (ref 0.0–1.2)
CO2: 26 mmol/L (ref 20–29)
Calcium: 9.8 mg/dL (ref 8.7–10.3)
Chloride: 99 mmol/L (ref 96–106)
Creatinine, Ser: 1.09 mg/dL — ABNORMAL HIGH (ref 0.57–1.00)
Globulin, Total: 2.5 g/dL (ref 1.5–4.5)
Glucose: 224 mg/dL — ABNORMAL HIGH (ref 70–99)
Potassium: 4.8 mmol/L (ref 3.5–5.2)
Sodium: 140 mmol/L (ref 134–144)
Total Protein: 7.1 g/dL (ref 6.0–8.5)
eGFR: 52 mL/min/{1.73_m2} — ABNORMAL LOW (ref >59)

## 2022-02-08 LAB — TSH: TSH: 1.44 u[IU]/mL (ref 0.450–4.500)

## 2022-02-13 DIAGNOSIS — L989 Disorder of the skin and subcutaneous tissue, unspecified: Secondary | ICD-10-CM

## 2022-02-15 ENCOUNTER — Telehealth: Payer: Self-pay

## 2022-02-15 NOTE — Chronic Care Management (AMB) (Signed)
  Chronic Care Management   Note  02/15/2022 Name: Alexandria Irwin MRN: [id number] DOB: 12-05-1945  Alexandria Irwin is a 76 y.o. year old female who is a primary care patient of Sharion Balloon, FNP. I reached out to Alexandria Irwin by phone today in response to a referral sent by Ms. [place] PCP.  Ms. Boudreau was given information about Chronic Care Management services today including:  CCM service includes personalized support from designated clinical staff supervised by her physician, including individualized plan of care and coordination with other care providers 24/7 contact phone numbers for assistance for urgent and routine care needs. Service will only be billed when office clinical staff spend 20 minutes or more in a month to coordinate care. Only one practitioner may furnish and bill the service in a calendar month. The patient may stop CCM services at any time (effective at the end of the month) by phone call to the office staff. The patient is responsible for co-pay (up to 20% after annual deductible is met) if co-pay is required by the individual health plan.   Patient agreed to services and verbal consent obtained.   Follow up plan: Telephone appointment with care management team member scheduled for:03/27/2022  Noreene Larsson, [place], [place] [address] Direct Dial: [phone] [email or web address]

## 2022-02-26 ENCOUNTER — Other Ambulatory Visit: Payer: Self-pay | Admitting: Family

## 2022-02-26 DIAGNOSIS — E1169 Type 2 diabetes mellitus with other specified complication: Secondary | ICD-10-CM

## 2022-03-07 DIAGNOSIS — B351 Tinea unguium: Secondary | ICD-10-CM | POA: Diagnosis not present

## 2022-03-07 DIAGNOSIS — L84 Corns and callosities: Secondary | ICD-10-CM | POA: Diagnosis not present

## 2022-03-07 DIAGNOSIS — M79676 Pain in unspecified toe(s): Secondary | ICD-10-CM | POA: Diagnosis not present

## 2022-03-07 DIAGNOSIS — E1142 Type 2 diabetes mellitus with diabetic polyneuropathy: Secondary | ICD-10-CM | POA: Diagnosis not present

## 2022-03-12 ENCOUNTER — Telehealth: Payer: Self-pay | Admitting: *Deleted

## 2022-03-12 ENCOUNTER — Telehealth: Payer: Self-pay

## 2022-03-12 NOTE — Telephone Encounter (Signed)
Enid Derry from ([facility]) called office.  Pt is scheduled for a follow up on 05/17/22  at 1:30pm with Dr. Berline Lopes.  Enid Derry to notify pt of appointment date and time.

## 2022-03-12 NOTE — Telephone Encounter (Signed)
CALLED PATIENT TO INFORM OF APPT. WITH DR. Berline Lopes ON 11-17-123- ARRIVAL TIME- 1:15 PM, SPOKE WITH PATIENT AND SHE IS AWARE OF THIS APPT.

## 2022-03-17 ENCOUNTER — Other Ambulatory Visit: Payer: Self-pay | Admitting: Family

## 2022-03-17 DIAGNOSIS — R42 Dizziness and giddiness: Secondary | ICD-10-CM

## 2022-03-18 ENCOUNTER — Other Ambulatory Visit: Payer: Self-pay | Admitting: Family

## 2022-03-18 DIAGNOSIS — R42 Dizziness and giddiness: Secondary | ICD-10-CM

## 2022-03-27 ENCOUNTER — Ambulatory Visit (INDEPENDENT_AMBULATORY_CARE_PROVIDER_SITE_OTHER): Payer: Medicare Other | Admitting: Pharmacist

## 2022-03-27 DIAGNOSIS — E785 Hyperlipidemia, unspecified: Secondary | ICD-10-CM | POA: Diagnosis not present

## 2022-03-27 DIAGNOSIS — E1169 Type 2 diabetes mellitus with other specified complication: Secondary | ICD-10-CM | POA: Diagnosis not present

## 2022-03-27 MED ORDER — DAPAGLIFLOZIN PROPANEDIOL 10 MG PO TABS: 10.0000 mg | ORAL_TABLET | Freq: Every day | ORAL | 11 refills | Status: DC

## 2022-03-27 NOTE — Progress Notes (Signed)
    03/27/2022 Name: Alexandria Irwin MRN: [id number] DOB: 05-09-1946   S:  76 yoF Presents for diabetes evaluation, education, and management Patient was referred and last seen by Primary Care Provider on 02/07/2022. She reports needing medication assistance for Farxiga.  She [place] previously [place], however they have lost her application status.  Insurance coverage/medication affordability: medicare  Patient reports adherence with medications. Current diabetes medications include: farxiga, glimepiride, metformin Current hypertension medications include: norvasc, losartan, metop Goal 130/80 Current hyperlipidemia medications include: simvastatin    Patient denies hypoglycemic events.   Patient reported dietary habits: Eats 2 meals/day Discussed meal planning options and Plate method for healthy eating Avoid sugary drinks and desserts Incorporate balanced protein, non starchy veggies, 1 serving of carbohydrate with each meal Increase water intake Increase physical activity as able  Patient-reported exercise habits: n/a   Patient reports nocturia (nighttime urination).  Patient denies neuropathy (nerve pain).  Patient reports visual changes.  Patient reports self foot exams.    O:  Lab Results  Component Value Date   HGBA1C 7.5 (H) 02/07/2022   Lipid Panel     Component Value Date/Time   CHOL 136 04/30/2021 1016   TRIG 157 (H) 04/30/2021 1016   TRIG 182 (H) 02/18/2013 0947   HDL 47 04/30/2021 1016   HDL 41 02/18/2013 0947   CHOLHDL 2.9 04/30/2021 1016   LDLCALC 62 04/30/2021 1016   LDLCALC 38 02/18/2013 0947     Home fasting blood sugars: <140  2 hour post-meal/random blood sugars: n/a.    Clinical Atherosclerotic Cardiovascular Disease (ASCVD): No   The 10-year ASCVD risk score (Arnett DK, et al., 2019) is: 47.6%   Values used to calculate the score:     Age: 76 years     Sex: Female     Is Non-Hispanic African  American: No     Diabetic: Yes     Tobacco smoker: No     Systolic Blood Pressure: 412 mmHg     Is BP treated: Yes     HDL Cholesterol: 47 mg/dL     Total Cholesterol: 136 mg/dL    A/P:  Diabetes T2DM -- A1c 7.8%, GFR 52.  A1c acceptable in this 76 yoF.  Patient is adherent with medication.   -continue farxiga '10mg'$  daily  [place]   Must escribe Wilder Glade to [employer]  Should auto-enroll patient for 2024  -continue glimepiride   -continue metformin  -patient would like to adhere to current regimen  Consider DPP4 if not controlled at follow up  A1c 7.5% acceptable in 76 yoF  -Extensively discussed pathophysiology of diabetes, recommended lifestyle interventions, dietary effects on blood sugar control  -Counseled on s/sx of and management of hypoglycemia  -Next A1C anticipated 05/2022    Written patient instructions provided.  Total time in face to face counseling 20 minutes.     Regina Eck, PharmD, BCPS Clinical Pharmacist, [place]  [facility] Phone [phone]

## 2022-04-15 ENCOUNTER — Other Ambulatory Visit: Payer: Self-pay | Admitting: Family

## 2022-04-15 DIAGNOSIS — E559 Vitamin D deficiency, unspecified: Secondary | ICD-10-CM

## 2022-04-18 ENCOUNTER — Ambulatory Visit (INDEPENDENT_AMBULATORY_CARE_PROVIDER_SITE_OTHER): Payer: Medicare Other | Admitting: Family

## 2022-04-18 ENCOUNTER — Encounter: Payer: Self-pay | Admitting: Family

## 2022-04-18 DIAGNOSIS — B3731 Acute candidiasis of vulva and vagina: Secondary | ICD-10-CM

## 2022-04-18 MED ORDER — FLUCONAZOLE 150 MG PO TABS: 150.0000 mg | ORAL_TABLET | ORAL | 0 refills | Status: DC | PRN

## 2022-04-18 NOTE — Progress Notes (Signed)
Virtual Visit  Note Due to COVID-19 pandemic this visit was conducted virtually. This visit type was conducted due to national recommendations for restrictions regarding the COVID-19 Pandemic (e.g. social distancing, sheltering in place) in an effort to limit this patient's exposure and mitigate transmission in our community. All issues noted in this document were discussed and addressed.  A physical exam was not performed with this format.  I connected with Alexandria Irwin on 04/18/22 at 12:49 pm by telephone and verified that I am speaking with the correct person using two identifiers. Alexandria Irwin is currently located at home and  no one is currently with her during visit. The provider, Evelina Dun, FNP is located in their office at time of visit.  I discussed the limitations, risks, security and privacy concerns of performing an evaluation and management service by telephone and the availability of in person appointments. I also discussed with the patient that there may be a patient responsible charge related to this service. The patient expressed understanding and agreed to proceed.  Alexandria Irwin, Alexandria Irwin are scheduled for a virtual visit with your provider today.    Just as we do with appointments in the office, we must obtain your consent to participate.  Your consent will be active for this visit and any virtual visit you may have with one of our providers in the next 365 days.    If you have a MyChart account, I can also send a copy of this consent to you electronically.  All virtual visits are billed to your insurance company just like a traditional visit in the office.  As this is a virtual visit, video technology does not allow for your provider to perform a traditional examination.  This may limit your provider's ability to fully assess your condition.  If your provider identifies any concerns that need to be evaluated in person or the need to arrange testing such as labs, EKG, etc, we will make  arrangements to do so.    Although advances in technology are sophisticated, we cannot ensure that it will always work on either your end or our end.  If the connection with a video visit is poor, we may have to switch to a telephone visit.  With either a video or telephone visit, we are not always able to ensure that we have a secure connection.   I need to obtain your verbal consent now.   Are you willing to proceed with your visit today?   Alexandria Irwin has provided verbal consent on 04/18/2022 for a virtual visit (video or telephone).   Evelina Dun, [place] 04/18/2022  12:51 PM   History and Present Illness:  Vaginal Discharge The patient's primary symptoms include genital itching and vaginal discharge. This is a new problem. The current episode started yesterday. The problem has been gradually worsening. The pain is mild. The vaginal discharge was yellow. There has been no bleeding.      Review of Systems  Genitourinary:  Positive for vaginal discharge.     Observations/Objective: No SOB or distress noted   Assessment and Plan: 1. Vagina, candidiasis Keep clean and dry Start Diflucan  Follow up if symptoms worsen or do not improve  - fluconazole (DIFLUCAN) 150 MG tablet; Take 1 tablet (150 mg total) by mouth every three (3) days as needed.  Dispense: 3 tablet; Refill: 0      I discussed the assessment and treatment plan with the patient. The patient was provided an opportunity  to ask questions and all were answered. The patient agreed with the plan and demonstrated an understanding of the instructions.   The patient was advised to call back or seek an in-person evaluation if the symptoms worsen or if the condition fails to improve as anticipated.  The above assessment and management plan was discussed with the patient. The patient verbalized understanding of and has agreed to the management plan. Patient is aware to call the clinic if symptoms persist or worsen. Patient is  aware when to return to the clinic for a follow-up visit. Patient educated on when it is appropriate to go to the emergency department.   Time call ended:  1:00 pm   I provided 11 minutes of  non face-to-face time during this encounter.    Evelina Dun, FNP

## 2022-04-18 NOTE — Patient Instructions (Signed)

## 2022-04-22 DIAGNOSIS — L820 Inflamed seborrheic keratosis: Secondary | ICD-10-CM | POA: Diagnosis not present

## 2022-04-22 DIAGNOSIS — C44319 Basal cell carcinoma of skin of other parts of face: Secondary | ICD-10-CM | POA: Diagnosis not present

## 2022-05-06 ENCOUNTER — Other Ambulatory Visit: Payer: Self-pay | Admitting: Family

## 2022-05-06 DIAGNOSIS — E1169 Type 2 diabetes mellitus with other specified complication: Secondary | ICD-10-CM

## 2022-05-07 ENCOUNTER — Telehealth: Payer: Self-pay

## 2022-05-07 NOTE — Telephone Encounter (Signed)
Mailed [place] renewal application for Alexandria Irwin to patients home.

## 2022-05-10 ENCOUNTER — Ambulatory Visit (INDEPENDENT_AMBULATORY_CARE_PROVIDER_SITE_OTHER): Payer: Medicare Other | Admitting: Family

## 2022-05-10 ENCOUNTER — Encounter: Payer: Self-pay | Admitting: Family

## 2022-05-10 VITALS — BP 119/73 | HR 61 | Temp 97.1°F | Ht 61.0 in | Wt 160.8 lb

## 2022-05-10 DIAGNOSIS — G620 Drug-induced polyneuropathy: Secondary | ICD-10-CM

## 2022-05-10 DIAGNOSIS — I152 Hypertension secondary to endocrine disorders: Secondary | ICD-10-CM

## 2022-05-10 DIAGNOSIS — E039 Hypothyroidism, unspecified: Secondary | ICD-10-CM | POA: Diagnosis not present

## 2022-05-10 DIAGNOSIS — R5383 Other fatigue: Secondary | ICD-10-CM

## 2022-05-10 DIAGNOSIS — Z23 Encounter for immunization: Secondary | ICD-10-CM | POA: Diagnosis not present

## 2022-05-10 DIAGNOSIS — T451X5A Adverse effect of antineoplastic and immunosuppressive drugs, initial encounter: Secondary | ICD-10-CM | POA: Diagnosis not present

## 2022-05-10 DIAGNOSIS — C541 Malignant neoplasm of endometrium: Secondary | ICD-10-CM | POA: Diagnosis not present

## 2022-05-10 DIAGNOSIS — E1159 Type 2 diabetes mellitus with other circulatory complications: Secondary | ICD-10-CM

## 2022-05-10 DIAGNOSIS — E538 Deficiency of other specified B group vitamins: Secondary | ICD-10-CM | POA: Diagnosis not present

## 2022-05-10 DIAGNOSIS — K7460 Unspecified cirrhosis of liver: Secondary | ICD-10-CM | POA: Diagnosis not present

## 2022-05-10 DIAGNOSIS — E785 Hyperlipidemia, unspecified: Secondary | ICD-10-CM

## 2022-05-10 DIAGNOSIS — E1169 Type 2 diabetes mellitus with other specified complication: Secondary | ICD-10-CM

## 2022-05-10 DIAGNOSIS — E559 Vitamin D deficiency, unspecified: Secondary | ICD-10-CM

## 2022-05-10 DIAGNOSIS — E669 Obesity, unspecified: Secondary | ICD-10-CM

## 2022-05-10 LAB — BAYER DCA HB A1C WAIVED: HB A1C (BAYER DCA - WAIVED): 7.3 % — ABNORMAL HIGH (ref 4.8–5.6)

## 2022-05-10 NOTE — Patient Instructions (Signed)
Basal Cell Carcinoma Basal cell carcinoma is the most common form of skin cancer. It begins in the basal cells, which are at the bottom of the outer skin layer (epidermis). Basal cell carcinoma can often be cured. It rarely spreads to other areas of the body (metastasizes). It may come back at the same location (recur), but it can be treated again if this happens. Basal cell carcinoma occurs most often on parts of the body that are frequently exposed to the sun, such as: Parts of the head, including the scalp or face. Ears. Neck. Arms or legs. Backs of the hands. What are the causes? This condition is usually caused by exposure to ultraviolet (UV) light. UV light may come from the sun or from tanning beds. Other causes include: Exposure to arsenic, a highly poisonous metal. Exposure to high-energy X-rays (radiation). Exposure to toxic tars and oils. Certain genetic conditions, such as a condition that makes a person sensitive to sunlight. What increases the risk? You are more likely to develop this condition if: You are older than 78 years of age. You have: Fair skin (light complexion), blond or red hair, or blue, green, or gray eye color. Childhood freckling. Had repeated sunburns or sun exposure over long periods of time, especially during childhood. A weakened body defense system (immune system). Been exposed to certain chemicals, such as tar, soot, and arsenic. Chronic inflammatory conditions or infections. A family or personal history of basal cell carcinoma. You use tanning beds. What are the signs or symptoms? The main symptom of this condition is a growth or lesion on the skin. The shape and color of the growth or lesion may vary. The main types include: An open sore that may remain open for three weeks or longer. The sore may bleed or crust. This type of lesion can be an early sign of basal cell carcinoma. Basal cell carcinoma often shows up as a sore that does not heal. A  reddish area that may crust, itch, or cause discomfort. This may occur on areas that are exposed to the sun. These patches might be easier to feel than to see. A shiny or clear bump that is red, white, or pink. In people who have dark hair, the bump is often tan, black, or brown. These bumps can look like moles. A pink growth with a raised border. The growth will have a crusted and indented area in the center. Small blood vessels may appear on the surface of the growth as it gets bigger. A scar-like area that looks like shiny, stretched skin. The area may be white, yellow, or waxy. It often has irregular borders. This may be a sign of more aggressive basal cell carcinoma. How is this diagnosed? This condition may be diagnosed with: A physical exam. Removal of a tissue sample to be examined under a microscope (biopsy). How is this treated? Treatment for this condition involves removing the cancerous tissue. The method that is used for this depends on the type, size, location, and number of tumors. Possible treatments include: Surgery, such as: Mohs surgery. In this procedure, the cancerous skin cells are removed layer by layer until all of the tumor has been removed. Surgical removal (excision) of the tumor. This involves removing the entire tumor and a small amount of normal skin that surrounds it. Cryosurgery. This involves freezing the tumor with liquid nitrogen. Plastic surgery. The tumor is removed, and healthy skin from another part of the body is used to cover the wound. This   may be done for large tumors that are in areas where it is not possible to stretch the nearby skin to sew the edges of the wound together. Therapies or treatments, such as: Radiation. This may be used for tumors on the face. Photodynamic therapy. A chemical cream is applied to the skin, and light exposure is used to activate the chemical. Electrodesiccation and curettage. This involves alternately scraping and burning  the tumor while using an electric current to control bleeding. Chemical treatments, such as imiquimod cream and interferon injections. These may be used to remove superficial tumors with minimal scarring. Follow these instructions at home: Avoid direct exposure to the sun. Do self-exams as told by your health care provider. Look for new spots or changes in your skin. Keep all follow-up visits. This is important. How is this prevented?  Avoid the sun when it is the strongest. This is usually between 10 a.m. and 4 p.m. When you are out in the sun, use a sunscreen that has a sun protection factor (SPF) of at least 30. Apply sunscreen at least 30 minutes before exposure to the sun. Reapply sunscreen every 2-4 hours while you are outside. Also reapply it after swimming and after excessive sweating. Always wear hats, protective clothing, and UV-blocking sunglasses when you are outdoors. Do not use tanning beds. Contact a health care provider if: You notice any new spots or any changes in your skin. You have had a basal cell carcinoma tumor removed, and you notice a new growth in the same location. Get help right away if: You have a spot that is sore and does not heal. You have a spot that bleeds easily. Summary Basal cell carcinoma is the most common form of skin cancer. It begins in the bottom of the outer skin layer (epidermis). Basal cell carcinoma can almost always be cured. This condition is usually caused by exposure to ultraviolet (UV) light. It mostly affects the face, scalp, neck, ears, arms, legs, or backs of the hands. The main symptom of this condition is a growth or lesion on the skin that can vary in shape and color. You can prevent this cancer by avoiding direct exposure to the sun, applying sunscreen of at least 30 SPF, and wearing protective clothing. Apply sunscreen 30 minutes before you go out into the sun, and reapply every 2-4 hours while you are outside. This information is  not intended to replace advice given to you by your health care provider. Make sure you discuss any questions you have with your health care provider. Document Revised: 10/19/2020 Document Reviewed: 10/19/2020 Elsevier Patient Education  2023 [employer]  

## 2022-05-10 NOTE — Progress Notes (Signed)
Subjective:    Patient ID: Alexandria Irwin, female    DOB: [birth year], 76 y.o.   MRN: [id number]  Chief Complaint  Patient presents with   Medical Management of Chronic Issues    Flu shot today. Patient has skin cancer on face she will be getting surgery on that    Pt presents to the office today for chronic follow up. 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 She has completed chemo and radiation. 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    She is followed by Dermatologists for basal skin cancer on her face and will have it removed soon.  Hypertension This is a chronic problem. The current episode started more than 1 year ago. The problem has been resolved since onset. The problem is controlled. Associated symptoms include malaise/fatigue. Pertinent negatives include no blurred vision, peripheral edema or shortness of breath. Risk factors for coronary artery disease include dyslipidemia, diabetes mellitus, obesity and sedentary lifestyle. The current treatment provides moderate improvement. Identifiable causes of hypertension include a thyroid problem.  Thyroid Problem Presents for follow-up visit. Symptoms include fatigue. Patient reports no anxiety, cold intolerance, constipation or diarrhea. The symptoms have been stable. Her past medical history is significant for hyperlipidemia.  Hyperlipidemia This is a chronic problem. The current episode started more than 1 year ago. The problem is controlled. Recent lipid tests were reviewed and are normal. Pertinent negatives include no shortness of breath. Current antihyperlipidemic treatment includes statins. The current treatment provides moderate improvement of lipids. Risk factors for coronary artery disease include dyslipidemia, diabetes mellitus, hypertension, a sedentary lifestyle and post-menopausal.  Diabetes She presents for her follow-up diabetic visit.  She has type 2 diabetes mellitus. Pertinent negatives for hypoglycemia include no nervousness/anxiousness. Associated symptoms include fatigue and foot paresthesias. Pertinent negatives for diabetes include no blurred vision. Symptoms are stable. Diabetic complications include peripheral neuropathy. Risk factors for coronary artery disease include dyslipidemia, diabetes mellitus, hypertension, sedentary lifestyle and post-menopausal. She is following a generally healthy diet. Her overall blood glucose range is 130-140 mg/dl.      Review of Systems  Constitutional:  Positive for fatigue and malaise/fatigue.  Eyes:  Negative for blurred vision.  Respiratory:  Negative for shortness of breath.   Gastrointestinal:  Negative for constipation and diarrhea.  Endocrine: Negative for cold intolerance.  Psychiatric/Behavioral:  The patient is not nervous/anxious.   All other systems reviewed and are negative.      Objective:   Physical Exam Vitals reviewed.  Constitutional:      General: She is not in acute distress.    Appearance: She is well-developed. She is obese.  HENT:     Head: Normocephalic and atraumatic.     Right Ear: Tympanic membrane normal.     Left Ear: Tympanic membrane normal.  Eyes:     Pupils: Pupils are equal, round, and reactive to light.  Neck:     Thyroid: No thyromegaly.  Cardiovascular:     Rate and Rhythm: Normal rate and regular rhythm.     Heart sounds: Normal heart sounds. No murmur heard. Pulmonary:     Effort: Pulmonary effort is normal. No respiratory distress.     Breath sounds: Normal breath sounds. No wheezing.  Abdominal:     General: Bowel sounds are normal. There is no distension.     Palpations: Abdomen is soft.     Tenderness: There is no abdominal tenderness.  Musculoskeletal:  General: No tenderness. Normal range of motion.     Cervical back: Normal range of motion and neck supple.     Right lower leg: Edema (trace) present.     Left  lower leg: Edema (trace) present.  Skin:    General: Skin is warm and dry.  Neurological:     Mental Status: She is alert and oriented to person, place, and time.     Cranial Nerves: No cranial nerve deficit.     Deep Tendon Reflexes: Reflexes are normal and symmetric.  Psychiatric:        Behavior: Behavior normal.        Thought Content: Thought content normal.        Judgment: Judgment normal.       BP 119/73   Pulse 61   Temp (!) 97.1 F (36.2 C) (Temporal)   Ht _0  (1.549 m)   Wt 160 lb 12.8 oz (72.9 kg)   SpO2 99%   BMI 30.38 kg/m      Assessment & Plan:  Alexandria Irwin comes in today with chief complaint of Medical Management of Chronic Issues (Flu shot today. Patient has skin cancer on face she will be getting surgery on that )   Diagnosis and orders addressed:  1. Need for immunization against influenza - Flu Vaccine QUAD High Dose(Fluad) - CMP14+EGFR  2. Hypertension associated with diabetes ([place]) - CMP14+EGFR  3. Type 2 diabetes mellitus with other specified complication, without long-term current use of insulin ([facility]) - Bayer DCA Hb A1c Waived - CMP14+EGFR  4. Hypothyroidism, unspecified typ - CMP14+EGFR  5. Hyperlipidemia associated with type 2 diabetes mellitus ([facility]CC) - CMP14+EGFR  6. Endometrial adenocarcinoma ([facility]CC) - CMP14+EGFR  7. Vitamin D deficiency - CMP14+EGFR  8. Endometrial cancer (HCC) - CMP14+EGFR  9. Obesity (BMI 30-39.9) - CMP14+EGFR  10. Peripheral neuropathy due to chemotherapy ([facility]CC)  - CMP14+EGFR  11. Cirrhosis of liver without ascites, unspecified hepatic cirrhosis type ([place]) - CMP14+EGFR  12. B12 deficiency - Anemia Profile B  13. Other fatigue - Anemia Profile B   Labs pending Health Maintenance reviewed Diet and exercise encouraged  Follow up plan: 6 months    Evelina Dun, FNP

## 2022-05-11 LAB — ANEMIA PROFILE B
Basophils Absolute: 0 10*3/uL (ref 0.0–0.2)
Basos: 1 %
EOS (ABSOLUTE): 0.1 10*3/uL (ref 0.0–0.4)
Eos: 3 %
Ferritin: 20 ng/mL (ref 15–150)
Folate: 15.6 ng/mL (ref >3.0)
Hematocrit: 42.9 % (ref 34.0–46.6)
Hemoglobin: 14.6 g/dL (ref 11.1–15.9)
Immature Grans (Abs): 0 10*3/uL (ref 0.0–0.1)
Immature Granulocytes: 0 %
Iron Saturation: 27 % (ref 15–55)
Iron: 103 ug/dL (ref 27–139)
Lymphocytes Absolute: 2.1 10*3/uL (ref 0.7–3.1)
Lymphs: 37 %
MCH: 33 pg (ref 26.6–33.0)
MCHC: 34 g/dL (ref 31.5–35.7)
MCV: 97 fL (ref 79–97)
Monocytes Absolute: 0.4 10*3/uL (ref 0.1–0.9)
Monocytes: 7 %
Neutrophils Absolute: 3 10*3/uL (ref 1.4–7.0)
Neutrophils: 52 %
Platelets: 195 10*3/uL (ref 150–450)
RBC: 4.43 x10E6/uL (ref 3.77–5.28)
RDW: 12.6 % (ref 11.7–15.4)
Retic Ct Pct: 1.5 % (ref 0.6–2.6)
Total Iron Binding Capacity: 381 ug/dL (ref 250–450)
UIBC: 278 ug/dL (ref 118–369)
Vitamin B-12: >2000 pg/mL — ABNORMAL HIGH (ref 232–1245)
WBC: 5.7 10*3/uL (ref 3.4–10.8)

## 2022-05-11 LAB — CMP14+EGFR
ALT: 16 IU/L (ref 0–32)
AST: 20 IU/L (ref 0–40)
Albumin/Globulin Ratio: 2 (ref 1.2–2.2)
Albumin: 4.7 g/dL (ref 3.8–4.8)
Alkaline Phosphatase: 39 IU/L — ABNORMAL LOW (ref 44–121)
BUN/Creatinine Ratio: 13 (ref 12–28)
BUN: 14 mg/dL (ref 8–27)
Bilirubin Total: 0.9 mg/dL (ref 0.0–1.2)
CO2: 24 mmol/L (ref 20–29)
Calcium: 10.3 mg/dL (ref 8.7–10.3)
Chloride: 101 mmol/L (ref 96–106)
Creatinine, Ser: 1.04 mg/dL — ABNORMAL HIGH (ref 0.57–1.00)
Globulin, Total: 2.4 g/dL (ref 1.5–4.5)
Glucose: 183 mg/dL — ABNORMAL HIGH (ref 70–99)
Potassium: 4 mmol/L (ref 3.5–5.2)
Sodium: 142 mmol/L (ref 134–144)
Total Protein: 7.1 g/dL (ref 6.0–8.5)
eGFR: 55 mL/min/{1.73_m2} — ABNORMAL LOW (ref >59)

## 2022-05-15 ENCOUNTER — Encounter: Payer: Self-pay | Admitting: Gynecologic Oncology

## 2022-05-16 DIAGNOSIS — B351 Tinea unguium: Secondary | ICD-10-CM | POA: Diagnosis not present

## 2022-05-16 DIAGNOSIS — E1142 Type 2 diabetes mellitus with diabetic polyneuropathy: Secondary | ICD-10-CM | POA: Diagnosis not present

## 2022-05-16 DIAGNOSIS — M79676 Pain in unspecified toe(s): Secondary | ICD-10-CM | POA: Diagnosis not present

## 2022-05-16 DIAGNOSIS — L84 Corns and callosities: Secondary | ICD-10-CM | POA: Diagnosis not present

## 2022-05-17 ENCOUNTER — Inpatient Hospital Stay: Payer: Medicare Other | Attending: Gynecologic Oncology | Admitting: Gynecologic Oncology

## 2022-05-17 ENCOUNTER — Encounter: Payer: Self-pay | Admitting: Gynecologic Oncology

## 2022-05-17 ENCOUNTER — Other Ambulatory Visit: Payer: Self-pay

## 2022-05-17 VITALS — BP 115/67 | HR 57 | Temp 98.1°F | Resp 18 | Ht 60.43 in | Wt 161.6 lb

## 2022-05-17 DIAGNOSIS — C541 Malignant neoplasm of endometrium: Secondary | ICD-10-CM

## 2022-05-17 DIAGNOSIS — Z08 Encounter for follow-up examination after completed treatment for malignant neoplasm: Secondary | ICD-10-CM | POA: Insufficient documentation

## 2022-05-17 DIAGNOSIS — Z923 Personal history of irradiation: Secondary | ICD-10-CM | POA: Diagnosis not present

## 2022-05-17 DIAGNOSIS — Z90722 Acquired absence of ovaries, bilateral: Secondary | ICD-10-CM | POA: Insufficient documentation

## 2022-05-17 DIAGNOSIS — Z90710 Acquired absence of both cervix and uterus: Secondary | ICD-10-CM | POA: Insufficient documentation

## 2022-05-17 DIAGNOSIS — Z9221 Personal history of antineoplastic chemotherapy: Secondary | ICD-10-CM | POA: Diagnosis not present

## 2022-05-17 DIAGNOSIS — Z8542 Personal history of malignant neoplasm of other parts of uterus: Secondary | ICD-10-CM | POA: Diagnosis not present

## 2022-05-17 NOTE — Patient Instructions (Signed)
It was good to see you today.  I do not see or feel any evidence of cancer recurrence on your exam.  You will see Dr. Sondra Come in 6 months. I will see you for follow-up in 12 months.  Call sometime over the summer next year to schedule a visit to see me in November 2024.  As always, if you develop any new and concerning symptoms before your next visit, please call to see me sooner.

## 2022-05-17 NOTE — Progress Notes (Signed)
Gynecologic Oncology Return Clinic Visit  05/17/22  Reason for Visit: Follow-up in the setting of endometrial cancer   Treatment History: Oncology History Overview Note  endometroid [facility] by [facility] showed loss of nuclear expression, BRAF neg. Neg genetic testing   Endometrial adenocarcinoma ([place])  06/13/2017 Procedure   PROCEDURE: Hysteroscopy, D&C   SURGEON: Richard D. Deatra Ina M.D.    FINDINGS: Hypertrophic endometrial tissue, no polyps, no myoma      06/13/2017 Pathology Results   Endometrium, curettage ENDOMETRIOID ADENOCARCINOMA, FIGO GRADE 1   08/05/2017 Pathology Results   1. Lymph node, sentinel, biopsy, right external iliac - NO CARCINOMA IDENTIFIED IN ONE LYMPH NODE (0/1) - SEE COMMENT 2. Lymph node, sentinel, biopsy, left external iliac #1 - NO CARCINOMA IDENTIFIED IN ONE LYMPH NODE (0/1) - SEE COMMENT 3. Lymph node, sentinel, biopsy, left external iliac #2 - NO CARCINOMA IDENTIFIED IN ONE LYMPH NODE (0/1) - SEE COMMENT 4. Uterus +/- tubes/ovaries, neoplastic, ovaries UTERUS: - ENDOMETRIOID CARCINOMA, FIGO GRADE 2, INVOLVING THE ENTIRE POSTERIOR MYOMETRIUM WITH TUMOR PRESENT AT INKED SEROSAL SURFACE - SEE ONCOLOGY TABLE BELOW CERVIX: - NABOTHIAN CYSTS - NO DYSPLASIA OR MALIGNANCY IDENTIFIED BILATERAL OVARIES: - NO CARCINOMA IDENTIFIED BILATERAL FALLOPIAN TUBES: - ENDOMETRIOID CARCINOMA INVOLVING LEFT FALLOPIAN TUBE  1. , 2 and 3. Cytokeratin AE1/3 was performed on the sentinel lymph nodes to exclude micrometastasis; they are negative. 4. ONCOLOGY TABLE-UTERUS, CARCINOMA OR CARCINOSARCOMA Specimen: Uterus, bilateral fallopian tubes and ovaries Procedure: Total hysterectomy and bilateral salpingo-oophorectomy Histologic type: Endometrioid carcinoma, NOS Grade: FIGO Grade 2 Myometrial invasion: >50% (The posterior myometrium has full thickness involvement with tumor extending to the inked serosal surface) Uterine Serosa involvement: Present Cervical stromal  involvement: Not identified Other Tissue/Organ Involvement: Left fallopian tube Lymphovascular invasion: Not identified Peritoneal washings: Not submitted/unknown Lymph nodes: Examined: 3 Sentinel 0 Non-sentinel 3 Total Lymph nodes with metastasis: 0 Isolated tumor cells (< 0.2 mm): 0 Micrometastasis: (> 0.2 mm and < 2.0 mm): 0 Macrometastasis: (> 2.0 mm): 0 Extracapsular extension: N/A TNM code: pT3a, pN0 FIGO Stage (based on pathologic findings, needs clinical correlation): IIIa *Block 4I is representative of the tumor and suitable for additional studies, if requested. COMMENT: MMR (by immunohistochemistry) is pending and will be reported in an addendum.   08/05/2017 Surgery   Operation: Robotic-assisted laparoscopic total hysterectomy with bilateral salpingoophorectomy, SLN biopsy, umbilical hernia   Surgeon: Donaciano Eva    Operative Findings:  : 6cm normal appearing uterus, normal right tube and ovary, left ovary with cystic mass, left sigmoid colon diverticular disease, adhesions to left ovary.  No gross extrauterine disease.       08/14/2017 Imaging   1. Interval hysterectomy. No findings of recurrent or metastatic disease. 2. Small volume pelvic fluid and a presumed postoperative seroma or lymphangioma within the left hemipelvis. 3. Subtle irregular hepatic capsule is suspicious for mild cirrhosis. Correlate with risk factors. 4. Coronary artery atherosclerosis. Aortic Atherosclerosis (ICD10-I70.0).   08/27/2017 Procedure   Successful placement of a right internal jugular approach power injectable Port-A-Cath. The catheter is ready for immediate use.   09/02/2017 - 12/16/2017 Chemotherapy   The patient had carboplatin and Taxol x 6 cycles   09/11/2017 Genetic Testing   Patient has genetic testing done for: The genes analyzed were the 83 genes on [facility]'s Multi-Cancer panel (ALK, APC, ATM, AXIN2, BAP1, BARD1, BLM, BMPR1A, BRCA1, BRCA2, BRIP1, CASR, CDC73, CDH1, CDK4,  CDKN1B, CDKN1C, CDKN2A, CEBPA, CHEK2, CTNNA1, DICER1, DIS3L2, EGFR, EPCAM, FH, FLCN, GATA2, GPC3, GREM1, HOXB13, HRAS, KIT, MAX,  MEN1, MET, MITF, MLH1, MSH2, MSH3, MSH6, MUTYH, NBN, NF1, NF2, NTHL1, PALB2, PDGFRA, PHOX2B, PMS2, POLD1, POLE, POT1, PRKAR1A, PTCH1, PTEN, RAD50, RAD51C, RAD51D, RB1, RECQL4, RET, RUNX1, SDHA, SDHAF2, SDHB, SDHC, SDHD, SMAD4, SMARCA4, SMARCB1, SMARCE1, STK11, SUFU, TERC, TERT, TMEM127, TP53, TSC1, TSC2, VHL, WRN, WT1). Results revealed patient has no detectable mutation   09/18/2017 - 10/13/2017 Radiation Therapy   Radiation treatment dates:  09/18/17-10/13/17   Site/dose:  Vaginal cuff/ 30 Gy in 5 fractions   Beams/energy:  HDR Ir-192 Vaginal/ Iridium-192    01/13/2018 Imaging   1. Indistinct right pelvic sidewall density measuring up to 5.1 cm in long axis, possibly from adenopathy or recurrent tumor, versus some form of complex postoperative fluid collection. PET-CT may be helpful in further assessment of this region for metabolic activity. Alternatively, surveillance might be considered. 2. Left adnexal or pelvic sidewall fluid density structural has enlarged, currently 67 cubic cm and previously about 42 cubic cm. This fluid density structure is more sharply defined and could be a postoperative seroma along the adnexa/pelvic sidewall. 3. Diffuse abnormal wall thickening in the urinary bladder favoring cystitis. I am unsure whether this is infectious or due to prior radiation therapy. Small amount of gas in the urinary bladder, correlate with any recent catheterization. 4. Diffuse wall thickening in the distal esophagus and gastroesophageal junction, probably from esophagitis. 5. Other imaging findings of potential clinical significance: Aortic Atherosclerosis (ICD10-I70.0). Coronary atherosclerosis. Upper normal splenic size. Sigmoid diverticulosis. Scattered terminal ileum diverticula.   04/11/2018 Imaging   CT imaging 1. Status post hysterectomy and bilateral  oophorectomy. Interval decrease in size of fluid density structures along both pelvic sidewalls. This favors postoperative seromas or lymphangioma. No findings to suggest recurrent or metastatic disease. 2. Bladder wall thickening again suggest cystitis. Gas within for which correlation with instrumentation is suggested. 3. Coronary artery atherosclerosis. Aortic Atherosclerosis (ICD10-I70.0). 4. Subtle irregular hepatic capsule, again suspicious for cirrhosis. 5. Cholecystectomy with similar mild common duct dilatation. Normal bilirubin on 07/30/2017 suggests this is within normal variation.     04/27/2018 Procedure   Successful right IJ vein Port-A-Cath explant.     Interval History: Doing well.  Had a recent biopsy on her face that diagnosed a skin cancer.  Waiting to see surgeon.  Has noticed some hearing loss.  Denies any vaginal bleeding or discharge.  Reports baseline bowel bladder function.  Denies any abdominal/pelvic pain.  Past Medical/Surgical History: Past Medical History:  Diagnosis Date   Cataract    Endometrial adenocarcinoma ([place])    Environmental and seasonal allergies    Fall 07/2019   Family history of ovarian cancer    Family history of pancreatic cancer    Fx anat neck humerus-open 07/2019   Genetic testing 09/19/2017   Multi-Cancer panel (83 genes) @ [facility] - No pathogenic mutations detected   GERD (gastroesophageal reflux disease)    History of DVT of lower extremity    early 2000s--- lower right leg   History of radiation therapy 09/18/2017-10/13/2017   vaginal brachytherapy (HDR) - endometrial      Dr Gery Pray   Hyperlipidemia    Hypertension    Hypothyroidism    Type 2 diabetes mellitus ([place])    followed by pcp   Umbilical hernia    Vitamin B 12 deficiency    Vitamin D deficiency    Wears glasses     Past Surgical History:  Procedure Laterality Date   BREAST BIOPSY Left 2009   benign   CATARACT EXTRACTION W/  INTRAOCULAR LENS IMPLANT Left  2018   CATARACT EXTRACTION W/PHACO Right 03/14/2014   Procedure: CATARACT EXTRACTION PHACO AND INTRAOCULAR LENS PLACEMENT ([place]);  Surgeon: Tonny Branch, MD;  Location: [facility];  Service: Ophthalmology;  Laterality: Right;  CDE: 8.34   COLONOSCOPY WITH PROPOFOL N/A 08/11/2020   Procedure: COLONOSCOPY WITH PROPOFOL;  Surgeon: Harvel Quale, MD;  Location: [facility];  Service: Gastroenterology;  Laterality: N/A;  7:30   FOOT GANGLION EXCISION Right 2013   HYSTEROSCOPY WITH D & C N/A 06/13/2017   Procedure: DILATATION AND CURETTAGE /HYSTEROSCOPY;  Surgeon: Alden Hipp, MD;  Location: [facility];  Service: Gynecology;  Laterality: N/A;   IR FLUORO GUIDE PORT INSERTION RIGHT  08/27/2017   IR REMOVAL TUN ACCESS W/ PORT W/O FL MOD SED  04/27/2018   IR US GUIDE VASC ACCESS RIGHT  08/27/2017   LAPAROSCOPIC CHOLECYSTECTOMY  2005   POLYPECTOMY  08/11/2020   Procedure: POLYPECTOMY INTESTINAL;  Surgeon: Harvel Quale, MD;  Location: [facility];  Service: Gastroenterology;;   ROBOTIC ASSISTED TOTAL HYSTERECTOMY WITH BILATERAL SALPINGO OOPHERECTOMY Bilateral 08/05/2017   Procedure: ROBOTIC ASSISTED TOTAL HYSTERECTOMY WITH BILATERAL SALPINGO OOPHORECTOMY;  Surgeon: Everitt Amber, MD;  Location: [facility];  Service: Gynecology;  Laterality: Bilateral;   SENTINEL NODE BIOPSY Bilateral 08/05/2017   Procedure: SENTINEL NODE BIOPSY;  Surgeon: Everitt Amber, MD;  Location: [facility];  Service: Gynecology;  Laterality: Bilateral;   UMBILICAL HERNIA REPAIR N/A 08/05/2017   Procedure: HERNIA REPAIR UMBILICAL ADULT;  Surgeon: Everitt Amber, MD;  Location: [facility];  Service: Gynecology;  Laterality: N/A;   VEIN SURGERY Right 2011   vein stripping-leg  and laser treatment    Family History  Problem Relation Age of Onset   Pancreatic cancer Mother 30       deceased 24   Ovarian cancer Mother        dx 37s   Cancer Other 53       maternal half-sister; unk. type    Social History   Socioeconomic History    Marital status: Widowed    Spouse name: Johnny   Number of children: 4   Years of education: Not on file   Highest education level: Some college, no degree  Occupational History   Occupation: retired  Tobacco Use   Smoking status: Never   Smokeless tobacco: Never  Vaping Use   Vaping Use: Never used  Substance and Sexual Activity   Alcohol use: No   Drug use: No   Sexual activity: Yes    Birth control/protection: Post-menopausal  Other Topics Concern   Not on file  Social History Narrative   Her husband passed 05/2020 - her daughter, Lattie Haw lives with her   Social Determinants of Health   Financial Resource Strain: [place]  (01/31/2022)   Overall Financial Resource Strain (CARDIA)    Difficulty of Paying Living Expenses: Not hard at all  Food Insecurity: No Food Insecurity (01/31/2022)   Hunger Vital Sign    Worried About Running Out of Food in the Last Year: Never true    [place] in the Last Year: Never true  Transportation Needs: No Transportation Needs (01/31/2022)   PRAPARE - Hydrologist (Medical): No    Lack of Transportation (Non-Medical): No  Physical Activity: Inactive (01/31/2022)   Exercise Vital Sign    Days of Exercise per Week: 0 days    Minutes of Exercise per Session: 0 min  Stress: No Stress  Concern Present (01/31/2022)   [place]    Feeling of Stress : Not at all  Social Connections: Socially Isolated (01/31/2022)   Social Connection and Isolation Panel [NHANES]    Frequency of Communication with Friends and Family: More than three times a week    Frequency of Social Gatherings with Friends and Family: More than three times a week    Attends Religious Services: Never    Marine scientist or Organizations: No    Attends Archivist Meetings: Never    Marital Status: Widowed    Current Medications:  Current Outpatient Medications:     acetaminophen (TYLENOL) 500 MG tablet, Take 1,000 mg by mouth every 8 (eight) hours as needed for mild pain or headache., Disp: , Rfl:    amLODipine (NORVASC) 5 MG tablet, TAKE 1 TABLET (5 MG TOTAL) BY MOUTH DAILY., Disp: 90 tablet, Rfl: 0   Ascorbic Acid (VITAMIN C) 100 MG CHEW, Chew 100 mg by mouth daily., Disp: , Rfl:    aspirin EC 81 MG tablet, Take 81 mg by mouth daily., Disp: , Rfl:    blood glucose meter kit and supplies, Dispense based on patient and insurance preference. Use up to four times daily as directed. (FOR ICD-10 E10.9, E11.9)., Disp: 1 each, Rfl: 0   Cholecalciferol 50 MCG (2000 UT) TABS, 2000 units by oral route., Disp: , Rfl:    cycloSPORINE (RESTASIS) 0.05 % ophthalmic emulsion, Place 1 drop into both eyes 2 (two) times daily. , Disp: , Rfl:    dapagliflozin propanediol (FARXIGA) 10 MG TABS tablet, Take 1 tablet (10 mg total) by mouth daily before breakfast., Disp: 90 tablet, Rfl: 11   fluconazole (DIFLUCAN) 150 MG tablet, Take 1 tablet (150 mg total) by mouth every three (3) days as needed., Disp: 3 tablet, Rfl: 0   furosemide (LASIX) 20 MG tablet, TAKE 1/2 TABLET BY MOUTH DAILY, Disp: 45 tablet, Rfl: 0   glimepiride (AMARYL) 4 MG tablet, TAKE 1 TABLET BY MOUTH EVERY DAY WITH BREAKFAST, Disp: 90 tablet, Rfl: 0   glucose blood (ONETOUCH VERIO) test strip, USE TO TEST BLOOD SUGAR DAILY Dx E11.9, Disp: 100 strip, Rfl: 3   Lancets (ONETOUCH DELICA PLUS [id number]) MISC, 1 each by Other route daily. Dx E11.9, Disp: 100 each, Rfl: 8   levothyroxine (SYNTHROID) 88 MCG tablet, TAKE 1 TABLET BY MOUTH EVERY DAY, Disp: 90 tablet, Rfl: 3   losartan (COZAAR) 100 MG tablet, Take 1 tablet by mouth daily., Disp: , Rfl:    meclizine (ANTIVERT) 25 MG tablet, TAKE 1 TABLET (25 MG TOTAL) BY MOUTH 2 (TWO) TIMES DAILY AS NEEDED FOR DIZZINESS., Disp: 60 tablet, Rfl: 2   metFORMIN (GLUCOPHAGE-XR) 500 MG 24 hr tablet, Take 2 tablets (1,000 mg total) by mouth daily with breakfast., Disp: 180 tablet, Rfl:  0   metoprolol succinate (TOPROL-XL) 50 MG 24 hr tablet, TAKE 1 TABLET (50 MG) BY MOUTH IN THE MORNING AND AT BEDTIME, Disp: 180 tablet, Rfl: 1   simvastatin (ZOCOR) 40 MG tablet, TAKE 1 TABLET BY MOUTH DAILY AT 6 PM., Disp: 90 tablet, Rfl: 2   Vitamin D, Ergocalciferol, (DRISDOL) 1.25 MG (50000 UNIT) CAPS capsule, TAKE 1 CAPSULE (50,000 UNITS TOTAL) BY MOUTH EVERY 7 (SEVEN) DAYS, Disp: 12 capsule, Rfl: 0  Review of Systems: + Hearing loss Denies appetite changes, fevers, chills, fatigue, unexplained weight changes. Denies neck lumps or masses, mouth sores, ringing in ears or voice changes. Denies  cough or wheezing.  Denies shortness of breath. Denies chest pain or palpitations. Denies leg swelling. Denies abdominal distention, pain, blood in stools, constipation, diarrhea, nausea, vomiting, or early satiety. Denies pain with intercourse, dysuria, frequency, hematuria or incontinence. Denies hot flashes, pelvic pain, vaginal bleeding or vaginal discharge.   Denies joint pain, back pain or muscle pain/cramps. Denies itching, rash, or wounds. Denies dizziness, headaches, numbness or seizures. Denies swollen lymph nodes or glands, denies easy bruising or bleeding. Denies anxiety, depression, confusion, or decreased concentration.  Physical Exam: BP 115/67 (BP Location: Left Arm, Patient Position: Sitting)   Pulse (!) 57   Temp 98.1 F (36.7 C) (Oral)   Resp 18   Ht 5' 0.43" (1.535 m)   Wt 161 lb 9.6 oz (73.3 kg)   SpO2 97%   BMI 31.11 kg/m  General: Alert, oriented, no acute distress. HEENT: Normocephalic, atraumatic, sclera anicteric. Chest: Clear to auscultation bilaterally.  Unlabored breathing on room air. Cardiovascular: Regular rate and rhythm, no murmurs. Abdomen: Obese, soft, nontender.  Normoactive bowel sounds.  No masses or hepatosplenomegaly appreciated.  Well-healed incisions.  Ventral hernia noted. Extremities: Grossly normal range of motion.  Warm, well perfused.  No  edema bilaterally. Skin: No rashes or lesions noted. Lymphatics: No cervical, supraclavicular, or inguinal adenopathy. GU: Normal appearing external genitalia without erythema, excoriation, or lesions.  Speculum exam reveals moderately atrophic vaginal mucosa with radiation changes, somewhat shortened vagina.  Cuff visually intact, no masses, somewhat foreshortened vagina.  Bimanual exam reveals cuff intact, no nodularity or masses.  Rectovaginal exam confirms findings  Laboratory & Radiologic Studies: None new  Assessment & Plan: Alexandria Irwin is a 76 y.o. 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 Genetic testing negative. S/p adjuvant carboplatin and paclitaxel chemotherapy x 6 cycles with adjuvant vaginal brachytherapy for local control completed in June, 2019 with a complete clinical response.   Patient is doing very well and is NED on exam today.    She is now 4.5 years out from completion of treatment.  We will continue with follow-up every 6 months until she is 5 years out from treatment and then transition to annual visits.  She will see Dr. Sondra Come next in 6 months and then me in a year.  At her next visit with me, she will be more than 5 years out from completion of treatment.  We will discuss at that point transitioning to yearly visits with an OB/GYN.  We reviewed signs and symptoms that would be concerning for disease recurrence and she knows to call if she develops any of these between visits.  20 minutes of total time was spent for this patient encounter, including preparation, face-to-face counseling with the patient and coordination of care, and documentation of the encounter.  Jeral Pinch, MD  Division of Gynecologic Oncology  Department of Obstetrics and Gynecology  [facility]

## 2022-05-27 ENCOUNTER — Other Ambulatory Visit: Payer: Self-pay | Admitting: Family

## 2022-05-27 DIAGNOSIS — E1169 Type 2 diabetes mellitus with other specified complication: Secondary | ICD-10-CM

## 2022-06-05 DIAGNOSIS — C441192 Basal cell carcinoma of skin of left lower eyelid, including canthus: Secondary | ICD-10-CM | POA: Diagnosis not present

## 2022-06-09 ENCOUNTER — Other Ambulatory Visit: Payer: Self-pay | Admitting: Family

## 2022-06-09 DIAGNOSIS — R42 Dizziness and giddiness: Secondary | ICD-10-CM

## 2022-06-11 ENCOUNTER — Other Ambulatory Visit: Payer: Self-pay | Admitting: Family

## 2022-06-11 DIAGNOSIS — R42 Dizziness and giddiness: Secondary | ICD-10-CM

## 2022-06-12 ENCOUNTER — Other Ambulatory Visit: Payer: Self-pay | Admitting: *Deleted

## 2022-06-12 DIAGNOSIS — B3731 Acute candidiasis of vulva and vagina: Secondary | ICD-10-CM

## 2022-06-13 ENCOUNTER — Other Ambulatory Visit: Payer: Self-pay | Admitting: Family

## 2022-06-13 DIAGNOSIS — E1159 Type 2 diabetes mellitus with other circulatory complications: Secondary | ICD-10-CM

## 2022-06-13 MED ORDER — FLUCONAZOLE 150 MG PO TABS: 150.0000 mg | ORAL_TABLET | ORAL | 0 refills | Status: DC | PRN

## 2022-07-04 DIAGNOSIS — H0270 Unspecified degenerative disorders of eyelid and periocular area: Secondary | ICD-10-CM | POA: Diagnosis not present

## 2022-07-04 DIAGNOSIS — H02423 Myogenic ptosis of bilateral eyelids: Secondary | ICD-10-CM | POA: Diagnosis not present

## 2022-07-04 DIAGNOSIS — C44319 Basal cell carcinoma of skin of other parts of face: Secondary | ICD-10-CM | POA: Diagnosis not present

## 2022-07-04 DIAGNOSIS — S01112S Laceration without foreign body of left eyelid and periocular area, sequela: Secondary | ICD-10-CM | POA: Diagnosis not present

## 2022-07-04 DIAGNOSIS — S01411S Laceration without foreign body of right cheek and temporomandibular area, sequela: Secondary | ICD-10-CM | POA: Diagnosis not present

## 2022-07-04 DIAGNOSIS — S01111S Laceration without foreign body of right eyelid and periocular area, sequela: Secondary | ICD-10-CM | POA: Diagnosis not present

## 2022-07-04 DIAGNOSIS — C441191 Basal cell carcinoma of skin of left upper eyelid, including canthus: Secondary | ICD-10-CM | POA: Diagnosis not present

## 2022-07-04 DIAGNOSIS — S0180XA Unspecified open wound of other part of head, initial encounter: Secondary | ICD-10-CM | POA: Diagnosis not present

## 2022-07-04 DIAGNOSIS — S01412S Laceration without foreign body of left cheek and temporomandibular area, sequela: Secondary | ICD-10-CM | POA: Diagnosis not present

## 2022-07-19 ENCOUNTER — Encounter: Payer: Self-pay | Admitting: Family

## 2022-07-19 ENCOUNTER — Ambulatory Visit (INDEPENDENT_AMBULATORY_CARE_PROVIDER_SITE_OTHER): Payer: Medicare Other | Admitting: Family

## 2022-07-19 VITALS — BP 118/75 | HR 64 | Temp 97.9°F | Ht 60.43 in | Wt 160.0 lb

## 2022-07-19 DIAGNOSIS — L439 Lichen planus, unspecified: Secondary | ICD-10-CM

## 2022-07-19 DIAGNOSIS — C441191 Basal cell carcinoma of skin of left upper eyelid, including canthus: Secondary | ICD-10-CM | POA: Diagnosis not present

## 2022-07-19 DIAGNOSIS — B3731 Acute candidiasis of vulva and vagina: Secondary | ICD-10-CM | POA: Diagnosis not present

## 2022-07-19 DIAGNOSIS — E039 Hypothyroidism, unspecified: Secondary | ICD-10-CM

## 2022-07-19 DIAGNOSIS — E1159 Type 2 diabetes mellitus with other circulatory complications: Secondary | ICD-10-CM | POA: Diagnosis not present

## 2022-07-19 DIAGNOSIS — I152 Hypertension secondary to endocrine disorders: Secondary | ICD-10-CM | POA: Diagnosis not present

## 2022-07-19 DIAGNOSIS — E1169 Type 2 diabetes mellitus with other specified complication: Secondary | ICD-10-CM | POA: Diagnosis not present

## 2022-07-19 DIAGNOSIS — N898 Other specified noninflammatory disorders of vagina: Secondary | ICD-10-CM | POA: Diagnosis not present

## 2022-07-19 DIAGNOSIS — Z01818 Encounter for other preprocedural examination: Secondary | ICD-10-CM

## 2022-07-19 LAB — CMP14+EGFR
ALT: 19 IU/L (ref 0–32)
AST: 21 IU/L (ref 0–40)
Albumin/Globulin Ratio: 1.8 (ref 1.2–2.2)
Albumin: 4.7 g/dL (ref 3.8–4.8)
Alkaline Phosphatase: 42 IU/L — ABNORMAL LOW (ref 44–121)
BUN/Creatinine Ratio: 11 — ABNORMAL LOW (ref 12–28)
BUN: 12 mg/dL (ref 8–27)
Bilirubin Total: 0.9 mg/dL (ref 0.0–1.2)
CO2: 22 mmol/L (ref 20–29)
Calcium: 9.9 mg/dL (ref 8.7–10.3)
Chloride: 101 mmol/L (ref 96–106)
Creatinine, Ser: 1.06 mg/dL — ABNORMAL HIGH (ref 0.57–1.00)
Globulin, Total: 2.6 g/dL (ref 1.5–4.5)
Glucose: 151 mg/dL — ABNORMAL HIGH (ref 70–99)
Potassium: 4.3 mmol/L (ref 3.5–5.2)
Sodium: 143 mmol/L (ref 134–144)
Total Protein: 7.3 g/dL (ref 6.0–8.5)
eGFR: 53 mL/min/{1.73_m2} — ABNORMAL LOW (ref >59)

## 2022-07-19 LAB — WET PREP FOR TRICH, YEAST, CLUE
Clue Cell Exam: NEGATIVE
Trichomonas Exam: NEGATIVE
Yeast Exam: POSITIVE — AB

## 2022-07-19 LAB — BAYER DCA HB A1C WAIVED: HB A1C (BAYER DCA - WAIVED): 7.4 % — ABNORMAL HIGH (ref 4.8–5.6)

## 2022-07-19 MED ORDER — FLUCONAZOLE 150 MG PO TABS: 150.0000 mg | ORAL_TABLET | ORAL | 0 refills | Status: DC | PRN

## 2022-07-19 MED ORDER — CLOBETASOL PROPIONATE 0.05 % EX CREA: 1.0000 | TOPICAL_CREAM | Freq: Two times a day (BID) | CUTANEOUS | 2 refills | Status: DC

## 2022-07-19 NOTE — Patient Instructions (Signed)
Lichen Planus Lichen planus is a skin problem that causes redness, itching, small bumps, and sores. It can affect the skin in any area of the body. Some common areas that are affected include: Arms, wrists, legs, or ankles. Chest, back, or abdomen. Genital areas. Gums and inside of the mouth. Scalp. Fingernails or toenails. Treatment can help control the symptoms of this condition. The condition can last for a long time. It can take 6-18 months or longer for it to go away. What are the causes? The exact cause of this condition is not known. The condition is not passed from one person to another (not contagious). It may be related to an allergy, a medicine, or an autoimmune response. An autoimmune response occurs when the body's defense system (immune system) mistakenly attacks healthy tissues. What increases the risk? The following factors may make you more likely to develop this condition: Being 30-60 years of age. Taking certain medicines. Having been exposed to certain dyes or chemicals. Having hepatitis C. What are the signs or symptoms? Symptoms of this condition may include: Itching, which can be severe. Small reddish or purple bumps on the skin. These may have flat tops and may be round or irregular shaped. Redness or white patches on the gums or tongue. Redness, soreness, or a burning feeling in the genital area. This may lead to pain or bleeding during sex. Changes in the fingernails or toenails. The nails may become thin or rough. They may have ridges in them. Redness or irritation of the eyes. This is rare. How is this diagnosed? This condition may be diagnosed based on: A physical exam. The health care provider will examine your affected skin and check for changes inside your mouth. Removal of a tissue sample to be looked at under a microscope (biopsy). How is this treated? Treatment for this condition may depend on the severity of symptoms. In some cases, no treatment is  needed. If treatment is needed to control symptoms, it may include: Creams or ointments (topical steroids) to help control itching and irritation. Medicine to be taken by mouth. Medicine to be taken by injection. A treatment in which your skin is exposed to ultraviolet light (phototherapy). Lozenges to suck on to help treat sores in the mouth. Follow these instructions at home:  Skin care Use creams or ointments as told by your health care provider. To soothe itching skin: Apply cool, wet cloths (cold compresses) to the affected area. Take cool baths. Add dry oatmeal to the water. Do not bathe in hot water. Do not scratch your skin. To help prevent scratching: Keep your fingernails short and clean. Cover the affected area with a bandage. Wash the affected area with mild soap and cool water as told by your health care provider. Keep the genital area as clean and dry as possible. General instructions Take or use over-the-counter and prescription medicines only as told by your health care provider. If you have sores in your mouth, avoid spicy and acidic foods as well as alcohol and tobacco. Keep all follow-up visits. Contact a health care provider if: You have increasing redness, swelling, or pain in the affected area. You have fluid, blood, or pus coming from the affected area. Your eyes become irritated. You have hair loss. Summary Lichen planus is a skin problem that causes redness, itching, small bumps, and sores. It can affect the skin in any area of the body. Do not scratch your skin. Apply cool, wet cloths (cold compresses) on the affected areas   or take cool baths to soothe itching. Take or use over-the-counter and prescription medicines only as told by your health care provider. Contact a health care provider if you have drainage from the affected area or have increasing redness, swelling, or pain in the area. Keep all follow-up visits. This information is not intended to  replace advice given to you by your health care provider. Make sure you discuss any questions you have with your health care provider. Document Revised: 08/15/2021 Document Reviewed: 08/15/2021 Elsevier Patient Education  2023 [employer]  

## 2022-07-19 NOTE — Progress Notes (Signed)
Subjective:    Patient ID: Alexandria Irwin, female    DOB: June 07, 1946, 77 y.o.   MRN: [id number]  Chief Complaint  Patient presents with   Pre-op Exam   PT presents to the office today for pre-op exam. She has been diagnosed with basal cell carcinoma around left eye. She is scheduled 07/30/22.  She has already stopped her aspirin 81 mg.  Hypertension This is a chronic problem. The current episode started more than 1 year ago. The problem has been resolved since onset. The problem is controlled. Pertinent negatives include no blurred vision, malaise/fatigue, peripheral edema or shortness of breath. Risk factors for coronary artery disease include dyslipidemia and obesity. The current treatment provides moderate improvement. Identifiable causes of hypertension include a thyroid problem.  Diabetes She presents for her follow-up diabetic visit. She has type 2 diabetes mellitus. Associated symptoms include fatigue. Pertinent negatives for diabetes include no blurred vision and no foot paresthesias. Symptoms are stable. Risk factors for coronary artery disease include dyslipidemia, diabetes mellitus, hypertension, sedentary lifestyle and post-menopausal. She is following a generally unhealthy diet. Her overall blood glucose range is 130-140 mg/dl.  Thyroid Problem Presents for follow-up visit. Symptoms include fatigue. Patient reports no diaphoresis or hoarse voice.  Vaginal Itching The patient's primary symptoms include genital itching. This is a chronic problem. The current episode started 1 to 4 weeks ago. The problem occurs intermittently. The problem has been waxing and waning. She has tried antifungals for the symptoms. The treatment provided moderate relief.      Review of Systems  Constitutional:  Positive for fatigue. Negative for diaphoresis and malaise/fatigue.  HENT:  Negative for hoarse voice.   Eyes:  Negative for blurred vision.  Respiratory:  Negative for shortness of breath.    All other systems reviewed and are negative.      Objective:   Physical Exam Vitals reviewed.  Constitutional:      General: She is not in acute distress.    Appearance: She is well-developed.  HENT:     Head: Normocephalic and atraumatic.  Eyes:     Pupils: Pupils are equal, round, and reactive to light.  Neck:     Thyroid: No thyromegaly.  Cardiovascular:     Rate and Rhythm: Normal rate and regular rhythm.     Heart sounds: Normal heart sounds. No murmur heard. Pulmonary:     Effort: Pulmonary effort is normal. No respiratory distress.     Breath sounds: Normal breath sounds. No wheezing.  Abdominal:     General: Bowel sounds are normal. There is no distension.     Palpations: Abdomen is soft.     Tenderness: There is no abdominal tenderness.  Musculoskeletal:        General: No tenderness. Normal range of motion.     Cervical back: Normal range of motion and neck supple.  Skin:    General: Skin is warm and dry.          Comments: Erythemas circular rash in right groin and right hand.   Abnormal skin lesion in corner of left eye  Neurological:     Mental Status: She is alert and oriented to person, place, and time.     Cranial Nerves: No cranial nerve deficit.     Deep Tendon Reflexes: Reflexes are normal and symmetric.  Psychiatric:        Behavior: Behavior normal.        Thought Content: Thought content normal.  Judgment: Judgment normal.      BP 118/75   Pulse 64   Temp 97.9 F (36.6 C) (Temporal)   Ht 5' 0.43" (1.535 m)   Wt 160 lb (72.6 kg)   SpO2 100%   BMI 30.80 kg/m      Assessment & Plan:  Alexandria Irwin comes in today with chief complaint of Pre-op Exam   Diagnosis and orders addressed:  1. Hypertension associated with diabetes ([place]) - CMP14+EGFR  2. Type 2 diabetes mellitus with other specified complication, without long-term current use of insulin ([facility]) - CMP14+EGFR - Bayer DCA Hb A1c Waived  3. Hypothyroidism, unspecified  type - CMP14+EGFR  4. Pre-op exam - CMP14+EGFR  5. Lichen planus - clobetasol cream (TEMOVATE) 0.05 %; Apply 1 Application topically 2 (two) times daily.  Dispense: 60 g; Refill: 2 - CMP14+EGFR  6. Vagina itching - WET PREP FOR TRICH, YEAST, CLUE - CMP14+EGFR  7. Vagina, candidiasis - fluconazole (DIFLUCAN) 150 MG tablet; Take 1 tablet (150 mg total) by mouth every three (3) days as needed.  Dispense: 3 tablet; Refill: 0  8. Basal cell carcinoma (BCC) of skin of left upper eyelid including canthus Pre-op form completed   Labs pending Health Maintenance reviewed Diet and exercise encouraged  Follow up plan: Keep chronic follow up   Evelina Dun, FNP

## 2022-07-25 DIAGNOSIS — M79676 Pain in unspecified toe(s): Secondary | ICD-10-CM | POA: Diagnosis not present

## 2022-07-25 DIAGNOSIS — B351 Tinea unguium: Secondary | ICD-10-CM | POA: Diagnosis not present

## 2022-07-25 DIAGNOSIS — L84 Corns and callosities: Secondary | ICD-10-CM | POA: Diagnosis not present

## 2022-07-25 DIAGNOSIS — E1142 Type 2 diabetes mellitus with diabetic polyneuropathy: Secondary | ICD-10-CM | POA: Diagnosis not present

## 2022-07-30 DIAGNOSIS — C441192 Basal cell carcinoma of skin of left lower eyelid, including canthus: Secondary | ICD-10-CM | POA: Diagnosis not present

## 2022-07-31 DIAGNOSIS — Z481 Encounter for planned postprocedural wound closure: Secondary | ICD-10-CM | POA: Diagnosis not present

## 2022-07-31 DIAGNOSIS — S01411S Laceration without foreign body of right cheek and temporomandibular area, sequela: Secondary | ICD-10-CM | POA: Diagnosis not present

## 2022-07-31 DIAGNOSIS — H02423 Myogenic ptosis of bilateral eyelids: Secondary | ICD-10-CM | POA: Diagnosis not present

## 2022-07-31 DIAGNOSIS — H0270 Unspecified degenerative disorders of eyelid and periocular area: Secondary | ICD-10-CM | POA: Diagnosis not present

## 2022-07-31 DIAGNOSIS — Z483 Aftercare following surgery for neoplasm: Secondary | ICD-10-CM | POA: Diagnosis not present

## 2022-07-31 DIAGNOSIS — C44319 Basal cell carcinoma of skin of other parts of face: Secondary | ICD-10-CM | POA: Diagnosis not present

## 2022-07-31 DIAGNOSIS — C441191 Basal cell carcinoma of skin of left upper eyelid, including canthus: Secondary | ICD-10-CM | POA: Diagnosis not present

## 2022-07-31 DIAGNOSIS — Z85828 Personal history of other malignant neoplasm of skin: Secondary | ICD-10-CM | POA: Diagnosis not present

## 2022-07-31 DIAGNOSIS — S01412S Laceration without foreign body of left cheek and temporomandibular area, sequela: Secondary | ICD-10-CM | POA: Diagnosis not present

## 2022-07-31 DIAGNOSIS — S0181XA Laceration without foreign body of other part of head, initial encounter: Secondary | ICD-10-CM | POA: Diagnosis not present

## 2022-07-31 DIAGNOSIS — S01112A Laceration without foreign body of left eyelid and periocular area, initial encounter: Secondary | ICD-10-CM | POA: Diagnosis not present

## 2022-08-04 ENCOUNTER — Other Ambulatory Visit: Payer: Self-pay | Admitting: Family

## 2022-08-04 DIAGNOSIS — E1169 Type 2 diabetes mellitus with other specified complication: Secondary | ICD-10-CM

## 2022-08-11 ENCOUNTER — Other Ambulatory Visit: Payer: Self-pay | Admitting: Family

## 2022-08-11 DIAGNOSIS — E119 Type 2 diabetes mellitus without complications: Secondary | ICD-10-CM

## 2022-08-12 ENCOUNTER — Other Ambulatory Visit: Payer: Self-pay | Admitting: Family

## 2022-08-12 MED ORDER — AMLODIPINE BESYLATE 5 MG PO TABS: 5.0000 mg | ORAL_TABLET | Freq: Every day | ORAL | 0 refills | Status: DC

## 2022-08-12 NOTE — Telephone Encounter (Signed)
From: [name] To: Office of [place], [place] Sent: 08/11/2022 6:52 PM EST Subject: Medication Renewal Request  Refills have been requested for the following medications:   amLODipine (NORVASC) 5 MG tablet [[name]]  [id number] [place] [employer]

## 2022-08-19 ENCOUNTER — Encounter (INDEPENDENT_AMBULATORY_CARE_PROVIDER_SITE_OTHER): Payer: Medicare Other

## 2022-08-19 DIAGNOSIS — B3731 Acute candidiasis of vulva and vagina: Secondary | ICD-10-CM

## 2022-08-19 MED ORDER — FLUCONAZOLE 150 MG PO TABS: 150.0000 mg | ORAL_TABLET | ORAL | 0 refills | Status: DC | PRN

## 2022-08-19 NOTE — Telephone Encounter (Signed)
Diflucan Prescription sent to pharmacy, we may need to stop the [place] if this continues.   Evelina Dun, FNP  Approximately 5 minutes was spent documenting and reviewing patient's chart.

## 2022-08-26 ENCOUNTER — Other Ambulatory Visit: Payer: Self-pay | Admitting: Family

## 2022-08-26 DIAGNOSIS — E1169 Type 2 diabetes mellitus with other specified complication: Secondary | ICD-10-CM

## 2022-09-02 ENCOUNTER — Encounter (INDEPENDENT_AMBULATORY_CARE_PROVIDER_SITE_OTHER): Payer: Medicare Other

## 2022-09-02 DIAGNOSIS — L739 Follicular disorder, unspecified: Secondary | ICD-10-CM

## 2022-09-02 DIAGNOSIS — B3731 Acute candidiasis of vulva and vagina: Secondary | ICD-10-CM

## 2022-09-03 ENCOUNTER — Ambulatory Visit: Payer: Medicare Other | Admitting: Family

## 2022-09-03 MED ORDER — FLUCONAZOLE 150 MG PO TABS: 150.0000 mg | ORAL_TABLET | ORAL | 0 refills | Status: DC | PRN

## 2022-09-03 MED ORDER — CEPHALEXIN 500 MG PO CAPS: 500.0000 mg | ORAL_CAPSULE | Freq: Three times a day (TID) | ORAL | 0 refills | Status: DC

## 2022-09-03 NOTE — Telephone Encounter (Signed)
Hello, I have sent in Keflex 500 mg three times a day for the sores. I have also refilled your diflucan as this may cause another yeast infection. Let me know if the sore do not improve. You may need to be seen in office.   Evelina Dun, FNP  Approximately 5 minutes was spent documenting and reviewing patient's chart.

## 2022-09-08 ENCOUNTER — Other Ambulatory Visit: Payer: Self-pay | Admitting: Family

## 2022-09-08 DIAGNOSIS — R42 Dizziness and giddiness: Secondary | ICD-10-CM

## 2022-09-09 NOTE — Telephone Encounter (Signed)
Last OV 07/19/22. Last RF 06/11/22 #60 with 2 refills. Next OV 11/07/22

## 2022-09-19 ENCOUNTER — Encounter: Payer: Self-pay | Admitting: Family

## 2022-09-19 ENCOUNTER — Ambulatory Visit (INDEPENDENT_AMBULATORY_CARE_PROVIDER_SITE_OTHER): Payer: Medicare Other | Admitting: Family

## 2022-09-19 VITALS — BP 118/73 | HR 72 | Temp 97.0°F | Ht 60.0 in | Wt 154.8 lb

## 2022-09-19 DIAGNOSIS — L03119 Cellulitis of unspecified part of limb: Secondary | ICD-10-CM

## 2022-09-19 DIAGNOSIS — L089 Local infection of the skin and subcutaneous tissue, unspecified: Secondary | ICD-10-CM

## 2022-09-19 MED ORDER — DOXYCYCLINE HYCLATE 100 MG PO TABS: 100.0000 mg | ORAL_TABLET | Freq: Two times a day (BID) | ORAL | 0 refills | Status: DC

## 2022-09-19 NOTE — Patient Instructions (Signed)
MRSA Infection, Diagnosis, Adult Methicillin-resistant Staphylococcus aureus (MRSA) infection is caused by bacteria called Staphylococcus aureus, or staph, that no longer respond to common antibiotic medicines (drug-resistant bacteria). MRSA infection can be hard to treat. Most of the time, MRSA can be on the skin or in the nose without causing problems (colonized). However, if MRSA enters the body through a cut, a sore, or an invasive medical device, it can cause a serious infection. What are the causes? This condition is caused by staph bacteria. Illness may develop after exposure to the bacteria through: Skin-to-skin contact with someone who is infected with MRSA. Touching surfaces that have the bacteria on them. Having a procedure or using equipment that allows MRSA to enter the body. Having MRSA that lives on your skin and then enters your body through: A cut or scratch. A surgery or procedure. The use of a medical device. Contact with the bacteria may occur: During a stay in a hospital, rehabilitation facility, nursing home, or other health care facility (health care-associated MRSA). In daily activities where there is close contact with others, such as sports, child care centers, or at home (community-associated MRSA). What increases the risk? You are more likely to develop this condition if you: Have a surgery or procedure. Have an IV or a thin tube (catheter) placed in your body. Are elderly. Are on kidney dialysis. Have recently taken an antibiotic medicine. Live in a long-term care facility. Have a chronic wound or skin ulcer. Have a weak body defense system (immune system). Play sports that involve skin-to-skin contact. Live in a crowded place, like a dormitory or [employer] Share towels, razors, or sports equipment with other people. Have a history of MRSA infection or colonization. What are the signs or symptoms? Symptoms of this condition depend on the area that  is affected. Symptoms may include: A pus-filled pimple or boil. Pus that drains from your skin. A sore (abscess) under your skin or somewhere in your body. Fever with or without chills. Difficulty breathing. Coughing up blood. Redness, warmth, swelling, or pain in the affected area. How is this diagnosed? This condition may be diagnosed based on: A physical exam. Your medical history. Taking a sample from the infected area and growing it in a lab (culture). You may also have other tests, including: Imaging tests, such as X-rays, a CT scan, or an MRI. Lab tests, such as blood, urine, or phlegm (sputum) tests. You skin or nose may be swabbed when you are admitted to a health care facility for a procedure. This is to screen for MRSA. How is this treated? Treatment depends on the type of MRSA infection you have and how severe, deep, or extensive it is. Treatment may include: Antibiotic medicines. Surgery to drain pus from the infected area. Severe infections may require a hospital stay. Follow these instructions at home: Medicines Take over-the-counter and prescription medicines only as told by your health care provider. If you were prescribed an antibiotic medicine, use it as told by your health care provider. Do not stop using the antibiotic even if you start to feel better. Prevention Follow these instructions to avoid spreading the infection to others: Wash your hands frequently with soap and water. If soap and water are not available, use an alcohol-based hand sanitizer. Avoid close contact with those around you as much as possible. Do not use towels, razors, toothbrushes, bedding, or other items that will be used by others. Wash towels, bedding, and clothes in the washing machine with detergent  and hot water. Dry them in a hot dryer. Clean surfaces regularly to remove germs (disinfection). Use products or solutions that contain bleach. Make sure you disinfect bathroom surfaces, food  preparation areas, exercise equipment, and doorknobs.  General instructions If you have a wound, follow instructions from your health care provider about how to take care of your wound. Do not pick at scabs. Do not try to drain any infection sites or pimples. Tell all your health care providers that you have MRSA, or if you have ever had a MRSA infection. Keep all follow-up visits as told by your health care provider. This is important. Contact a health care provider if you: Do not get better. Have symptoms that get worse. Have new symptoms. Get help right away if you have: Nausea or vomiting, or if you cannot take medicine without vomiting. Trouble breathing. Chest pain. These symptoms may represent a serious problem that is an emergency. Do not wait to see if the symptoms will go away. Get medical help right away. Call your local emergency services (911 in the [place] Do not drive yourself to the hospital. Summary MRSA infection is caused by bacteria called Staphylococcus aureus, or staph, that no longer respond to common antibiotic medicines. Treatment for this condition depends on the type of MRSA infection you have and how severe, deep, and extensive it is. If you were prescribed an antibiotic medicine, use it as told by your health care provider. Do not stop using the antibiotic even if you start to feel better. Follow instructions from your health care provider to avoid spreading the infection to others. This information is not intended to replace advice given to you by your health care provider. Make sure you discuss any questions you have with your health care provider. Document Revised: 05/02/2022 Document Reviewed: 05/02/2022 Elsevier Patient Education  [employer].

## 2022-09-19 NOTE — Progress Notes (Signed)
Subjective:    Patient ID: Alexandria Irwin, female    DOB: [birth year], 77 y.o.   MRN: [id number]  Chief Complaint  Patient presents with   Cellulitis   Pt presents to the office today with a rash on lower abdomen that has been there on and off for a month. She was taking Keflex and diflucan that helped. She reports the area starts as a pimple then will blister up and spread. She has them scattered on her lower abdomen. Reports intermittent burning pain of 4 out 10.   Report bilateral erythemas of lower legs last week.  Rash This is a new problem. The current episode started more than 1 month ago. The problem has been waxing and waning since onset. The affected locations include the abdomen. The rash is characterized by burning. Past treatments include antibiotic cream. The treatment provided mild relief.      Review of Systems  Skin:  Positive for rash.  All other systems reviewed and are negative.      Objective:   Physical Exam Vitals reviewed.  Constitutional:      General: She is not in acute distress.    Appearance: She is well-developed.  HENT:     Head: Normocephalic and atraumatic.  Eyes:     Pupils: Pupils are equal, round, and reactive to light.  Neck:     Thyroid: No thyromegaly.  Cardiovascular:     Rate and Rhythm: Normal rate and regular rhythm.     Heart sounds: Normal heart sounds. No murmur heard. Pulmonary:     Effort: Pulmonary effort is normal. No respiratory distress.     Breath sounds: Normal breath sounds. No wheezing.  Abdominal:     General: Bowel sounds are normal. There is no distension.     Palpations: Abdomen is soft.     Tenderness: There is no abdominal tenderness.  Musculoskeletal:        General: No tenderness. Normal range of motion.     Cervical back: Normal range of motion and neck supple.  Skin:    General: Skin is warm and dry.          Comments: Ulcer left 1X0.8 cm and right 1X1 cm   Neurological:     Mental Status: She is  alert and oriented to person, place, and time.     Cranial Nerves: No cranial nerve deficit.     Deep Tendon Reflexes: Reflexes are normal and symmetric.  Psychiatric:        Behavior: Behavior normal.        Thought Content: Thought content normal.        Judgment: Judgment normal.      BP 118/73   Pulse 72   Temp (!) 97 F (36.1 C) (Temporal)   Ht 5' (1.524 m)   Wt 154 lb 12.8 oz (70.2 kg)   SpO2 98%   BMI 30.23 kg/m       Assessment & Plan:  Alexandria Irwin comes in today with chief complaint of Cellulitis   Diagnosis and orders addressed:  1. Pustular lesion Wound culture pending  Start doxycycline  Keep clean and dry - doxycycline (VIBRA-TABS) 100 MG tablet; Take 1 tablet (100 mg total) by mouth 2 (two) times daily.  Dispense: 20 tablet; Refill: 0  2. Cellulitis of lower extremity, unspecified laterality Keep elevated Report increased redness or fever - doxycycline (VIBRA-TABS) 100 MG tablet; Take 1 tablet (100 mg total) by mouth 2 (two) times daily.  Dispense: 20 tablet; Refill: 0  RTO in 2 weeks to recheck  Evelina Dun, FNP

## 2022-09-24 LAB — WOUND CULTURE

## 2022-09-30 ENCOUNTER — Ambulatory Visit (INDEPENDENT_AMBULATORY_CARE_PROVIDER_SITE_OTHER): Payer: Medicare Other | Admitting: Family

## 2022-09-30 ENCOUNTER — Encounter: Payer: Self-pay | Admitting: Family

## 2022-09-30 VITALS — BP 126/80 | HR 76 | Temp 97.8°F | Ht 60.0 in | Wt 153.4 lb

## 2022-09-30 DIAGNOSIS — L03119 Cellulitis of unspecified part of limb: Secondary | ICD-10-CM | POA: Diagnosis not present

## 2022-09-30 DIAGNOSIS — L089 Local infection of the skin and subcutaneous tissue, unspecified: Secondary | ICD-10-CM

## 2022-09-30 MED ORDER — CLINDAMYCIN HCL 300 MG PO CAPS: 300.0000 mg | ORAL_CAPSULE | Freq: Three times a day (TID) | ORAL | 0 refills | Status: DC

## 2022-09-30 NOTE — Patient Instructions (Signed)

## 2022-09-30 NOTE — Progress Notes (Signed)
   Subjective:    Patient ID: Alexandria Irwin, female    DOB: [birth year], 77 y.o.   MRN: [id number]  Chief Complaint  Patient presents with   Cellulitis    HPI PT presents to the office today to recheck cellulitis in bilateral legs and pustular lesion. We did a wound culture that was positive for Methicillin - resistant Staphylococcus aureus  and citrobacter freundii.  Doxycyline was susceptible. She completed this and her lesion on her abdomen are healed.   Her legs are still erythemas with mild edema. Reports mild aching pain of 5-6 out 10.     Review of Systems  All other systems reviewed and are negative.      Objective:   Physical Exam Vitals reviewed.  Constitutional:      General: She is not in acute distress.    Appearance: She is well-developed.  HENT:     Head: Normocephalic and atraumatic.  Eyes:     Pupils: Pupils are equal, round, and reactive to light.  Neck:     Thyroid: No thyromegaly.  Cardiovascular:     Rate and Rhythm: Normal rate and regular rhythm.     Heart sounds: Normal heart sounds. No murmur heard. Pulmonary:     Effort: Pulmonary effort is normal. No respiratory distress.     Breath sounds: Normal breath sounds. No wheezing.  Abdominal:     General: Bowel sounds are normal. There is no distension.     Palpations: Abdomen is soft.     Tenderness: There is no abdominal tenderness.  Musculoskeletal:        General: No tenderness. Normal range of motion.     Cervical back: Normal range of motion and neck supple.     Right lower leg: Edema (2+) present.     Left lower leg: Edema (2+) present.     Comments: Erythemas and mildly warm  Skin:    General: Skin is warm and dry.  Neurological:     Mental Status: She is alert and oriented to person, place, and time.     Cranial Nerves: No cranial nerve deficit.     Deep Tendon Reflexes: Reflexes are normal and symmetric.  Psychiatric:        Behavior: Behavior normal.        Thought Content: Thought  content normal.        Judgment: Judgment normal.      BP 126/80   Pulse 76   Temp 97.8 F (36.6 C) (Temporal)   Ht 5' (1.524 m)   Wt 153 lb 6.4 oz (69.6 kg)   SpO2 97%   BMI 29.96 kg/m       Assessment & Plan:  ANEISHA LOVERN comes in today with chief complaint of Cellulitis   Diagnosis and orders addressed:  1. Cellulitis of lower extremity, unspecified laterality Keep elevated Compression hose daily Start clindamycin  Follow up if symptoms worsen or do not improve  - Compression stockings - clindamycin (CLEOCIN) 300 MG capsule; Take 1 capsule (300 mg total) by mouth 3 (three) times daily.  Dispense: 30 capsule; Refill: 0  2. Pustular lesion Greatly improved    Evelina Dun, FNP

## 2022-10-02 ENCOUNTER — Other Ambulatory Visit: Payer: Self-pay | Admitting: Family

## 2022-10-03 DIAGNOSIS — E1142 Type 2 diabetes mellitus with diabetic polyneuropathy: Secondary | ICD-10-CM | POA: Diagnosis not present

## 2022-10-03 DIAGNOSIS — B351 Tinea unguium: Secondary | ICD-10-CM | POA: Diagnosis not present

## 2022-10-03 DIAGNOSIS — M79676 Pain in unspecified toe(s): Secondary | ICD-10-CM | POA: Diagnosis not present

## 2022-10-03 DIAGNOSIS — L84 Corns and callosities: Secondary | ICD-10-CM | POA: Diagnosis not present

## 2022-10-11 MED ORDER — FLUCONAZOLE 150 MG PO TABS: 150.0000 mg | ORAL_TABLET | ORAL | 0 refills | Status: DC | PRN

## 2022-10-17 DIAGNOSIS — E119 Type 2 diabetes mellitus without complications: Secondary | ICD-10-CM

## 2022-10-18 ENCOUNTER — Other Ambulatory Visit: Payer: Self-pay | Admitting: Family

## 2022-10-18 MED ORDER — ONETOUCH DELICA PLUS LANCET33G MISC: 1.0000 | Freq: Every day | 8 refills | Status: DC

## 2022-11-07 ENCOUNTER — Ambulatory Visit (INDEPENDENT_AMBULATORY_CARE_PROVIDER_SITE_OTHER): Payer: Medicare Other | Admitting: Family

## 2022-11-07 ENCOUNTER — Encounter: Payer: Self-pay | Admitting: Family

## 2022-11-07 ENCOUNTER — Other Ambulatory Visit: Payer: Self-pay | Admitting: Family

## 2022-11-07 VITALS — BP 120/75 | HR 63 | Temp 97.3°F | Ht 60.0 in | Wt 151.0 lb

## 2022-11-07 DIAGNOSIS — E039 Hypothyroidism, unspecified: Secondary | ICD-10-CM | POA: Diagnosis not present

## 2022-11-07 DIAGNOSIS — E1169 Type 2 diabetes mellitus with other specified complication: Secondary | ICD-10-CM

## 2022-11-07 DIAGNOSIS — I152 Hypertension secondary to endocrine disorders: Secondary | ICD-10-CM | POA: Diagnosis not present

## 2022-11-07 DIAGNOSIS — T451X5A Adverse effect of antineoplastic and immunosuppressive drugs, initial encounter: Secondary | ICD-10-CM

## 2022-11-07 DIAGNOSIS — G620 Drug-induced polyneuropathy: Secondary | ICD-10-CM

## 2022-11-07 DIAGNOSIS — E1159 Type 2 diabetes mellitus with other circulatory complications: Secondary | ICD-10-CM

## 2022-11-07 DIAGNOSIS — E559 Vitamin D deficiency, unspecified: Secondary | ICD-10-CM | POA: Diagnosis not present

## 2022-11-07 DIAGNOSIS — E663 Overweight: Secondary | ICD-10-CM

## 2022-11-07 DIAGNOSIS — Z7984 Long term (current) use of oral hypoglycemic drugs: Secondary | ICD-10-CM | POA: Diagnosis not present

## 2022-11-07 DIAGNOSIS — K7460 Unspecified cirrhosis of liver: Secondary | ICD-10-CM

## 2022-11-07 DIAGNOSIS — C541 Malignant neoplasm of endometrium: Secondary | ICD-10-CM | POA: Diagnosis not present

## 2022-11-07 DIAGNOSIS — E785 Hyperlipidemia, unspecified: Secondary | ICD-10-CM | POA: Diagnosis not present

## 2022-11-07 LAB — BAYER DCA HB A1C WAIVED: HB A1C (BAYER DCA - WAIVED): 6.9 % — ABNORMAL HIGH (ref 4.8–5.6)

## 2022-11-07 NOTE — Patient Instructions (Signed)
Peripheral Edema  Peripheral edema is swelling that is caused by a buildup of fluid. Peripheral edema most often affects the lower legs, ankles, and feet. It can also develop in the arms, hands, and face. The area of the body that has peripheral edema will look swollen. It may also feel heavy or warm. Your clothes may start to feel tight. Pressing on the area may make a temporary dent in your skin (pitting edema). You may not be able to move your swollen arm or leg as much as usual. There are many causes of peripheral edema. It can happen because of a complication of other conditions such as heart failure, kidney disease, or a problem with your circulation. It also can be a side effect of certain medicines or happen because of an infection. It often happens to women during pregnancy. Sometimes, the cause is not known. Follow these instructions at home: Managing pain, stiffness, and swelling  Raise (elevate) your legs while you are sitting or lying down. Move around often to prevent stiffness and to reduce swelling. Do not sit or stand for long periods of time. Do not wear tight clothing. Do not wear garters on your upper legs. Exercise your legs to get your circulation going. This helps to move the fluid back into your blood vessels, and it may help the swelling go down. Wear compression stockings as told by your health care provider. These stockings help to prevent blood clots and reduce swelling in your legs. It is important that these are the correct size. These stockings should be prescribed by your doctor to prevent possible injuries. If elastic bandages or wraps are recommended, use them as told by your health care provider. Medicines Take over-the-counter and prescription medicines only as told by your health care provider. Your health care provider may prescribe medicine to help your body get rid of excess water (diuretic). Take this medicine if you are told to take it. General  instructions Eat a low-salt (low-sodium) diet as told by your health care provider. Sometimes, eating less salt may reduce swelling. Pay attention to any changes in your symptoms. Moisturize your skin daily to help prevent skin from cracking and draining. Keep all follow-up visits. This is important. Contact a health care provider if: You have a fever. You have swelling in only one leg. You have increased swelling, redness, or pain in one or both of your legs. You have drainage or sores at the area where you have edema. Get help right away if: You have edema that starts suddenly or is getting worse, especially if you are pregnant or have a medical condition. You develop shortness of breath, especially when you are lying down. You have pain in your chest or abdomen. You feel weak. You feel like you will faint. These symptoms may be an emergency. Get help right away. Call 911. Do not wait to see if the symptoms will go away. Do not drive yourself to the hospital. Summary Peripheral edema is swelling that is caused by a buildup of fluid. Peripheral edema most often affects the lower legs, ankles, and feet. Move around often to prevent stiffness and to reduce swelling. Do not sit or stand for long periods of time. Pay attention to any changes in your symptoms. Contact a health care provider if you have edema that starts suddenly or is getting worse, especially if you are pregnant or have a medical condition. Get help right away if you develop shortness of breath, especially when lying down.   This information is not intended to replace advice given to you by your health care provider. Make sure you discuss any questions you have with your health care provider. Document Revised: 02/19/2021 Document Reviewed: 02/19/2021 Elsevier Patient Education  2023 [employer]  

## 2022-11-07 NOTE — Progress Notes (Signed)
Subjective:    Patient ID: Alexandria Irwin, female    DOB: [birth year], 77 y.o.   MRN: [id number]  Chief Complaint  Patient presents with   Medical Management of Chronic Issues    Pt presents to the office today for chronic follow up. 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 She has completed chemo and radiation. 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    She is followed by Dermatologists for basal skin cancer.  Hypertension This is a chronic problem. The current episode started more than 1 year ago. The problem has been resolved since onset. The problem is controlled. Associated symptoms include blurred vision and peripheral edema. Pertinent negatives include no shortness of breath. Risk factors for coronary artery disease include dyslipidemia, diabetes mellitus and sedentary lifestyle. The current treatment provides moderate improvement. Identifiable causes of hypertension include a thyroid problem.  Thyroid Problem Presents for follow-up visit. Symptoms include fatigue. Patient reports no anxiety or dry skin. The symptoms have been stable. Her past medical history is significant for hyperlipidemia.  Hyperlipidemia This is a chronic problem. The current episode started more than 1 year ago. The problem is controlled. Recent lipid tests were reviewed and are normal. Pertinent negatives include no shortness of breath. Current antihyperlipidemic treatment includes statins. The current treatment provides moderate improvement of lipids.  Diabetes She presents for her follow-up diabetic visit. She has type 2 diabetes mellitus. Pertinent negatives for hypoglycemia include no nervousness/anxiousness. Associated symptoms include blurred vision, fatigue and foot paresthesias. Symptoms are stable. Diabetic complications include peripheral neuropathy. Risk factors for coronary artery disease include dyslipidemia,  diabetes mellitus, hypertension, sedentary lifestyle and post-menopausal. She is following a generally healthy diet. Her overall blood glucose range is 130-140 mg/dl. Eye exam is current.      Review of Systems  Constitutional:  Positive for fatigue.  Eyes:  Positive for blurred vision.  Respiratory:  Negative for shortness of breath.   Psychiatric/Behavioral:  The patient is not nervous/anxious.   All other systems reviewed and are negative.      Objective:   Physical Exam Vitals reviewed.  Constitutional:      General: She is not in acute distress.    Appearance: She is well-developed.  HENT:     Head: Normocephalic and atraumatic.     Right Ear: Tympanic membrane normal.     Left Ear: Tympanic membrane normal.  Eyes:     Pupils: Pupils are equal, round, and reactive to light.  Neck:     Thyroid: No thyromegaly.  Cardiovascular:     Rate and Rhythm: Normal rate and regular rhythm.     Heart sounds: Normal heart sounds. No murmur heard. Pulmonary:     Effort: Pulmonary effort is normal. No respiratory distress.     Breath sounds: Normal breath sounds. No wheezing.  Abdominal:     General: Bowel sounds are normal. There is no distension.     Palpations: Abdomen is soft.     Tenderness: There is no abdominal tenderness.  Musculoskeletal:        General: No tenderness. Normal range of motion.     Cervical back: Normal range of motion and neck supple.  Skin:    General: Skin is warm and dry.  Neurological:     Mental Status: She is alert and oriented to person, place, and time.     Cranial Nerves: No cranial nerve deficit.  Deep Tendon Reflexes: Reflexes are normal and symmetric.  Psychiatric:        Behavior: Behavior normal.        Thought Content: Thought content normal.        Judgment: Judgment normal.       BP 120/75   Pulse 63   Temp (!) 97.3 F (36.3 C) (Temporal)   Ht 5' (1.524 m)   Wt 151 lb (68.5 kg)   SpO2 98%   BMI 29.49 kg/m       Assessment & Plan:  JIMI TEETS comes in today with chief complaint of Medical Management of Chronic Issues   Diagnosis and orders addressed:  1. Type 2 diabetes mellitus with other specified complication, without long-term current use of insulin (HCC) - Bayer DCA Hb A1c Waived - CMP14+EGFR - Microalbumin / creatinine urine ratio  2. Endometrial adenocarcinoma (HCC) - CMP14+EGFR  3. Overweight (BMI 25.0-29.9) - CMP14+EGFR  4. Vitamin D deficiency  - CMP14+EGFR - VITAMIN D 25 Hydroxy (Vit-D Deficiency, Fractures)  5. Peripheral neuropathy due to chemotherapy (HCC) - CMP14+EGFR  6. Hypothyroidism, unspecified type - CMP14+EGFR - TSH  7. Hypertension associated with diabetes (HCC)  - CMP14+EGFR  8. Hyperlipidemia associated with type 2 diabetes mellitus (HCC) - CMP14+EGFR  9. Cirrhosis of liver without ascites, unspecified hepatic cirrhosis type (HCC) - CMP14+EGFR   Labs pending Continue current medications  Health Maintenance reviewed Diet and exercise encouraged  Follow up plan: 4 months   Jannifer Rodney, FNP

## 2022-11-07 NOTE — Telephone Encounter (Signed)
Pt seen on 11/07/22

## 2022-11-07 NOTE — Telephone Encounter (Signed)
[facility] NTBS in July for 6 mos FU refill sent to pharmacy

## 2022-11-08 ENCOUNTER — Other Ambulatory Visit: Payer: Self-pay | Admitting: Family

## 2022-11-08 LAB — CMP14+EGFR
ALT: 25 IU/L (ref 0–32)
AST: 27 IU/L (ref 0–40)
Albumin/Globulin Ratio: 2 (ref 1.2–2.2)
Albumin: 4.6 g/dL (ref 3.8–4.8)
Alkaline Phosphatase: 40 IU/L — ABNORMAL LOW (ref 44–121)
BUN/Creatinine Ratio: 14 (ref 12–28)
BUN: 15 mg/dL (ref 8–27)
Bilirubin Total: 1.2 mg/dL (ref 0.0–1.2)
CO2: 22 mmol/L (ref 20–29)
Calcium: 10 mg/dL (ref 8.7–10.3)
Chloride: 99 mmol/L (ref 96–106)
Creatinine, Ser: 1.09 mg/dL — ABNORMAL HIGH (ref 0.57–1.00)
Globulin, Total: 2.3 g/dL (ref 1.5–4.5)
Glucose: 143 mg/dL — ABNORMAL HIGH (ref 70–99)
Potassium: 4.5 mmol/L (ref 3.5–5.2)
Sodium: 140 mmol/L (ref 134–144)
Total Protein: 6.9 g/dL (ref 6.0–8.5)
eGFR: 52 mL/min/{1.73_m2} — ABNORMAL LOW (ref >59)

## 2022-11-08 LAB — TSH: TSH: 0.264 u[IU]/mL — ABNORMAL LOW (ref 0.450–4.500)

## 2022-11-08 LAB — MICROALBUMIN / CREATININE URINE RATIO
Creatinine, Urine: 98.2 mg/dL
Microalb/Creat Ratio: 15 mg/g creat (ref 0–29)
Microalbumin, Urine: 14.5 ug/mL

## 2022-11-08 LAB — VITAMIN D 25 HYDROXY (VIT D DEFICIENCY, FRACTURES): Vit D, 25-Hydroxy: 49.3 ng/mL (ref 30.0–100.0)

## 2022-11-08 MED ORDER — LEVOTHYROXINE SODIUM 75 MCG PO TABS: 75.0000 ug | ORAL_TABLET | Freq: Every day | ORAL | 11 refills | Status: DC

## 2022-11-10 NOTE — Progress Notes (Signed)
Radiation Oncology         [phone] ________________________________  Name: Alexandria Irwin MRN: [id number]  Date: 11/11/2022  DOB: 28-Apr-1946  Follow-Up Visit Note  CC: Junie Spencer, FNP  Adolphus Birchwood, MD  No diagnosis found.  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 She was last seen here for follow-up on 11/12/21. Since her last visit, the patient followed up with Dr. Pricilla Holm on 05/18/23. During which time, the patient denied any symptoms concerning for disease recurrence and she was noted as NED on examination.                 Of note: Last month, the patient had recurrent cellulitis of her left LE. Her PCP started her on clindamycin for this on 09/30/22. She was also instructed to wear compression hose daily. Additionally, the patient has been dealing with recurrent yeast infections. Diflucan seems to help manage this well for her (last refilled by her PCP on 10/17/22).   ***  Allergies:  is allergic to neomycin-bacitracin zn-polymyx, ace inhibitors, ciprocin-fluocin-procin [fluocinolone], norvasc [amlodipine], latex, and sulfa antibiotics.  Meds: Current Outpatient Medications  Medication Sig Dispense Refill   levothyroxine (SYNTHROID) 75 MCG tablet Take 1 tablet (75 mcg total) by mouth daily. 30 tablet 11   amLODipine (NORVASC) 5 MG tablet Take 1 tablet (5 mg total) by mouth daily. 90 tablet 0   Ascorbic Acid (VITAMIN C) 100 MG CHEW Chew 100 mg by mouth daily.     aspirin EC 81 MG tablet Take 81 mg by mouth daily.     blood glucose meter kit and supplies Dispense based on patient and insurance preference. Use up to four times daily as directed. (FOR ICD-10 E10.9, E11.9). 1 each  0   Cholecalciferol 50 MCG (2000 UT) TABS      clindamycin (CLEOCIN) 300 MG capsule Take 1 capsule (300 mg total) by mouth 3 (three) times daily. 30 capsule 0   clobetasol cream (TEMOVATE) 0.05 % Apply 1 Application topically 2 (two) times daily. 60 g 2   cycloSPORINE (RESTASIS) 0.05 % ophthalmic emulsion Place 1 drop into both eyes 2 (two) times daily.     dapagliflozin propanediol (FARXIGA) 10 MG TABS tablet Take 1 tablet (10 mg total) by mouth daily before breakfast. 90 tablet 11   doxycycline (VIBRA-TABS) 100 MG tablet Take 1 tablet (100 mg total) by mouth 2 (two) times daily. 20 tablet 0   fluconazole (DIFLUCAN) 150 MG tablet Take 1 tablet (150 mg total) by mouth every three (3) days as needed. 3 tablet 0   furosemide (LASIX) 20 MG tablet TAKE 1/2 TABLET BY MOUTH DAILY 45 tablet 1   glimepiride (AMARYL) 4 MG tablet TAKE 1 TABLET BY MOUTH EVERY DAY WITH BREAKFAST 90 tablet 0   Lancets (ONETOUCH DELICA PLUS LANCET33G) MISC 1 each by Other route daily. Dx E11.9 100 each 8   losartan (COZAAR) 100 MG tablet Take 1 tablet by mouth daily.     meclizine (ANTIVERT) 25 MG tablet TAKE 1 TABLET (25 MG TOTAL) BY MOUTH 2 (TWO) TIMES DAILY AS NEEDED FOR DIZZINESS. 60 tablet 2   metFORMIN (GLUCOPHAGE-XR) 500 MG 24 hr tablet TAKE 2 TABLETS BY MOUTH EVERY  DAY WITH BREAKFAST 180 tablet 0   metoprolol succinate (TOPROL-XL) 50 MG 24 hr tablet TAKE 1 TABLET (50 MG) BY MOUTH IN THE MORNING AND AT BEDTIME 180 tablet 1   ONETOUCH VERIO test strip USE TO TEST BLOOD SUGAR DAILY DX E11.9 100 strip 3   simvastatin (ZOCOR) 40 MG tablet TAKE 1 TABLET BY MOUTH DAILY AT 6 PM. 90 tablet 0   No current facility-administered medications for this encounter.    Physical Findings: The patient is in no acute distress. Patient is alert and oriented.  vitals were not taken for this visit. .  No significant changes. Lungs are clear to auscultation bilaterally. Heart has regular rate and rhythm. No palpable cervical,  supraclavicular, or axillary adenopathy. Abdomen soft, non-tender, normal bowel sounds.  On pelvic examination the external genitalia were unremarkable. A speculum exam was performed. There are no mucosal lesions noted in the vaginal vault. A Pap smear was obtained of the proximal vagina. On bimanual and rectovaginal examination there were no pelvic masses appreciated. ***   Lab Findings: Lab Results  Component Value Date   WBC 5.7 05/10/2022   HGB 14.6 05/10/2022   HCT 42.9 05/10/2022   MCV 97 05/10/2022   PLT 195 05/10/2022    Radiographic Findings: No results found.  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  ***  Plan:  ***   *** minutes of total time was spent for this patient encounter, including preparation, face-to-face counseling with the patient and coordination of care, physical exam, and documentation of the encounter. ____________________________________  Billie Lade, PhD, MD  This document serves as a record of services personally performed by Antony Blackbird, MD. It was created on his behalf by Neena Rhymes, a trained medical scribe. The creation of this record is based on the scribe's personal observations and the provider's statements to them. This document has been checked and approved by the attending provider.

## 2022-11-11 ENCOUNTER — Ambulatory Visit
Admission: RE | Admit: 2022-11-11 | Discharge: 2022-11-11 | Disposition: A | Discharge status: Home / Self Care | Payer: Medicare Other | Source: Ambulatory Visit | Attending: Radiation Oncology | Admitting: Radiation Oncology

## 2022-11-11 ENCOUNTER — Encounter: Payer: Self-pay | Admitting: Radiation Oncology

## 2022-11-11 VITALS — BP 117/64 | HR 67 | Temp 97.7°F | Resp 18 | Wt 152.2 lb

## 2022-11-11 DIAGNOSIS — C541 Malignant neoplasm of endometrium: Secondary | ICD-10-CM | POA: Diagnosis not present

## 2022-11-11 DIAGNOSIS — Z79890 Hormone replacement therapy (postmenopausal): Secondary | ICD-10-CM | POA: Insufficient documentation

## 2022-11-11 DIAGNOSIS — Z8542 Personal history of malignant neoplasm of other parts of uterus: Secondary | ICD-10-CM | POA: Insufficient documentation

## 2022-11-11 DIAGNOSIS — Z923 Personal history of irradiation: Secondary | ICD-10-CM | POA: Diagnosis not present

## 2022-11-11 DIAGNOSIS — Z7982 Long term (current) use of aspirin: Secondary | ICD-10-CM | POA: Insufficient documentation

## 2022-11-11 DIAGNOSIS — Z79899 Other long term (current) drug therapy: Secondary | ICD-10-CM | POA: Diagnosis not present

## 2022-11-11 DIAGNOSIS — Z7984 Long term (current) use of oral hypoglycemic drugs: Secondary | ICD-10-CM | POA: Insufficient documentation

## 2022-11-11 NOTE — Progress Notes (Signed)
Alexandria Irwin is here today for follow up post radiation to the pelvic.  They completed their radiation on: 10/13/17   Does the patient complain of any of the following:  Pain:Denies Abdominal bloating:  Denies Diarrhea/Constipation: Denies Nausea/Vomiting: Denies Vaginal Discharge: Denies Blood in Urine or Stool: Denies  Urinary Issues (dysuria/incomplete emptying/ incontinence/ increased frequency/urgency): Some frequency Does patient report using vaginal dilator 2-3 times a week and/or sexually active 2-3 weeks: States that she is not using her dilators Post radiation skin changes: Denies   Additional comments if applicable:  Vitals:   11/11/22 1058  BP: 117/64  Pulse: 67  Resp: 18  Temp: 97.7 F (36.5 C)  SpO2: 97%  Weight: 69 kg

## 2022-11-27 DIAGNOSIS — L570 Actinic keratosis: Secondary | ICD-10-CM | POA: Diagnosis not present

## 2022-11-27 DIAGNOSIS — Z08 Encounter for follow-up examination after completed treatment for malignant neoplasm: Secondary | ICD-10-CM | POA: Diagnosis not present

## 2022-11-27 DIAGNOSIS — X32XXXD Exposure to sunlight, subsequent encounter: Secondary | ICD-10-CM | POA: Diagnosis not present

## 2022-11-27 DIAGNOSIS — Z85828 Personal history of other malignant neoplasm of skin: Secondary | ICD-10-CM | POA: Diagnosis not present

## 2022-11-28 ENCOUNTER — Other Ambulatory Visit: Payer: Self-pay | Admitting: Family

## 2022-11-28 DIAGNOSIS — E1169 Type 2 diabetes mellitus with other specified complication: Secondary | ICD-10-CM

## 2022-12-05 ENCOUNTER — Other Ambulatory Visit: Payer: Self-pay | Admitting: Family

## 2022-12-05 DIAGNOSIS — R42 Dizziness and giddiness: Secondary | ICD-10-CM

## 2022-12-06 ENCOUNTER — Other Ambulatory Visit: Payer: Self-pay | Admitting: Family

## 2022-12-06 DIAGNOSIS — E559 Vitamin D deficiency, unspecified: Secondary | ICD-10-CM

## 2022-12-06 DIAGNOSIS — E1159 Type 2 diabetes mellitus with other circulatory complications: Secondary | ICD-10-CM

## 2022-12-06 DIAGNOSIS — R42 Dizziness and giddiness: Secondary | ICD-10-CM

## 2022-12-12 DIAGNOSIS — B351 Tinea unguium: Secondary | ICD-10-CM | POA: Diagnosis not present

## 2022-12-12 DIAGNOSIS — M79676 Pain in unspecified toe(s): Secondary | ICD-10-CM | POA: Diagnosis not present

## 2022-12-12 DIAGNOSIS — L84 Corns and callosities: Secondary | ICD-10-CM | POA: Diagnosis not present

## 2022-12-12 DIAGNOSIS — E1142 Type 2 diabetes mellitus with diabetic polyneuropathy: Secondary | ICD-10-CM | POA: Diagnosis not present

## 2022-12-26 DIAGNOSIS — H04123 Dry eye syndrome of bilateral lacrimal glands: Secondary | ICD-10-CM | POA: Diagnosis not present

## 2022-12-26 DIAGNOSIS — C441191 Basal cell carcinoma of skin of left upper eyelid, including canthus: Secondary | ICD-10-CM | POA: Diagnosis not present

## 2022-12-26 DIAGNOSIS — H0279 Other degenerative disorders of eyelid and periocular area: Secondary | ICD-10-CM | POA: Diagnosis not present

## 2022-12-26 DIAGNOSIS — H02234 Paralytic lagophthalmos left upper eyelid: Secondary | ICD-10-CM | POA: Diagnosis not present

## 2022-12-26 DIAGNOSIS — Z09 Encounter for follow-up examination after completed treatment for conditions other than malignant neoplasm: Secondary | ICD-10-CM | POA: Diagnosis not present

## 2022-12-26 DIAGNOSIS — H02423 Myogenic ptosis of bilateral eyelids: Secondary | ICD-10-CM | POA: Diagnosis not present

## 2022-12-28 ENCOUNTER — Other Ambulatory Visit: Payer: Self-pay | Admitting: Family

## 2023-01-13 ENCOUNTER — Encounter: Payer: Self-pay | Admitting: Family

## 2023-01-13 ENCOUNTER — Ambulatory Visit (INDEPENDENT_AMBULATORY_CARE_PROVIDER_SITE_OTHER): Payer: Medicare Other | Admitting: Family

## 2023-01-13 VITALS — BP 128/65 | HR 61 | Temp 98.0°F | Ht 60.0 in | Wt 153.6 lb

## 2023-01-13 DIAGNOSIS — G4700 Insomnia, unspecified: Secondary | ICD-10-CM

## 2023-01-13 DIAGNOSIS — E039 Hypothyroidism, unspecified: Secondary | ICD-10-CM | POA: Diagnosis not present

## 2023-01-13 NOTE — Progress Notes (Signed)
   Subjective:    Patient ID: Alexandria Irwin, female    DOB: 10-27-1945, 77 y.o.   MRN: [id number]  Chief Complaint  Patient presents with   Hypothyroidism   Insomnia   Pt presents to the office today to recheck thyroid. She was seen on 11/07/22 and we decreased his levothyroxine to 75 mcg from 88 mcg. States she is doing well. States she feels fatigued, but also having insomnia and sleeping well.  Insomnia Primary symptoms: fragmented sleep, no difficulty falling asleep, frequent awakening.   The current episode started more than one year. The onset quality is gradual. The problem occurs intermittently.  Thyroid Problem Presents for follow-up visit. Symptoms include fatigue. Patient reports no anxiety, constipation or diarrhea. The symptoms have been stable.      Review of Systems  Constitutional:  Positive for fatigue.  Gastrointestinal:  Negative for constipation and diarrhea.  Psychiatric/Behavioral:  The patient has insomnia. The patient is not nervous/anxious.   All other systems reviewed and are negative.      Objective:   Physical Exam Vitals reviewed.  Constitutional:      General: She is not in acute distress.    Appearance: She is well-developed.  HENT:     Head: Normocephalic and atraumatic.     Right Ear: Tympanic membrane normal.     Left Ear: Tympanic membrane normal.  Eyes:     Pupils: Pupils are equal, round, and reactive to light.  Neck:     Thyroid: No thyromegaly.  Cardiovascular:     Rate and Rhythm: Normal rate and regular rhythm.     Heart sounds: Normal heart sounds. No murmur heard. Pulmonary:     Effort: Pulmonary effort is normal. No respiratory distress.     Breath sounds: Normal breath sounds. No wheezing.  Abdominal:     General: Bowel sounds are normal. There is no distension.     Palpations: Abdomen is soft.     Tenderness: There is no abdominal tenderness.  Musculoskeletal:        General: No tenderness. Normal range of motion.      Cervical back: Normal range of motion and neck supple.  Skin:    General: Skin is warm and dry.  Neurological:     Mental Status: She is alert and oriented to person, place, and time.     Cranial Nerves: No cranial nerve deficit.     Deep Tendon Reflexes: Reflexes are normal and symmetric.  Psychiatric:        Behavior: Behavior normal.        Thought Content: Thought content normal.        Judgment: Judgment normal.     BP 128/65   Pulse 61   Temp 98 F (36.7 C) (Temporal)   Ht 5' (1.524 m)   Wt 153 lb 9.6 oz (69.7 kg)   SpO2 96%   BMI 30.00 kg/m        Assessment & Plan:  Alexandria Irwin comes in today with chief complaint of Hypothyroidism and Insomnia   Diagnosis and orders addressed:  1. Hypothyroidism, unspecified type - TSH  2. Insomnia, unspecified type - TSH   Labs pending Continue current medications  Health Maintenance reviewed Diet and exercise encouraged  Follow up plan: 2 months for chronic follow up.    Jannifer Rodney, FNP

## 2023-01-13 NOTE — Patient Instructions (Signed)
Insomnia Insomnia is a sleep disorder that makes it difficult to fall asleep or stay asleep. Insomnia can cause fatigue, low energy, difficulty concentrating, mood swings, and poor performance at work or school. There are three different ways to classify insomnia: Difficulty falling asleep. Difficulty staying asleep. Waking up too early in the morning. Any type of insomnia can be long-term (chronic) or short-term (acute). Both are common. Short-term insomnia usually lasts for 3 months or less. Chronic insomnia occurs at least three times a week for longer than 3 months. What are the causes? Insomnia may be caused by another condition, situation, or substance, such as: Having certain mental health conditions, such as anxiety and depression. Using caffeine, alcohol, tobacco, or drugs. Having gastrointestinal conditions, such as gastroesophageal reflux disease (GERD). Having certain medical conditions. These include: Asthma. Alzheimer's disease. Stroke. Chronic pain. An overactive thyroid gland (hyperthyroidism). Other sleep disorders, such as restless legs syndrome and sleep apnea. Menopause. Sometimes, the cause of insomnia may not be known. What increases the risk? Risk factors for insomnia include: Gender. Females are affected more often than males. Age. Insomnia is more common as people get older. Stress and certain medical and mental health conditions. Lack of exercise. Having an irregular work schedule. This may include working night shifts and traveling between different time zones. What are the signs or symptoms? If you have insomnia, the main symptom is having trouble falling asleep or having trouble staying asleep. This may lead to other symptoms, such as: Feeling tired or having low energy. Feeling nervous about going to sleep. Not feeling rested in the morning. Having trouble concentrating. Feeling irritable, anxious, or depressed. How is this diagnosed? This condition  may be diagnosed based on: Your symptoms and medical history. Your health care provider may ask about: Your sleep habits. Any medical conditions you have. Your mental health. A physical exam. How is this treated? Treatment for insomnia depends on the cause. Treatment may focus on treating an underlying condition that is causing the insomnia. Treatment may also include: Medicines to help you sleep. Counseling or therapy. Lifestyle adjustments to help you sleep better. Follow these instructions at home: Eating and drinking  Limit or avoid alcohol, caffeinated beverages, and products that contain nicotine and tobacco, especially close to bedtime. These can disrupt your sleep. Do not eat a large meal or eat spicy foods right before bedtime. This can lead to digestive discomfort that can make it hard for you to sleep. Sleep habits  Keep a sleep diary to help you and your health care provider figure out what could be causing your insomnia. Write down: When you sleep. When you wake up during the night. How well you sleep and how rested you feel the next day. Any side effects of medicines you are taking. What you eat and drink. Make your bedroom a dark, comfortable place where it is easy to fall asleep. Put up shades or blackout curtains to block light from outside. Use a white noise machine to block noise. Keep the temperature cool. Limit screen use before bedtime. This includes: Not watching TV. Not using your smartphone, tablet, or computer. Stick to a routine that includes going to bed and waking up at the same times every day and night. This can help you fall asleep faster. Consider making a quiet activity, such as reading, part of your nighttime routine. Try to avoid taking naps during the day so that you sleep better at night. Get out of bed if you are still awake after   15 minutes of trying to sleep. Keep the lights down, but try reading or doing a quiet activity. When you feel  sleepy, go back to bed. General instructions Take over-the-counter and prescription medicines only as told by your health care provider. Exercise regularly as told by your health care provider. However, avoid exercising in the hours right before bedtime. Use relaxation techniques to manage stress. Ask your health care provider to suggest some techniques that may work well for you. These may include: Breathing exercises. Routines to release muscle tension. Visualizing peaceful scenes. Make sure that you drive carefully. Do not drive if you feel very sleepy. Keep all follow-up visits. This is important. Contact a health care provider if: You are tired throughout the day. You have trouble in your daily routine due to sleepiness. You continue to have sleep problems, or your sleep problems get worse. Get help right away if: You have thoughts about hurting yourself or someone else. Get help right away if you feel like you may hurt yourself or others, or have thoughts about taking your own life. Go to your nearest emergency room or: Call 911. Call the National Suicide Prevention Lifeline at 1-800-273-8255 or 988. This is open 24 hours a day. Text the Crisis Text Line at 741741. Summary Insomnia is a sleep disorder that makes it difficult to fall asleep or stay asleep. Insomnia can be long-term (chronic) or short-term (acute). Treatment for insomnia depends on the cause. Treatment may focus on treating an underlying condition that is causing the insomnia. Keep a sleep diary to help you and your health care provider figure out what could be causing your insomnia. This information is not intended to replace advice given to you by your health care provider. Make sure you discuss any questions you have with your health care provider. Document Revised: 05/28/2021 Document Reviewed: 05/28/2021 [employer] Patient Education  2024 [employer]  

## 2023-01-14 LAB — TSH: TSH: 2.18 u[IU]/mL (ref 0.450–4.500)

## 2023-01-31 ENCOUNTER — Other Ambulatory Visit: Payer: Self-pay | Admitting: Family

## 2023-01-31 DIAGNOSIS — R42 Dizziness and giddiness: Secondary | ICD-10-CM

## 2023-02-03 ENCOUNTER — Other Ambulatory Visit: Payer: Self-pay | Admitting: Family

## 2023-02-03 DIAGNOSIS — E1169 Type 2 diabetes mellitus with other specified complication: Secondary | ICD-10-CM

## 2023-02-03 MED ORDER — LEVOTHYROXINE SODIUM 75 MCG PO TABS: 75.0000 ug | ORAL_TABLET | Freq: Every day | ORAL | 11 refills | Status: DC

## 2023-02-05 ENCOUNTER — Other Ambulatory Visit: Payer: Self-pay | Admitting: Family

## 2023-02-05 DIAGNOSIS — E1169 Type 2 diabetes mellitus with other specified complication: Secondary | ICD-10-CM

## 2023-02-12 ENCOUNTER — Ambulatory Visit (INDEPENDENT_AMBULATORY_CARE_PROVIDER_SITE_OTHER): Payer: Medicare Other

## 2023-02-12 VITALS — Ht 61.0 in | Wt 153.0 lb

## 2023-02-12 DIAGNOSIS — Z0000 Encounter for general adult medical examination without abnormal findings: Secondary | ICD-10-CM

## 2023-02-12 NOTE — Patient Instructions (Signed)
Alexandria Irwin , Thank you for taking time to come for your Medicare Wellness Visit. I appreciate your ongoing commitment to your health goals. Please review the following plan we discussed and let me know if I can assist you in the future.   Referrals/Orders/Follow-Ups/Clinician Recommendations: Aim for 30 minutes of exercise or brisk walking, 6-8 glasses of water, and 5 servings of fruits and vegetables each day.   This is a list of the screening recommended for you and due dates:  Health Maintenance  Topic Date Due   Eye exam for diabetics  05/16/2021   COVID-19 Vaccine (6 - 2023-24 season) 03/01/2022   Flu Shot  01/30/2023   Complete foot exam   02/08/2023   Hemoglobin A1C  05/10/2023   Yearly kidney function blood test for diabetes  11/07/2023   Yearly kidney health urinalysis for diabetes  11/07/2023   Medicare Annual Wellness Visit  02/12/2024   DTaP/Tdap/Td vaccine (2 - Td or Tdap) 12/11/2028   Pneumonia Vaccine  Completed   DEXA scan (bone density measurement)  Completed   Hepatitis C Screening  Completed   Zoster (Shingles) Vaccine  Completed   HPV Vaccine  Aged Out   Colon Cancer Screening  Discontinued    Advanced directives: (Provided) Advance directive discussed with you today. I have provided a copy for you to complete at home and have notarized. Once this is complete, please bring a copy in to our office so we can scan it into your chart. Information on Advanced Care Planning can be found at [facility] Advance Health Care Directives Advance Health Care Directives ([email or web address]    Next Medicare Annual Wellness Visit scheduled for next year: Yes  Preventive Care 65 Years and Older, Female Preventive care refers to lifestyle choices and visits with your health care provider that can promote health and wellness. What does preventive care include? A yearly physical exam. This is also called an annual well check. Dental exams once or twice a year. Routine  eye exams. Ask your health care provider how often you should have your eyes checked. Personal lifestyle choices, including: Daily care of your teeth and gums. Regular physical activity. Eating a healthy diet. Avoiding tobacco and drug use. Limiting alcohol use. Practicing safe sex. Taking low-dose aspirin every day. Taking vitamin and mineral supplements as recommended by your health care provider. What happens during an annual well check? The services and screenings done by your health care provider during your annual well check will depend on your age, overall health, lifestyle risk factors, and family history of disease. Counseling  Your health care provider may ask you questions about your: Alcohol use. Tobacco use. Drug use. Emotional well-being. Home and relationship well-being. Sexual activity. Eating habits. History of falls. Memory and ability to understand (cognition). Work and work Astronomer. Reproductive health. Screening  You may have the following tests or measurements: Height, weight, and BMI. Blood pressure. Lipid and cholesterol levels. These may be checked every 5 years, or more frequently if you are over 1 years old. Skin check. Lung cancer screening. You may have this screening every year starting at age 36 if you have a 30-pack-year history of smoking and currently smoke or have quit within the past 15 years. Fecal occult blood test (FOBT) of the stool. You may have this test every year starting at age 22. Flexible sigmoidoscopy or colonoscopy. You may have a sigmoidoscopy every 5 years or a colonoscopy every 10 years starting at age 23. Hepatitis  C blood test. Hepatitis B blood test. Sexually transmitted disease (STD) testing. Diabetes screening. This is done by checking your blood sugar (glucose) after you have not eaten for a while (fasting). You may have this done every 1-3 years. Bone density scan. This is done to screen for osteoporosis. You may  have this done starting at age 47. Mammogram. This may be done every 1-2 years. Talk to your health care provider about how often you should have regular mammograms. Talk with your health care provider about your test results, treatment options, and if necessary, the need for more tests. Vaccines  Your health care provider may recommend certain vaccines, such as: Influenza vaccine. This is recommended every year. Tetanus, diphtheria, and acellular pertussis (Tdap, Td) vaccine. You may need a Td booster every 10 years. Zoster vaccine. You may need this after age 24. Pneumococcal 13-valent conjugate (PCV13) vaccine. One dose is recommended after age 33. Pneumococcal polysaccharide (PPSV23) vaccine. One dose is recommended after age 46. Talk to your health care provider about which screenings and vaccines you need and how often you need them. This information is not intended to replace advice given to you by your health care provider. Make sure you discuss any questions you have with your health care provider. Document Released: 07/14/2015 Document Revised: 03/06/2016 Document Reviewed: 04/18/2015 Elsevier Interactive Patient Education  2017 [employer].  Fall Prevention in the Home Falls can cause injuries. They can happen to people of all ages. There are many things you can do to make your home safe and to help prevent falls. What can I do on the outside of my home? Regularly fix the edges of walkways and driveways and fix any cracks. Remove anything that might make you trip as you walk through a door, such as a raised step or threshold. Trim any bushes or trees on the path to your home. Use bright outdoor lighting. Clear any walking paths of anything that might make someone trip, such as rocks or tools. Regularly check to see if handrails are loose or broken. Make sure that both sides of any steps have handrails. Any raised decks and porches should have guardrails on the edges. Have any  leaves, snow, or ice cleared regularly. Use sand or salt on walking paths during winter. Clean up any spills in your garage right away. This includes oil or grease spills. What can I do in the bathroom? Use night lights. Install grab bars by the toilet and in the tub and shower. Do not use towel bars as grab bars. Use non-skid mats or decals in the tub or shower. If you need to sit down in the shower, use a plastic, non-slip stool. Keep the floor dry. Clean up any water that spills on the floor as soon as it happens. Remove soap buildup in the tub or shower regularly. Attach bath mats securely with double-sided non-slip rug tape. Do not have throw rugs and other things on the floor that can make you trip. What can I do in the bedroom? Use night lights. Make sure that you have a light by your bed that is easy to reach. Do not use any sheets or blankets that are too big for your bed. They should not hang down onto the floor. Have a firm chair that has side arms. You can use this for support while you get dressed. Do not have throw rugs and other things on the floor that can make you trip. What can I do in the  kitchen? Clean up any spills right away. Avoid walking on wet floors. Keep items that you use a lot in easy-to-reach places. If you need to reach something above you, use a strong step stool that has a grab bar. Keep electrical cords out of the way. Do not use floor polish or wax that makes floors slippery. If you must use wax, use non-skid floor wax. Do not have throw rugs and other things on the floor that can make you trip. What can I do with my stairs? Do not leave any items on the stairs. Make sure that there are handrails on both sides of the stairs and use them. Fix handrails that are broken or loose. Make sure that handrails are as long as the stairways. Check any carpeting to make sure that it is firmly attached to the stairs. Fix any carpet that is loose or worn. Avoid  having throw rugs at the top or bottom of the stairs. If you do have throw rugs, attach them to the floor with carpet tape. Make sure that you have a light switch at the top of the stairs and the bottom of the stairs. If you do not have them, ask someone to add them for you. What else can I do to help prevent falls? Wear shoes that: Do not have high heels. Have rubber bottoms. Are comfortable and fit you well. Are closed at the toe. Do not wear sandals. If you use a stepladder: Make sure that it is fully opened. Do not climb a closed stepladder. Make sure that both sides of the stepladder are locked into place. Ask someone to hold it for you, if possible. Clearly mark and make sure that you can see: Any grab bars or handrails. First and last steps. Where the edge of each step is. Use tools that help you move around (mobility aids) if they are needed. These include: Canes. Walkers. Scooters. Crutches. Turn on the lights when you go into a dark area. Replace any light bulbs as soon as they burn out. Set up your furniture so you have a clear path. Avoid moving your furniture around. If any of your floors are uneven, fix them. If there are any pets around you, be aware of where they are. Review your medicines with your doctor. Some medicines can make you feel dizzy. This can increase your chance of falling. Ask your doctor what other things that you can do to help prevent falls. This information is not intended to replace advice given to you by your health care provider. Make sure you discuss any questions you have with your health care provider. Document Released: 04/13/2009 Document Revised: 11/23/2015 Document Reviewed: 07/22/2014 Elsevier Interactive Patient Education  2017 [employer].

## 2023-02-12 NOTE — Progress Notes (Signed)
Subjective:   Alexandria Irwin is a 77 y.o. female who presents for Medicare Annual (Subsequent) preventive examination.  Visit Complete: Virtual  I connected with  Alexandria Irwin on 02/12/23 by a audio enabled telemedicine application and verified that I am speaking with the correct person using two identifiers.  Patient Location: Home  Provider Location: Home Office  I discussed the limitations of evaluation and management by telemedicine. The patient expressed understanding and agreed to proceed.  Patient Medicare AWV questionnaire was completed by the patient on 0814/2024; I have confirmed that all information answered by patient is correct and no changes since this date.  Review of Systems    Vital Signs: Unable to obtain new vitals due to this being a telehealth visit.  Cardiac Risk Factors include: advanced age (>32men, >45 women);diabetes mellitus;dyslipidemia;hypertension;sedentary lifestyleNutrition Risk Assessment:  Has the patient had any N/V/D within the last 2 months?  No  Does the patient have any non-healing wounds?  No  Has the patient had any unintentional weight loss or weight gain?  No   Diabetes:  Is the patient diabetic?  Yes  If diabetic, was a CBG obtained today?  No  Did the patient bring in their glucometer from home?  No  How often do you monitor your CBG's? 3 x day .   Financial Strains and Diabetes Management:  Are you having any financial strains with the device, your supplies or your medication? No .  Does the patient want to be seen by Chronic Care Management for management of their diabetes?  No  Would the patient like to be referred to a Nutritionist or for Diabetic Management?  No   Diabetic Exams:  Diabetic Eye Exam: Completed 10/2022 Diabetic Foot Exam: Overdue, Pt has been advised about the importance in completing this exam. Pt is scheduled for diabetic foot exam on next office visit .      Objective:    Today's Vitals   02/12/23 1501   Weight: 153 lb (69.4 kg)  Height: 5\' 1"  (1.549 m)   Body mass index is 28.91 kg/m.     02/12/2023    3:07 PM 11/11/2022   11:00 AM 01/31/2022    8:21 AM 11/12/2021   10:15 AM 01/30/2021    9:10 AM 11/09/2020   10:37 AM 08/09/2020   10:54 AM  Advanced Directives  Does Patient Have a Medical Advance Directive? No Unable to assess, patient is non-responsive or altered mental status No No No No No  Would patient like information on creating a medical advance directive? Yes (MAU/Ambulatory/Procedural Areas - Information given)  No - Patient declined No - Patient declined No - Patient declined No - Patient declined No - Patient declined    Current Medications (verified) Outpatient Encounter Medications as of 02/12/2023  Medication Sig   amLODipine (NORVASC) 5 MG tablet TAKE 1 TABLET (5 MG TOTAL) BY MOUTH DAILY.   Ascorbic Acid (VITAMIN C) 100 MG CHEW Chew 100 mg by mouth daily.   aspirin EC 81 MG tablet Take 81 mg by mouth daily.   blood glucose meter kit and supplies Dispense based on patient and insurance preference. Use up to four times daily as directed. (FOR ICD-10 E10.9, E11.9).   Cholecalciferol 50 MCG (2000 UT) TABS    clindamycin (CLEOCIN) 300 MG capsule Take 1 capsule (300 mg total) by mouth 3 (three) times daily.   clobetasol cream (TEMOVATE) 0.05 % Apply 1 Application topically 2 (two) times daily.   cycloSPORINE (RESTASIS)  0.05 % ophthalmic emulsion Place 1 drop into both eyes 2 (two) times daily.   dapagliflozin propanediol (FARXIGA) 10 MG TABS tablet Take 1 tablet (10 mg total) by mouth daily before breakfast.   furosemide (LASIX) 20 MG tablet TAKE 1/2 TABLET BY MOUTH DAILY   glimepiride (AMARYL) 4 MG tablet TAKE 1 TABLET BY MOUTH EVERY DAY WITH BREAKFAST   Lancets (ONETOUCH DELICA PLUS LANCET33G) MISC 1 each by Other route daily. Dx E11.9   levothyroxine (SYNTHROID) 75 MCG tablet Take 1 tablet (75 mcg total) by mouth daily.   losartan (COZAAR) 100 MG tablet Take 1 tablet by mouth  daily.   metFORMIN (GLUCOPHAGE-XR) 500 MG 24 hr tablet TAKE 2 TABLETS BY MOUTH EVERY DAY WITH BREAKFAST   metoprolol succinate (TOPROL-XL) 50 MG 24 hr tablet TAKE 1 TABLET (50 MG) BY MOUTH IN THE MORNING AND AT BEDTIME   ONETOUCH VERIO test strip USE TO TEST BLOOD SUGAR DAILY DX E11.9   simvastatin (ZOCOR) 40 MG tablet TAKE 1 TABLET BY MOUTH DAILY AT 6 PM.   [DISCONTINUED] metoprolol (LOPRESSOR) 50 MG tablet TAKE 1 TABLET (50 MG TOTAL) BY MOUTH 2 (TWO) TIMES DAILY.   No facility-administered encounter medications on file as of 02/12/2023.    Allergies (verified) Neomycin-bacitracin zn-polymyx, Ace inhibitors, Ciprocin-fluocin-procin [fluocinolone], Norvasc [amlodipine], Latex, and Sulfa antibiotics   History: Past Medical History:  Diagnosis Date   Cataract    Endometrial adenocarcinoma (HCC)    Environmental and seasonal allergies    Fall 07/2019   Family history of ovarian cancer    Family history of pancreatic cancer    Fx anat neck humerus-open 07/2019   Genetic testing 09/19/2017   Multi-Cancer panel (83 genes) @ [facility] - No pathogenic mutations detected   GERD (gastroesophageal reflux disease)    History of DVT of lower extremity    early 2000s--- lower right leg   History of radiation therapy 09/18/2017-10/13/2017   vaginal brachytherapy (HDR) - endometrial      Dr Antony Blackbird   Hyperlipidemia    Hypertension    Hypothyroidism    Type 2 diabetes mellitus (HCC)    followed by pcp   Umbilical hernia    Vitamin B 12 deficiency    Vitamin D deficiency    Wears glasses    Past Surgical History:  Procedure Laterality Date   BREAST BIOPSY Left 2009   benign   CATARACT EXTRACTION W/ INTRAOCULAR LENS IMPLANT Left 2018   CATARACT EXTRACTION W/PHACO Right 03/14/2014   Procedure: CATARACT EXTRACTION PHACO AND INTRAOCULAR LENS PLACEMENT (IOC);  Surgeon: Gemma Payor, MD;  Location: [facility];  Service: Ophthalmology;  Laterality: Right;  CDE: 8.34   COLONOSCOPY WITH PROPOFOL N/A  08/11/2020   Procedure: COLONOSCOPY WITH PROPOFOL;  Surgeon: Dolores Frame, MD;  Location: [facility];  Service: Gastroenterology;  Laterality: N/A;  7:30   FOOT GANGLION EXCISION Right 2013   HYSTEROSCOPY WITH D & C N/A 06/13/2017   Procedure: DILATATION AND CURETTAGE /HYSTEROSCOPY;  Surgeon: Ilda Mori, MD;  Location: [facility];  Service: Gynecology;  Laterality: N/A;   IR FLUORO GUIDE PORT INSERTION RIGHT  08/27/2017   IR REMOVAL TUN ACCESS W/ PORT W/O FL MOD SED  04/27/2018   IR US GUIDE VASC ACCESS RIGHT  08/27/2017   LAPAROSCOPIC CHOLECYSTECTOMY  2005   POLYPECTOMY  08/11/2020   Procedure: POLYPECTOMY INTESTINAL;  Surgeon: Dolores Frame, MD;  Location: [facility];  Service: Gastroenterology;;   ROBOTIC ASSISTED TOTAL HYSTERECTOMY WITH BILATERAL SALPINGO  OOPHERECTOMY Bilateral 08/05/2017   Procedure: ROBOTIC ASSISTED TOTAL HYSTERECTOMY WITH BILATERAL SALPINGO OOPHORECTOMY;  Surgeon: Adolphus Birchwood, MD;  Location: [facility];  Service: Gynecology;  Laterality: Bilateral;   SENTINEL NODE BIOPSY Bilateral 08/05/2017   Procedure: SENTINEL NODE BIOPSY;  Surgeon: Adolphus Birchwood, MD;  Location: [facility];  Service: Gynecology;  Laterality: Bilateral;   UMBILICAL HERNIA REPAIR N/A 08/05/2017   Procedure: HERNIA REPAIR UMBILICAL ADULT;  Surgeon: Adolphus Birchwood, MD;  Location: [facility];  Service: Gynecology;  Laterality: N/A;   VEIN SURGERY Right 2011   vein stripping-leg  and laser treatment   Family History  Problem Relation Age of Onset   Pancreatic cancer Mother 72       deceased 55   Ovarian cancer Mother        dx 25s   Cancer Other 90       maternal half-sister; unk. type   Social History   Socioeconomic History   Marital status: Widowed    Spouse name: Bethann Berkshire   Number of children: 4   Years of education: Not on file   Highest education level: 12th grade  Occupational History   Occupation: retired  Tobacco Use   Smoking status: Never   Smokeless tobacco: Never  Vaping  Use   Vaping status: Never Used  Substance and Sexual Activity   Alcohol use: No   Drug use: No   Sexual activity: Yes    Birth control/protection: Post-menopausal  Other Topics Concern   Not on file  Social History Narrative   Her husband passed 05/2020 - her daughter, Misty Stanley lives with her   Social Determinants of Health   Financial Resource Strain: Low Risk  (02/12/2023)   Overall Financial Resource Strain (CARDIA)    Difficulty of Paying Living Expenses: Not hard at all  Food Insecurity: No Food Insecurity (02/12/2023)   Hunger Vital Sign    Worried About Running Out of Food in the Last Year: Never true    Ran Out of Food in the Last Year: Never true  Transportation Needs: No Transportation Needs (02/12/2023)   PRAPARE - Administrator, Civil Service (Medical): No    Lack of Transportation (Non-Medical): No  Physical Activity: Inactive (02/12/2023)   Exercise Vital Sign    Days of Exercise per Week: 0 days    Minutes of Exercise per Session: 0 min  Stress: No Stress Concern Present (02/12/2023)   [employer] of Occupational Health - Occupational Stress Questionnaire    Feeling of Stress : Not at all  Social Connections: Socially Isolated (02/12/2023)   Social Connection and Isolation Panel [NHANES]    Frequency of Communication with Friends and Family: More than three times a week    Frequency of Social Gatherings with Friends and Family: More than three times a week    Attends Religious Services: Never    Database administrator or Organizations: No    Attends Banker Meetings: Never    Marital Status: Widowed    Tobacco Counseling Counseling given: Not Answered   Clinical Intake:  Pre-visit preparation completed: Yes  Pain : No/denies pain     Nutritional Risks: None Diabetes: Yes CBG done?: No Did pt. bring in CBG monitor from home?: No  How often do you need to have someone help you when you read instructions, pamphlets, or other  written materials from your doctor or pharmacy?: 1 - Never  Interpreter Needed?: No  Information entered by :: Renie Ora, LPN  Activities of Daily Living    02/12/2023    3:07 PM  In your present state of health, do you have any difficulty performing the following activities:  Hearing? 0  Vision? 0  Difficulty concentrating or making decisions? 0  Walking or climbing stairs? 0  Dressing or bathing? 0  Doing errands, shopping? 0  Preparing Food and eating ? N  Using the Toilet? N  In the past six months, have you accidently leaked urine? N  Do you have problems with loss of bowel control? N  Managing your Medications? N  Managing your Finances? N  Housekeeping or managing your Housekeeping? N    Patient Care Team: Junie Spencer, FNP as PCP - General (Family Medicine) Danella Maiers, [facility] as Pharmacist (Family Medicine) Michaelle Copas, MD as Referring Physician (Optometry)  Indicate any recent Medical Services you may have received from other than [facility] providers in the past year (date may be approximate).     Assessment:   This is a routine wellness examination for [facility].  Hearing/Vision screen Vision Screening - Comments:: Wears rx glasses - up to date with routine eye exams with  Dr.Le   Dietary issues and exercise activities discussed:     Goals Addressed             This Visit's Progress    DIET - EAT MORE FRUITS AND VEGETABLES   On track      Depression Screen    02/12/2023    3:06 PM 01/13/2023    9:31 AM 09/30/2022    9:12 AM 07/19/2022    9:50 AM 05/10/2022    8:52 AM 01/31/2022    8:19 AM 11/06/2021   11:05 AM  PHQ 2/9 Scores  PHQ - 2 Score 0 0 0 0 0 1 0  PHQ- 9 Score    0 0 1     Fall Risk    02/12/2023    3:05 PM 01/13/2023    9:31 AM 11/07/2022    8:35 AM 09/30/2022    9:11 AM 07/19/2022    9:50 AM  Fall Risk   Falls in the past year? 0 0  0 0  Number falls in past yr: 0 0 0    Injury with Fall? 0 0 0 0   Risk for fall due to : No  Fall Risks History of fall(s) No Fall Risks History of fall(s)   Follow up Falls prevention discussed Falls evaluation completed;Education provided Falls evaluation completed      MEDICARE RISK AT HOME:  Medicare Risk at Home - 02/12/23 1505     Any stairs in or around the home? No    If so, are there any without handrails? No    Home free of loose throw rugs in walkways, pet beds, electrical cords, etc? Yes    Adequate lighting in your home to reduce risk of falls? Yes    Life alert? No    Use of a cane, walker or w/c? No    Grab bars in the bathroom? Yes    Shower chair or bench in shower? Yes    Elevated toilet seat or a handicapped toilet? Yes             TIMED UP AND GO:  Was the test performed?  No    Cognitive Function:        02/12/2023    3:08 PM 01/31/2022    8:26 AM 01/30/2021    9:14  AM 01/28/2020    8:37 AM 12/18/2018    8:43 AM  6CIT Screen  What Year? 0 points 0 points 0 points 0 points 0 points  What month? 0 points 0 points 0 points 0 points 0 points  What time? 0 points 0 points 0 points 0 points 0 points  Count back from 20 0 points 0 points 0 points 0 points 0 points  Months in reverse 0 points 0 points 0 points 0 points 0 points  Repeat phrase 0 points 0 points 0 points 0 points 0 points  Total Score 0 points 0 points 0 points 0 points 0 points    Immunizations Immunization History  Administered Date(s) Administered   Fluad Quad(high Dose 65+) 04/30/2021, 05/10/2022   Influenza Whole 08/02/2011   Influenza,inj,Quad PF,6+ Mos 05/04/2013, 04/23/2014, 04/17/2015   Influenza-Unspecified 01/30/2016, 04/20/2019   Moderna Covid-19 Vaccine Bivalent Booster 89yrs & up 05/05/2021   PFIZER(Purple Top)SARS-COV-2 Vaccination 08/07/2019, 09/02/2019, 05/12/2020, 10/29/2020   Pneumococcal Conjugate-13 04/22/2017   Pneumococcal Polysaccharide-23 06/01/2018   Tdap 12/12/2018   Zoster Recombinant(Shingrix) 11/06/2021, 02/07/2022    TDAP status: Up to  date  Flu Vaccine status: Up to date  Pneumococcal vaccine status: Up to date  Covid-19 vaccine status: Completed vaccines  Qualifies for Shingles Vaccine? Yes   Zostavax completed Yes   Shingrix Completed?: Yes  Screening Tests Health Maintenance  Topic Date Due   OPHTHALMOLOGY EXAM  05/16/2021   COVID-19 Vaccine (6 - 2023-24 season) 03/01/2022   INFLUENZA VACCINE  01/30/2023   FOOT EXAM  02/08/2023   HEMOGLOBIN A1C  05/10/2023   Diabetic kidney evaluation - eGFR measurement  11/07/2023   Diabetic kidney evaluation - Urine ACR  11/07/2023   Medicare Annual Wellness (AWV)  02/12/2024   DTaP/Tdap/Td (2 - Td or Tdap) 12/11/2028   Pneumonia Vaccine 37+ Years old  Completed   DEXA SCAN  Completed   Hepatitis C Screening  Completed   Zoster Vaccines- Shingrix  Completed   HPV VACCINES  Aged Out   Colonoscopy  Discontinued    Health Maintenance  Health Maintenance Due  Topic Date Due   OPHTHALMOLOGY EXAM  05/16/2021   COVID-19 Vaccine (6 - 2023-24 season) 03/01/2022   INFLUENZA VACCINE  01/30/2023   FOOT EXAM  02/08/2023    Colorectal cancer screening: No longer required.   Mammogram status: No longer required due to age.  Bone Density status: Ordered declines . Pt provided with contact info and advised to call to schedule appt.  Lung Cancer Screening: (Low Dose CT Chest recommended if Age 53-80 years, 20 pack-year currently smoking OR have quit w/in 15years.) does not qualify.   Lung Cancer Screening Referral: n/a  Additional Screening:  Hepatitis C Screening: does not qualify;   Vision Screening: Recommended annual ophthalmology exams for early detection of glaucoma and other disorders of the eye. Is the patient up to date with their annual eye exam?  Yes  Who is the provider or what is the name of the office in which the patient attends annual eye exams? Dr.Le  If pt is not established with a provider, would they like to be referred to a provider to establish  care? No .   Dental Screening: Recommended annual dental exams for proper oral hygiene    Community Resource Referral / Chronic Care Management: CRR required this visit?  No   CCM required this visit?  No     Plan:     I have personally reviewed and noted  the following in the patient's chart:   Medical and social history Use of alcohol, tobacco or illicit drugs  Current medications and supplements including opioid prescriptions. Patient is not currently taking opioid prescriptions. Functional ability and status Nutritional status Physical activity Advanced directives List of other physicians Hospitalizations, surgeries, and ER visits in previous 12 months Vitals Screenings to include cognitive, depression, and falls Referrals and appointments  In addition, I have reviewed and discussed with patient certain preventive protocols, quality metrics, and best practice recommendations. A written personalized care plan for preventive services as well as general preventive health recommendations were provided to patient.     Lorrene Reid, LPN   1/61/0960   After Visit Summary: (MyChart) Due to this being a telephonic visit, the after visit summary with patients personalized plan was offered to patient via MyChart   Nurse Notes: none

## 2023-02-19 DIAGNOSIS — Z7984 Long term (current) use of oral hypoglycemic drugs: Secondary | ICD-10-CM | POA: Diagnosis not present

## 2023-02-19 DIAGNOSIS — E119 Type 2 diabetes mellitus without complications: Secondary | ICD-10-CM | POA: Diagnosis not present

## 2023-02-19 DIAGNOSIS — Z961 Presence of intraocular lens: Secondary | ICD-10-CM | POA: Diagnosis not present

## 2023-02-19 DIAGNOSIS — H16223 Keratoconjunctivitis sicca, not specified as Sjogren's, bilateral: Secondary | ICD-10-CM | POA: Diagnosis not present

## 2023-02-19 LAB — HM DIABETES EYE EXAM

## 2023-02-20 DIAGNOSIS — M79676 Pain in unspecified toe(s): Secondary | ICD-10-CM | POA: Diagnosis not present

## 2023-02-20 DIAGNOSIS — L84 Corns and callosities: Secondary | ICD-10-CM | POA: Diagnosis not present

## 2023-02-20 DIAGNOSIS — B351 Tinea unguium: Secondary | ICD-10-CM | POA: Diagnosis not present

## 2023-02-20 DIAGNOSIS — E1142 Type 2 diabetes mellitus with diabetic polyneuropathy: Secondary | ICD-10-CM | POA: Diagnosis not present

## 2023-02-27 DIAGNOSIS — R42 Dizziness and giddiness: Secondary | ICD-10-CM

## 2023-02-27 MED ORDER — FUROSEMIDE 20 MG PO TABS
10.0000 mg | ORAL_TABLET | Freq: Every day | ORAL | 1 refills | Status: DC
Start: 2023-02-27 — End: 2023-07-28

## 2023-02-27 MED ORDER — MECLIZINE HCL 25 MG PO TABS: 25.0000 mg | ORAL_TABLET | Freq: Three times a day (TID) | ORAL | 2 refills | Status: DC | PRN

## 2023-03-07 ENCOUNTER — Other Ambulatory Visit: Payer: Medicare Other

## 2023-03-07 ENCOUNTER — Ambulatory Visit: Payer: Medicare Other | Admitting: Family

## 2023-03-12 ENCOUNTER — Encounter (INDEPENDENT_AMBULATORY_CARE_PROVIDER_SITE_OTHER): Payer: Medicare Other

## 2023-03-12 DIAGNOSIS — B3731 Acute candidiasis of vulva and vagina: Secondary | ICD-10-CM

## 2023-03-14 MED ORDER — FLUCONAZOLE 150 MG PO TABS: 150.0000 mg | ORAL_TABLET | ORAL | 0 refills | Status: DC | PRN

## 2023-03-14 NOTE — Telephone Encounter (Signed)
I have sent in diflucan Prescription sent to pharmacy.   Approximately 5 minutes was spent documenting and reviewing patient's chart.

## 2023-03-19 ENCOUNTER — Other Ambulatory Visit: Payer: Self-pay | Admitting: Family

## 2023-03-19 DIAGNOSIS — I152 Hypertension secondary to endocrine disorders: Secondary | ICD-10-CM

## 2023-03-27 DIAGNOSIS — E1159 Type 2 diabetes mellitus with other circulatory complications: Secondary | ICD-10-CM

## 2023-03-27 MED ORDER — METOPROLOL SUCCINATE ER 50 MG PO TB24
50.0000 mg | ORAL_TABLET | Freq: Every day | ORAL | 1 refills | Status: DC
Start: 2023-03-27 — End: 2023-06-16

## 2023-03-27 MED ORDER — MECLIZINE HCL 25 MG PO TABS: 25.0000 mg | ORAL_TABLET | Freq: Three times a day (TID) | ORAL | 2 refills | Status: DC | PRN

## 2023-03-31 ENCOUNTER — Other Ambulatory Visit: Payer: Self-pay | Admitting: Family Medicine

## 2023-03-31 DIAGNOSIS — E1169 Type 2 diabetes mellitus with other specified complication: Secondary | ICD-10-CM

## 2023-03-31 MED ORDER — METFORMIN HCL ER 500 MG PO TB24: 500.0000 mg | ORAL_TABLET | Freq: Two times a day (BID) | ORAL | 0 refills | Status: DC

## 2023-04-01 ENCOUNTER — Other Ambulatory Visit: Payer: Self-pay | Admitting: Family

## 2023-04-15 ENCOUNTER — Encounter: Payer: Self-pay | Admitting: Family

## 2023-04-15 ENCOUNTER — Other Ambulatory Visit (INDEPENDENT_AMBULATORY_CARE_PROVIDER_SITE_OTHER): Payer: Medicare Other

## 2023-04-15 ENCOUNTER — Ambulatory Visit: Payer: Medicare Other | Admitting: Family

## 2023-04-15 VITALS — BP 120/75 | HR 57 | Temp 97.8°F | Ht 61.0 in | Wt 154.4 lb

## 2023-04-15 DIAGNOSIS — I152 Hypertension secondary to endocrine disorders: Secondary | ICD-10-CM | POA: Diagnosis not present

## 2023-04-15 DIAGNOSIS — K7460 Unspecified cirrhosis of liver: Secondary | ICD-10-CM

## 2023-04-15 DIAGNOSIS — Z23 Encounter for immunization: Secondary | ICD-10-CM

## 2023-04-15 DIAGNOSIS — Z780 Asymptomatic menopausal state: Secondary | ICD-10-CM | POA: Diagnosis not present

## 2023-04-15 DIAGNOSIS — E039 Hypothyroidism, unspecified: Secondary | ICD-10-CM | POA: Diagnosis not present

## 2023-04-15 DIAGNOSIS — G620 Drug-induced polyneuropathy: Secondary | ICD-10-CM

## 2023-04-15 DIAGNOSIS — E559 Vitamin D deficiency, unspecified: Secondary | ICD-10-CM

## 2023-04-15 DIAGNOSIS — T451X5A Adverse effect of antineoplastic and immunosuppressive drugs, initial encounter: Secondary | ICD-10-CM | POA: Diagnosis not present

## 2023-04-15 DIAGNOSIS — E663 Overweight: Secondary | ICD-10-CM

## 2023-04-15 DIAGNOSIS — E538 Deficiency of other specified B group vitamins: Secondary | ICD-10-CM

## 2023-04-15 DIAGNOSIS — E785 Hyperlipidemia, unspecified: Secondary | ICD-10-CM

## 2023-04-15 DIAGNOSIS — K760 Fatty (change of) liver, not elsewhere classified: Secondary | ICD-10-CM | POA: Diagnosis not present

## 2023-04-15 DIAGNOSIS — E1169 Type 2 diabetes mellitus with other specified complication: Secondary | ICD-10-CM | POA: Diagnosis not present

## 2023-04-15 DIAGNOSIS — E1159 Type 2 diabetes mellitus with other circulatory complications: Secondary | ICD-10-CM

## 2023-04-15 LAB — BAYER DCA HB A1C WAIVED: HB A1C (BAYER DCA - WAIVED): 6.5 % — ABNORMAL HIGH (ref 4.8–5.6)

## 2023-04-15 NOTE — Patient Instructions (Signed)
Health Maintenance After Age 79 After age 79, you are at a higher risk for certain long-term diseases and infections as well as injuries from falls. Falls are a major cause of broken bones and head injuries in people who are older than age 79. Getting regular preventive care can help to keep you healthy and well. Preventive care includes getting regular testing and making lifestyle changes as recommended by your health care provider. Talk with your health care provider about: Which screenings and tests you should have. A screening is a test that checks for a disease when you have no symptoms. A diet and exercise plan that is right for you. What should I know about screenings and tests to prevent falls? Screening and testing are the best ways to find a health problem early. Early diagnosis and treatment give you the best chance of managing medical conditions that are common after age 79. Certain conditions and lifestyle choices may make you more likely to have a fall. Your health care provider may recommend: Regular vision checks. Poor vision and conditions such as cataracts can make you more likely to have a fall. If you wear glasses, make sure to get your prescription updated if your vision changes. Medicine review. Work with your health care provider to regularly review all of the medicines you are taking, including over-the-counter medicines. Ask your health care provider about any side effects that may make you more likely to have a fall. Tell your health care provider if any medicines that you take make you feel dizzy or sleepy. Strength and balance checks. Your health care provider may recommend certain tests to check your strength and balance while standing, walking, or changing positions. Foot health exam. Foot pain and numbness, as well as not wearing proper footwear, can make you more likely to have a fall. Screenings, including: Osteoporosis screening. Osteoporosis is a condition that causes  the bones to get weaker and break more easily. Blood pressure screening. Blood pressure changes and medicines to control blood pressure can make you feel dizzy. Depression screening. You may be more likely to have a fall if you have a fear of falling, feel depressed, or feel unable to do activities that you used to do. Alcohol use screening. Using too much alcohol can affect your balance and may make you more likely to have a fall. Follow these instructions at home: Lifestyle Do not drink alcohol if: Your health care provider tells you not to drink. If you drink alcohol: Limit how much you have to: 0-1 drink a day for women. 0-2 drinks a day for men. Know how much alcohol is in your drink. In the [place] one drink equals one 12 oz bottle of beer (355 mL), one 5 oz glass of wine (148 mL), or one 1 oz glass of hard liquor (44 mL). Do not use any products that contain nicotine or tobacco. These products include cigarettes, chewing tobacco, and vaping devices, such as e-cigarettes. If you need help quitting, ask your health care provider. Activity  Follow a regular exercise program to stay fit. This will help you maintain your balance. Ask your health care provider what types of exercise are appropriate for you. If you need a cane or walker, use it as recommended by your health care provider. Wear supportive shoes that have nonskid soles. Safety  Remove any tripping hazards, such as rugs, cords, and clutter. Install safety equipment such as grab bars in bathrooms and safety rails on stairs. Keep rooms and walkways   well-lit. General instructions Talk with your health care provider about your risks for falling. Tell your health care provider if: You fall. Be sure to tell your health care provider about all falls, even ones that seem minor. You feel dizzy, tiredness (fatigue), or off-balance. Take over-the-counter and prescription medicines only as told by your health care provider. These include  supplements. Eat a healthy diet and maintain a healthy weight. A healthy diet includes low-fat dairy products, low-fat (lean) meats, and fiber from whole grains, beans, and lots of fruits and vegetables. Stay current with your vaccines. Schedule regular health, dental, and eye exams. Summary Having a healthy lifestyle and getting preventive care can help to protect your health and wellness after age 79. Screening and testing are the best way to find a health problem early and help you avoid having a fall. Early diagnosis and treatment give you the best chance for managing medical conditions that are more common for people who are older than age 79. Falls are a major cause of broken bones and head injuries in people who are older than age 79. Take precautions to prevent a fall at home. Work with your health care provider to learn what changes you can make to improve your health and wellness and to prevent falls. This information is not intended to replace advice given to you by your health care provider. Make sure you discuss any questions you have with your health care provider. Document Revised: 11/06/2020 Document Reviewed: 11/06/2020 Elsevier Patient Education  2024 [employer]  

## 2023-04-15 NOTE — Progress Notes (Signed)
Subjective:    Patient ID: Alexandria Irwin, female    DOB: June 16, 1946, 77 y.o.   MRN: [id number]  Chief Complaint  Patient presents with   Medical Management of Chronic Issues   Pt presents to the office today for chronic follow up. Pt has Endometrial Adenocarcinoma and followed by Oncologists annually. She has completed chemo and radiation. 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 She is followed by GI as needed for cirrhosis.    She is followed by Dermatologists for basal skin cancer.  Hypertension This is a chronic problem. The current episode started more than 1 year ago. The problem has been resolved since onset. The problem is controlled. Associated symptoms include malaise/fatigue and peripheral edema. Pertinent negatives include no blurred vision or shortness of breath. Risk factors for coronary artery disease include dyslipidemia and diabetes mellitus. The current treatment provides moderate improvement. Identifiable causes of hypertension include a thyroid problem.  Thyroid Problem Presents for follow-up visit. Symptoms include fatigue. Patient reports no constipation or diarrhea. The symptoms have been stable. Her past medical history is significant for hyperlipidemia.  Hyperlipidemia This is a chronic problem. Pertinent negatives include no shortness of breath. Current antihyperlipidemic treatment includes statins. The current treatment provides moderate improvement of lipids. Risk factors for coronary artery disease include dyslipidemia, diabetes mellitus, hypertension, a sedentary lifestyle and post-menopausal.  Diabetes She presents for her follow-up diabetic visit. She has type 2 diabetes mellitus. Associated symptoms include fatigue and foot paresthesias. Pertinent negatives for diabetes include no blurred vision. Diabetic complications include peripheral neuropathy. Risk factors for coronary artery disease include dyslipidemia, diabetes mellitus, hypertension,  sedentary lifestyle and post-menopausal. She is following a generally healthy diet. Her overall blood glucose range is 140-180 mg/dl. Eye exam is current.      Review of Systems  Constitutional:  Positive for fatigue and malaise/fatigue.  Eyes:  Negative for blurred vision.  Respiratory:  Negative for shortness of breath.   Gastrointestinal:  Negative for constipation and diarrhea.  All other systems reviewed and are negative.      Objective:   Physical Exam Vitals reviewed.  Constitutional:      General: She is not in acute distress.    Appearance: She is well-developed.  HENT:     Head: Normocephalic and atraumatic.     Right Ear: Tympanic membrane normal.     Left Ear: Tympanic membrane normal.  Eyes:     Pupils: Pupils are equal, round, and reactive to light.  Neck:     Thyroid: No thyromegaly.  Cardiovascular:     Rate and Rhythm: Normal rate and regular rhythm.     Heart sounds: Normal heart sounds. No murmur heard. Pulmonary:     Effort: Pulmonary effort is normal. No respiratory distress.     Breath sounds: Normal breath sounds. No wheezing.  Abdominal:     General: Bowel sounds are normal. There is no distension.     Palpations: Abdomen is soft.     Tenderness: There is no abdominal tenderness.  Musculoskeletal:        General: No tenderness. Normal range of motion.     Cervical back: Normal range of motion and neck supple.     Right lower leg: Edema (trace) present.     Left lower leg: Edema (trace) present.  Skin:    General: Skin is warm and dry.  Neurological:     Mental Status: She is alert and oriented to person, place, and time.  Cranial Nerves: No cranial nerve deficit.     Deep Tendon Reflexes: Reflexes are normal and symmetric.  Psychiatric:        Behavior: Behavior normal.        Thought Content: Thought content normal.        Judgment: Judgment normal.       BP 120/75   Pulse (!) 57   Temp 97.8 F (36.6 C) (Temporal)   Ht 5\' 1"   (1.549 m)   Wt 154 lb 6.4 oz (70 kg)   SpO2 98%   BMI 29.17 kg/m      Assessment & Plan:  LOUWANA GILGEN comes in today with chief complaint of Medical Management of Chronic Issues   Diagnosis and orders addressed:  1. Type 2 diabetes mellitus with other specified complication, without long-term current use of insulin (HCC) - CMP14+EGFR - CBC with Differential/Platelet - Bayer DCA Hb A1c Waived  2. Cirrhosis of liver without ascites, unspecified hepatic cirrhosis type (HCC) - CMP14+EGFR - CBC with Differential/Platelet  3. Fatty liver - CMP14+EGFR - CBC with Differential/Platelet  4. Hyperlipidemia associated with type 2 diabetes mellitus (HCC) - CMP14+EGFR - CBC with Differential/Platelet  5. Hypertension associated with diabetes (HCC)  - CMP14+EGFR - CBC with Differential/Platelet  6. Hypothyroidism, unspecified type - CMP14+EGFR - CBC with Differential/Platelet - TSH  7. Overweight (BMI 25.0-29.9) - CMP14+EGFR - CBC with Differential/Platelet  8. Peripheral neuropathy due to chemotherapy (HCC) - CMP14+EGFR - CBC with Differential/Platelet  9. Vitamin D deficiency - CMP14+EGFR - CBC with Differential/Platelet  10. B12 deficiency - CMP14+EGFR - CBC with Differential/Platelet - Vitamin B12  11. Post-menopausal - DG [facility] DEXA   Labs pending Health Maintenance reviewed Diet and exercise encouraged  Follow up plan: 4 months    Jannifer Rodney, FNP

## 2023-04-16 LAB — CMP14+EGFR
ALT: 20 IU/L (ref 0–32)
AST: 23 IU/L (ref 0–40)
Albumin: 4.7 g/dL (ref 3.8–4.8)
Alkaline Phosphatase: 43 IU/L — ABNORMAL LOW (ref 44–121)
BUN/Creatinine Ratio: 15 (ref 12–28)
BUN: 15 mg/dL (ref 8–27)
Bilirubin Total: 0.7 mg/dL (ref 0.0–1.2)
CO2: 25 mmol/L (ref 20–29)
Calcium: 10.2 mg/dL (ref 8.7–10.3)
Chloride: 102 mmol/L (ref 96–106)
Creatinine, Ser: 1 mg/dL (ref 0.57–1.00)
Globulin, Total: 2.7 g/dL (ref 1.5–4.5)
Glucose: 141 mg/dL — ABNORMAL HIGH (ref 70–99)
Potassium: 5.2 mmol/L (ref 3.5–5.2)
Sodium: 144 mmol/L (ref 134–144)
Total Protein: 7.4 g/dL (ref 6.0–8.5)
eGFR: 57 mL/min/{1.73_m2} — ABNORMAL LOW (ref >59)

## 2023-04-16 LAB — CBC WITH DIFFERENTIAL/PLATELET
Basophils Absolute: 0 10*3/uL (ref 0.0–0.2)
Basos: 1 %
EOS (ABSOLUTE): 0.1 10*3/uL (ref 0.0–0.4)
Eos: 2 %
Hematocrit: 46.7 % — ABNORMAL HIGH (ref 34.0–46.6)
Hemoglobin: 15.8 g/dL (ref 11.1–15.9)
Immature Grans (Abs): 0 10*3/uL (ref 0.0–0.1)
Immature Granulocytes: 0 %
Lymphocytes Absolute: 2.5 10*3/uL (ref 0.7–3.1)
Lymphs: 36 %
MCH: 32.9 pg (ref 26.6–33.0)
MCHC: 33.8 g/dL (ref 31.5–35.7)
MCV: 97 fL (ref 79–97)
Monocytes Absolute: 0.4 10*3/uL (ref 0.1–0.9)
Monocytes: 6 %
Neutrophils Absolute: 3.8 10*3/uL (ref 1.4–7.0)
Neutrophils: 55 %
Platelets: 201 10*3/uL (ref 150–450)
RBC: 4.8 x10E6/uL (ref 3.77–5.28)
RDW: 12.4 % (ref 11.7–15.4)
WBC: 6.9 10*3/uL (ref 3.4–10.8)

## 2023-04-16 LAB — TSH: TSH: 1.81 u[IU]/mL (ref 0.450–4.500)

## 2023-04-16 LAB — VITAMIN B12: Vitamin B-12: 372 pg/mL (ref 232–1245)

## 2023-04-18 DIAGNOSIS — Z780 Asymptomatic menopausal state: Secondary | ICD-10-CM | POA: Diagnosis not present

## 2023-05-01 ENCOUNTER — Other Ambulatory Visit: Payer: Self-pay | Admitting: Family

## 2023-05-01 DIAGNOSIS — M79676 Pain in unspecified toe(s): Secondary | ICD-10-CM | POA: Diagnosis not present

## 2023-05-01 DIAGNOSIS — E1142 Type 2 diabetes mellitus with diabetic polyneuropathy: Secondary | ICD-10-CM | POA: Diagnosis not present

## 2023-05-01 DIAGNOSIS — L84 Corns and callosities: Secondary | ICD-10-CM | POA: Diagnosis not present

## 2023-05-01 DIAGNOSIS — B351 Tinea unguium: Secondary | ICD-10-CM | POA: Diagnosis not present

## 2023-05-04 ENCOUNTER — Other Ambulatory Visit: Payer: Self-pay | Admitting: Family

## 2023-05-04 DIAGNOSIS — E1169 Type 2 diabetes mellitus with other specified complication: Secondary | ICD-10-CM

## 2023-05-07 ENCOUNTER — Other Ambulatory Visit: Payer: Self-pay

## 2023-05-07 MED ORDER — FLUCONAZOLE 150 MG PO TABS: 150.0000 mg | ORAL_TABLET | ORAL | 0 refills | Status: DC | PRN

## 2023-06-02 MED ORDER — MECLIZINE HCL 25 MG PO TABS: 25.0000 mg | ORAL_TABLET | Freq: Three times a day (TID) | ORAL | 2 refills | Status: DC | PRN

## 2023-06-02 NOTE — Addendum Note (Signed)
Addended by: Ignacia Bayley on: 06/02/2023 07:58 AM   Modules accepted: Orders

## 2023-06-05 DIAGNOSIS — C441191 Basal cell carcinoma of skin of left upper eyelid, including canthus: Secondary | ICD-10-CM | POA: Diagnosis not present

## 2023-06-05 DIAGNOSIS — H0279 Other degenerative disorders of eyelid and periocular area: Secondary | ICD-10-CM | POA: Diagnosis not present

## 2023-06-05 DIAGNOSIS — H02234 Paralytic lagophthalmos left upper eyelid: Secondary | ICD-10-CM | POA: Diagnosis not present

## 2023-06-05 DIAGNOSIS — H04123 Dry eye syndrome of bilateral lacrimal glands: Secondary | ICD-10-CM | POA: Diagnosis not present

## 2023-06-05 DIAGNOSIS — Z09 Encounter for follow-up examination after completed treatment for conditions other than malignant neoplasm: Secondary | ICD-10-CM | POA: Diagnosis not present

## 2023-06-05 DIAGNOSIS — H02423 Myogenic ptosis of bilateral eyelids: Secondary | ICD-10-CM | POA: Diagnosis not present

## 2023-06-14 ENCOUNTER — Other Ambulatory Visit: Payer: Self-pay | Admitting: Family

## 2023-06-14 DIAGNOSIS — E1159 Type 2 diabetes mellitus with other circulatory complications: Secondary | ICD-10-CM

## 2023-06-26 ENCOUNTER — Other Ambulatory Visit: Payer: Self-pay | Admitting: Family

## 2023-06-27 ENCOUNTER — Other Ambulatory Visit: Payer: Self-pay | Admitting: Family

## 2023-06-27 DIAGNOSIS — E1169 Type 2 diabetes mellitus with other specified complication: Secondary | ICD-10-CM

## 2023-07-01 ENCOUNTER — Other Ambulatory Visit: Payer: Self-pay | Admitting: Family

## 2023-07-01 DIAGNOSIS — E119 Type 2 diabetes mellitus without complications: Secondary | ICD-10-CM

## 2023-07-10 DIAGNOSIS — M79676 Pain in unspecified toe(s): Secondary | ICD-10-CM | POA: Diagnosis not present

## 2023-07-10 DIAGNOSIS — B351 Tinea unguium: Secondary | ICD-10-CM | POA: Diagnosis not present

## 2023-07-10 DIAGNOSIS — E1142 Type 2 diabetes mellitus with diabetic polyneuropathy: Secondary | ICD-10-CM | POA: Diagnosis not present

## 2023-07-10 DIAGNOSIS — L84 Corns and callosities: Secondary | ICD-10-CM | POA: Diagnosis not present

## 2023-07-26 ENCOUNTER — Other Ambulatory Visit: Payer: Self-pay | Admitting: Family

## 2023-07-26 DIAGNOSIS — R42 Dizziness and giddiness: Secondary | ICD-10-CM

## 2023-08-01 ENCOUNTER — Other Ambulatory Visit: Payer: Self-pay | Admitting: Family

## 2023-08-01 MED ORDER — FLUCONAZOLE 150 MG PO TABS: 150.0000 mg | ORAL_TABLET | ORAL | 0 refills | Status: DC | PRN

## 2023-08-03 ENCOUNTER — Telehealth: Payer: Medicare Other | Admitting: Nurse Practitioner

## 2023-08-03 DIAGNOSIS — J019 Acute sinusitis, unspecified: Secondary | ICD-10-CM

## 2023-08-03 DIAGNOSIS — B9689 Other specified bacterial agents as the cause of diseases classified elsewhere: Secondary | ICD-10-CM | POA: Diagnosis not present

## 2023-08-03 MED ORDER — DOXYCYCLINE HYCLATE 100 MG PO TABS: 100.0000 mg | ORAL_TABLET | Freq: Two times a day (BID) | ORAL | 0 refills | Status: AC

## 2023-08-03 NOTE — Progress Notes (Signed)

## 2023-08-03 NOTE — Progress Notes (Signed)
 I have spent 5 minutes in review of e-visit questionnaire, review and updating patient chart, medical decision making and response to patient.   Claiborne Rigg, NP

## 2023-08-18 ENCOUNTER — Ambulatory Visit: Payer: Medicare Other | Admitting: Family

## 2023-08-18 VITALS — BP 113/71 | HR 66 | Temp 97.8°F | Wt 153.6 lb

## 2023-08-18 DIAGNOSIS — T451X5A Adverse effect of antineoplastic and immunosuppressive drugs, initial encounter: Secondary | ICD-10-CM | POA: Diagnosis not present

## 2023-08-18 DIAGNOSIS — K7460 Unspecified cirrhosis of liver: Secondary | ICD-10-CM | POA: Diagnosis not present

## 2023-08-18 DIAGNOSIS — E663 Overweight: Secondary | ICD-10-CM | POA: Diagnosis not present

## 2023-08-18 DIAGNOSIS — E785 Hyperlipidemia, unspecified: Secondary | ICD-10-CM

## 2023-08-18 DIAGNOSIS — E1159 Type 2 diabetes mellitus with other circulatory complications: Secondary | ICD-10-CM | POA: Diagnosis not present

## 2023-08-18 DIAGNOSIS — G620 Drug-induced polyneuropathy: Secondary | ICD-10-CM

## 2023-08-18 DIAGNOSIS — R5383 Other fatigue: Secondary | ICD-10-CM | POA: Diagnosis not present

## 2023-08-18 DIAGNOSIS — T451X5D Adverse effect of antineoplastic and immunosuppressive drugs, subsequent encounter: Secondary | ICD-10-CM | POA: Diagnosis not present

## 2023-08-18 DIAGNOSIS — E039 Hypothyroidism, unspecified: Secondary | ICD-10-CM | POA: Diagnosis not present

## 2023-08-18 DIAGNOSIS — E1169 Type 2 diabetes mellitus with other specified complication: Secondary | ICD-10-CM | POA: Diagnosis not present

## 2023-08-18 DIAGNOSIS — E559 Vitamin D deficiency, unspecified: Secondary | ICD-10-CM

## 2023-08-18 DIAGNOSIS — I152 Hypertension secondary to endocrine disorders: Secondary | ICD-10-CM

## 2023-08-18 DIAGNOSIS — E538 Deficiency of other specified B group vitamins: Secondary | ICD-10-CM | POA: Diagnosis not present

## 2023-08-18 LAB — BAYER DCA HB A1C WAIVED: HB A1C (BAYER DCA - WAIVED): 7.1 % — ABNORMAL HIGH (ref 4.8–5.6)

## 2023-08-18 NOTE — Progress Notes (Signed)
Subjective:    Patient ID: Alexandria Irwin, female    DOB: 02/24/1946, 78 y.o.   MRN: [id number]  Chief Complaint  Patient presents with   Medical Management of Chronic Issues    Pt states she stays tired all the time and her memory is bad   Pt presents to the office today for chronic follow up. Pt has hx Endometrial Adenocarcinoma and followed by Oncologists annually. She has completed chemo and radiation. 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   She is followed by GI as needed for cirrhosis.    She is followed by Dermatologists for basal skin cancer.   Complaining of fatigue for the last few months.  Hypertension This is a chronic problem. The current episode started more than 1 year ago. The problem has been resolved since onset. The problem is controlled. Associated symptoms include malaise/fatigue and peripheral edema. Pertinent negatives include no blurred vision or shortness of breath. Risk factors for coronary artery disease include dyslipidemia and diabetes mellitus. The current treatment provides moderate improvement. Identifiable causes of hypertension include a thyroid problem.  Thyroid Problem Presents for follow-up visit. Symptoms include dry skin and fatigue. Patient reports no constipation or diarrhea. The symptoms have been stable.  Hyperlipidemia This is a chronic problem. The current episode started more than 1 year ago. The problem is controlled. Recent lipid tests were reviewed and are normal. Exacerbating diseases include obesity. She has no history of nephrotic syndrome. Pertinent negatives include no shortness of breath. Current antihyperlipidemic treatment includes statins. The current treatment provides moderate improvement of lipids. Risk factors for coronary artery disease include dyslipidemia, diabetes mellitus, hypertension, a sedentary lifestyle and post-menopausal.  Diabetes She presents for her follow-up diabetic visit. She has type 2  diabetes mellitus. Associated symptoms include fatigue and foot paresthesias. Pertinent negatives for diabetes include no blurred vision. Diabetic complications include peripheral neuropathy. Risk factors for coronary artery disease include dyslipidemia, diabetes mellitus, hypertension, sedentary lifestyle and post-menopausal. She is following a generally unhealthy diet. Her overall blood glucose range is 140-180 mg/dl. Eye exam is current.      Review of Systems  Constitutional:  Positive for fatigue and malaise/fatigue.  Eyes:  Negative for blurred vision.  Respiratory:  Negative for shortness of breath.   Gastrointestinal:  Negative for constipation and diarrhea.  All other systems reviewed and are negative.      Objective:   Physical Exam Vitals reviewed.  Constitutional:      General: She is not in acute distress.    Appearance: She is well-developed.  HENT:     Head: Normocephalic and atraumatic.     Right Ear: Tympanic membrane normal.     Left Ear: Tympanic membrane normal.  Eyes:     Pupils: Pupils are equal, round, and reactive to light.  Neck:     Thyroid: No thyromegaly.  Cardiovascular:     Rate and Rhythm: Normal rate and regular rhythm.     Heart sounds: Normal heart sounds. No murmur heard. Pulmonary:     Effort: Pulmonary effort is normal. No respiratory distress.     Breath sounds: Normal breath sounds. No wheezing.  Abdominal:     General: Bowel sounds are normal. There is no distension.     Palpations: Abdomen is soft.     Tenderness: There is no abdominal tenderness.  Musculoskeletal:        General: No tenderness. Normal range of motion.     Cervical back: Normal range of  motion and neck supple.     Right lower leg: Edema (trace) present.     Left lower leg: Edema (trace) present.  Skin:    General: Skin is warm and dry.  Neurological:     Mental Status: She is alert and oriented to person, place, and time.     Cranial Nerves: No cranial nerve  deficit.     Deep Tendon Reflexes: Reflexes are normal and symmetric.  Psychiatric:        Behavior: Behavior normal.        Thought Content: Thought content normal.        Judgment: Judgment normal.       BP 113/71   Pulse 66   Temp 97.8 F (36.6 C)   Wt 153 lb 9.6 oz (69.7 kg)   SpO2 96%   BMI 29.02 kg/m      Assessment & Plan:  Alexandria Irwin comes in today with chief complaint of Medical Management of Chronic Issues (Pt states she stays tired all the time and her memory is bad)   Diagnosis and orders addressed:  1. Type 2 diabetes mellitus with other specified complication, without long-term current use of insulin (HCC) (Primary) - Bayer DCA Hb A1c Waived - CMP14+EGFR  2. Cirrhosis of liver without ascites, unspecified hepatic cirrhosis type (HCC) - CMP14+EGFR  3. B12 deficiency - CMP14+EGFR - Anemia Profile B  4. Hyperlipidemia associated with type 2 diabetes mellitus (HCC) - CMP14+EGFR - Lipid panel  5. Hypertension associated with diabetes (HCC) - CMP14+EGFR  6. Hypothyroidism, unspecified type - CMP14+EGFR - TSH  7. Overweight (BMI 25.0-29.9) - CMP14+EGFR  8. Peripheral neuropathy due to chemotherapy (HCC) - CMP14+EGFR  9. Vitamin D deficiency - CMP14+EGFR  10. Other fatigue - CMP14+EGFR   Labs pending Continue current medications  Keep follow up with specialists  Health Maintenance reviewed Diet and exercise encouraged  Follow up plan: 4 months    Jannifer Rodney, FNP

## 2023-08-18 NOTE — Patient Instructions (Signed)

## 2023-08-19 LAB — CMP14+EGFR
ALT: 30 [IU]/L (ref 0–32)
AST: 34 [IU]/L (ref 0–40)
Albumin: 4.4 g/dL (ref 3.8–4.8)
Alkaline Phosphatase: 34 [IU]/L — ABNORMAL LOW (ref 44–121)
BUN/Creatinine Ratio: 20 (ref 12–28)
BUN: 23 mg/dL (ref 8–27)
Bilirubin Total: 0.9 mg/dL (ref 0.0–1.2)
CO2: 23 mmol/L (ref 20–29)
Calcium: 10 mg/dL (ref 8.7–10.3)
Chloride: 102 mmol/L (ref 96–106)
Creatinine, Ser: 1.13 mg/dL — ABNORMAL HIGH (ref 0.57–1.00)
Globulin, Total: 2.3 g/dL (ref 1.5–4.5)
Glucose: 235 mg/dL — ABNORMAL HIGH (ref 70–99)
Potassium: 4.5 mmol/L (ref 3.5–5.2)
Sodium: 141 mmol/L (ref 134–144)
Total Protein: 6.7 g/dL (ref 6.0–8.5)
eGFR: 49 mL/min/{1.73_m2} — ABNORMAL LOW (ref >59)

## 2023-08-19 LAB — TSH: TSH: 3.7 u[IU]/mL (ref 0.450–4.500)

## 2023-08-19 LAB — LIPID PANEL
Chol/HDL Ratio: 2.5 {ratio} (ref 0.0–4.4)
Cholesterol, Total: 123 mg/dL (ref 100–199)
HDL: 49 mg/dL (ref >39)
LDL Chol Calc (NIH): 51 mg/dL (ref 0–99)
Triglycerides: 130 mg/dL (ref 0–149)
VLDL Cholesterol Cal: 23 mg/dL (ref 5–40)

## 2023-08-19 LAB — ANEMIA PROFILE B
Basophils Absolute: 0 10*3/uL (ref 0.0–0.2)
Basos: 1 %
EOS (ABSOLUTE): 0.1 10*3/uL (ref 0.0–0.4)
Eos: 1 %
Ferritin: 42 ng/mL (ref 15–150)
Folate: 17.8 ng/mL (ref >3.0)
Hematocrit: 47.1 % — ABNORMAL HIGH (ref 34.0–46.6)
Hemoglobin: 15.9 g/dL (ref 11.1–15.9)
Immature Grans (Abs): 0 10*3/uL (ref 0.0–0.1)
Immature Granulocytes: 0 %
Iron Saturation: 40 % (ref 15–55)
Iron: 130 ug/dL (ref 27–139)
Lymphocytes Absolute: 2.3 10*3/uL (ref 0.7–3.1)
Lymphs: 28 %
MCH: 32.6 pg (ref 26.6–33.0)
MCHC: 33.8 g/dL (ref 31.5–35.7)
MCV: 97 fL (ref 79–97)
Monocytes Absolute: 0.6 10*3/uL (ref 0.1–0.9)
Monocytes: 8 %
Neutrophils Absolute: 5.3 10*3/uL (ref 1.4–7.0)
Neutrophils: 62 %
Platelets: 206 10*3/uL (ref 150–450)
RBC: 4.87 x10E6/uL (ref 3.77–5.28)
RDW: 12.6 % (ref 11.7–15.4)
Retic Ct Pct: 1.8 % (ref 0.6–2.6)
Total Iron Binding Capacity: 328 ug/dL (ref 250–450)
UIBC: 198 ug/dL (ref 118–369)
Vitamin B-12: 415 pg/mL (ref 232–1245)
WBC: 8.4 10*3/uL (ref 3.4–10.8)

## 2023-09-10 ENCOUNTER — Telehealth: Payer: Self-pay | Admitting: Family

## 2023-09-10 NOTE — Telephone Encounter (Signed)
 Patient wants to Re-enroll with AZ&ME to help with her Alexandria Irwin and needs help in doing this. Please reach out to patient about it or let us know what info we can give to her.

## 2023-09-11 ENCOUNTER — Telehealth: Payer: Self-pay | Admitting: *Deleted

## 2023-09-11 NOTE — Telephone Encounter (Signed)
 Talbert Forest from ([facility]) called office.  Pt is scheduled for a follow up on May 30  at 1 pm with Dr. Pricilla Holm.  Talbert Forest to notify pt of appointment date and time.

## 2023-09-11 NOTE — Telephone Encounter (Signed)
 Reached out to patient regarding az&me renewal for [place].   Patient doesn't mind having [place] complete application on her behalf. Household size of 1 (lives with daughter but have separate incomes). Monthly income about $1500 from SSI.

## 2023-09-11 NOTE — Telephone Encounter (Signed)
 CALLED PATIENT TO INFORM OF FU APPT. WITH DR. Pricilla Holm ON 11-28-23- ARRIVAL TIME- 12:45 PM, LVM FOR A RETURN CALL

## 2023-09-12 ENCOUNTER — Telehealth: Payer: Self-pay

## 2023-09-12 DIAGNOSIS — E1169 Type 2 diabetes mellitus with other specified complication: Secondary | ICD-10-CM

## 2023-09-16 MED ORDER — DAPAGLIFLOZIN PROPANEDIOL 10 MG PO TABS: 10.0000 mg | ORAL_TABLET | Freq: Every day | ORAL | 4 refills | Status: DC

## 2023-09-16 NOTE — Telephone Encounter (Signed)
Refills sent to [facility]

## 2023-09-16 NOTE — Progress Notes (Signed)
 Pharmacy Medication Assistance Program Note    09/16/2023  Patient ID: Alexandria Irwin, female   DOB: 07-20-1945, 78 y.o.   MRN: [id number]     09/12/2023  Outreach [facility]  Manufacturer [facility] [facility] Nurse, adult Drugs Farxiga  Dose of Farxiga 10MG   Type of Radiographer, therapeutic Assistance  Date Application Submitted to Manufacturer 09/16/2023  Method Application Sent to [employer]     NEW- submitted.   Please send new 90 day RX of medication to [facility] for assistance. Thanks!

## 2023-09-18 ENCOUNTER — Encounter (INDEPENDENT_AMBULATORY_CARE_PROVIDER_SITE_OTHER): Payer: Self-pay

## 2023-09-18 DIAGNOSIS — E1142 Type 2 diabetes mellitus with diabetic polyneuropathy: Secondary | ICD-10-CM | POA: Diagnosis not present

## 2023-09-18 DIAGNOSIS — B3731 Acute candidiasis of vulva and vagina: Secondary | ICD-10-CM

## 2023-09-18 DIAGNOSIS — B351 Tinea unguium: Secondary | ICD-10-CM | POA: Diagnosis not present

## 2023-09-18 DIAGNOSIS — M79676 Pain in unspecified toe(s): Secondary | ICD-10-CM | POA: Diagnosis not present

## 2023-09-18 DIAGNOSIS — L84 Corns and callosities: Secondary | ICD-10-CM | POA: Diagnosis not present

## 2023-09-18 MED ORDER — FLUCONAZOLE 150 MG PO TABS: 150.0000 mg | ORAL_TABLET | ORAL | 0 refills | Status: DC | PRN

## 2023-09-18 NOTE — Telephone Encounter (Signed)
 Hello, I have sent in diflucan to your pharmacy. I hope you feel better.   Jannifer Rodney, FNP  Approximately 5 minutes was spent documenting and reviewing patient's chart.

## 2023-09-24 ENCOUNTER — Other Ambulatory Visit: Payer: Self-pay | Admitting: Family

## 2023-09-25 ENCOUNTER — Other Ambulatory Visit: Payer: Self-pay | Admitting: Family

## 2023-09-25 DIAGNOSIS — E1169 Type 2 diabetes mellitus with other specified complication: Secondary | ICD-10-CM

## 2023-10-06 ENCOUNTER — Other Ambulatory Visit: Payer: Self-pay | Admitting: Family

## 2023-10-06 DIAGNOSIS — I152 Hypertension secondary to endocrine disorders: Secondary | ICD-10-CM

## 2023-10-22 ENCOUNTER — Other Ambulatory Visit: Payer: Self-pay | Admitting: Family

## 2023-10-22 DIAGNOSIS — R42 Dizziness and giddiness: Secondary | ICD-10-CM

## 2023-10-22 DIAGNOSIS — E1169 Type 2 diabetes mellitus with other specified complication: Secondary | ICD-10-CM

## 2023-10-23 ENCOUNTER — Other Ambulatory Visit: Payer: Self-pay | Admitting: Family

## 2023-11-20 ENCOUNTER — Other Ambulatory Visit: Payer: Self-pay | Admitting: Family

## 2023-11-27 DIAGNOSIS — L84 Corns and callosities: Secondary | ICD-10-CM | POA: Diagnosis not present

## 2023-11-27 DIAGNOSIS — M79674 Pain in right toe(s): Secondary | ICD-10-CM | POA: Diagnosis not present

## 2023-11-27 DIAGNOSIS — B351 Tinea unguium: Secondary | ICD-10-CM | POA: Diagnosis not present

## 2023-11-27 DIAGNOSIS — M79675 Pain in left toe(s): Secondary | ICD-10-CM | POA: Diagnosis not present

## 2023-11-27 DIAGNOSIS — E1142 Type 2 diabetes mellitus with diabetic polyneuropathy: Secondary | ICD-10-CM | POA: Diagnosis not present

## 2023-11-28 ENCOUNTER — Inpatient Hospital Stay: Attending: Gynecologic Oncology | Admitting: Gynecologic Oncology

## 2023-11-28 ENCOUNTER — Encounter: Payer: Self-pay | Admitting: Gynecologic Oncology

## 2023-11-28 VITALS — BP 114/63 | HR 66 | Temp 98.4°F | Resp 16 | Ht 61.0 in | Wt 152.8 lb

## 2023-11-28 DIAGNOSIS — Z8542 Personal history of malignant neoplasm of other parts of uterus: Secondary | ICD-10-CM | POA: Insufficient documentation

## 2023-11-28 DIAGNOSIS — Z9079 Acquired absence of other genital organ(s): Secondary | ICD-10-CM | POA: Diagnosis not present

## 2023-11-28 DIAGNOSIS — Z90722 Acquired absence of ovaries, bilateral: Secondary | ICD-10-CM | POA: Diagnosis not present

## 2023-11-28 DIAGNOSIS — C541 Malignant neoplasm of endometrium: Secondary | ICD-10-CM

## 2023-11-28 DIAGNOSIS — Z90710 Acquired absence of both cervix and uterus: Secondary | ICD-10-CM | POA: Insufficient documentation

## 2023-11-28 NOTE — Patient Instructions (Signed)
 It was good to see you today.  I do not see or feel any evidence of cancer recurrence on your exam.  Congratulations on graduating from needing to be seen here.  I would recommend that you see someone and have a pelvic exam in 1 year.  If your PCP not feel comfortable doing that, please call us  and we will get you scheduled to see Melissa next summer.  As always, if you develop any new and concerning symptoms before your next visit, please call to see me sooner.

## 2023-11-28 NOTE — Progress Notes (Signed)
 Gynecologic Oncology Return Clinic Visit  11/28/23  Reason for Visit: Follow-up in the setting of endometrial cancer   Treatment History: Oncology History Overview Note  endometroid MSI by IHC showed loss of nuclear expression, BRAF neg. Neg genetic testing   Endometrial adenocarcinoma (HCC)  06/13/2017 Procedure   PROCEDURE: Hysteroscopy, D&C   SURGEON: Richard D. Arvie Latus M.D.    FINDINGS: Hypertrophic endometrial tissue, no polyps, no myoma      06/13/2017 Pathology Results   Endometrium, curettage ENDOMETRIOID ADENOCARCINOMA, FIGO GRADE 1   08/05/2017 Pathology Results   1. Lymph node, sentinel, biopsy, right external iliac - NO CARCINOMA IDENTIFIED IN ONE LYMPH NODE (0/1) - SEE COMMENT 2. Lymph node, sentinel, biopsy, left external iliac #1 - NO CARCINOMA IDENTIFIED IN ONE LYMPH NODE (0/1) - SEE COMMENT 3. Lymph node, sentinel, biopsy, left external iliac #2 - NO CARCINOMA IDENTIFIED IN ONE LYMPH NODE (0/1) - SEE COMMENT 4. Uterus +/- tubes/ovaries, neoplastic, ovaries UTERUS: - ENDOMETRIOID CARCINOMA, FIGO GRADE 2, INVOLVING THE ENTIRE POSTERIOR MYOMETRIUM WITH TUMOR PRESENT AT INKED SEROSAL SURFACE - SEE ONCOLOGY TABLE BELOW CERVIX: - NABOTHIAN CYSTS - NO DYSPLASIA OR MALIGNANCY IDENTIFIED BILATERAL OVARIES: - NO CARCINOMA IDENTIFIED BILATERAL FALLOPIAN TUBES: - ENDOMETRIOID CARCINOMA INVOLVING LEFT FALLOPIAN TUBE  1. , 2 and 3. Cytokeratin AE1/3 was performed on the sentinel lymph nodes to exclude micrometastasis; they are negative. 4. ONCOLOGY TABLE-UTERUS, CARCINOMA OR CARCINOSARCOMA Specimen: Uterus, bilateral fallopian tubes and ovaries Procedure: Total hysterectomy and bilateral salpingo-oophorectomy Histologic type: Endometrioid carcinoma, NOS Grade: FIGO Grade 2 Myometrial invasion: >50% (The posterior myometrium has full thickness involvement with tumor extending to the inked serosal surface) Uterine Serosa involvement: Present Cervical stromal  involvement: Not identified Other Tissue/Organ Involvement: Left fallopian tube Lymphovascular invasion: Not identified Peritoneal washings: Not submitted/unknown Lymph nodes: Examined: 3 Sentinel 0 Non-sentinel 3 Total Lymph nodes with metastasis: 0 Isolated tumor cells (< 0.2 mm): 0 Micrometastasis: (> 0.2 mm and < 2.0 mm): 0 Macrometastasis: (> 2.0 mm): 0 Extracapsular extension: N/A TNM code: pT3a, pN0 FIGO Stage (based on pathologic findings, needs clinical correlation): IIIa *Block 4I is representative of the tumor and suitable for additional studies, if requested. COMMENT: MMR (by immunohistochemistry) is pending and will be reported in an addendum.   08/05/2017 Surgery   Operation: Robotic-assisted laparoscopic total hysterectomy with bilateral salpingoophorectomy, SLN biopsy, umbilical hernia   Surgeon: Diania Fortes    Operative Findings:  : 6cm normal appearing uterus, normal right tube and ovary, left ovary with cystic mass, left sigmoid colon diverticular disease, adhesions to left ovary.  No gross extrauterine disease.       08/14/2017 Imaging   1. Interval hysterectomy. No findings of recurrent or metastatic disease. 2. Small volume pelvic fluid and a presumed postoperative seroma or lymphangioma within the left hemipelvis. 3. Subtle irregular hepatic capsule is suspicious for mild cirrhosis. Correlate with risk factors. 4. Coronary artery atherosclerosis. Aortic Atherosclerosis (ICD10-I70.0).   08/27/2017 Procedure   Successful placement of a right internal jugular approach power injectable Port-A-Cath. The catheter is ready for immediate use.   09/02/2017 - 12/16/2017 Chemotherapy   The patient had carboplatin  and Taxol  x 6 cycles   09/11/2017 Genetic Testing   Patient has genetic testing done for: The genes analyzed were the 83 genes on [facility]'s Multi-Cancer panel (ALK, APC, ATM, AXIN2, BAP1, BARD1, BLM, BMPR1A, BRCA1, BRCA2, BRIP1, CASR, CDC73, CDH1, CDK4,  CDKN1B, CDKN1C, CDKN2A, CEBPA, CHEK2, CTNNA1, DICER1, DIS3L2, EGFR, EPCAM, FH, FLCN, GATA2, GPC3, GREM1, HOXB13, HRAS, KIT, MAX,  MEN1, MET, MITF, MLH1, MSH2, MSH3, MSH6, MUTYH, NBN, NF1, NF2, NTHL1, PALB2, PDGFRA, PHOX2B, PMS2, POLD1, POLE, POT1, PRKAR1A, PTCH1, PTEN, RAD50, RAD51C, RAD51D, RB1, RECQL4, RET, RUNX1, SDHA, SDHAF2, SDHB, SDHC, SDHD, SMAD4, SMARCA4, SMARCB1, SMARCE1, STK11, SUFU, TERC, TERT, TMEM127, TP53, TSC1, TSC2, VHL, WRN, WT1). Results revealed patient has no detectable mutation   09/18/2017 - 10/13/2017 Radiation Therapy   Radiation treatment dates:  09/18/17-10/13/17   Site/dose:  Vaginal cuff/ 30 Gy in 5 fractions   Beams/energy:  HDR Ir-192 Vaginal/ Iridium-192    01/13/2018 Imaging   1. Indistinct right pelvic sidewall density measuring up to 5.1 cm in long axis, possibly from adenopathy or recurrent tumor, versus some form of complex postoperative fluid collection. PET-CT may be helpful in further assessment of this region for metabolic activity. Alternatively, surveillance might be considered. 2. Left adnexal or pelvic sidewall fluid density structural has enlarged, currently 67 cubic cm and previously about 42 cubic cm. This fluid density structure is more sharply defined and could be a postoperative seroma along the adnexa/pelvic sidewall. 3. Diffuse abnormal wall thickening in the urinary bladder favoring cystitis. I am unsure whether this is infectious or due to prior radiation therapy. Small amount of gas in the urinary bladder, correlate with any recent catheterization. 4. Diffuse wall thickening in the distal esophagus and gastroesophageal junction, probably from esophagitis. 5. Other imaging findings of potential clinical significance: Aortic Atherosclerosis (ICD10-I70.0). Coronary atherosclerosis. Upper normal splenic size. Sigmoid diverticulosis. Scattered terminal ileum diverticula.   04/11/2018 Imaging   CT imaging 1. Status post hysterectomy and bilateral  oophorectomy. Interval decrease in size of fluid density structures along both pelvic sidewalls. This favors postoperative seromas or lymphangioma. No findings to suggest recurrent or metastatic disease. 2. Bladder wall thickening again suggest cystitis. Gas within for which correlation with instrumentation is suggested. 3. Coronary artery atherosclerosis. Aortic Atherosclerosis (ICD10-I70.0). 4. Subtle irregular hepatic capsule, again suspicious for cirrhosis. 5. Cholecystectomy with similar mild common duct dilatation. Normal bilirubin on 07/30/2017 suggests this is within normal variation.     04/27/2018 Procedure   Successful right IJ vein Port-A-Cath explant.     Interval History: Doing well.  Denies any vaginal bleeding or discharge.  Reports baseline bowel bladder function.  Denies any abdominal pain.  Past Medical/Surgical History: Past Medical History:  Diagnosis Date   Cataract    Endometrial adenocarcinoma (HCC)    Environmental and seasonal allergies    Fall 07/2019   Family history of ovarian cancer    Family history of pancreatic cancer    Fx anat neck humerus-open 07/2019   Genetic testing 09/19/2017   Multi-Cancer panel (83 genes) @ [facility] - No pathogenic mutations detected   GERD (gastroesophageal reflux disease)    History of DVT of lower extremity    early 2000s--- lower right leg   History of radiation therapy 09/18/2017-10/13/2017   vaginal brachytherapy ([facility]) - endometrial      Dr Retta Caster   Hyperlipidemia    Hypertension    Hypothyroidism    Type 2 diabetes mellitus (HCC)    followed by pcp   Umbilical hernia    Vitamin B 12 deficiency    Vitamin D  deficiency    Wears glasses     Past Surgical History:  Procedure Laterality Date   BREAST BIOPSY Left 2009   benign   CATARACT EXTRACTION W/ INTRAOCULAR LENS IMPLANT Left 2018   CATARACT EXTRACTION W/PHACO Right 03/14/2014   Procedure: CATARACT EXTRACTION PHACO AND INTRAOCULAR LENS PLACEMENT  (IOC);  Surgeon: Anner Kill, MD;  Location: [facility];  Service: Ophthalmology;  Laterality: Right;  CDE: 8.34   COLONOSCOPY WITH PROPOFOL  N/A 08/11/2020   Procedure: COLONOSCOPY WITH PROPOFOL ;  Surgeon: Urban Garden, MD;  Location: [facility];  Service: Gastroenterology;  Laterality: N/A;  7:30   FOOT GANGLION EXCISION Right 2013   HYSTEROSCOPY WITH D & C N/A 06/13/2017   Procedure: DILATATION AND CURETTAGE /HYSTEROSCOPY;  Surgeon: Astrid Blamer, MD;  Location: [facility];  Service: Gynecology;  Laterality: N/A;   IR FLUORO GUIDE PORT INSERTION RIGHT  08/27/2017   IR REMOVAL TUN ACCESS W/ PORT W/O FL MOD SED  04/27/2018   IR US  GUIDE VASC ACCESS RIGHT  08/27/2017   LAPAROSCOPIC CHOLECYSTECTOMY  2005   POLYPECTOMY  08/11/2020   Procedure: POLYPECTOMY INTESTINAL;  Surgeon: Urban Garden, MD;  Location: [facility];  Service: Gastroenterology;;   ROBOTIC ASSISTED TOTAL HYSTERECTOMY WITH BILATERAL SALPINGO OOPHERECTOMY Bilateral 08/05/2017   Procedure: ROBOTIC ASSISTED TOTAL HYSTERECTOMY WITH BILATERAL SALPINGO OOPHORECTOMY;  Surgeon: Alphonso Aschoff, MD;  Location: [facility];  Service: Gynecology;  Laterality: Bilateral;   SENTINEL NODE BIOPSY Bilateral 08/05/2017   Procedure: SENTINEL NODE BIOPSY;  Surgeon: Alphonso Aschoff, MD;  Location: [facility];  Service: Gynecology;  Laterality: Bilateral;   UMBILICAL HERNIA REPAIR N/A 08/05/2017   Procedure: HERNIA REPAIR UMBILICAL ADULT;  Surgeon: Alphonso Aschoff, MD;  Location: [facility];  Service: Gynecology;  Laterality: N/A;   VEIN SURGERY Right 2011   vein stripping-leg  and laser treatment    Family History  Problem Relation Age of Onset   Pancreatic cancer Mother 12       deceased 61   Ovarian cancer Mother        dx 102s   Cancer Other 57       maternal half-sister; unk. type    Social History   Socioeconomic History   Marital status: Widowed    Spouse name: Pascual Bonds   Number of children: 4   Years of education: Not on file   Highest  education level: 12th grade  Occupational History   Occupation: retired  Tobacco Use   Smoking status: Never   Smokeless tobacco: Never  Vaping Use   Vaping status: Never Used  Substance and Sexual Activity   Alcohol use: No   Drug use: No   Sexual activity: Yes    Birth control/protection: Post-menopausal  Other Topics Concern   Not on file  Social History Narrative   Her husband passed 05/2020 - her daughter, Edwina Gram lives with her   Social Drivers of Health   Financial Resource Strain: Low Risk  (02/12/2023)   Overall Financial Resource Strain (CARDIA)    Difficulty of Paying Living Expenses: Not hard at all  Food Insecurity: No Food Insecurity (11/26/2023)   Hunger Vital Sign    Worried About Running Out of Food in the Last Year: Never true    Ran Out of Food in the Last Year: Never true  Transportation Needs: No Transportation Needs (11/26/2023)   PRAPARE - Administrator, Civil Service (Medical): No    Lack of Transportation (Non-Medical): No  Physical Activity: Inactive (02/12/2023)   Exercise Vital Sign    Days of Exercise per Week: 0 days    Minutes of Exercise per Session: 0 min  Stress: No Stress Concern Present (02/12/2023)   [employer] of Occupational Health - Occupational Stress Questionnaire    Feeling of Stress : Not at all  Social Connections:  Socially Isolated (08/18/2023)   Social Connection and Isolation Panel [NHANES]    Frequency of Communication with Friends and Family: More than three times a week    Frequency of Social Gatherings with Friends and Family: More than three times a week    Attends Religious Services: Never    Database administrator or Organizations: No    Attends Banker Meetings: Never    Marital Status: Widowed    Current Medications:  Current Outpatient Medications:    amLODipine  (NORVASC ) 5 MG tablet, TAKE 1 TABLET (5 MG TOTAL) BY MOUTH DAILY., Disp: 90 tablet, Rfl: 0   Ascorbic Acid (VITAMIN C) 100 MG  CHEW, Chew 100 mg by mouth daily., Disp: , Rfl:    aspirin  EC 81 MG tablet, Take 81 mg by mouth daily., Disp: , Rfl:    blood glucose meter kit and supplies, Dispense based on patient and insurance preference. Use up to four times daily as directed. (FOR ICD-10 E10.9, E11.9)., Disp: 1 each, Rfl: 0   Cholecalciferol 50 MCG (2000 UT) TABS, , Disp: , Rfl:    dapagliflozin  propanediol (FARXIGA ) 10 MG TABS tablet, Take 1 tablet (10 mg total) by mouth daily before breakfast., Disp: 90 tablet, Rfl: 4   fluconazole  (DIFLUCAN ) 150 MG tablet, Take 1 tablet (150 mg total) by mouth every three (3) days as needed., Disp: 3 tablet, Rfl: 0   furosemide  (LASIX ) 20 MG tablet, TAKE 1/2 TABLET BY MOUTH DAILY, Disp: 45 tablet, Rfl: 0   glimepiride  (AMARYL ) 4 MG tablet, TAKE 1 TABLET BY MOUTH EVERY DAY WITH BREAKFAST, Disp: 90 tablet, Rfl: 0   glucose blood (ONETOUCH VERIO) test strip, USE TO TEST BLOOD SUGAR DAILY, Disp: 100 strip, Rfl: 9   Lancets (ONETOUCH DELICA PLUS LANCET33G) MISC, 1 each by Other route daily. Dx E11.9, Disp: 100 each, Rfl: 8   levothyroxine  (SYNTHROID ) 75 MCG tablet, Take 1 tablet (75 mcg total) by mouth daily before breakfast., Disp: 90 tablet, Rfl: 2   losartan  (COZAAR ) 100 MG tablet, Take 1 tablet by mouth daily., Disp: , Rfl:    meclizine  (ANTIVERT ) 25 MG tablet, TAKE 1 TABLET BY MOUTH 3 TIMES DAILY AS NEEDED FOR DIZZINESS., Disp: 60 tablet, Rfl: 2   metFORMIN  (GLUCOPHAGE -XR) 500 MG 24 hr tablet, TAKE 1 TABLET BY MOUTH TWICE A DAY WITH FOOD, Disp: 180 tablet, Rfl: 0   metoprolol  succinate (TOPROL -XL) 50 MG 24 hr tablet, TAKE 1 TABLET (50 MG) BY MOUTH IN THE MORNING AND AT BEDTIME, Disp: 180 tablet, Rfl: 0   simvastatin  (ZOCOR ) 40 MG tablet, TAKE 1 TABLET BY MOUTH DAILY AT 6 PM., Disp: 90 tablet, Rfl: 0  Review of Systems: Denies appetite changes, fevers, chills, fatigue, unexplained weight changes. Denies hearing loss, neck lumps or masses, mouth sores, ringing in ears or voice  changes. Denies cough or wheezing.  Denies shortness of breath. Denies chest pain or palpitations. Denies leg swelling. Denies abdominal distention, pain, blood in stools, constipation, diarrhea, nausea, vomiting, or early satiety. Denies pain with intercourse, dysuria, frequency, hematuria or incontinence. Denies hot flashes, pelvic pain, vaginal bleeding or vaginal discharge.   Denies joint pain, back pain or muscle pain/cramps. Denies itching, rash, or wounds. Denies dizziness, headaches, numbness or seizures. Denies swollen lymph nodes or glands, denies easy bruising or bleeding. Denies anxiety, depression, confusion, or decreased concentration.  Physical Exam: BP 114/63 (BP Location: Left Arm, Patient Position: Sitting)   Pulse 66   Temp 98.4 F (36.9 C) (Oral)  Resp 16   Ht 5\' 1"  (1.549 m)   Wt 152 lb 12.8 oz (69.3 kg)   SpO2 96%   BMI 28.87 kg/m  General: Alert, oriented, no acute distress. HEENT: Normocephalic, atraumatic, sclera anicteric. Chest: Clear to auscultation bilaterally.  Unlabored breathing on room air. Cardiovascular: Regular rate and rhythm, no murmurs. Abdomen: Obese, soft, nontender.  Normoactive bowel sounds.  No masses or hepatosplenomegaly appreciated.  Well-healed incisions.  Ventral hernia noted. Extremities: Grossly normal range of motion.  Warm, well perfused.  No edema bilaterally. Skin: No rashes or lesions noted. Lymphatics: No cervical, supraclavicular, or inguinal adenopathy. GU: Normal appearing external genitalia without erythema, excoriation, or lesions.  Speculum exam reveals moderately atrophic vaginal mucosa with radiation changes, somewhat shortened vagina.  Cuff visually intact, no masses, somewhat foreshortened vagina.  Bimanual exam reveals cuff intact, no nodularity or masses.  Rectovaginal exam confirms findings.  Laboratory & Radiologic Studies: None new  Assessment & Plan: Alexandria Irwin is a 78 y.o. 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 Genetic testing negative. S/p adjuvant carboplatin  and paclitaxel  chemotherapy x 6 cycles with adjuvant vaginal brachytherapy for local control completed in June, 2019 with a complete clinical response.   Patient is doing very well and is NED on exam today.     She is now nearly 6 years out from completion of treatment.  We discussed transitioning to yearly visits with an OB/GYN or seeing Melissa yearly for a couple of years.  Her preference is to follow with her PCP.  I have asked the patient's daughter to call if they would prefer to come see Melissa next year.  We reviewed signs and symptoms that would be concerning for disease recurrence and she knows to call if she develops any of these between visits.  20 minutes of total time was spent for this patient encounter, including preparation, face-to-face counseling with the patient and coordination of care, and documentation of the encounter.  Wiley Hanger, MD  Division of Gynecologic Oncology  Department of Obstetrics and Gynecology  [facility]

## 2023-12-03 ENCOUNTER — Ambulatory Visit

## 2023-12-16 ENCOUNTER — Ambulatory Visit: Payer: Medicare Other | Admitting: Family

## 2023-12-16 ENCOUNTER — Encounter: Payer: Self-pay | Admitting: Family

## 2023-12-16 VITALS — BP 130/74 | HR 60 | Temp 97.5°F | Ht 61.0 in | Wt 152.2 lb

## 2023-12-16 DIAGNOSIS — B3731 Acute candidiasis of vulva and vagina: Secondary | ICD-10-CM | POA: Diagnosis not present

## 2023-12-16 DIAGNOSIS — I152 Hypertension secondary to endocrine disorders: Secondary | ICD-10-CM

## 2023-12-16 DIAGNOSIS — K760 Fatty (change of) liver, not elsewhere classified: Secondary | ICD-10-CM

## 2023-12-16 DIAGNOSIS — E538 Deficiency of other specified B group vitamins: Secondary | ICD-10-CM | POA: Diagnosis not present

## 2023-12-16 DIAGNOSIS — E785 Hyperlipidemia, unspecified: Secondary | ICD-10-CM | POA: Diagnosis not present

## 2023-12-16 DIAGNOSIS — T451X5A Adverse effect of antineoplastic and immunosuppressive drugs, initial encounter: Secondary | ICD-10-CM | POA: Diagnosis not present

## 2023-12-16 DIAGNOSIS — E039 Hypothyroidism, unspecified: Secondary | ICD-10-CM

## 2023-12-16 DIAGNOSIS — Z7984 Long term (current) use of oral hypoglycemic drugs: Secondary | ICD-10-CM | POA: Diagnosis not present

## 2023-12-16 DIAGNOSIS — E1159 Type 2 diabetes mellitus with other circulatory complications: Secondary | ICD-10-CM | POA: Diagnosis not present

## 2023-12-16 DIAGNOSIS — E663 Overweight: Secondary | ICD-10-CM

## 2023-12-16 DIAGNOSIS — G620 Drug-induced polyneuropathy: Secondary | ICD-10-CM | POA: Diagnosis not present

## 2023-12-16 DIAGNOSIS — F411 Generalized anxiety disorder: Secondary | ICD-10-CM

## 2023-12-16 DIAGNOSIS — E1169 Type 2 diabetes mellitus with other specified complication: Secondary | ICD-10-CM | POA: Diagnosis not present

## 2023-12-16 DIAGNOSIS — E559 Vitamin D deficiency, unspecified: Secondary | ICD-10-CM

## 2023-12-16 DIAGNOSIS — R42 Dizziness and giddiness: Secondary | ICD-10-CM | POA: Diagnosis not present

## 2023-12-16 LAB — BAYER DCA HB A1C WAIVED: HB A1C (BAYER DCA - WAIVED): 7.5 % — ABNORMAL HIGH (ref 4.8–5.6)

## 2023-12-16 MED ORDER — ESCITALOPRAM OXALATE 5 MG PO TABS: ORAL_TABLET | ORAL | 0 refills | Status: DC

## 2023-12-16 MED ORDER — AMLODIPINE BESYLATE 5 MG PO TABS: 5.0000 mg | ORAL_TABLET | Freq: Every day | ORAL | 0 refills | Status: DC

## 2023-12-16 MED ORDER — FLUCONAZOLE 150 MG PO TABS: 150.0000 mg | ORAL_TABLET | ORAL | 0 refills | Status: DC | PRN

## 2023-12-16 MED ORDER — SIMVASTATIN 40 MG PO TABS: 40.0000 mg | ORAL_TABLET | Freq: Every day | ORAL | 2 refills | Status: AC

## 2023-12-16 MED ORDER — METFORMIN HCL ER 500 MG PO TB24: 500.0000 mg | ORAL_TABLET | Freq: Two times a day (BID) | ORAL | 0 refills | Status: DC

## 2023-12-16 MED ORDER — METOPROLOL SUCCINATE ER 50 MG PO TB24
50.0000 mg | ORAL_TABLET | Freq: Two times a day (BID) | ORAL | 2 refills | Status: DC
Start: 2023-12-16 — End: 2024-03-29

## 2023-12-16 MED ORDER — FUROSEMIDE 20 MG PO TABS: 10.0000 mg | ORAL_TABLET | Freq: Every day | ORAL | 0 refills | Status: DC

## 2023-12-16 MED ORDER — ESCITALOPRAM OXALATE 10 MG PO TABS: 10.0000 mg | ORAL_TABLET | Freq: Every day | ORAL | 3 refills | Status: DC

## 2023-12-16 NOTE — Patient Instructions (Signed)

## 2023-12-16 NOTE — Progress Notes (Signed)
 Subjective:    Patient ID: Alexandria Irwin, female    DOB: 04-01-1946, 78 y.o.   MRN: [id number]  Chief Complaint  Patient presents with   Medical Management of Chronic Issues    Sometimes she feels like she has bad nerves. Patient had yeast infection a week or 2 ago. She called in here and they would not call her in anything. Left had has rash she states happens every time after yeast infection.   Pt presents to the office today for chronic follow up.   Pt has hx Endometrial Adenocarcinoma and followed by Oncologists as needed. She has completed chemo and radiation. 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   She is followed by GI as needed for cirrhosis.    She is followed by Dermatologists for basal skin cancer.   Complaining of fatigue for the last few months.  Hypertension This is a chronic problem. The current episode started more than 1 year ago. The problem has been resolved since onset. The problem is controlled. Associated symptoms include malaise/fatigue and peripheral edema. Pertinent negatives include no blurred vision or shortness of breath. Risk factors for coronary artery disease include dyslipidemia and diabetes mellitus. The current treatment provides moderate improvement. Identifiable causes of hypertension include a thyroid  problem.  Thyroid  Problem Presents for follow-up visit. Symptoms include anxiety, dry skin and fatigue. Patient reports no constipation or diarrhea. The symptoms have been stable.  Hyperlipidemia This is a chronic problem. The current episode started more than 1 year ago. The problem is controlled. Recent lipid tests were reviewed and are normal. Exacerbating diseases include obesity. She has no history of nephrotic syndrome. Pertinent negatives include no shortness of breath. Current antihyperlipidemic treatment includes statins. The current treatment provides moderate improvement of lipids. Risk factors for coronary artery disease  include dyslipidemia, diabetes mellitus, hypertension, a sedentary lifestyle and post-menopausal.  Diabetes She presents for her follow-up diabetic visit. She has type 2 diabetes mellitus. Hypoglycemia symptoms include nervousness/anxiousness. Associated symptoms include fatigue and foot paresthesias. Pertinent negatives for diabetes include no blurred vision. Diabetic complications include peripheral neuropathy. Risk factors for coronary artery disease include dyslipidemia, diabetes mellitus, hypertension, sedentary lifestyle and post-menopausal. She is following a generally unhealthy diet. Her overall blood glucose range is 140-180 mg/dl. Eye exam is current.  Vaginal Discharge The patient's primary symptoms include genital itching and vaginal discharge. This is a recurrent problem. The current episode started in the past 7 days. The problem has been waxing and waning. Pertinent negatives include no constipation or diarrhea. The vaginal discharge was milky. There has been no bleeding. She has tried antifungals for the symptoms. The treatment provided mild relief.      Review of Systems  Constitutional:  Positive for fatigue and malaise/fatigue.  Eyes:  Negative for blurred vision.  Respiratory:  Negative for shortness of breath.   Gastrointestinal:  Negative for constipation and diarrhea.  Genitourinary:  Positive for vaginal discharge.  Psychiatric/Behavioral:  The patient is nervous/anxious.   All other systems reviewed and are negative.      Objective:   Physical Exam Vitals reviewed.  Constitutional:      General: She is not in acute distress.    Appearance: She is well-developed.  HENT:     Head: Normocephalic and atraumatic.     Right Ear: Tympanic membrane normal.     Left Ear: Tympanic membrane normal.   Eyes:     Pupils: Pupils are equal, round, and reactive to light.  Neck:     Thyroid : No thyromegaly.   Cardiovascular:     Rate and Rhythm: Normal rate and regular  rhythm.     Heart sounds: Normal heart sounds. No murmur heard. Pulmonary:     Effort: Pulmonary effort is normal. No respiratory distress.     Breath sounds: Normal breath sounds. No wheezing.  Abdominal:     General: Bowel sounds are normal. There is no distension.     Palpations: Abdomen is soft.     Tenderness: There is no abdominal tenderness.   Musculoskeletal:        General: No tenderness. Normal range of motion.     Cervical back: Normal range of motion and neck supple.     Right lower leg: Edema (trace) present.     Left lower leg: Edema (trace) present.   Skin:    General: Skin is warm and dry.   Neurological:     Mental Status: She is alert and oriented to person, place, and time.     Cranial Nerves: No cranial nerve deficit.     Deep Tendon Reflexes: Reflexes are normal and symmetric.   Psychiatric:        Mood and Affect: Mood is anxious.        Behavior: Behavior normal.        Thought Content: Thought content normal.        Judgment: Judgment normal.       BP 130/74   Pulse 60   Temp (!) 97.5 F (36.4 C) (Temporal)   Ht 5' 1 (1.549 m)   Wt 152 lb 3.2 oz (69 kg)   BMI 28.76 kg/m      Assessment & Plan:  MYLINDA BROOK comes in today with chief complaint of Medical Management of Chronic Issues (Sometimes she feels like she has bad nerves. Patient had yeast infection a week or 2 ago. She called in here and they would not call her in anything. Left had has rash she states happens every time after yeast infection.)   Diagnosis and orders addressed:  1. Vagina, candidiasis - fluconazole  (DIFLUCAN ) 150 MG tablet; Take 1 tablet (150 mg total) by mouth every three (3) days as needed.  Dispense: 3 tablet; Refill: 0 - CBC with Differential/Platelet - CMP14+EGFR  2. Dizziness - furosemide  (LASIX ) 20 MG tablet; Take 0.5 tablets (10 mg total) by mouth daily.  Dispense: 45 tablet; Refill: 0 - CBC with Differential/Platelet - CMP14+EGFR  3. Type 2 diabetes  mellitus with other specified complication, without long-term current use of insulin  (HCC) (Primary) - metFORMIN  (GLUCOPHAGE -XR) 500 MG 24 hr tablet; Take 1 tablet (500 mg total) by mouth 2 (two) times daily with a meal.  Dispense: 180 tablet; Refill: 0 - Microalbumin / creatinine urine ratio - Bayer DCA Hb A1c Waived - CBC with Differential/Platelet - CMP14+EGFR  4. Hypertension associated with diabetes (HCC) - metFORMIN  (GLUCOPHAGE -XR) 500 MG 24 hr tablet; Take 1 tablet (500 mg total) by mouth 2 (two) times daily with a meal.  Dispense: 180 tablet; Refill: 0 - metoprolol  succinate (TOPROL -XL) 50 MG 24 hr tablet; Take 1 tablet (50 mg total) by mouth in the morning and at bedtime. Take with or immediately following a meal.  Dispense: 180 tablet; Refill: 2 - CBC with Differential/Platelet - CMP14+EGFR  5. Hypothyroidism, unspecified type - CBC with Differential/Platelet - CMP14+EGFR  6. Hyperlipidemia associated with type 2 diabetes mellitus (HCC) - simvastatin  (ZOCOR ) 40 MG tablet; Take 1 tablet (40 mg total)  by mouth daily at 6 PM.  Dispense: 90 tablet; Refill: 2 - CBC with Differential/Platelet - CMP14+EGFR  7. 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 Overweight (BMI 25.0-29.9)  - CBC with Differential/Platelet - CMP14+EGFR  10. B12 deficiency - CBC with Differential/Platelet - CMP14+EGFR - Vitamin B12  11. Vitamin D  deficiency - CBC with Differential/Platelet - CMP14+EGFR  12. GAD (generalized anxiety disorder) Will start lexapro 5 mg for two weeks then increase to 10 mg after two weeks - CBC with Differential/Platelet - CMP14+EGFR - escitalopram (LEXAPRO) 5 MG tablet; Take 1 tablet (5 mg total) by mouth daily for 14 days, THEN 2 tablets (10 mg total) daily for 14 days.  Dispense: 42 tablet; Refill: 0 - escitalopram (LEXAPRO) 10 MG tablet; Take 1  tablet (10 mg total) by mouth daily.  Dispense: 90 tablet; Refill: 3  Labs pending Start Lexapro 5 mg for 2 weeks, then increase to 10 mg Stress management  Continue current medications  Keep follow up with specialists  Health Maintenance reviewed Diet and exercise encouraged  Follow up plan: 6 weeks for GAD   Tommas Fragmin, FNP

## 2023-12-17 LAB — CMP14+EGFR
ALT: 21 IU/L (ref 0–32)
AST: 23 IU/L (ref 0–40)
Albumin: 4.7 g/dL (ref 3.8–4.8)
Alkaline Phosphatase: 35 IU/L — ABNORMAL LOW (ref 44–121)
BUN/Creatinine Ratio: 13 (ref 12–28)
BUN: 14 mg/dL (ref 8–27)
Bilirubin Total: 1 mg/dL (ref 0.0–1.2)
CO2: 21 mmol/L (ref 20–29)
Calcium: 10.6 mg/dL — ABNORMAL HIGH (ref 8.7–10.3)
Chloride: 101 mmol/L (ref 96–106)
Creatinine, Ser: 1.12 mg/dL — ABNORMAL HIGH (ref 0.57–1.00)
Globulin, Total: 2.5 g/dL (ref 1.5–4.5)
Glucose: 199 mg/dL — ABNORMAL HIGH (ref 70–99)
Potassium: 5 mmol/L (ref 3.5–5.2)
Sodium: 142 mmol/L (ref 134–144)
Total Protein: 7.2 g/dL (ref 6.0–8.5)
eGFR: 50 mL/min/{1.73_m2} — ABNORMAL LOW (ref >59)

## 2023-12-17 LAB — CBC WITH DIFFERENTIAL/PLATELET
Basophils Absolute: 0 10*3/uL (ref 0.0–0.2)
Basos: 1 %
EOS (ABSOLUTE): 0.1 10*3/uL (ref 0.0–0.4)
Eos: 2 %
Hematocrit: 49.6 % — ABNORMAL HIGH (ref 34.0–46.6)
Hemoglobin: 16.2 g/dL — ABNORMAL HIGH (ref 11.1–15.9)
Immature Grans (Abs): 0 10*3/uL (ref 0.0–0.1)
Immature Granulocytes: 0 %
Lymphocytes Absolute: 2.5 10*3/uL (ref 0.7–3.1)
Lymphs: 34 %
MCH: 32.2 pg (ref 26.6–33.0)
MCHC: 32.7 g/dL (ref 31.5–35.7)
MCV: 99 fL — ABNORMAL HIGH (ref 79–97)
Monocytes Absolute: 0.4 10*3/uL (ref 0.1–0.9)
Monocytes: 6 %
Neutrophils Absolute: 4.3 10*3/uL (ref 1.4–7.0)
Neutrophils: 57 %
Platelets: 239 10*3/uL (ref 150–450)
RBC: 5.03 x10E6/uL (ref 3.77–5.28)
RDW: 12.8 % (ref 11.7–15.4)
WBC: 7.4 10*3/uL (ref 3.4–10.8)

## 2023-12-17 LAB — MICROALBUMIN / CREATININE URINE RATIO
Creatinine, Urine: 73.2 mg/dL
Microalb/Creat Ratio: 29 mg/g{creat} (ref 0–29)
Microalbumin, Urine: 21.2 ug/mL

## 2023-12-17 LAB — VITAMIN B12: Vitamin B-12: 590 pg/mL (ref 232–1245)

## 2023-12-22 ENCOUNTER — Ambulatory Visit: Payer: Self-pay | Admitting: Family

## 2024-01-01 ENCOUNTER — Other Ambulatory Visit: Payer: Self-pay | Admitting: Family

## 2024-01-01 DIAGNOSIS — E119 Type 2 diabetes mellitus without complications: Secondary | ICD-10-CM

## 2024-01-05 ENCOUNTER — Other Ambulatory Visit: Payer: Self-pay | Admitting: Family

## 2024-01-05 DIAGNOSIS — F411 Generalized anxiety disorder: Secondary | ICD-10-CM

## 2024-01-06 DIAGNOSIS — F411 Generalized anxiety disorder: Secondary | ICD-10-CM

## 2024-01-06 DIAGNOSIS — E1159 Type 2 diabetes mellitus with other circulatory complications: Secondary | ICD-10-CM

## 2024-01-06 DIAGNOSIS — E1169 Type 2 diabetes mellitus with other specified complication: Secondary | ICD-10-CM

## 2024-01-06 MED ORDER — ESCITALOPRAM OXALATE 10 MG PO TABS: 10.0000 mg | ORAL_TABLET | Freq: Every day | ORAL | 3 refills | Status: DC

## 2024-01-06 MED ORDER — MECLIZINE HCL 25 MG PO TABS: 25.0000 mg | ORAL_TABLET | Freq: Three times a day (TID) | ORAL | 2 refills | Status: DC | PRN

## 2024-01-06 MED ORDER — METFORMIN HCL ER 500 MG PO TB24: 500.0000 mg | ORAL_TABLET | Freq: Two times a day (BID) | ORAL | 2 refills | Status: AC

## 2024-01-18 ENCOUNTER — Other Ambulatory Visit: Payer: Self-pay | Admitting: Family

## 2024-01-18 DIAGNOSIS — E1169 Type 2 diabetes mellitus with other specified complication: Secondary | ICD-10-CM

## 2024-02-05 ENCOUNTER — Ambulatory Visit: Admitting: Family

## 2024-02-05 ENCOUNTER — Encounter: Payer: Self-pay | Admitting: Family

## 2024-02-05 VITALS — BP 113/70 | HR 64 | Temp 98.2°F | Ht 61.0 in | Wt 146.0 lb

## 2024-02-05 DIAGNOSIS — E1142 Type 2 diabetes mellitus with diabetic polyneuropathy: Secondary | ICD-10-CM | POA: Diagnosis not present

## 2024-02-05 DIAGNOSIS — M79674 Pain in right toe(s): Secondary | ICD-10-CM | POA: Diagnosis not present

## 2024-02-05 DIAGNOSIS — M79675 Pain in left toe(s): Secondary | ICD-10-CM | POA: Diagnosis not present

## 2024-02-05 DIAGNOSIS — F411 Generalized anxiety disorder: Secondary | ICD-10-CM | POA: Diagnosis not present

## 2024-02-05 DIAGNOSIS — L84 Corns and callosities: Secondary | ICD-10-CM | POA: Diagnosis not present

## 2024-02-05 DIAGNOSIS — B351 Tinea unguium: Secondary | ICD-10-CM | POA: Diagnosis not present

## 2024-02-05 NOTE — Progress Notes (Signed)
 Subjective:    Patient ID: Alexandria Irwin, female    DOB: January 08, 1946, 78 y.o.   MRN: [id number]  No chief complaint on file.  PT presents to the office today to follow up on GAD. She was seen on 12/16/23 and started on Lexapro  5 mg fro two weeks and then increased to 10 mg. She reports she can tell a difference with her anxiety and is sleeping better.  Anxiety Presents for follow-up visit. Symptoms include dry mouth, nervous/anxious behavior and panic. Patient reports no insomnia, irritability or suicidal ideas. Symptoms occur occasionally. The severity of symptoms is mild.        Review of Systems  Constitutional:  Negative for irritability.  Psychiatric/Behavioral:  Negative for suicidal ideas. The patient is nervous/anxious. The patient does not have insomnia.   All other systems reviewed and are negative.   Social History   Socioeconomic History   Marital status: Widowed    Spouse name: Alexandria Irwin   Number of children: 4   Years of education: Not on file   Highest education level: 12th grade  Occupational History   Occupation: retired  Tobacco Use   Smoking status: Never   Smokeless tobacco: Never  Vaping Use   Vaping status: Never Used  Substance and Sexual Activity   Alcohol use: No   Drug use: No   Sexual activity: Yes    Birth control/protection: Post-menopausal  Other Topics Concern   Not on file  Social History Narrative   Her husband passed 05/2020 - her daughter, Alexandria Irwin lives with her   Social Drivers of Health   Financial Resource Strain: Medium Risk (12/15/2023)   Overall Financial Resource Strain (CARDIA)    Difficulty of Paying Living Expenses: Somewhat hard  Food Insecurity: No Food Insecurity (12/15/2023)   Hunger Vital Sign    Worried About Running Out of Food in the Last Year: Never true    Ran Out of Food in the Last Year: Never true  Transportation Needs: No Transportation Needs (12/15/2023)   PRAPARE - Administrator, Civil Service  (Medical): No    Lack of Transportation (Non-Medical): No  Physical Activity: Insufficiently Active (12/15/2023)   Exercise Vital Sign    Days of Exercise per Week: 1 day    Minutes of Exercise per Session: 10 min  Stress: Stress Concern Present (12/15/2023)   [employer] of Occupational Health - Occupational Stress Questionnaire    Feeling of Stress: To some extent  Social Connections: Moderately Isolated (12/15/2023)   Social Connection and Isolation Panel    Frequency of Communication with Friends and Family: More than three times a week    Frequency of Social Gatherings with Friends and Family: Twice a week    Attends Religious Services: 1 to 4 times per year    Active Member of [employer] or Organizations: No    Attends Banker Meetings: Not on file    Marital Status: Widowed   Family History  Problem Relation Age of Onset   Pancreatic cancer Mother 65       deceased 48   Ovarian cancer Mother        dx 10s   Cancer Other 66       maternal half-sister; unk. type        Objective:   Physical Exam Vitals reviewed.  Constitutional:      General: She is not in acute distress.    Appearance: She is well-developed.  HENT:  Head: Normocephalic and atraumatic.     Right Ear: Tympanic membrane normal.     Left Ear: Tympanic membrane normal.  Eyes:     Pupils: Pupils are equal, round, and reactive to light.  Neck:     Thyroid : No thyromegaly.  Cardiovascular:     Rate and Rhythm: Normal rate and regular rhythm.     Heart sounds: Normal heart sounds. No murmur heard. Pulmonary:     Effort: Pulmonary effort is normal. No respiratory distress.     Breath sounds: Normal breath sounds. No wheezing.  Abdominal:     General: Bowel sounds are normal. There is no distension.     Palpations: Abdomen is soft.     Tenderness: There is no abdominal tenderness.  Musculoskeletal:        General: No tenderness. Normal range of motion.     Cervical back: Normal range  of motion and neck supple.  Skin:    General: Skin is warm and dry.  Neurological:     Mental Status: She is alert and oriented to person, place, and time.     Cranial Nerves: No cranial nerve deficit.     Deep Tendon Reflexes: Reflexes are normal and symmetric.  Psychiatric:        Behavior: Behavior normal.        Thought Content: Thought content normal.        Judgment: Judgment normal.       BP 113/70   Pulse 64   Temp 98.2 F (36.8 C)   Ht 5' 1 (1.549 m)   Wt 146 lb (66.2 kg)   SpO2 95%   BMI 27.59 kg/m      Assessment & Plan:  Alexandria Irwin comes in today with chief complaint of No chief complaint on file.   Diagnosis and orders addressed:  1. GAD (generalized anxiety disorder) (Primary) Greatly improved since Lexparo  Continue Lexapro  10 mg at this time. We did discuss that we could increase to 20 mg if needed in the future.  Stress management  Follow up in 4 months for chronic follow up  Bari Learn, FNP

## 2024-02-05 NOTE — Patient Instructions (Addendum)

## 2024-02-12 MED ORDER — FLUCONAZOLE 150 MG PO TABS: 150.0000 mg | ORAL_TABLET | ORAL | 0 refills | Status: DC | PRN

## 2024-02-13 ENCOUNTER — Ambulatory Visit: Payer: Medicare Other

## 2024-02-13 VITALS — BP 113/70 | Ht 61.0 in | Wt 146.0 lb

## 2024-02-13 DIAGNOSIS — Z0000 Encounter for general adult medical examination without abnormal findings: Secondary | ICD-10-CM

## 2024-02-13 NOTE — Patient Instructions (Signed)
 Alexandria Irwin , Thank you for taking time out of your busy schedule to complete your Annual Wellness Visit with me. I enjoyed our conversation and look forward to speaking with you again next year. I, as well as your care team,  appreciate your ongoing commitment to your health goals. Please review the following plan we discussed and let me know if I can assist you in the future. Your Game plan/ To Do List    Referrals: If you haven't heard from the office you've been referred to, please reach out to them at the phone provided.  GROUP/FACILITY NAME PROVIDER FIRST NAME PROVIDER LAST NAME PHONE NUMBER  BEAVERS & KEATING DDS PA ELIZABETH  APPLEBAUM [phone]  GOLDENBERG AND ROHLFING DDS PL MATTHEW  APPLEBAUM [phone]  TAMMY I. ARTIS,DDS, PA TAMMY  ARTIS (336) [phone]  SMILE STARTERS ANTWAN  BATES [phone]) [phone]  [facility] MICHAEL  BATTLE ([phone]) [phone]  PERRY L JEFFRIES DDS PA MARIEROSE  BOULOS ([phone]) [phone]  HIGH [facility] [phone]  PERRY L JEFFRIES DDS PA ANNALIESE  CARLSMITH [phone]) [phone]  CASHION SCOTT SCOTT  CASHION ([phone]) [phone]  CIVILS JANNA JANNA  CIVILS [phone]  J DAVID CIVILS AND JOLYNN BROCKS CIVILS D JOHN  CIVILS JR [phone]  J. PRINCE FELL, DDS, PA JAVON  COOPER [phone]  [facility] M DESHIELD DDS PLLC ANBEC  DESHIELD [phone]  PERRY L JEFFRIES DDS PA CONSTANCE  EPPS [phone]) [phone]  SMILE STARTERS CLARANELL  FLEMING [phone]) [phone]  [facility] DEATRICE KOBUS [phone]  ERIC J SADLER DDS AND ASSOCIATES PA ZACHARY MOONS JR [phone]  LEVORA BLACKBIRD [place]  GREENE [phone]  ALVERDA BONETA BONETA  HOLDAWAY [phone]  HYMAN MARK MARK  HYMAN [phone]  [facility] P[facility] DENT MICHAEL  IGNELZI [phone]  PERRY L JEFFRIES DDS PA SEPEHR  KAISER [phone]  [place] JAMES  KALEY [phone]  KATZ MARK MARK  KATZ (336) [phone]  PERRY L  JEFFRIES DDS PA AUDREY  KEMP [phone]) [phone]  [facility] DENTAL CARE SAE  KIM [phone]  [facility] & ASSOC[facility] GEOFFREY  KOELLING [phone]  MOS[facility] OPER RONALD  KULINSKI [phone]  DR DEWARD MEDIN PAUL  KWON [phone]  [facility] DENTAL CARE JAVIER  LAZARO [phone]  LILLY ERIC ERIC  LILLY [phone]  [place]  MACDONALD [phone]  ERIC J SADLER DDS AND ASSOCIATES PA JAMES  MCMASTERS [phone]) [phone]  MCNEAL HERBERT HERBERT  MCNEAL ([phone]) [phone]  SMILE STARTERS FELICIA  MCNEAL ([phone]) [phone]  CHRISTOPHER S MILLSAPS DDS PA CHRISTOPHER  MILLSAPS (336) [phone]  DR DEWARD MEDIN LAJEAN  MORROW (336) [phone]  SMILE STARTERS TARAMBAKUFA  MUKURAZHIZHA (336) [phone]  [facility] ELISE  NEWSOME [phone]) [phone]  SMILE STARTERS GABRIELA  NWOBU (336) [phone]  STEPHANIE P LINDSAY DDS MS PA STEPHANIE  PARKER ([phone]) [phone]  A K SHARDA DMD PA SHELLY  PENUMALLI (336) [phone]  PERENTIS RACHEL RACHEL  PERENTIS (366) [phone]  GOLDENBERG AND ROHLFING DDS PL KATE  PIERCE [phone]) [phone]  REDD TANYA TANYA  REDD ([phone]) [phone]  REID ANNIE ANNIE  REID [phone]  ROCKEY J ROTH VI, DDS PA KYLE  ROTH [phone]) [phone]  LAMBOY RUBIO DDS PA MAYRA  RUBIO (336) [phone]  LAMBOY RUBIO DDS PA JULIO LAMBOY RUIZ [phone]  [facility]  LAMAR METRO JR [phone]  ERIC J SADLER DDS AND ASSOCIATES PA ERIC  SADLER [phone]  ADINE DELENA SAVANT DDS PA BRADLEY  SAMUEL [phone]  [facility] DENTAL CARE STEVEN  SCHLUENTZ [phone]  [facility] MEIT  SEE [phone]  A K SHARDA DMD PA ARUN  SHARDA ([phone]) [phone]  SHELOR PAUL PAUL  SHELOR [phone]  LIM SI ON ON LIM SI [phone]  SILVA ENRICO ENRICO  SILVA (336) [phone]  SILVA HA HA  SILVA ([phone]) [phone]  [place] SHARON SHARON [place] [phone]  [facility]  SURLES [phone]  DEBBY MACINTOSH  PATRICIA  THOMAS [phone]  [place] SMILES ANTONINA CAMP [phone]  [facility]  WALLACE [phone]  [facility] JENNY  WESTON [phone]  [facility] BENJAMIN  WESTON [phone]  BROWN & [place] DDS PA ELSIE DARING JR [phone]  [facility] AND RUSSELL [facility] GARY  DESALVO [phone]  BROWN & [place] DDS PA CHRISTOPHER   (336) [phone]  JENSEN SCOTT SCOTT  JENSEN (336) [phone]  JOSEPH L MILLER DDS JOSEPH  MILLER ([phone]) [phone]  [facility] DAVID  MOHORN (336) [phone]  [place] SURGICAL ARTS PA TODD  OWSLEY [phone]  [place] ORAL AND MAXILLOFACIAL SU SHAWANA  PATTERSON ([phone]) [phone]  STEFAN JOHN SIMONCIC DDS PLLC STEFAN  SIMONCIC (336) [phone]  [place] MARK MARK  KALEY ([phone]) [phone]  ALISON J MCMILLIAN DDS MS PA ALISON  MCMILLIAN [phone]  DONNICE DOROTHA CARNE, DDS MS PLLC MATTHEW  OLMSTED [phone]  DONALD PARAS MCMILLIAN DDS MS PA WILLIAM  PENRY JR ([phone]) [phone]  [facility] DENT REBECCA  ANDREWS ([phone]) [phone]  PERRY L JEFFRIES DDS PA SAMANTHA  BRITT (336) [phone]  COBB H BRYAN DDS MS HERMAN  COBB [phone]  STEPHANIE P LINDSAY DDS MS PA KRISTINA  COFFIELD (336) [phone]  [facility] AND ROHLFING DDS PL MARC  GOLDENBERG (336) [phone]  THANE C. HISAW, DMD, PA THANE  HISAW [phone]  ELSPETH LITTIE FENTON, DDS, PA SONA  ISHARANI [phone]  DONALD PARAS [facility] DDS MS PA [facility] JR [phone]    [facility] [phone]  [email or web address] Provides Affordable Dentures and related tooth extraction services. Located in [place] and opened in 1978. Treats patients who travel from [place], [place] and many other communities in the surrounding areas.  Donated Dental Services [phone] Toll Free [phone] Local [email or web address] Provides donated dental care to those who are disabled,  medically compromised, or elderly (65+) and who have no financial resources with which to pay for their extensive dental care needs. No cost to approved applicants. People in a position to pay for part of their care may be encouraged to contribute, especially when lab work is involved. Call for application. Complete application form, sign, and mail to DDS. Once application is received, it is processed and the applicant is placed on a waiting list. When a dentist is in applicant's area is available, applicant will be contacted directly to complete intake interview and confirm eligibility. Must be permanently disabled, critically ill, or elderly (65+) and have no financial resources to pay. DDS does NOT cover routine or emergency care.  [facility] [employer] [phone] Information and Appointments [phone] Erik Balm Information Desk Dental Education Clinic in [place] that is open to the public. Students in the [employer] and Dental Assisting  programs are trained in various procedures by working with actual patients. Clinic is open during spring and fall semesters, not during the summers. February - June ONLY: clinic provides additional services, including complete exams, fillings, extractions, root canals, crowns, and temporary partial fillings. Insurance NOT accepted. Basic Services: $5 - $40. Surgical extractions, crowns, root canals range: $60 - $450. Cash or Checks ONLY; no credit cards. Payment required on day of service; no payment plans available. Appointment REQUIRED. Initial appointment is for screening medical and dental history. Second appointment (on a different day) will be made for treatment. [email or web address]  [facility] [phone] Information and Appointments [facility] [address]  [place], [place] [address] Services offered include Oral prophylaxis (teeth cleaning), Pit and fissure sealants, Dental x-rays, Fluoride treatments, Plaque control instruction Appointment times vary semester-to-semester and generally last 3 hours. Both clinics are open to [facility] students, faculty/staff and the general public. Rates for services offered by the clinics are nominal.  [place]  [facility]) [phone] Operates portable free dental clinics through [place]  as an Administrator of the [employer]. Provides free dental care to as many underserved persons within   as possible. To see upcoming clinic dates:  Visit: [email or web address]  [facility] [phone] General Clinic Information [phone] Patient Admissions Dental care and treatment provided by students enrolled in the Doctorate in Dental Surgery program (D.D.S.) and dental hygiene and dental assisting students. Services provided in all area of dental care including: fillings, crowns and bridges, complete and partial dentures, implants, gum treatments, root canals, extractions, as well as preventive care. Fees vary depending on treatment - usually 1/3 to 2/3 the cost treatment dentist offices. Dentures usually less than 1/3 the normal cost. A monthly drawing is held at the beginning of each month. If your application is selected, it will be reviewed and placed on a waiting list for a screening appointment. Call and have application mailed to youGROUP/FACILITY NAME  There are several Eye Doctors in your area. Here are a few that usually accept all insurance types:  [facility] [address] [place], [place] [address] Phone: [phone]  [facility] [address] [place], [place] [address] Phone: [phone]  [facility] [address] [place], [place] [address] Phone: [phone]  [facility] [address] [place], [place] [address] Phone: [phone]  [facility] [address] [place], [place] [address] Phone: [phone]  Please let us  know if you require a referral for an eye exam appointment. Thank you!  Follow up Visits: We will see or speak with you next year for your Next Medicare AWV with our clinical staff  Clinician Recommendations:  Aim for 30 minutes of exercise or brisk walking, 6-8 glasses of water , and 5 servings of fruits and vegetables each day.    Wishing you many blessings and good health during the next year until our next visit.  -Dorathy Stallone   This is a list of the screenings recommended for you:  Health Maintenance  Topic Date Due   COVID-19 Vaccine (6 - 2024-25 season) 02/21/2024*   Flu Shot  09/28/2024*   Eye exam for diabetics  02/19/2024   Complete foot exam   04/14/2024   Hemoglobin A1C  06/16/2024   Yearly kidney function blood test for diabetes  12/15/2024   Yearly kidney health urinalysis for diabetes  12/15/2024  Medicare Annual Wellness Visit  02/12/2025   DEXA scan (bone density measurement)  04/17/2025   DTaP/Tdap/Td vaccine (2 - Td or Tdap) 12/11/2028   Pneumococcal Vaccine for age over 20  Completed   Zoster (Shingles) Vaccine  Completed   HPV Vaccine  Aged Out   Meningitis B Vaccine  Aged Out   Pneumococcal Vaccine  Discontinued   Colon Cancer Screening  Discontinued   Hepatitis C Screening  Discontinued  *Topic was postponed. The date shown is not the original due date.    Advanced directives: (Provided) Advance directive discussed with you today. I have provided a copy for you to complete at home and have notarized. Once this is complete, please bring a copy in to our office so we can scan it into your chart.  Advance Care Planning is important because it:  [x]  Makes sure you receive the medical care that is consistent with your values, goals, and preferences  [x]  It provides guidance to  your family and loved ones and reduces their decisional burden about whether or not they are making the right decisions based on your wishes.  Follow the link provided in your after visit summary or read over the paperwork we have mailed to you to help you started getting your Advance Directives in place. If you need assistance in completing these, please reach out to us  so that we can help you!  See attachments for Preventive Care and Fall Prevention Tips.

## 2024-02-13 NOTE — Progress Notes (Signed)
 Please attest and cosign this visit due to patients primary care provider not being in the office at the time the visit was completed.   Subjective:   Alexandria Irwin is a 78 y.o. who presents for a Medicare Wellness preventive visit.  As a reminder, Annual Wellness Visits don't include a physical exam, and some assessments may be limited, especially if this visit is performed virtually. We may recommend an in-person follow-up visit with your provider if needed.  Visit Complete: Virtual I connected with  Zayah B Schriner on 02/13/24 by a audio enabled telemedicine application and verified that I am speaking with the correct person using two identifiers.  Patient Location: Home  Provider Location: Office/Clinic  I discussed the limitations of evaluation and management by telemedicine. The patient expressed understanding and agreed to proceed.  Vital Signs: Because this visit was a virtual/telehealth visit, some criteria may be missing or patient reported. Any vitals not documented were not able to be obtained and vitals that have been documented are patient reported.  VideoDeclined- This patient declined Librarian, academic. Therefore the visit was completed with audio only.  Persons Participating in Visit: Patient.  AWV Questionnaire: No: Patient Medicare AWV questionnaire was not completed prior to this visit.  Cardiac Risk Factors include: advanced age (>48men, >57 women);diabetes mellitus;dyslipidemia;sedentary lifestyle;hypertension     Objective:    Today's Vitals   02/13/24 0807  BP: 113/70  Weight: 146 lb (66.2 kg)  Height: 5' 1 (1.549 m)   Body mass index is 27.59 kg/m.     02/13/2024    8:07 AM 02/12/2023    3:07 PM 11/11/2022   11:00 AM 01/31/2022    8:21 AM 11/12/2021   10:15 AM 01/30/2021    9:10 AM 11/09/2020   10:37 AM  Advanced Directives  Does Patient Have a Medical Advance Directive? No No Unable to assess, patient is non-responsive or  altered mental status No No No No  Would patient like information on creating a medical advance directive? Yes (MAU/Ambulatory/Procedural Areas - Information given) Yes (MAU/Ambulatory/Procedural Areas - Information given)  No - Patient declined No - Patient declined No - Patient declined No - Patient declined    Current Medications (verified) Outpatient Encounter Medications as of 02/13/2024  Medication Sig   amLODipine  (NORVASC ) 5 MG tablet Take 1 tablet (5 mg total) by mouth daily.   Ascorbic Acid (VITAMIN C) 100 MG CHEW Chew 100 mg by mouth daily.   aspirin  EC 81 MG tablet Take 81 mg by mouth daily.   blood glucose meter kit and supplies Dispense based on patient and insurance preference. Use up to four times daily as directed. (FOR ICD-10 E10.9, E11.9).   Cholecalciferol 50 MCG (2000 UT) TABS    dapagliflozin  propanediol (FARXIGA ) 10 MG TABS tablet Take 1 tablet (10 mg total) by mouth daily before breakfast.   escitalopram  (LEXAPRO ) 10 MG tablet Take 1 tablet (10 mg total) by mouth daily.   fluconazole  (DIFLUCAN ) 150 MG tablet Take 1 tablet (150 mg total) by mouth every three (3) days as needed.   furosemide  (LASIX ) 20 MG tablet Take 0.5 tablets (10 mg total) by mouth daily.   glimepiride  (AMARYL ) 4 MG tablet TAKE 1 TABLET BY MOUTH EVERY DAY WITH BREAKFAST   glucose blood (ONETOUCH VERIO) test strip USE TO TEST BLOOD SUGAR DAILY   Lancets (ONETOUCH DELICA PLUS LANCET33G) MISC USE TO CHECK BLOOD SUGAR DAILY. DX E11.9   levothyroxine  (SYNTHROID ) 75 MCG tablet Take 1 tablet (  75 mcg total) by mouth daily before breakfast.   losartan  (COZAAR ) 100 MG tablet Take 1 tablet by mouth daily.   meclizine  (ANTIVERT ) 25 MG tablet Take 1 tablet (25 mg total) by mouth 3 (three) times daily as needed for dizziness.   metFORMIN  (GLUCOPHAGE -XR) 500 MG 24 hr tablet Take 1 tablet (500 mg total) by mouth 2 (two) times daily with a meal.   metoprolol  succinate (TOPROL -XL) 50 MG 24 hr tablet Take 1 tablet (50 mg  total) by mouth in the morning and at bedtime. Take with or immediately following a meal.   simvastatin  (ZOCOR ) 40 MG tablet Take 1 tablet (40 mg total) by mouth daily at 6 PM.   [DISCONTINUED] metoprolol  (LOPRESSOR ) 50 MG tablet TAKE 1 TABLET (50 MG TOTAL) BY MOUTH 2 (TWO) TIMES DAILY.   No facility-administered encounter medications on file as of 02/13/2024.    Allergies (verified) Neomycin -bacitracin zn-polymyx, Ace inhibitors, Ciprocin-fluocin-procin [fluocinolone], Norvasc  [amlodipine ], Latex, and Sulfa  antibiotics   History: Past Medical History:  Diagnosis Date   Cataract    Endometrial adenocarcinoma (HCC)    Endometrial adenocarcinoma (HCC) 06/25/2017   Endometrial cancer, FIGO stage IIIA (HCC) 08/12/2017   Environmental and seasonal allergies    Fall 07/2019   Family history of ovarian cancer    Family history of pancreatic cancer    Fx anat neck humerus-open 07/2019   Genetic testing 09/19/2017   Multi-Cancer panel (83 genes) @ [facility] - No pathogenic mutations detected   GERD (gastroesophageal reflux disease)    History of DVT of lower extremity    early 2000s--- lower right leg   History of radiation therapy 09/18/2017-10/13/2017   vaginal brachytherapy (HDR) - endometrial      Dr Lynwood Nasuti   Hyperlipidemia    Hypertension    Hypothyroidism    Type 2 diabetes mellitus (HCC)    followed by pcp   Umbilical hernia    Vitamin B 12 deficiency    Vitamin D  deficiency    Wears glasses    Past Surgical History:  Procedure Laterality Date   BREAST BIOPSY Left 2009   benign   CATARACT EXTRACTION W/ INTRAOCULAR LENS IMPLANT Left 2018   CATARACT EXTRACTION W/PHACO Right 03/14/2014   Procedure: CATARACT EXTRACTION PHACO AND INTRAOCULAR LENS PLACEMENT (IOC);  Surgeon: Cherene Mania, MD;  Location: [facility];  Service: Ophthalmology;  Laterality: Right;  CDE: 8.34   COLONOSCOPY WITH PROPOFOL  N/A 08/11/2020   Procedure: COLONOSCOPY WITH PROPOFOL ;  Surgeon: Eartha Angelia Sieving, MD;  Location: [facility];  Service: Gastroenterology;  Laterality: N/A;  7:30   FOOT GANGLION EXCISION Right 2013   HYSTEROSCOPY WITH D & C N/A 06/13/2017   Procedure: DILATATION AND CURETTAGE /HYSTEROSCOPY;  Surgeon: Debrah Ade, MD;  Location: [facility];  Service: Gynecology;  Laterality: N/A;   IR FLUORO GUIDE PORT INSERTION RIGHT  08/27/2017   IR REMOVAL TUN ACCESS W/ PORT W/O FL MOD SED  04/27/2018   IR US  GUIDE VASC ACCESS RIGHT  08/27/2017   LAPAROSCOPIC CHOLECYSTECTOMY  2005   POLYPECTOMY  08/11/2020   Procedure: POLYPECTOMY INTESTINAL;  Surgeon: Eartha Angelia Sieving, MD;  Location: [facility];  Service: Gastroenterology;;   ROBOTIC ASSISTED TOTAL HYSTERECTOMY WITH BILATERAL SALPINGO OOPHERECTOMY Bilateral 08/05/2017   Procedure: ROBOTIC ASSISTED TOTAL HYSTERECTOMY WITH BILATERAL SALPINGO OOPHORECTOMY;  Surgeon: Eloy Herring, MD;  Location: [facility];  Service: Gynecology;  Laterality: Bilateral;   SENTINEL NODE BIOPSY Bilateral 08/05/2017   Procedure: SENTINEL NODE BIOPSY;  Surgeon: Eloy Herring,  MD;  Location: [facility];  Service: Gynecology;  Laterality: Bilateral;   UMBILICAL HERNIA REPAIR N/A 08/05/2017   Procedure: HERNIA REPAIR UMBILICAL ADULT;  Surgeon: Eloy Herring, MD;  Location: [facility];  Service: Gynecology;  Laterality: N/A;   VEIN SURGERY Right 2011   vein stripping-leg  and laser treatment   Family History  Problem Relation Age of Onset   Pancreatic cancer Mother 53       deceased 2   Ovarian cancer Mother        dx 36s   Cancer Other 15       maternal half-sister; unk. type   Social History   Socioeconomic History   Marital status: Widowed    Spouse name: Garen   Number of children: 4   Years of education: Not on file   Highest education level: 12th grade  Occupational History   Occupation: retired  Tobacco Use   Smoking status: Never   Smokeless tobacco: Never  Vaping Use   Vaping status: Never Used  Substance and Sexual Activity   Alcohol use:  No   Drug use: No   Sexual activity: Yes    Birth control/protection: Post-menopausal  Other Topics Concern   Not on file  Social History Narrative   Her husband passed 05/2020 - her daughter, Olam lives with her   Social Drivers of Health   Financial Resource Strain: Medium Risk (02/13/2024)   Overall Financial Resource Strain (CARDIA)    Difficulty of Paying Living Expenses: Somewhat hard  Food Insecurity: No Food Insecurity (02/13/2024)   Hunger Vital Sign    Worried About Running Out of Food in the Last Year: Never true    Ran Out of Food in the Last Year: Never true  Transportation Needs: No Transportation Needs (02/13/2024)   PRAPARE - Administrator, Civil Service (Medical): No    Lack of Transportation (Non-Medical): No  Physical Activity: Inactive (02/13/2024)   Exercise Vital Sign    Days of Exercise per Week: 0 days    Minutes of Exercise per Session: 0 min  Stress: Stress Concern Present (02/13/2024)   [employer] of Occupational Health - Occupational Stress Questionnaire    Feeling of Stress: To some extent  Social Connections: Socially Isolated (02/13/2024)   Social Connection and Isolation Panel    Frequency of Communication with Friends and Family: More than three times a week    Frequency of Social Gatherings with Friends and Family: More than three times a week    Attends Religious Services: Never    Database administrator or Organizations: No    Attends Banker Meetings: Never    Marital Status: Widowed    Tobacco Counseling Counseling given: Yes    Clinical Intake:  Pre-visit preparation completed: Yes  Pain : No/denies pain     BMI - recorded: 27.59 Nutritional Status: BMI 25 -29 Overweight Diabetes: Yes CBG done?: No (telehealth. unable to obtain cbg) Did pt. bring in CBG monitor from home?: No  Lab Results  Component Value Date   HGBA1C 7.5 (H) 12/16/2023   HGBA1C 7.1 (H) 08/18/2023   HGBA1C 6.5 (H)  04/15/2023     How often do you need to have someone help you when you read instructions, pamphlets, or other written materials from your doctor or pharmacy?: 1 - Never  Interpreter Needed?: No  Information entered by :: [facility]   Activities of Daily Living     02/13/2024  8:21 AM  In your present state of health, do you have any difficulty performing the following activities:  Hearing? 1  Vision? 1  Difficulty concentrating or making decisions? 0  Walking or climbing stairs? 0  Dressing or bathing? 0  Doing errands, shopping? 0  Preparing Food and eating ? N  Using the Toilet? N  In the past six months, have you accidently leaked urine? N  Do you have problems with loss of bowel control? N  Managing your Medications? N  Managing your Finances? N  Housekeeping or managing your Housekeeping? N    Patient Care Team: Lavell Bari LABOR, FNP as PCP - General (Family Medicine) Billee Mliss BIRCH, [facility] as Pharmacist (Family Medicine) Ladora Ross Lacy Phebe, MD as Referring Physician (Optometry)  I have updated your Care Teams any recent Medical Services you may have received from other providers in the past year.     Assessment:   This is a routine wellness examination for [facility]  Hearing/Vision screen Hearing Screening - Comments:: Patient states she does have a little difficulty hearing but doesn't wear hearing aids. Has to ask people to repeat themselves often.  Referral placed.  Vision Screening - Comments:: Patient does not have an eye doctor. Resources provided.     Goals Addressed             This Visit's Progress    DIET - EAT MORE FRUITS AND VEGETABLES   On track    Exercise 3x per week (30 min per time)   On track    Try to exercise for at least 30 minutes, 3 times weekly     Patient Stated   On track    01/28/2020 AWV Goal: Exercise for General Health  Patient will verbalize understanding of the benefits of increased physical activity: Exercising  regularly is important. It will improve your overall fitness, flexibility, and endurance. Regular exercise also will improve your overall health. It can help you control your weight, reduce stress, and improve your bone density. Over the next year, patient will increase physical activity as tolerated with a goal of at least 150 minutes of moderate physical activity per week.  You can tell that you are exercising at a moderate intensity if your heart starts beating faster and you start breathing faster but can still hold a conversation. Moderate-intensity exercise ideas include: Walking 1 mile (1.6 km) in about 15 minutes Biking Hiking Golfing Dancing Water  aerobics Patient will verbalize understanding of everyday activities that increase physical activity by providing examples like the following: Yard work, such as: Insurance underwriter Gardening Washing windows or floors Patient will be able to explain general safety guidelines for exercising:  Before you start a new exercise program, talk with your health care provider. Do not exercise so much that you hurt yourself, feel dizzy, or get very short of breath. Wear comfortable clothes and wear shoes with good support. Drink plenty of water  while you exercise to prevent dehydration or heat stroke. Work out until your breathing and your heartbeat get faster.        Depression Screen     02/13/2024    8:26 AM 02/05/2024   10:52 AM 12/16/2023    9:28 AM 11/26/2023   11:28 AM 04/15/2023    9:12 AM 02/12/2023    3:06 PM 01/13/2023    9:31 AM  PHQ 2/9 Scores  PHQ - 2 Score 0 0  0 0 0 0 0  PHQ- 9 Score 0 0 6  0      Fall Risk     02/13/2024    8:13 AM 02/05/2024   10:52 AM 12/16/2023    9:19 AM 04/15/2023    9:12 AM 02/12/2023    3:05 PM  Fall Risk   Falls in the past year? 1 1 1  0 0  Number falls in past yr: 0 0 0 0 0  Injury with Fall? 0 0 0 0 0  Risk for  fall due to : History of fall(s);Impaired balance/gait;Impaired mobility Impaired balance/gait;History of fall(s) History of fall(s);Impaired balance/gait No Fall Risks No Fall Risks  Follow up Falls evaluation completed;Education provided;Falls prevention discussed Falls evaluation completed Falls evaluation completed  Falls prevention discussed    MEDICARE RISK AT HOME:  Medicare Risk at Home Any stairs in or around the home?: Yes If so, are there any without handrails?: No Home free of loose throw rugs in walkways, pet beds, electrical cords, etc?: Yes Adequate lighting in your home to reduce risk of falls?: Yes Life alert?: No Use of a cane, walker or w/c?: No Grab bars in the bathroom?: Yes Shower chair or bench in shower?: No Elevated toilet seat or a handicapped toilet?: Yes  TIMED UP AND GO:  Was the test performed?  No  Cognitive Function: 6CIT completed        02/13/2024    8:22 AM 02/12/2023    3:08 PM 01/31/2022    8:26 AM 01/30/2021    9:14 AM 01/28/2020    8:37 AM  6CIT Screen  What Year? 0 points 0 points 0 points 0 points 0 points  What month? 0 points 0 points 0 points 0 points 0 points  What time? 0 points 0 points 0 points 0 points 0 points  Count back from 20 0 points 0 points 0 points 0 points 0 points  Months in reverse 0 points 0 points 0 points 0 points 0 points  Repeat phrase 0 points 0 points 0 points 0 points 0 points  Total Score 0 points 0 points 0 points 0 points 0 points    Immunizations Immunization History  Administered Date(s) Administered   Fluad Quad(high Dose 65+) 04/30/2021, 05/10/2022   Fluad Trivalent(High Dose 65+) 04/15/2023   Influenza Whole 08/02/2011   Influenza,inj,Quad PF,6+ Mos 05/04/2013, 04/23/2014, 04/17/2015   Influenza-Unspecified 01/30/2016, 04/20/2019   Moderna Covid-19 Vaccine Bivalent Booster 48yrs & up 05/05/2021   PFIZER(Purple Top)SARS-COV-2 Vaccination 08/07/2019, 09/02/2019, 05/12/2020, 10/29/2020   Pneumococcal  Conjugate-13 04/22/2017   Pneumococcal Polysaccharide-23 06/01/2018   Tdap 12/12/2018   Zoster Recombinant(Shingrix ) 11/06/2021, 02/07/2022    Screening Tests Health Maintenance  Topic Date Due   COVID-19 Vaccine (6 - 2024-25 season) 02/21/2024 (Originally 03/02/2023)   INFLUENZA VACCINE  09/28/2024 (Originally 01/30/2024)   OPHTHALMOLOGY EXAM  02/19/2024   FOOT EXAM  04/14/2024   HEMOGLOBIN A1C  06/16/2024   Diabetic kidney evaluation - eGFR measurement  12/15/2024   Diabetic kidney evaluation - Urine ACR  12/15/2024   Medicare Annual Wellness (AWV)  02/12/2025   DEXA SCAN  04/17/2025   DTaP/Tdap/Td (2 - Td or Tdap) 12/11/2028   Pneumococcal Vaccine: 50+ Years  Completed   Zoster Vaccines- Shingrix   Completed   HPV VACCINES  Aged Out   Meningococcal B Vaccine  Aged Out   Pneumococcal Vaccine  Discontinued   Colonoscopy  Discontinued   Hepatitis C Screening  Discontinued    Health Maintenance  There are no preventive care reminders to display for this patient. Health Maintenance Items Addressed: Routine health screenings are up to date. Patient is aware of recommended vaccines.   Additional Screening:  Vision Screening: Recommended annual ophthalmology exams for early detection of glaucoma and other disorders of the eye. Would you like a referral to an eye doctor? Patient provided with resources to find an eye doctor.    Dental Screening: Recommended annual dental exams for proper oral hygiene  Community Resource Referral / Chronic Care Management: CRR required this visit?  No   CCM required this visit?  No   Plan:    I have personally reviewed and noted the following in the patient's chart:   Medical and social history Use of alcohol, tobacco or illicit drugs  Current medications and supplements including opioid prescriptions. Patient is not currently taking opioid prescriptions. Functional ability and status Nutritional status Physical activity Advanced  directives List of other physicians Hospitalizations, surgeries, and ER visits in previous 12 months Vitals Screenings to include cognitive, depression, and falls Referrals and appointments  In addition, I have reviewed and discussed with patient certain preventive protocols, quality metrics, and best practice recommendations. A written personalized care plan for preventive services as well as general preventive health recommendations were provided to patient.   Rudean Icenhour, CMA   02/13/2024   After Visit Summary: (MyChart) Due to this being a telephonic visit, the after visit summary with patients personalized plan was offered to patient via MyChart   Notes: Nothing significant to report at this time.

## 2024-02-25 ENCOUNTER — Other Ambulatory Visit: Payer: Self-pay | Admitting: Family

## 2024-02-25 DIAGNOSIS — R42 Dizziness and giddiness: Secondary | ICD-10-CM

## 2024-02-26 ENCOUNTER — Other Ambulatory Visit: Payer: Self-pay | Admitting: Family

## 2024-02-26 DIAGNOSIS — B3731 Acute candidiasis of vulva and vagina: Secondary | ICD-10-CM

## 2024-02-26 DIAGNOSIS — E1169 Type 2 diabetes mellitus with other specified complication: Secondary | ICD-10-CM

## 2024-02-27 ENCOUNTER — Telehealth: Payer: Self-pay | Admitting: Family Medicine

## 2024-02-27 ENCOUNTER — Other Ambulatory Visit: Payer: Self-pay

## 2024-02-27 DIAGNOSIS — E119 Type 2 diabetes mellitus without complications: Secondary | ICD-10-CM

## 2024-02-27 MED ORDER — ONETOUCH DELICA PLUS LANCET33G MISC: 3 refills | Status: DC

## 2024-02-27 NOTE — Telephone Encounter (Signed)
 Needles sent to pharmacy. Left detailed message on patients voicemail that this was done

## 2024-02-27 NOTE — Telephone Encounter (Signed)
 Copied from CRM [id number]. Topic: Clinical - Prescription Issue >> Feb 26, 2024  5:51 PM DeAngela L wrote: Reason for CRM: patient calling cause after picked up her prescription from the pharmacy today, there was only strips and no needles and she needed needles and strips refilled, and the patient is asking if the office could send a prescription for the needles to the pharmacy as soon as possible  Patient states she is going out of town and would like to ask if the office could send the refill for the needles to the pharmacy as soon as possible   Pt num [phone] (H)   cellphone [phone]   [id number] - [place], [place] - [address] [place] [address] Phone: [phone] Fax: [phone]

## 2024-03-12 NOTE — Progress Notes (Signed)
 Pharmacy Medication Assistance Program Note    03/12/2024  Patient ID: Alexandria Irwin, female   DOB: [birth year], 78 y.o.   MRN: [id number]     09/12/2023  Outreach Medication One  Manufacturer Medication One [employer]  [employer] Drugs Farxiga   Dose of Farxiga  10MG   Type of Radiographer, therapeutic Assistance  Date Application Submitted to Manufacturer 09/16/2023  Method Application Sent to Manufacturer Fax  Patient Assistance Determination Approved  Approval End Date 06/30/2024     APPROVED

## 2024-03-16 ENCOUNTER — Other Ambulatory Visit: Payer: Self-pay | Admitting: Family

## 2024-03-16 DIAGNOSIS — B3731 Acute candidiasis of vulva and vagina: Secondary | ICD-10-CM

## 2024-03-17 MED ORDER — FLUCONAZOLE 150 MG PO TABS: 150.0000 mg | ORAL_TABLET | ORAL | 0 refills | Status: DC | PRN

## 2024-03-17 NOTE — Telephone Encounter (Signed)
 Per hawks I got the verbal okay to send in

## 2024-03-19 ENCOUNTER — Ambulatory Visit: Payer: Self-pay

## 2024-03-19 ENCOUNTER — Telehealth: Payer: Self-pay

## 2024-03-19 NOTE — Telephone Encounter (Signed)
 Copied from CRM [id number]. Topic: General - Other >> Mar 19, 2024 11:44 AM Rosaria BRAVO wrote: Reason for CRM: Pt called reporting that they received a message this morning on mychart but she has not yet read it.

## 2024-03-19 NOTE — Telephone Encounter (Signed)
 Called and spoke with patient appointment was made for high blood pressure

## 2024-03-19 NOTE — Telephone Encounter (Signed)
 FYI Only or Action Required?: Action required by provider: Please see time sensitive notes and request to call Randine, daughter in Social worker.  Patient was last seen in primary care on 02/05/2024 by Lavell Bari LABOR, FNP.  Called Nurse Triage reporting Hypertension.  Symptoms began several months ago.  Interventions attempted: Prescription medications: Amlodopine.  Symptoms are: unchanged.  Triage Disposition: See Physician Within 24 Hours  Patient/caregiver understands and will follow disposition?: Yes     Copied from CRM #[id number]. Topic: Clinical - Red Word Triage >> Mar 19, 2024  8:20 AM Miquel SAILOR wrote: Red Word that prompted transfer to Nurse Triage: Dizzyness/High blood pressure 160/83 180/110-falling issues since for 2-3 months but last week worse Reason for Disposition  Systolic BP >= 180 OR Diastolic >= 110  Answer Assessment - Initial Assessment Questions Randine, daughter in law, called in stating patient has been experiencing high blood pressure for a few months. Randine emphasizes it happens when patient gets nervous. She states patient has had a few falls over the last few months. Randine states she needs to express strongly that the patient refuses to see any other provider outside of [facility] NP and refuses to go to [facility] or ED. I advised Randine that Bari does not have appts available until October and patient needs sooner assessment based off of BP readings. Randine states she would like message sent to clinic and call back if patient can be seen by NP asap. She states it is very important to call her back, since patient is having memory issues. Tracy's phone # is [phone]. Randine noted they will be out of town from October 11-18. This RN informed Randine she is not showing on patient's DPR and it is important to make sure she is added at earliest opportunity.  1. BLOOD PRESSURE: What is your blood pressure? Did you take at least two measurements 5 minutes apart?     160/80 last  night 2. ONSET: When did you take your blood pressure?     Last night - BP has been running high for a few months off and on when she gets nervous.  3. HOW: How did you take your blood pressure? (e.g., automatic home BP monitor, visiting nurse)     Automatic cuff  4. HISTORY: Do you have a history of high blood pressure?     Yes  5. MEDICINES: Are you taking any medicines for blood pressure? Have you missed any doses recently?     Amlodopine 6. OTHER SYMPTOMS: Do you have any symptoms? (e.g., blurred vision, chest pain, difficulty breathing, headache, weakness)     Dizzy spells, anxiety,   Patient's daughter in law, Randine, states patient has had falls for a few months.  Protocols used: Blood Pressure - High-A-AH

## 2024-03-19 NOTE — Telephone Encounter (Signed)
 Called and made appt

## 2024-03-22 DIAGNOSIS — I872 Venous insufficiency (chronic) (peripheral): Secondary | ICD-10-CM | POA: Diagnosis not present

## 2024-03-22 DIAGNOSIS — L820 Inflamed seborrheic keratosis: Secondary | ICD-10-CM | POA: Diagnosis not present

## 2024-03-23 ENCOUNTER — Encounter: Payer: Self-pay | Admitting: Family

## 2024-03-23 ENCOUNTER — Ambulatory Visit (INDEPENDENT_AMBULATORY_CARE_PROVIDER_SITE_OTHER): Admitting: Family

## 2024-03-23 VITALS — BP 126/88 | HR 103 | Temp 97.6°F | Wt 144.0 lb

## 2024-03-23 DIAGNOSIS — E785 Hyperlipidemia, unspecified: Secondary | ICD-10-CM | POA: Diagnosis not present

## 2024-03-23 DIAGNOSIS — F02A0 Dementia in other diseases classified elsewhere, mild, without behavioral disturbance, psychotic disturbance, mood disturbance, and anxiety: Secondary | ICD-10-CM | POA: Diagnosis not present

## 2024-03-23 DIAGNOSIS — E1159 Type 2 diabetes mellitus with other circulatory complications: Secondary | ICD-10-CM | POA: Diagnosis not present

## 2024-03-23 DIAGNOSIS — K7460 Unspecified cirrhosis of liver: Secondary | ICD-10-CM

## 2024-03-23 DIAGNOSIS — E039 Hypothyroidism, unspecified: Secondary | ICD-10-CM | POA: Diagnosis not present

## 2024-03-23 DIAGNOSIS — R296 Repeated falls: Secondary | ICD-10-CM | POA: Diagnosis not present

## 2024-03-23 DIAGNOSIS — E663 Overweight: Secondary | ICD-10-CM

## 2024-03-23 DIAGNOSIS — E538 Deficiency of other specified B group vitamins: Secondary | ICD-10-CM | POA: Diagnosis not present

## 2024-03-23 DIAGNOSIS — R42 Dizziness and giddiness: Secondary | ICD-10-CM

## 2024-03-23 DIAGNOSIS — I152 Hypertension secondary to endocrine disorders: Secondary | ICD-10-CM

## 2024-03-23 DIAGNOSIS — Z23 Encounter for immunization: Secondary | ICD-10-CM

## 2024-03-23 DIAGNOSIS — F411 Generalized anxiety disorder: Secondary | ICD-10-CM | POA: Diagnosis not present

## 2024-03-23 DIAGNOSIS — G300 Alzheimer's disease with early onset: Secondary | ICD-10-CM

## 2024-03-23 DIAGNOSIS — E1169 Type 2 diabetes mellitus with other specified complication: Secondary | ICD-10-CM | POA: Diagnosis not present

## 2024-03-23 MED ORDER — ESCITALOPRAM OXALATE 20 MG PO TABS: 20.0000 mg | ORAL_TABLET | Freq: Every day | ORAL | 1 refills | Status: DC

## 2024-03-23 MED ORDER — DONEPEZIL HCL 5 MG PO TABS: 5.0000 mg | ORAL_TABLET | Freq: Every day | ORAL | 1 refills | Status: DC

## 2024-03-23 MED ORDER — MEMANTINE HCL 5 MG PO TABS: 5.0000 mg | ORAL_TABLET | Freq: Two times a day (BID) | ORAL | 1 refills | Status: DC

## 2024-03-23 NOTE — Progress Notes (Signed)
 Subjective:    Patient ID: Alexandria Irwin, female    DOB: 1945-09-22, 78 y.o.   MRN: [id number]  Chief Complaint  Patient presents with   Hypertension   Anxiety   Dizziness    Hear people talking    Pt presents to the office today for chronic follow up. She has multiple complaints today.   Her family is with her today. States she has had increased anxiety. With her increased anxiety she will have increased BP of 180/11. Her BP today is at goal.   Family states she is having a hard time remembering things. She will forget her sons names a times.   She reports she has fallen >10 times over the last year. Reports weakness and dizziness.  Pt has hx Endometrial Adenocarcinoma and followed by Oncologists as needed. She has completed chemo and radiation. 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   She is followed by GI as needed for cirrhosis.    She is followed by Dermatologists for basal skin cancer.  Hypertension This is a chronic problem. The current episode started more than 1 year ago. The problem has been resolved since onset. The problem is controlled. Associated symptoms include peripheral edema (some times). Pertinent negatives include no shortness of breath. Risk factors for coronary artery disease include dyslipidemia and diabetes mellitus. The current treatment provides moderate improvement. Identifiable causes of hypertension include a thyroid  problem.  Thyroid  Problem Presents for follow-up visit. Symptoms include anxiety and dry skin. The symptoms have been stable.  Hyperlipidemia This is a chronic problem. The current episode started more than 1 year ago. The problem is controlled. Recent lipid tests were reviewed and are normal. Exacerbating diseases include obesity. She has no history of nephrotic syndrome. Pertinent negatives include no shortness of breath. Current antihyperlipidemic treatment includes statins. The current treatment provides moderate  improvement of lipids. Risk factors for coronary artery disease include dyslipidemia, diabetes mellitus, hypertension, a sedentary lifestyle and post-menopausal.  Diabetes She presents for her follow-up diabetic visit. She has type 2 diabetes mellitus. Hypoglycemia symptoms include dizziness and nervousness/anxiousness. Risk factors for coronary artery disease include dyslipidemia, diabetes mellitus, hypertension, sedentary lifestyle and post-menopausal. She is following a generally unhealthy diet. Her overall blood glucose range is 140-180 mg/dl. Eye exam is current.  Anxiety Presents for follow-up visit. Symptoms include dizziness, excessive worry, nervous/anxious behavior and restlessness. Patient reports no shortness of breath or suicidal ideas. Symptoms occur most days.    Dizziness This is a new problem. The current episode started 1 to 4 weeks ago. She has tried position changes for the symptoms. The treatment provided mild relief.      Review of Systems  Respiratory:  Negative for shortness of breath.   Neurological:  Positive for dizziness.  Psychiatric/Behavioral:  Negative for suicidal ideas. The patient is nervous/anxious.   All other systems reviewed and are negative.  Family History  Problem Relation Age of Onset   Pancreatic cancer Mother 48       deceased 24   Ovarian cancer Mother        dx 79s   Cancer Other 9       maternal half-sister; unk. type   Social History   Socioeconomic History   Marital status: Widowed    Spouse name: Garen   Number of children: 4   Years of education: Not on file   Highest education level: 12th grade  Occupational History   Occupation: retired  Tobacco Use  Smoking status: Never   Smokeless tobacco: Never  Vaping Use   Vaping status: Never Used  Substance and Sexual Activity   Alcohol use: No   Drug use: No   Sexual activity: Yes    Birth control/protection: Post-menopausal  Other Topics Concern   Not on file  Social  History Narrative   Her husband passed 05/2020 - her daughter, Olam lives with her   Social Drivers of Health   Financial Resource Strain: Medium Risk (02/13/2024)   Overall Financial Resource Strain (CARDIA)    Difficulty of Paying Living Expenses: Somewhat hard  Food Insecurity: No Food Insecurity (02/13/2024)   Hunger Vital Sign    Worried About Running Out of Food in the Last Year: Never true    Ran Out of Food in the Last Year: Never true  Transportation Needs: No Transportation Needs (02/13/2024)   PRAPARE - Administrator, Civil Service (Medical): No    Lack of Transportation (Non-Medical): No  Physical Activity: Inactive (02/13/2024)   Exercise Vital Sign    Days of Exercise per Week: 0 days    Minutes of Exercise per Session: 0 min  Stress: Stress Concern Present (02/13/2024)   [employer] of Occupational Health - Occupational Stress Questionnaire    Feeling of Stress: To some extent  Social Connections: Socially Isolated (02/13/2024)   Social Connection and Isolation Panel    Frequency of Communication with Friends and Family: More than three times a week    Frequency of Social Gatherings with Friends and Family: More than three times a week    Attends Religious Services: Never    Database administrator or Organizations: No    Attends Banker Meetings: Never    Marital Status: Widowed        Objective:   Physical Exam Vitals reviewed.  Constitutional:      General: She is not in acute distress.    Appearance: She is well-developed.  HENT:     Head: Normocephalic and atraumatic.     Right Ear: Tympanic membrane normal.     Left Ear: Tympanic membrane normal.  Eyes:     Pupils: Pupils are equal, round, and reactive to light.  Neck:     Thyroid : No thyromegaly.  Cardiovascular:     Rate and Rhythm: Regular rhythm.     Heart sounds: Normal heart sounds. No murmur heard. Pulmonary:     Effort: Pulmonary effort is normal. No respiratory  distress.     Breath sounds: Normal breath sounds. No wheezing.  Abdominal:     General: Bowel sounds are normal. There is no distension.     Palpations: Abdomen is soft.     Tenderness: There is no abdominal tenderness.  Musculoskeletal:        General: No tenderness. Normal range of motion.     Cervical back: Normal range of motion and neck supple.     Right lower leg: Edema (trace) present.     Left lower leg: Edema (trace) present.  Skin:    General: Skin is warm and dry.  Neurological:     Mental Status: She is alert and oriented to person, place, and time.     Cranial Nerves: No cranial nerve deficit.     Motor: Weakness (mild weakness) present.     Gait: Gait (normal gait) normal.     Deep Tendon Reflexes: Reflexes are normal and symmetric.  Psychiatric:        Mood and  Affect: Mood is anxious.        Behavior: Behavior normal.        Thought Content: Thought content normal.        Judgment: Judgment normal.       BP 126/88   Pulse (!) 103   Temp 97.6 F (36.4 C) (Temporal)   Wt 144 lb (65.3 kg)   SpO2 96%   BMI 27.21 kg/m      Assessment & Plan:  Alexandria Irwin comes in today with chief complaint of Hypertension, Anxiety, and Dizziness (Hear people talking )   Diagnosis and orders addressed:  1. Encounter for immunization - Flu vaccine HIGH DOSE PF(Fluzone  Trivalent)  2. Hypertension associated with diabetes (HCC) - CMP14+EGFR  3. Type 2 diabetes mellitus with other specified complication, without long-term current use of insulin  (HCC) (Primary) - Bayer DCA Hb A1c Waived - CMP14+EGFR  4. Hyperlipidemia associated with type 2 diabetes mellitus (HCC) - CMP14+EGFR  5. Hypothyroidism, unspecified type  - CMP14+EGFR - TSH  6. B12 deficiency - Anemia Profile B - CMP14+EGFR  7. Overweight (BMI 25.0-29.9)  - CMP14+EGFR  8. Cirrhosis of liver without ascites, unspecified hepatic cirrhosis type (HCC) - CMP14+EGFR  9. 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  Dispense: 180 tablet; Refill: 1 - donepezil  (ARICEPT ) 5 MG tablet; Take 1 tablet (5 mg total) by mouth at bedtime.  Dispense: 90 tablet; Refill: 1  10. Dizziness - CMP14+EGFR - EKG 12-Lead - Ambulatory referral to Home Health - CT HEAD WO CONTRAST ( ); Future  11. Frequent falls  - Ambulatory referral to Home Health - CT HEAD WO CONTRAST ( ); Future  12. GAD (generalized anxiety disorder) - escitalopram  (LEXAPRO ) 20 MG tablet; Take 1 tablet (20 mg total) by mouth daily.  Dispense: 90 tablet; Refill: 1  Labs pending Will increase Lexapro   to 20 mg from 10 mg  Will add Namenda  and Aricept  today. Discussed possible side effects. Will increase on next visit if tolerating well.  Referral to PT for exercises and fall preventions Fall preventions discussed in length CT head ordered today for frequent falls and dizziness Stress management  Continue current medications  Keep follow up with specialists  Health Maintenance reviewed Diet and exercise encouraged Approx 45 mins spent with patient, family, chart review, educations, and charting    Follow up plan: 1 month for dizziness, frequent falls, GAD, and memory issues   Bari Learn, FNP

## 2024-03-23 NOTE — Patient Instructions (Signed)
Fall Prevention in the Home, Adult Falls can cause injuries and affect people of all ages. There are many simple things that you can do to make your home safe and to help prevent falls. If you need it, ask for help making these changes. What actions can I take to prevent falls? General information Use good lighting in all rooms. Make sure to: Replace any light bulbs that burn out. Turn on lights if it is dark and use night-lights. Keep items that you use often in easy-to-reach places. Lower the shelves around your home if needed. Move furniture so that there are clear paths around it. Do not keep throw rugs or other things on the floor that can make you trip. If any of your floors are uneven, fix them. Add color or contrast paint or tape to clearly mark and help you see: Grab bars or handrails. First and last steps of staircases. Where the edge of each step is. If you use a ladder or stepladder: Make sure that it is fully opened. Do not climb a closed ladder. Make sure the sides of the ladder are locked in place. Have someone hold the ladder while you use it. Know where your pets are as you move through your home. What can I do in the bathroom?     Keep the floor dry. Clean up any water that is on the floor right away. Remove soap buildup in the bathtub or shower. Buildup makes bathtubs and showers slippery. Use non-skid mats or decals on the floor of the bathtub or shower. Attach bath mats securely with double-sided, non-slip rug tape. If you need to sit down while you are in the shower, use a non-slip stool. Install grab bars by the toilet and in the bathtub and shower. Do not use towel bars as grab bars. What can I do in the bedroom? Make sure that you have a light by your bed that is easy to reach. Do not use any sheets or blankets on your bed that hang to the floor. Have a firm bench or chair with side arms that you can use for support when you get dressed. What can I do in  the kitchen? Clean up any spills right away. If you need to reach something above you, use a sturdy step stool that has a grab bar. Keep electrical cables out of the way. Do not use floor polish or wax that makes floors slippery. What can I do with my stairs? Do not leave anything on the stairs. Make sure that you have a light switch at the top and the bottom of the stairs. Have them installed if you do not have them. Make sure that there are handrails on both sides of the stairs. Fix handrails that are broken or loose. Make sure that handrails are as long as the staircases. Install non-slip stair treads on all stairs in your home if they do not have carpet. Avoid having throw rugs at the top or bottom of stairs, or secure the rugs with carpet tape to prevent them from moving. Choose a carpet design that does not hide the edge of steps on the stairs. Make sure that carpet is firmly attached to the stairs. Fix any carpet that is loose or worn. What can I do on the outside of my home? Use bright outdoor lighting. Repair the edges of walkways and driveways and fix any cracks. Clear paths of anything that can make you trip, such as tools or rocks. Add   color or contrast paint or tape to clearly mark and help you see high doorway thresholds. Trim any bushes or trees on the main path into your home. Check that handrails are securely fastened and in good repair. Both sides of all steps should have handrails. Install guardrails along the edges of any raised decks or porches. Have leaves, snow, and ice cleared regularly. Use sand, salt, or ice melt on walkways during winter months if you live where there is ice and snow. In the garage, clean up any spills right away, including grease or oil spills. What other actions can I take? Review your medicines with your health care provider. Some medicines can make you confused or feel dizzy. This can increase your chance of falling. Wear closed-toe shoes that  fit well and support your feet. Wear shoes that have rubber soles and low heels. Use a cane, walker, scooter, or crutches that help you move around if needed. Talk with your provider about other ways that you can decrease your risk of falls. This may include seeing a physical therapist to learn to do exercises to improve movement and strength. Where to find more information Centers for Disease Control and Prevention, [employer]AD[employer]: cdc.gov National Institute on Aging: nia.nih.gov National Institute on Aging: nia.nih.gov Contact a health care provider if: You are afraid of falling at home. You feel weak, drowsy, or dizzy at home. You fall at home. Get help right away if you: Lose consciousness or have trouble moving after a fall. Have a fall that causes a head injury. These symptoms may be an emergency. Get help right away. Call 911. Do not wait to see if the symptoms will go away. Do not drive yourself to the hospital. This information is not intended to replace advice given to you by your health care provider. Make sure you discuss any questions you have with your health care provider. Document Revised: 02/18/2022 Document Reviewed: 02/18/2022 Elsevier Patient Education  2024 Elsevier Inc.  

## 2024-03-24 LAB — CMP14+EGFR
ALT: 23 IU/L (ref 0–32)
AST: 24 IU/L (ref 0–40)
Albumin: 4.6 g/dL (ref 3.8–4.8)
Alkaline Phosphatase: 40 IU/L — ABNORMAL LOW (ref 49–135)
BUN/Creatinine Ratio: 22 (ref 12–28)
BUN: 26 mg/dL (ref 8–27)
Bilirubin Total: 0.9 mg/dL (ref 0.0–1.2)
CO2: 22 mmol/L (ref 20–29)
Calcium: 11 mg/dL — ABNORMAL HIGH (ref 8.7–10.3)
Chloride: 102 mmol/L (ref 96–106)
Creatinine, Ser: 1.16 mg/dL — ABNORMAL HIGH (ref 0.57–1.00)
Globulin, Total: 2.5 g/dL (ref 1.5–4.5)
Glucose: 113 mg/dL — ABNORMAL HIGH (ref 70–99)
Potassium: 4.5 mmol/L (ref 3.5–5.2)
Sodium: 141 mmol/L (ref 134–144)
Total Protein: 7.1 g/dL (ref 6.0–8.5)
eGFR: 48 mL/min/1.73 — ABNORMAL LOW (ref >59)

## 2024-03-24 LAB — ANEMIA PROFILE B
Basophils Absolute: 0.1 x10E3/uL (ref 0.0–0.2)
Basos: 1 %
EOS (ABSOLUTE): 0 x10E3/uL (ref 0.0–0.4)
Eos: 1 %
Ferritin: 65 ng/mL (ref 15–150)
Folate: 14.7 ng/mL (ref >3.0)
Hematocrit: 44.9 % (ref 34.0–46.6)
Hemoglobin: 14.9 g/dL (ref 11.1–15.9)
Immature Grans (Abs): 0 x10E3/uL (ref 0.0–0.1)
Immature Granulocytes: 0 %
Iron Saturation: 25 % (ref 15–55)
Iron: 88 ug/dL (ref 27–139)
Lymphocytes Absolute: 3 x10E3/uL (ref 0.7–3.1)
Lymphs: 34 %
MCH: 32.8 pg (ref 26.6–33.0)
MCHC: 33.2 g/dL (ref 31.5–35.7)
MCV: 99 fL — ABNORMAL HIGH (ref 79–97)
Monocytes Absolute: 0.5 x10E3/uL (ref 0.1–0.9)
Monocytes: 6 %
Neutrophils Absolute: 5.2 x10E3/uL (ref 1.4–7.0)
Neutrophils: 58 %
Platelets: 214 x10E3/uL (ref 150–450)
RBC: 4.54 x10E6/uL (ref 3.77–5.28)
RDW: 12.4 % (ref 11.7–15.4)
Retic Ct Pct: 1.5 % (ref 0.6–2.6)
Total Iron Binding Capacity: 359 ug/dL (ref 250–450)
UIBC: 271 ug/dL (ref 118–369)
Vitamin B-12: 1730 pg/mL — ABNORMAL HIGH (ref 232–1245)
WBC: 8.9 x10E3/uL (ref 3.4–10.8)

## 2024-03-24 LAB — TSH: TSH: 1.79 u[IU]/mL (ref 0.450–4.500)

## 2024-03-24 LAB — BAYER DCA HB A1C WAIVED: HB A1C (BAYER DCA - WAIVED): 7 % — ABNORMAL HIGH (ref 4.8–5.6)

## 2024-03-25 ENCOUNTER — Ambulatory Visit: Payer: Self-pay | Admitting: Family

## 2024-03-28 ENCOUNTER — Other Ambulatory Visit: Payer: Self-pay | Admitting: Family

## 2024-03-28 DIAGNOSIS — I152 Hypertension secondary to endocrine disorders: Secondary | ICD-10-CM

## 2024-04-01 ENCOUNTER — Other Ambulatory Visit: Payer: Self-pay | Admitting: Family

## 2024-04-01 DIAGNOSIS — R296 Repeated falls: Secondary | ICD-10-CM

## 2024-04-01 DIAGNOSIS — R42 Dizziness and giddiness: Secondary | ICD-10-CM

## 2024-04-05 ENCOUNTER — Telehealth: Payer: Self-pay | Admitting: Family Medicine

## 2024-04-05 NOTE — Telephone Encounter (Signed)
 Copied from CRM [id number]. Topic: General - Other >> Apr 02, 2024  4:53 PM Lauren C wrote: Reason for CRM: Bernarda from [facility] is calling to request a delay of care. Referral received 10/1, but patient is requesting for services to begin 10/7. Contact information for [facility]: Ph# [phone]  F# [phone]

## 2024-04-05 NOTE — Telephone Encounter (Signed)
 Contacted [facility] and gave verbal ok for delay of care for patient

## 2024-04-06 DIAGNOSIS — Z7982 Long term (current) use of aspirin: Secondary | ICD-10-CM | POA: Diagnosis not present

## 2024-04-06 DIAGNOSIS — Z6827 Body mass index (BMI) 27.0-27.9, adult: Secondary | ICD-10-CM | POA: Diagnosis not present

## 2024-04-06 DIAGNOSIS — E1159 Type 2 diabetes mellitus with other circulatory complications: Secondary | ICD-10-CM | POA: Diagnosis not present

## 2024-04-06 DIAGNOSIS — F0284 Dementia in other diseases classified elsewhere, unspecified severity, with anxiety: Secondary | ICD-10-CM | POA: Diagnosis not present

## 2024-04-06 DIAGNOSIS — K7460 Unspecified cirrhosis of liver: Secondary | ICD-10-CM | POA: Diagnosis not present

## 2024-04-06 DIAGNOSIS — G620 Drug-induced polyneuropathy: Secondary | ICD-10-CM | POA: Diagnosis not present

## 2024-04-06 DIAGNOSIS — R296 Repeated falls: Secondary | ICD-10-CM | POA: Diagnosis not present

## 2024-04-06 DIAGNOSIS — G300 Alzheimer's disease with early onset: Secondary | ICD-10-CM | POA: Diagnosis not present

## 2024-04-06 DIAGNOSIS — E1169 Type 2 diabetes mellitus with other specified complication: Secondary | ICD-10-CM | POA: Diagnosis not present

## 2024-04-06 DIAGNOSIS — F411 Generalized anxiety disorder: Secondary | ICD-10-CM | POA: Diagnosis not present

## 2024-04-06 DIAGNOSIS — Z7984 Long term (current) use of oral hypoglycemic drugs: Secondary | ICD-10-CM | POA: Diagnosis not present

## 2024-04-06 DIAGNOSIS — E039 Hypothyroidism, unspecified: Secondary | ICD-10-CM | POA: Diagnosis not present

## 2024-04-06 DIAGNOSIS — C4491 Basal cell carcinoma of skin, unspecified: Secondary | ICD-10-CM | POA: Diagnosis not present

## 2024-04-06 DIAGNOSIS — I152 Hypertension secondary to endocrine disorders: Secondary | ICD-10-CM | POA: Diagnosis not present

## 2024-04-06 DIAGNOSIS — E663 Overweight: Secondary | ICD-10-CM | POA: Diagnosis not present

## 2024-04-06 DIAGNOSIS — D519 Vitamin B12 deficiency anemia, unspecified: Secondary | ICD-10-CM | POA: Diagnosis not present

## 2024-04-06 DIAGNOSIS — Z9181 History of falling: Secondary | ICD-10-CM | POA: Diagnosis not present

## 2024-04-06 DIAGNOSIS — Z556 Problems related to health literacy: Secondary | ICD-10-CM | POA: Diagnosis not present

## 2024-04-06 DIAGNOSIS — E785 Hyperlipidemia, unspecified: Secondary | ICD-10-CM | POA: Diagnosis not present

## 2024-04-06 DIAGNOSIS — Z8542 Personal history of malignant neoplasm of other parts of uterus: Secondary | ICD-10-CM | POA: Diagnosis not present

## 2024-04-08 DIAGNOSIS — G300 Alzheimer's disease with early onset: Secondary | ICD-10-CM | POA: Diagnosis not present

## 2024-04-08 DIAGNOSIS — I152 Hypertension secondary to endocrine disorders: Secondary | ICD-10-CM | POA: Diagnosis not present

## 2024-04-08 DIAGNOSIS — E1169 Type 2 diabetes mellitus with other specified complication: Secondary | ICD-10-CM | POA: Diagnosis not present

## 2024-04-08 DIAGNOSIS — G620 Drug-induced polyneuropathy: Secondary | ICD-10-CM | POA: Diagnosis not present

## 2024-04-08 DIAGNOSIS — E1159 Type 2 diabetes mellitus with other circulatory complications: Secondary | ICD-10-CM | POA: Diagnosis not present

## 2024-04-08 DIAGNOSIS — K7460 Unspecified cirrhosis of liver: Secondary | ICD-10-CM | POA: Diagnosis not present

## 2024-04-14 ENCOUNTER — Ambulatory Visit (HOSPITAL_COMMUNITY)
Admission: RE | Admit: 2024-04-14 | Discharge: 2024-04-14 | Disposition: A | Discharge status: Home / Self Care | Source: Ambulatory Visit | Attending: Family | Admitting: Family

## 2024-04-14 DIAGNOSIS — R42 Dizziness and giddiness: Secondary | ICD-10-CM | POA: Insufficient documentation

## 2024-04-14 DIAGNOSIS — R296 Repeated falls: Secondary | ICD-10-CM | POA: Insufficient documentation

## 2024-04-14 DIAGNOSIS — I672 Cerebral atherosclerosis: Secondary | ICD-10-CM | POA: Diagnosis not present

## 2024-04-15 DIAGNOSIS — I152 Hypertension secondary to endocrine disorders: Secondary | ICD-10-CM | POA: Diagnosis not present

## 2024-04-15 DIAGNOSIS — G300 Alzheimer's disease with early onset: Secondary | ICD-10-CM | POA: Diagnosis not present

## 2024-04-15 DIAGNOSIS — E1159 Type 2 diabetes mellitus with other circulatory complications: Secondary | ICD-10-CM | POA: Diagnosis not present

## 2024-04-15 DIAGNOSIS — B351 Tinea unguium: Secondary | ICD-10-CM | POA: Diagnosis not present

## 2024-04-15 DIAGNOSIS — G620 Drug-induced polyneuropathy: Secondary | ICD-10-CM | POA: Diagnosis not present

## 2024-04-15 DIAGNOSIS — M79674 Pain in right toe(s): Secondary | ICD-10-CM | POA: Diagnosis not present

## 2024-04-15 DIAGNOSIS — E1169 Type 2 diabetes mellitus with other specified complication: Secondary | ICD-10-CM | POA: Diagnosis not present

## 2024-04-15 DIAGNOSIS — E1142 Type 2 diabetes mellitus with diabetic polyneuropathy: Secondary | ICD-10-CM | POA: Diagnosis not present

## 2024-04-15 DIAGNOSIS — K7460 Unspecified cirrhosis of liver: Secondary | ICD-10-CM | POA: Diagnosis not present

## 2024-04-15 DIAGNOSIS — M79675 Pain in left toe(s): Secondary | ICD-10-CM | POA: Diagnosis not present

## 2024-04-15 DIAGNOSIS — L84 Corns and callosities: Secondary | ICD-10-CM | POA: Diagnosis not present

## 2024-04-19 ENCOUNTER — Other Ambulatory Visit: Payer: Self-pay | Admitting: Family

## 2024-04-19 DIAGNOSIS — B3731 Acute candidiasis of vulva and vagina: Secondary | ICD-10-CM

## 2024-04-19 DIAGNOSIS — I872 Venous insufficiency (chronic) (peripheral): Secondary | ICD-10-CM | POA: Diagnosis not present

## 2024-04-19 DIAGNOSIS — L820 Inflamed seborrheic keratosis: Secondary | ICD-10-CM | POA: Diagnosis not present

## 2024-04-20 DIAGNOSIS — G620 Drug-induced polyneuropathy: Secondary | ICD-10-CM | POA: Diagnosis not present

## 2024-04-20 DIAGNOSIS — K7460 Unspecified cirrhosis of liver: Secondary | ICD-10-CM | POA: Diagnosis not present

## 2024-04-20 DIAGNOSIS — E1159 Type 2 diabetes mellitus with other circulatory complications: Secondary | ICD-10-CM | POA: Diagnosis not present

## 2024-04-20 DIAGNOSIS — I152 Hypertension secondary to endocrine disorders: Secondary | ICD-10-CM | POA: Diagnosis not present

## 2024-04-20 DIAGNOSIS — E1169 Type 2 diabetes mellitus with other specified complication: Secondary | ICD-10-CM | POA: Diagnosis not present

## 2024-04-20 DIAGNOSIS — G300 Alzheimer's disease with early onset: Secondary | ICD-10-CM | POA: Diagnosis not present

## 2024-04-22 ENCOUNTER — Other Ambulatory Visit: Payer: Self-pay | Admitting: Family

## 2024-04-22 ENCOUNTER — Encounter: Payer: Self-pay | Admitting: Family

## 2024-04-22 ENCOUNTER — Ambulatory Visit (INDEPENDENT_AMBULATORY_CARE_PROVIDER_SITE_OTHER): Admitting: Family

## 2024-04-22 VITALS — BP 126/75 | HR 72 | Temp 97.1°F | Ht 61.0 in | Wt 140.0 lb

## 2024-04-22 DIAGNOSIS — L089 Local infection of the skin and subcutaneous tissue, unspecified: Secondary | ICD-10-CM

## 2024-04-22 DIAGNOSIS — R296 Repeated falls: Secondary | ICD-10-CM | POA: Diagnosis not present

## 2024-04-22 DIAGNOSIS — R42 Dizziness and giddiness: Secondary | ICD-10-CM

## 2024-04-22 DIAGNOSIS — G300 Alzheimer's disease with early onset: Secondary | ICD-10-CM

## 2024-04-22 DIAGNOSIS — F02A0 Dementia in other diseases classified elsewhere, mild, without behavioral disturbance, psychotic disturbance, mood disturbance, and anxiety: Secondary | ICD-10-CM | POA: Diagnosis not present

## 2024-04-22 DIAGNOSIS — H8110 Benign paroxysmal vertigo, unspecified ear: Secondary | ICD-10-CM

## 2024-04-22 MED ORDER — MEMANTINE HCL 10 MG PO TABS: 10.0000 mg | ORAL_TABLET | Freq: Two times a day (BID) | ORAL | 2 refills | Status: AC

## 2024-04-22 MED ORDER — DONEPEZIL HCL 10 MG PO TABS: 10.0000 mg | ORAL_TABLET | Freq: Every day | ORAL | 2 refills | Status: AC

## 2024-04-22 MED ORDER — MUPIROCIN CALCIUM 2 % EX CREA: 1.0000 | TOPICAL_CREAM | Freq: Two times a day (BID) | CUTANEOUS | 0 refills | Status: DC

## 2024-04-22 NOTE — Progress Notes (Signed)
 Subjective:    Patient ID: Alexandria Irwin, female    DOB: 09/21/1945, 78 y.o.   MRN: [id number]  Chief Complaint  Patient presents with   Follow-up   Pt presents to the office today on multiple complaints. She was seen on 03/23/24 and had increase falls and dizziness. She had a CT head that showed, 1. No acute intracranial abnormality related to the clinical history of vertigo, dizziness, and frequent falls. 2. Global parenchymal volume loss and confluent white matter hypodensity compatible with chronic microvascular ischemic changes.  Her labs were stable.   She was also started on Namenda  and Aricept  for memory changes and dementia. States she has been able to tolerate these well.   She was having increased anxiety. We increased her Lexapro  to 20 mg from 10 mg. She reports she is slightly better.   Complaining of redness and discharge of her umbilical area that she noticed a few weeks ago.  Fall  Dizziness This is a recurrent problem. The current episode started more than 1 month ago. The problem has been waxing and waning.      Review of Systems  Neurological:  Positive for dizziness.  All other systems reviewed and are negative.   Social History   Socioeconomic History   Marital status: Widowed    Spouse name: Garen   Number of children: 4   Years of education: Not on file   Highest education level: 12th grade  Occupational History   Occupation: retired  Tobacco Use   Smoking status: Never   Smokeless tobacco: Never  Vaping Use   Vaping status: Never Used  Substance and Sexual Activity   Alcohol use: No   Drug use: No   Sexual activity: Yes    Birth control/protection: Post-menopausal  Other Topics Concern   Not on file  Social History Narrative   Her husband passed 05/2020 - her daughter, Olam lives with her   Social Drivers of Health   Financial Resource Strain: Patient Declined (04/18/2024)   Overall Financial Resource Strain (CARDIA)     Difficulty of Paying Living Expenses: Patient declined  Recent Concern: Financial Resource Strain - Medium Risk (02/13/2024)   Overall Financial Resource Strain (CARDIA)    Difficulty of Paying Living Expenses: Somewhat hard  Food Insecurity: Patient Declined (04/18/2024)   Hunger Vital Sign    Worried About Running Out of Food in the Last Year: Patient declined    Ran Out of Food in the Last Year: Patient declined  Transportation Needs: No Transportation Needs (04/18/2024)   PRAPARE - Administrator, Civil Service (Medical): No    Lack of Transportation (Non-Medical): No  Physical Activity: Unknown (04/18/2024)   Exercise Vital Sign    Days of Exercise per Week: Patient declined    Minutes of Exercise per Session: Not on file  Recent Concern: Physical Activity - Inactive (02/13/2024)   Exercise Vital Sign    Days of Exercise per Week: 0 days    Minutes of Exercise per Session: 0 min  Stress: Patient Declined (04/18/2024)   [employer] of Occupational Health - Occupational Stress Questionnaire    Feeling of Stress: Patient declined  Recent Concern: Stress - Stress Concern Present (02/13/2024)   [employer] of Occupational Health - Occupational Stress Questionnaire    Feeling of Stress: To some extent  Social Connections: Socially Isolated (04/18/2024)   Social Connection and Isolation Panel    Frequency of Communication with Friends and Family: More than three  times a week    Frequency of Social Gatherings with Friends and Family: More than three times a week    Attends Religious Services: Patient declined    Active Member of Clubs or Organizations: No    Attends Banker Meetings: Not on file    Marital Status: Widowed   Family History  Problem Relation Age of Onset   Pancreatic cancer Mother 2       deceased 27   Ovarian cancer Mother        dx 29s   Cancer Other 80       maternal half-sister; unk. type        Objective:   Physical  Exam Vitals reviewed.  Constitutional:      General: She is not in acute distress.    Appearance: She is well-developed.  HENT:     Head: Normocephalic and atraumatic.     Right Ear: Tympanic membrane normal.     Left Ear: Tympanic membrane normal.  Eyes:     Pupils: Pupils are equal, round, and reactive to light.  Neck:     Thyroid : No thyromegaly.  Cardiovascular:     Rate and Rhythm: Normal rate and regular rhythm.     Heart sounds: Normal heart sounds. No murmur heard. Pulmonary:     Effort: Pulmonary effort is normal. No respiratory distress.     Breath sounds: Normal breath sounds. No wheezing.  Abdominal:     General: Bowel sounds are normal. There is no distension.     Palpations: Abdomen is soft.     Tenderness: There is no abdominal tenderness.  Musculoskeletal:        General: No tenderness. Normal range of motion.     Cervical back: Normal range of motion and neck supple.  Skin:    General: Skin is warm and dry.  Neurological:     Mental Status: She is alert and oriented to person, place, and time.     Cranial Nerves: No cranial nerve deficit.     Deep Tendon Reflexes: Reflexes are normal and symmetric.  Psychiatric:        Behavior: Behavior normal.        Thought Content: Thought content normal.        Judgment: Judgment normal.       BP 126/75   Pulse 72   Temp (!) 97.1 F (36.2 C) (Temporal)   Ht 5' 1 (1.549 m)   Wt 140 lb (63.5 kg)   SpO2 93%   BMI 26.45 kg/m      Assessment & Plan:  [name] comes in today with chief complaint of Follow-up   Diagnosis and orders addressed:  1. 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, psychotic disturbance, mood disturbance, or anxiety (HCC) Will increase Namenda  to 10 mg from 5 mg Will increase Aricept  10 mg from 5 mg Memory strategies discussed  - memantine  (NAMENDA ) 10 MG tablet; Take 1 tablet (10 mg total) by mouth 2 (two) times daily.  Dispense: 180 tablet; Refill:  2 - donepezil  (ARICEPT ) 10 MG tablet; Take 1 tablet (10 mg total) by mouth at bedtime.  Dispense: 90 tablet; Refill: 2  2. Frequent falls (Primary) Fall preventions  - Ambulatory referral to ENT  3. Dizziness Epley exercises discussed, handout given  Fall preventions discussed  Referral to ENT - Ambulatory referral to ENT  4. Benign paroxysmal positional vertigo, unspecified laterality - Ambulatory referral to ENT  5. Skin infection Bactroban  Keep clean and dry - mupirocin cream (BACTROBAN) 2 %; Apply 1 Application topically 2 (two) times daily.  Dispense: 15 g; Refill: 0   Labs reviewed  Continue current medications  Keep follow up with specialists  Health Maintenance reviewed Diet and exercise encouraged  Return in about 3 months (around 07/23/2024), or if symptoms worsen or fail to improve.    Bari Learn, FNP

## 2024-04-22 NOTE — Patient Instructions (Signed)
 Benign Positional Vertigo Vertigo is the feeling that you or your surroundings are moving when they are not. Benign positional vertigo is the most common form of vertigo. This is usually a harmless condition (benign). This condition is positional. This means that symptoms are triggered by certain movements and positions. This condition can be dangerous if it occurs while you are doing something that could cause harm to yourself or others. This includes activities such as driving or operating machinery. What are the causes? The inner ear has fluid-filled canals that help your brain sense movement and balance. When the fluid moves, the brain receives messages about your body's position. With benign positional vertigo, calcium crystals in the inner ear break free and disturb the inner ear area. This causes your brain to receive confusing messages about your body's position. What increases the risk? You are more likely to develop this condition if: You are a woman. You are 80 years of age or older. You have recently had a head injury. You have an inner ear disease. What are the signs or symptoms? Symptoms of this condition usually happen when you move your head or your eyes in different directions. Symptoms may start suddenly and usually last for less than a minute. They include: Loss of balance and falling. Feeling like you are spinning or moving. Feeling like your surroundings are spinning or moving. Nausea and vomiting. Blurred vision. Dizziness. Involuntary eye movement (nystagmus). Symptoms can be mild and cause only minor problems, or they can be severe and interfere with daily life. Episodes of benign positional vertigo may return (recur) over time. Symptoms may also improve over time. How is this diagnosed? This condition may be diagnosed based on: Your medical history. A physical exam of the head, neck, and ears. Positional tests to check for or stimulate vertigo. You may be asked to  turn your head and change positions, such as going from sitting to lying down. A health care provider will watch for symptoms of vertigo. You may be referred to a health care provider who specializes in ear, nose, and throat problems (ENT or otolaryngologist) or a provider who specializes in disorders of the nervous system (neurologist). How is this treated?  This condition may be treated in a session in which your health care provider moves your head in specific positions to help the displaced crystals in your inner ear move. Treatment for this condition may take several sessions. Surgery may be needed in severe cases, but this is rare. In some cases, benign positional vertigo may resolve on its own in 2-4 weeks. Follow these instructions at home: Safety Move slowly. Avoid sudden body or head movements or certain positions, as told by your health care provider. Avoid driving or operating machinery until your health care provider says it is safe. Avoid doing any tasks that would be dangerous to you or others if vertigo occurs. If you have trouble walking or keeping your balance, try using a cane for stability. If you feel dizzy or unstable, sit down right away. Return to your normal activities as told by your health care provider. Ask your health care provider what activities are safe for you. General instructions Take over-the-counter and prescription medicines only as told by your health care provider. Drink enough fluid to keep your urine pale yellow. Keep all follow-up visits. This is important. Contact a health care provider if: You have a fever. Your condition gets worse or you develop new symptoms. Your family or friends notice any behavioral changes.  You have nausea or vomiting that gets worse. You have numbness or a prickling and tingling sensation. Get help right away if you: Have difficulty speaking or moving. Are always dizzy or faint. Develop severe headaches. Have weakness in  your legs or arms. Have changes in your hearing or vision. Develop a stiff neck. Develop sensitivity to light. These symptoms may represent a serious problem that is an emergency. Do not wait to see if the symptoms will go away. Get medical help right away. Call your local emergency services (911 in the [place] Do not drive yourself to the hospital. Summary Vertigo is the feeling that you or your surroundings are moving when they are not. Benign positional vertigo is the most common form of vertigo. This condition is caused by calcium crystals in the inner ear that become displaced. This causes a disturbance in an area of the inner ear that helps your brain sense movement and balance. Symptoms include loss of balance and falling, feeling that you or your surroundings are moving, nausea and vomiting, and blurred vision. This condition can be diagnosed based on symptoms, a physical exam, and positional tests. Follow safety instructions as told by your health care provider and keep all follow-up visits. This is important. This information is not intended to replace advice given to you by your health care provider. Make sure you discuss any questions you have with your health care provider. Document Revised: 01/06/2023 Document Reviewed: 01/06/2023 Elsevier Patient Education  2024 [employer].

## 2024-04-23 ENCOUNTER — Ambulatory Visit

## 2024-04-23 DIAGNOSIS — K7460 Unspecified cirrhosis of liver: Secondary | ICD-10-CM | POA: Diagnosis not present

## 2024-04-23 DIAGNOSIS — R296 Repeated falls: Secondary | ICD-10-CM

## 2024-04-23 DIAGNOSIS — F411 Generalized anxiety disorder: Secondary | ICD-10-CM | POA: Diagnosis not present

## 2024-04-23 DIAGNOSIS — F0284 Dementia in other diseases classified elsewhere, unspecified severity, with anxiety: Secondary | ICD-10-CM | POA: Diagnosis not present

## 2024-04-23 DIAGNOSIS — E785 Hyperlipidemia, unspecified: Secondary | ICD-10-CM | POA: Diagnosis not present

## 2024-04-23 DIAGNOSIS — I152 Hypertension secondary to endocrine disorders: Secondary | ICD-10-CM | POA: Diagnosis not present

## 2024-04-23 DIAGNOSIS — G620 Drug-induced polyneuropathy: Secondary | ICD-10-CM | POA: Diagnosis not present

## 2024-04-23 DIAGNOSIS — C4491 Basal cell carcinoma of skin, unspecified: Secondary | ICD-10-CM | POA: Diagnosis not present

## 2024-04-23 DIAGNOSIS — D519 Vitamin B12 deficiency anemia, unspecified: Secondary | ICD-10-CM

## 2024-04-23 DIAGNOSIS — E1169 Type 2 diabetes mellitus with other specified complication: Secondary | ICD-10-CM

## 2024-04-23 DIAGNOSIS — G300 Alzheimer's disease with early onset: Secondary | ICD-10-CM | POA: Diagnosis not present

## 2024-04-23 DIAGNOSIS — E1159 Type 2 diabetes mellitus with other circulatory complications: Secondary | ICD-10-CM

## 2024-04-30 DIAGNOSIS — I152 Hypertension secondary to endocrine disorders: Secondary | ICD-10-CM | POA: Diagnosis not present

## 2024-04-30 DIAGNOSIS — G300 Alzheimer's disease with early onset: Secondary | ICD-10-CM | POA: Diagnosis not present

## 2024-04-30 DIAGNOSIS — E1169 Type 2 diabetes mellitus with other specified complication: Secondary | ICD-10-CM | POA: Diagnosis not present

## 2024-04-30 DIAGNOSIS — K7460 Unspecified cirrhosis of liver: Secondary | ICD-10-CM | POA: Diagnosis not present

## 2024-04-30 DIAGNOSIS — G620 Drug-induced polyneuropathy: Secondary | ICD-10-CM | POA: Diagnosis not present

## 2024-04-30 DIAGNOSIS — E1159 Type 2 diabetes mellitus with other circulatory complications: Secondary | ICD-10-CM | POA: Diagnosis not present

## 2024-05-03 ENCOUNTER — Other Ambulatory Visit: Payer: Self-pay | Admitting: Family

## 2024-05-04 DIAGNOSIS — E1169 Type 2 diabetes mellitus with other specified complication: Secondary | ICD-10-CM | POA: Diagnosis not present

## 2024-05-04 DIAGNOSIS — G300 Alzheimer's disease with early onset: Secondary | ICD-10-CM | POA: Diagnosis not present

## 2024-05-04 DIAGNOSIS — K7460 Unspecified cirrhosis of liver: Secondary | ICD-10-CM | POA: Diagnosis not present

## 2024-05-04 DIAGNOSIS — G620 Drug-induced polyneuropathy: Secondary | ICD-10-CM | POA: Diagnosis not present

## 2024-05-04 DIAGNOSIS — E1159 Type 2 diabetes mellitus with other circulatory complications: Secondary | ICD-10-CM | POA: Diagnosis not present

## 2024-05-04 DIAGNOSIS — I152 Hypertension secondary to endocrine disorders: Secondary | ICD-10-CM | POA: Diagnosis not present

## 2024-05-06 DIAGNOSIS — C4491 Basal cell carcinoma of skin, unspecified: Secondary | ICD-10-CM | POA: Diagnosis not present

## 2024-05-06 DIAGNOSIS — Z7982 Long term (current) use of aspirin: Secondary | ICD-10-CM | POA: Diagnosis not present

## 2024-05-06 DIAGNOSIS — I152 Hypertension secondary to endocrine disorders: Secondary | ICD-10-CM | POA: Diagnosis not present

## 2024-05-06 DIAGNOSIS — K7460 Unspecified cirrhosis of liver: Secondary | ICD-10-CM | POA: Diagnosis not present

## 2024-05-06 DIAGNOSIS — E785 Hyperlipidemia, unspecified: Secondary | ICD-10-CM | POA: Diagnosis not present

## 2024-05-06 DIAGNOSIS — Z6827 Body mass index (BMI) 27.0-27.9, adult: Secondary | ICD-10-CM | POA: Diagnosis not present

## 2024-05-06 DIAGNOSIS — E1169 Type 2 diabetes mellitus with other specified complication: Secondary | ICD-10-CM | POA: Diagnosis not present

## 2024-05-06 DIAGNOSIS — E039 Hypothyroidism, unspecified: Secondary | ICD-10-CM | POA: Diagnosis not present

## 2024-05-06 DIAGNOSIS — G620 Drug-induced polyneuropathy: Secondary | ICD-10-CM | POA: Diagnosis not present

## 2024-05-06 DIAGNOSIS — Z9181 History of falling: Secondary | ICD-10-CM | POA: Diagnosis not present

## 2024-05-06 DIAGNOSIS — G300 Alzheimer's disease with early onset: Secondary | ICD-10-CM | POA: Diagnosis not present

## 2024-05-06 DIAGNOSIS — Z556 Problems related to health literacy: Secondary | ICD-10-CM | POA: Diagnosis not present

## 2024-05-06 DIAGNOSIS — E1159 Type 2 diabetes mellitus with other circulatory complications: Secondary | ICD-10-CM | POA: Diagnosis not present

## 2024-05-06 DIAGNOSIS — R296 Repeated falls: Secondary | ICD-10-CM | POA: Diagnosis not present

## 2024-05-06 DIAGNOSIS — F0284 Dementia in other diseases classified elsewhere, unspecified severity, with anxiety: Secondary | ICD-10-CM | POA: Diagnosis not present

## 2024-05-06 DIAGNOSIS — D519 Vitamin B12 deficiency anemia, unspecified: Secondary | ICD-10-CM | POA: Diagnosis not present

## 2024-05-06 DIAGNOSIS — F411 Generalized anxiety disorder: Secondary | ICD-10-CM | POA: Diagnosis not present

## 2024-05-06 DIAGNOSIS — Z7984 Long term (current) use of oral hypoglycemic drugs: Secondary | ICD-10-CM | POA: Diagnosis not present

## 2024-05-06 DIAGNOSIS — E663 Overweight: Secondary | ICD-10-CM | POA: Diagnosis not present

## 2024-05-06 DIAGNOSIS — Z8542 Personal history of malignant neoplasm of other parts of uterus: Secondary | ICD-10-CM | POA: Diagnosis not present

## 2024-05-13 DIAGNOSIS — K7460 Unspecified cirrhosis of liver: Secondary | ICD-10-CM | POA: Diagnosis not present

## 2024-05-13 DIAGNOSIS — G300 Alzheimer's disease with early onset: Secondary | ICD-10-CM | POA: Diagnosis not present

## 2024-05-13 DIAGNOSIS — I152 Hypertension secondary to endocrine disorders: Secondary | ICD-10-CM | POA: Diagnosis not present

## 2024-05-13 DIAGNOSIS — G620 Drug-induced polyneuropathy: Secondary | ICD-10-CM | POA: Diagnosis not present

## 2024-05-13 DIAGNOSIS — E1159 Type 2 diabetes mellitus with other circulatory complications: Secondary | ICD-10-CM | POA: Diagnosis not present

## 2024-05-13 DIAGNOSIS — E1169 Type 2 diabetes mellitus with other specified complication: Secondary | ICD-10-CM | POA: Diagnosis not present

## 2024-05-17 DIAGNOSIS — G620 Drug-induced polyneuropathy: Secondary | ICD-10-CM | POA: Diagnosis not present

## 2024-05-17 DIAGNOSIS — G300 Alzheimer's disease with early onset: Secondary | ICD-10-CM | POA: Diagnosis not present

## 2024-05-17 DIAGNOSIS — E1159 Type 2 diabetes mellitus with other circulatory complications: Secondary | ICD-10-CM | POA: Diagnosis not present

## 2024-05-17 DIAGNOSIS — K7460 Unspecified cirrhosis of liver: Secondary | ICD-10-CM | POA: Diagnosis not present

## 2024-05-17 DIAGNOSIS — E1169 Type 2 diabetes mellitus with other specified complication: Secondary | ICD-10-CM | POA: Diagnosis not present

## 2024-05-17 DIAGNOSIS — I152 Hypertension secondary to endocrine disorders: Secondary | ICD-10-CM | POA: Diagnosis not present

## 2024-05-25 ENCOUNTER — Ambulatory Visit (INDEPENDENT_AMBULATORY_CARE_PROVIDER_SITE_OTHER): Admitting: Otolaryngology

## 2024-05-25 VITALS — BP 118/72 | HR 56 | Temp 97.5°F | Ht 62.0 in | Wt 135.0 lb

## 2024-05-25 DIAGNOSIS — R42 Dizziness and giddiness: Secondary | ICD-10-CM

## 2024-05-25 DIAGNOSIS — G620 Drug-induced polyneuropathy: Secondary | ICD-10-CM | POA: Diagnosis not present

## 2024-05-25 DIAGNOSIS — E1169 Type 2 diabetes mellitus with other specified complication: Secondary | ICD-10-CM | POA: Diagnosis not present

## 2024-05-25 DIAGNOSIS — G300 Alzheimer's disease with early onset: Secondary | ICD-10-CM | POA: Diagnosis not present

## 2024-05-25 DIAGNOSIS — H9193 Unspecified hearing loss, bilateral: Secondary | ICD-10-CM | POA: Diagnosis not present

## 2024-05-25 DIAGNOSIS — K7460 Unspecified cirrhosis of liver: Secondary | ICD-10-CM | POA: Diagnosis not present

## 2024-05-25 DIAGNOSIS — E1159 Type 2 diabetes mellitus with other circulatory complications: Secondary | ICD-10-CM | POA: Diagnosis not present

## 2024-05-25 DIAGNOSIS — I152 Hypertension secondary to endocrine disorders: Secondary | ICD-10-CM | POA: Diagnosis not present

## 2024-05-25 NOTE — Progress Notes (Signed)
 Reason for Consult: Imbalance Referring Physician: Dr. Lavell Delayne Alexandria Irwin is an 78 y.o. female.  HPI: History of about 2 months of feeling imbalance.  This particularly affects her when she looks up and reaches up.  Also when she leans down.  She does not have any change in her hearing or new tinnitus with the episodes.  She has had imbalance problems long before 2 months ago just seems worse.  No new medications.  She has not had any ear pain.  No drainage from either ear.  No headaches.  Past Medical History:  Diagnosis Date   Cataract    Endometrial adenocarcinoma (HCC)    Endometrial adenocarcinoma (HCC) 06/25/2017   Endometrial cancer, FIGO stage IIIA (HCC) 08/12/2017   Environmental and seasonal allergies    Fall 07/2019   Family history of ovarian cancer    Family history of pancreatic cancer    Fx anat neck humerus-open 07/2019   Genetic testing 09/19/2017   Multi-Cancer panel (83 genes) @ [facility] - No pathogenic mutations detected   GERD (gastroesophageal reflux disease)    History of DVT of lower extremity    early 2000s--- lower right leg   History of radiation therapy 09/18/2017-10/13/2017   vaginal brachytherapy (HDR) - endometrial      Dr Lynwood Nasuti   Hyperlipidemia    Hypertension    Hypothyroidism    Type 2 diabetes mellitus (HCC)    followed by pcp   Umbilical hernia    Vitamin B 12 deficiency    Vitamin D  deficiency    Wears glasses     Past Surgical History:  Procedure Laterality Date   BREAST BIOPSY Left 2009   benign   CATARACT EXTRACTION W/ INTRAOCULAR LENS IMPLANT Left 2018   CATARACT EXTRACTION W/PHACO Right 03/14/2014   Procedure: CATARACT EXTRACTION PHACO AND INTRAOCULAR LENS PLACEMENT (IOC);  Surgeon: Cherene Mania, MD;  Location: [facility];  Service: Ophthalmology;  Laterality: Right;  CDE: 8.34   COLONOSCOPY WITH PROPOFOL  N/A 08/11/2020   Procedure: COLONOSCOPY WITH PROPOFOL ;  Surgeon: Eartha Angelia Sieving, MD;  Location: [facility];  Service:  Gastroenterology;  Laterality: N/A;  7:30   FOOT GANGLION EXCISION Right 2013   HYSTEROSCOPY WITH D & C N/A 06/13/2017   Procedure: DILATATION AND CURETTAGE /HYSTEROSCOPY;  Surgeon: Debrah Ade, MD;  Location: [facility];  Service: Gynecology;  Laterality: N/A;   IR FLUORO GUIDE PORT INSERTION RIGHT  08/27/2017   IR REMOVAL TUN ACCESS W/ PORT W/O FL MOD SED  04/27/2018   IR US  GUIDE VASC ACCESS RIGHT  08/27/2017   LAPAROSCOPIC CHOLECYSTECTOMY  2005   POLYPECTOMY  08/11/2020   Procedure: POLYPECTOMY INTESTINAL;  Surgeon: Eartha Angelia Sieving, MD;  Location: [facility];  Service: Gastroenterology;;   ROBOTIC ASSISTED TOTAL HYSTERECTOMY WITH BILATERAL SALPINGO OOPHERECTOMY Bilateral 08/05/2017   Procedure: ROBOTIC ASSISTED TOTAL HYSTERECTOMY WITH BILATERAL SALPINGO OOPHORECTOMY;  Surgeon: Eloy Herring, MD;  Location: [facility];  Service: Gynecology;  Laterality: Bilateral;   SENTINEL NODE BIOPSY Bilateral 08/05/2017   Procedure: SENTINEL NODE BIOPSY;  Surgeon: Eloy Herring, MD;  Location: [facility];  Service: Gynecology;  Laterality: Bilateral;   UMBILICAL HERNIA REPAIR N/A 08/05/2017   Procedure: HERNIA REPAIR UMBILICAL ADULT;  Surgeon: Eloy Herring, MD;  Location: [facility];  Service: Gynecology;  Laterality: N/A;   VEIN SURGERY Right 2011   vein stripping-leg  and laser treatment    Family History  Problem Relation Age of Onset   Pancreatic cancer Mother 46  deceased 86   Ovarian cancer Mother        dx 41s   Cancer Other 39       maternal half-sister; unk. type    Social History:  reports that she has never smoked. She has never used smokeless tobacco. She reports that she does not drink alcohol and does not use drugs.  Allergies:  Allergies  Allergen Reactions   Neomycin -Bacitracin Zn-Polymyx Rash and Hives   Ace Inhibitors Cough   Ciprocin-Fluocin-Procin [Fluocinolone] Hives   Norvasc  [Amlodipine ] Cough   Latex Rash   Sulfa  Antibiotics Rash    Medications: I have reviewed the  patient's current medications.  No results found for this or any previous visit (from the past 48 hours).  No results found.  ROS There were no vitals taken for this visit. Physical Exam Constitutional:      Appearance: Normal appearance.  HENT:     Head: Normocephalic and atraumatic.     Right Ear: Tympanic membrane is without lesions and middle ear aerated, ear canal and external ear normal.     Left Ear: Tympanic membrane is without lesions and middle ear aerated, ear canal and external ear normal.     Nose: Nose normal. Turbinates with mild hypertrophy, No significant swelling or masses.     Oral cavity/oropharynx: Mucous membranes are moist. No lesions or masses    Larynx: normal voice. Mirror attempted without success    Eyes:     Extraocular Movements: Extraocular movements intact.     Conjunctiva/sclera: Conjunctivae normal.     Pupils: Pupils are equal, round, and reactive to light.  Cardiovascular:     Rate and Rhythm: Normal rate.  Pulmonary:     Effort: Pulmonary effort is normal.  Musculoskeletal:     Cervical back: Normal range of motion and neck supple. No rigidity.  Lymphadenopathy:     Cervical: No cervical adenopathy or masses.salivary glands without lesions. .  Neurological:     Mental Status: He is alert. CN 2-12 intact. No nystagmus      Assessment/Plan: Dizziness/imbalance-she has imbalance it does not sound like any specific diagnosis.  I suspect it is a combination of vision, proprioception, and inner ear.  There is no pathology seen on her ears.  She has chronic longstanding hearing loss equal bilaterally and does not want an audiogram.  I doubt she has benign paroxysmal positional vertigo.  She will go to vestibular rehab and we discussed using a cane as a proprioception benefit.  Norleen Notice 05/25/2024, 3:34 PM

## 2024-05-31 ENCOUNTER — Ambulatory Visit: Admitting: Family

## 2024-06-08 ENCOUNTER — Other Ambulatory Visit: Payer: Self-pay | Admitting: Family

## 2024-06-08 DIAGNOSIS — F411 Generalized anxiety disorder: Secondary | ICD-10-CM

## 2024-06-08 DIAGNOSIS — F02A0 Dementia in other diseases classified elsewhere, mild, without behavioral disturbance, psychotic disturbance, mood disturbance, and anxiety: Secondary | ICD-10-CM

## 2024-07-05 ENCOUNTER — Ambulatory Visit (HOSPITAL_COMMUNITY)

## 2024-07-09 ENCOUNTER — Other Ambulatory Visit: Payer: Self-pay | Admitting: Family

## 2024-07-09 DIAGNOSIS — F411 Generalized anxiety disorder: Secondary | ICD-10-CM

## 2024-07-09 NOTE — Therapy (Incomplete)
 " OUTPATIENT PHYSICAL THERAPY VESTIBULAR EVALUATION     Patient Name: Alexandria Irwin MRN: [id number] DOB:1945-08-26, 79 y.o., female Today's Date: 07/09/2024  END OF SESSION:   Past Medical History:  Diagnosis Date   Cataract    Endometrial adenocarcinoma (HCC)    Endometrial adenocarcinoma (HCC) 06/25/2017   Endometrial cancer, FIGO stage IIIA (HCC) 08/12/2017   Environmental and seasonal allergies    Fall 07/2019   Family history of ovarian cancer    Family history of pancreatic cancer    Fx anat neck humerus-open 07/2019   Genetic testing 09/19/2017   Multi-Cancer panel (83 genes) @ [facility] - No pathogenic mutations detected   GERD (gastroesophageal reflux disease)    History of DVT of lower extremity    early 2000s--- lower right leg   History of radiation therapy 09/18/2017-10/13/2017   vaginal brachytherapy (HDR) - endometrial      Dr Lynwood Nasuti   Hyperlipidemia    Hypertension    Hypothyroidism    Type 2 diabetes mellitus (HCC)    followed by pcp   Umbilical hernia    Vitamin B 12 deficiency    Vitamin D  deficiency    Wears glasses    Past Surgical History:  Procedure Laterality Date   BREAST BIOPSY Left 2009   benign   CATARACT EXTRACTION W/ INTRAOCULAR LENS IMPLANT Left 2018   CATARACT EXTRACTION W/PHACO Right 03/14/2014   Procedure: CATARACT EXTRACTION PHACO AND INTRAOCULAR LENS PLACEMENT (IOC);  Surgeon: Cherene Mania, MD;  Location: [facility];  Service: Ophthalmology;  Laterality: Right;  CDE: 8.34   COLONOSCOPY WITH PROPOFOL  N/A 08/11/2020   Procedure: COLONOSCOPY WITH PROPOFOL ;  Surgeon: Eartha Angelia Sieving, MD;  Location: [facility];  Service: Gastroenterology;  Laterality: N/A;  7:30   FOOT GANGLION EXCISION Right 2013   HYSTEROSCOPY WITH D & C N/A 06/13/2017   Procedure: DILATATION AND CURETTAGE /HYSTEROSCOPY;  Surgeon: Debrah Ade, MD;  Location: [facility];  Service: Gynecology;  Laterality: N/A;   IR FLUORO GUIDE PORT INSERTION RIGHT  08/27/2017    IR REMOVAL TUN ACCESS W/ PORT W/O FL MOD SED  04/27/2018   IR US  GUIDE VASC ACCESS RIGHT  08/27/2017   LAPAROSCOPIC CHOLECYSTECTOMY  2005   POLYPECTOMY  08/11/2020   Procedure: POLYPECTOMY INTESTINAL;  Surgeon: Eartha Angelia Sieving, MD;  Location: [facility];  Service: Gastroenterology;;   ROBOTIC ASSISTED TOTAL HYSTERECTOMY WITH BILATERAL SALPINGO OOPHERECTOMY Bilateral 08/05/2017   Procedure: ROBOTIC ASSISTED TOTAL HYSTERECTOMY WITH BILATERAL SALPINGO OOPHORECTOMY;  Surgeon: Eloy Herring, MD;  Location: [facility];  Service: Gynecology;  Laterality: Bilateral;   SENTINEL NODE BIOPSY Bilateral 08/05/2017   Procedure: SENTINEL NODE BIOPSY;  Surgeon: Eloy Herring, MD;  Location: [facility];  Service: Gynecology;  Laterality: Bilateral;   UMBILICAL HERNIA REPAIR N/A 08/05/2017   Procedure: HERNIA REPAIR UMBILICAL ADULT;  Surgeon: Eloy Herring, MD;  Location: [facility];  Service: Gynecology;  Laterality: N/A;   VEIN SURGERY Right 2011   vein stripping-leg  and laser treatment   Patient Active Problem List   Diagnosis Date Noted   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, psychotic disturbance, mood disturbance, or anxiety (HCC) 03/23/2024   GAD (generalized anxiety disorder) 03/23/2024   Frequent falls 03/23/2024   Dizziness 03/23/2024   Cirrhosis of liver without ascites, unspecified hepatic cirrhosis type (HCC) 04/30/2021   Fatty liver 07/06/2020   Peripheral neuropathy due to chemotherapy 10/14/2017   Genetic testing 09/19/2017   Family history of ovarian cancer  Family history of pancreatic cancer    Overweight (BMI 25.0-29.9) 08/26/2017   B12 deficiency 06/26/2017   Vitamin D  deficiency    Thrombophlebitis leg 09/18/2013   Hypertension associated with diabetes (HCC) 12/03/2012   Diabetes (HCC) 12/03/2012   Hypothyroidism 12/03/2012   Hyperlipidemia associated with type 2 diabetes mellitus (HCC) 12/03/2012    PCP: Lavell Bari LABOR, FNP REFERRING PROVIDER: Roark Rush, MD  REFERRING DIAG: R42 (ICD-10-CM) - Dizziness  THERAPY DIAG:  No diagnosis found.  ONSET DATE: ***  Rationale for Evaluation and Treatment: Rehabilitation  SUBJECTIVE:   SUBJECTIVE STATEMENT: *** Pt accompanied by: {accompnied:27141}  PERTINENT HISTORY: ***  PAIN:  Are you having pain? {OPRCPAIN:27236}  PRECAUTIONS: {Therapy precautions:24002}  RED FLAGS: {PT Red Flags:29287}   WEIGHT BEARING RESTRICTIONS: {Yes ***/No:24003}  FALLS: Has patient fallen in last 6 months? {fallsyesno:27318}  LIVING ENVIRONMENT: Lives with: {OPRC lives with:25569::lives with their family} Lives in: {Lives in:25570} Stairs: {opstairs:27293} Has following equipment at home: {Assistive devices:23999}  PLOF: {PLOF:24004}  PATIENT GOALS: ***  OBJECTIVE:  Note: Objective measures were completed at Evaluation unless otherwise noted.  DIAGNOSTIC FINDINGS: ***  COGNITION: Overall cognitive status: {cognition:24006}   SENSATION: {sensation:27233}  EDEMA:  {edema:24020}  MUSCLE TONE:  {LE tone:25568}  DTRs:  {DTR SITE:24025}  POSTURE:  {posture:25561}  Cervical ROM:    Active A/PROM (deg) eval  Flexion   Extension   Right lateral flexion   Left lateral flexion   Right rotation   Left rotation   (Blank rows = not tested)  STRENGTH: ***  LOWER EXTREMITY MMT:   MMT Right eval Left eval  Hip flexion    Hip abduction    Hip adduction    Hip internal rotation    Hip external rotation    Knee flexion    Knee extension    Ankle dorsiflexion    Ankle plantarflexion    Ankle inversion    Ankle eversion    (Blank rows = not tested)  BED MOBILITY:  {Bed mobility:24027}  TRANSFERS: Assistive device utilized: {Assistive devices:23999}  Sit to stand: {Levels of assistance:24026} Stand to sit: {Levels of assistance:24026} Chair to chair: {Levels of assistance:24026} Floor: {Levels of assistance:24026}  RAMP: {Levels of assistance:24026}  CURB:  {Levels of assistance:24026}  GAIT: Gait pattern: {gait characteristics:25376} Distance walked: *** Assistive device utilized: {Assistive devices:23999} Level of assistance: {Levels of assistance:24026} Comments: ***  FUNCTIONAL TESTS:  {Functional tests:24029}  PATIENT SURVEYS:  DHI: THE DIZZINESS HANDICAP INVENTORY (DHI)  P1. Does looking up increase your problem? {IYP:67009}  E2. Because of your problem, do you feel frustrated? {IYP:67009}  F3. Because of your problem, do you restrict your travel for business or recreation?  {IYP:67009}  P4. Does walking down the aisle of a supermarket increase your problems?  {IYP:67009}  F5. Because of your problem, do you have difficulty getting into or out of bed?  {IYP:67009}  F6. Does your problem significantly restrict your participation in social activities, such as going out to dinner, going to the movies, dancing, or going to parties? {IYP:67009}  F7. Because of your problem, do you have difficulty reading?  {DHI:32990}  P8. Does performing more ambitious activities such as sports, dancing, household chores (sweeping or putting dishes away) increase your problems?  {IYP:67009}  E9. Because of your problem, are you afraid to leave your home without having without having someone accompany you?  {IYP:67009}  E10. Because of your problem have you been embarrassed in front of others?  {DHI:32990}  P11. Do quick  movements of your head increase your problem?  {IYP:67009}  F12. Because of your problem, do you avoid heights?  {DHI:32990}  P13. Does turning over in bed increase your problem?  {IYP:67009}  F14. Because of your problem, is it difficult for you to do strenuous homework or yard work? {DHI:32990}  E15. Because of your problem, are you afraid people may think you are intoxicated? {IYP:67009}  F16. Because of your problem, is it difficult for you to go for a walk by yourself?  {DHI:32990}  P17. Does walking down a sidewalk increase your  problem?  {IYP:67009}  E18.Because of your problem, is it difficult for you to concentrate {DHI:32990}  F19. Because of your problem, is it difficult for you to walk around your house in the dark? {DHI:32990}  E20. Because of your problem, are you afraid to stay home alone?  {DHI:32990}  E21. Because of your problem, do you feel handicapped? {IYP:67009}  E22. Has the problem placed stress on your relationships with members of your family or friends? {IYP:67009}  E23. Because of your problem, are you depressed?  {IYP:67009}  F24. Does your problem interfere with your job or household responsibilities?  {DHI:32990}  P25. Does bending over increase your problem?  {IYP:67009}  TOTAL ***    DHI Scoring Instructions  The patient is asked to answer each question as it pertains to dizziness or unsteadiness problems, specifically  considering their condition during the last month. Questions are designed to incorporate functional (F), physical  (P), and emotional (E) impacts on disability.   Scores greater than 10 points should be referred to balance specialists for further evaluation.   16-34 Points (mild handicap)  36-52 Points (moderate handicap)  54+ Points (severe handicap)  Minimally Detectable Change: 17 points ([address] [place], 1990)  [place], G. SHAUNNA. and [place], C. W. (1990). The development of the Dizziness Handicap Inventory. Archives of Otolaryngology - Head and Neck Surgery 116(4): [id number].   VESTIBULAR ASSESSMENT:  GENERAL OBSERVATION: ***   SYMPTOM BEHAVIOR:  Subjective history: ***  Non-Vestibular symptoms: {nonvestibular symptoms:25260}  Type of dizziness: {Type of Dizziness:25255}  Frequency: ***  Duration: ***  Aggravating factors: {Aggravating Factors:25258}  Relieving factors: {Relieving Factors:25259}  Progression of symptoms: {DESC; BETTER/WORSE:18575}  OCULOMOTOR EXAM:  Ocular Alignment: {Ocular Alignment:25262}  Ocular ROM: {RANGE OF  MOTION:21649}  Spontaneous Nystagmus: {Spontaneous nystagmus:25263}  Gaze-Induced Nystagmus: {gaze-induced nystagmus:25264}  Smooth Pursuits: {smooth pursuit:25265}  Saccades: {saccades:25266}  Convergence/Divergence: *** cm   Cover-cross-cover test: {cover test:33756}   VESTIBULAR - OCULAR REFLEX:   Slow VOR: {slow VOR:25290}  VOR Cancellation: {vor cancellation:25291}  Head-Impulse Test: {head impulse test:25272}  Dynamic Visual Acuity: {dynamic visual acuity:25273}   POSITIONAL TESTING: {Positional tests:25271}  MOTION SENSITIVITY:  Motion Sensitivity Quotient Intensity: 0 = none, 1 = Lightheaded, 2 = Mild, 3 = Moderate, 4 = Severe, 5 = Vomiting  Intensity  1. Sitting to supine   2. Supine to L side   3. Supine to R side   4. Supine to sitting   5. L Hallpike-Dix   6. Up from L    7. R Hallpike-Dix   8. Up from R    9. Sitting, head tipped to L knee   10. Head up from L knee   11. Sitting, head tipped to R knee   12. Head up from R knee   13. Sitting head turns x5   14.Sitting head nods x5   15. In stance, 180 turn to L    16. In stance,  180 turn to R     OTHOSTATICS: {Exam; orthostatics:31331}  FUNCTIONAL GAIT: {Functional tests:24029}                                                                                                                             TREATMENT DATE: 07/12/2024 physical therapy evaluation   Canalith Repositioning:  {Canalith Repositioning:25283} Gaze Adaptation:  {gaze adaptation:25286} Habituation:  {habituation:25288} Other: ***  PATIENT EDUCATION: Education details: Patient educated on exam findings, POC, scope of PT, HEP, and ***. Person educated: Patient Education method: Explanation, Demonstration, and Handouts Education comprehension: verbalized understanding, returned demonstration, verbal cues required, and tactile cues required   HOME EXERCISE PROGRAM:  GOALS: Goals reviewed with patient? No  SHORT TERM GOALS:  Target date: ***  *** Baseline: Goal status: {GOALSTATUS:25110}  2.  *** Baseline:  Goal status: {GOALSTATUS:25110}  3.  *** Baseline:  Goal status: {GOALSTATUS:25110}  4.  *** Baseline:  Goal status: {GOALSTATUS:25110}  5.  *** Baseline:  Goal status: {GOALSTATUS:25110}  6.  *** Baseline:  Goal status: {GOALSTATUS:25110}  LONG TERM GOALS: Target date: ***  *** Baseline:  Goal status: {GOALSTATUS:25110}  2.  *** Baseline:  Goal status: {GOALSTATUS:25110}  3.  *** Baseline:  Goal status: {GOALSTATUS:25110}  4.  *** Baseline:  Goal status: {GOALSTATUS:25110}  5.  *** Baseline:  Goal status: {GOALSTATUS:25110}  6.  *** Baseline:  Goal status: {GOALSTATUS:25110}  ASSESSMENT:  CLINICAL IMPRESSION: Patient is a *** y.o. *** who was seen today for physical therapy evaluation and treatment for ***.   OBJECTIVE IMPAIRMENTS: {opptimpairments:25111}.   ACTIVITY LIMITATIONS: {activitylimitations:27494}  PARTICIPATION LIMITATIONS: {participationrestrictions:25113}  PERSONAL FACTORS: {Personal factors:25162} are also affecting patient's functional outcome.   REHAB POTENTIAL: Good  CLINICAL DECISION MAKING: Evolving/moderate complexity  EVALUATION COMPLEXITY: Moderate   PLAN:  PT FREQUENCY: {rehab frequency:25116}  PT DURATION: {rehab duration:25117}  PLANNED INTERVENTIONS: 97164- PT Re-evaluation, 97110-Therapeutic exercises, 97530- Therapeutic activity, 97112- Neuromuscular re-education, 97535- Self Care, 02859- Manual therapy, [id number]- Gait training, [phone]- Orthotic Fit/training, [phone]- Canalith repositioning, [id number]- Aquatic Therapy, 97760- Splinting, [id number]- Wound care (first 20 sq cm), 97598- Wound care (each additional 20 sq cm)Patient/Family education, Balance training, Stair training, Taping, Dry Needling, Joint mobilization, Joint manipulation, Spinal manipulation, Spinal mobilization, Scar mobilization, and DME instructions.   PLAN FOR NEXT  SESSION: Review goals  7:28 AM, 07/12/2024 Alcus Bradly Small Lenix Kidd MPT [facility] [place] [phone] [phone]  "

## 2024-07-12 ENCOUNTER — Ambulatory Visit (HOSPITAL_COMMUNITY)

## 2024-07-12 ENCOUNTER — Encounter (HOSPITAL_COMMUNITY): Payer: Self-pay

## 2024-07-14 ENCOUNTER — Other Ambulatory Visit: Payer: Self-pay | Admitting: Family

## 2024-07-14 DIAGNOSIS — E1169 Type 2 diabetes mellitus with other specified complication: Secondary | ICD-10-CM

## 2024-07-14 DIAGNOSIS — R42 Dizziness and giddiness: Secondary | ICD-10-CM

## 2024-07-16 LAB — OPHTHALMOLOGY REPORT-SCANNED

## 2024-07-26 ENCOUNTER — Ambulatory Visit: Payer: Self-pay | Admitting: Family

## 2024-08-03 ENCOUNTER — Ambulatory Visit: Admitting: Family

## 2024-08-03 ENCOUNTER — Encounter: Payer: Self-pay | Admitting: Family

## 2024-08-03 VITALS — BP 108/71 | HR 87 | Temp 97.5°F | Ht 62.0 in | Wt 127.0 lb

## 2024-08-03 DIAGNOSIS — E785 Hyperlipidemia, unspecified: Secondary | ICD-10-CM

## 2024-08-03 DIAGNOSIS — E1169 Type 2 diabetes mellitus with other specified complication: Secondary | ICD-10-CM

## 2024-08-03 DIAGNOSIS — F411 Generalized anxiety disorder: Secondary | ICD-10-CM

## 2024-08-03 DIAGNOSIS — G620 Drug-induced polyneuropathy: Secondary | ICD-10-CM | POA: Diagnosis not present

## 2024-08-03 DIAGNOSIS — E039 Hypothyroidism, unspecified: Secondary | ICD-10-CM | POA: Diagnosis not present

## 2024-08-03 DIAGNOSIS — I152 Hypertension secondary to endocrine disorders: Secondary | ICD-10-CM

## 2024-08-03 DIAGNOSIS — K7460 Unspecified cirrhosis of liver: Secondary | ICD-10-CM | POA: Diagnosis not present

## 2024-08-03 DIAGNOSIS — R42 Dizziness and giddiness: Secondary | ICD-10-CM | POA: Diagnosis not present

## 2024-08-03 DIAGNOSIS — F02A0 Dementia in other diseases classified elsewhere, mild, without behavioral disturbance, psychotic disturbance, mood disturbance, and anxiety: Secondary | ICD-10-CM

## 2024-08-03 DIAGNOSIS — E1159 Type 2 diabetes mellitus with other circulatory complications: Secondary | ICD-10-CM | POA: Diagnosis not present

## 2024-08-03 DIAGNOSIS — R296 Repeated falls: Secondary | ICD-10-CM

## 2024-08-03 DIAGNOSIS — G300 Alzheimer's disease with early onset: Secondary | ICD-10-CM | POA: Diagnosis not present

## 2024-08-03 DIAGNOSIS — E538 Deficiency of other specified B group vitamins: Secondary | ICD-10-CM

## 2024-08-03 LAB — BAYER DCA HB A1C WAIVED: HB A1C (BAYER DCA - WAIVED): 5.7 % — ABNORMAL HIGH (ref 4.8–5.6)

## 2024-08-03 MED ORDER — FUROSEMIDE 20 MG PO TABS: 10.0000 mg | ORAL_TABLET | Freq: Every day | ORAL | 0 refills | Status: AC

## 2024-08-03 MED ORDER — ESCITALOPRAM OXALATE 20 MG PO TABS: 20.0000 mg | ORAL_TABLET | Freq: Every day | ORAL | 0 refills | Status: AC

## 2024-08-03 MED ORDER — BUSPIRONE HCL 5 MG PO TABS: 5.0000 mg | ORAL_TABLET | Freq: Three times a day (TID) | ORAL | 1 refills | Status: AC | PRN

## 2024-08-03 MED ORDER — METOPROLOL SUCCINATE ER 50 MG PO TB24: 50.0000 mg | ORAL_TABLET | Freq: Every day | ORAL | 0 refills | Status: AC

## 2024-08-03 MED ORDER — DAPAGLIFLOZIN PROPANEDIOL 10 MG PO TABS: 10.0000 mg | ORAL_TABLET | Freq: Every day | ORAL | 4 refills | Status: AC

## 2024-08-03 MED ORDER — AMLODIPINE BESYLATE 5 MG PO TABS: 5.0000 mg | ORAL_TABLET | Freq: Every day | ORAL | 0 refills | Status: AC

## 2024-08-03 NOTE — Patient Instructions (Signed)
 Fall Prevention in the Home, Adult Falls can cause injuries and can happen to people of all ages. There are many things you can do to make your home safer and to help prevent falls. What actions can I take to prevent falls? General information Use good lighting in all rooms. Make sure to: Replace any light bulbs that burn out. Turn on the lights in dark areas and use night-lights. Keep items that you use often in easy-to-reach places. Lower the shelves around your home if needed. Move furniture so that there are clear paths around it. Do not use throw rugs or other things on the floor that can make you trip. If any of your floors are uneven, fix them. Add color or contrast paint or tape to clearly mark and help you see: Grab bars or handrails. First and last steps of staircases. Where the edge of each step is. If you use a ladder or stepladder: Make sure that it is fully opened. Do not climb a closed ladder. Make sure the sides of the ladder are locked in place. Have someone hold the ladder while you use it. Know where your pets are as you move through your home. What can I do in the bathroom?     Keep the floor dry. Clean up any water on the floor right away. Remove soap buildup in the bathtub or shower. Buildup makes bathtubs and showers slippery. Use non-skid mats or decals on the floor of the bathtub or shower. Attach bath mats securely with double-sided, non-slip rug tape. If you need to sit down in the shower, use a non-slip stool. Install grab bars by the toilet and in the bathtub and shower. Do not use towel bars as grab bars. What can I do in the bedroom? Make sure that you have a light by your bed that is easy to reach. Do not use any sheets or blankets on your bed that hang to the floor. Have a firm chair or bench with side arms that you can use for support when you get dressed. What can I do in the kitchen? Clean up any spills right away. If you need to reach something  above you, use a step stool with a grab bar. Keep electrical cords out of the way. Do not use floor polish or wax that makes floors slippery. What can I do with my stairs? Do not leave anything on the stairs. Make sure that you have a light switch at the top and the bottom of the stairs. Make sure that there are handrails on both sides of the stairs. Fix handrails that are broken or loose. Install non-slip stair treads on all your stairs if they do not have carpet. Avoid having throw rugs at the top or bottom of the stairs. Choose a carpet that does not hide the edge of the steps on the stairs. Make sure that the carpet is firmly attached to the stairs. Fix carpet that is loose or worn. What can I do on the outside of my home? Use bright outdoor lighting. Fix the edges of walkways and driveways and fix any cracks. Clear paths of anything that can make you trip, such as tools or rocks. Add color or contrast paint or tape to clearly mark and help you see anything that might make you trip as you walk through a door, such as a raised step or threshold. Trim any bushes or trees on paths to your home. Check to see if handrails are loose  or broken and that both sides of all steps have handrails. Install guardrails along the edges of any raised decks and porches. Have leaves, snow, or ice cleared regularly. Use sand, salt, or ice melter on paths if you live where there is ice and snow during the winter. Clean up any spills in your garage right away. This includes grease or oil spills. What other actions can I take? Review your medicines with your doctor. Some medicines can cause dizziness or changes in blood pressure, which increase your risk of falling. Wear shoes that: Have a low heel. Do not wear high heels. Have rubber bottoms and are closed at the toe. Feel good on your feet and fit well. Use tools that help you move around if needed. These include: Canes. Walkers. Scooters. Crutches. Ask  your doctor what else you can do to help prevent falls. This may include seeing a physical therapist to learn to do exercises to move better and get stronger. Where to find more information Centers for Disease Control and Prevention, [employer]: TonerPromos.no [employer] on Aging: BaseRingTones.pl National Institute on Aging: BaseRingTones.pl Contact a doctor if: You are afraid of falling at home. You feel weak, drowsy, or dizzy at home. You fall at home. Get help right away if you: Lose consciousness or have trouble moving after a fall. Have a fall that causes a head injury. These symptoms may be an emergency. Get help right away. Call 911. Do not wait to see if the symptoms will go away. Do not drive yourself to the hospital. This information is not intended to replace advice given to you by your health care provider. Make sure you discuss any questions you have with your health care provider. Document Revised: 02/18/2022 Document Reviewed: 02/18/2022 Elsevier Patient Education  2024 [employer].

## 2024-08-04 LAB — CMP14+EGFR
ALT: 16 [IU]/L (ref 0–32)
AST: 17 [IU]/L (ref 0–40)
Albumin: 4.6 g/dL (ref 3.7–4.7)
Alkaline Phosphatase: 39 [IU]/L — AB (ref 48–129)
BUN/Creatinine Ratio: 13 (ref 12–28)
BUN: 15 mg/dL (ref 8–27)
Bilirubin Total: 0.7 mg/dL (ref 0.0–1.2)
CO2: 25 mmol/L (ref 20–29)
Calcium: 10.3 mg/dL (ref 8.7–10.3)
Chloride: 100 mmol/L (ref 96–106)
Creatinine, Ser: 1.15 mg/dL — AB (ref 0.57–1.00)
Globulin, Total: 2.3 g/dL (ref 1.5–4.5)
Glucose: 62 mg/dL — AB (ref 70–99)
Potassium: 3.9 mmol/L (ref 3.5–5.2)
Sodium: 143 mmol/L (ref 134–144)
Total Protein: 6.9 g/dL (ref 6.0–8.5)
eGFR: 48 mL/min/{1.73_m2} — AB

## 2024-08-04 LAB — CBC WITH DIFFERENTIAL/PLATELET
Basophils Absolute: 0.1 10*3/uL (ref 0.0–0.2)
Basos: 1 %
EOS (ABSOLUTE): 0.2 10*3/uL (ref 0.0–0.4)
Eos: 2 %
Hematocrit: 44.9 % (ref 34.0–46.6)
Hemoglobin: 15.1 g/dL (ref 11.1–15.9)
Immature Grans (Abs): 0 10*3/uL (ref 0.0–0.1)
Immature Granulocytes: 0 %
Lymphocytes Absolute: 3.4 10*3/uL — ABNORMAL HIGH (ref 0.7–3.1)
Lymphs: 40 %
MCH: 33.3 pg — ABNORMAL HIGH (ref 26.6–33.0)
MCHC: 33.6 g/dL (ref 31.5–35.7)
MCV: 99 fL — ABNORMAL HIGH (ref 79–97)
Monocytes Absolute: 0.5 10*3/uL (ref 0.1–0.9)
Monocytes: 5 %
Neutrophils Absolute: 4.4 10*3/uL (ref 1.4–7.0)
Neutrophils: 52 %
Platelets: 226 10*3/uL (ref 150–450)
RBC: 4.53 x10E6/uL (ref 3.77–5.28)
RDW: 12.7 % (ref 11.7–15.4)
WBC: 8.5 10*3/uL (ref 3.4–10.8)

## 2024-10-14 ENCOUNTER — Ambulatory Visit: Admitting: Family

## 2025-02-15 ENCOUNTER — Ambulatory Visit: Payer: Self-pay
